# Patient Record
Sex: Female | Born: 1945 | Race: White | Hispanic: No | Marital: Married | Smoking: Never smoker
Health system: Southern US, Community
[De-identification: ages and names within clinical notes are randomized; demographics above are authoritative.]

## PROBLEM LIST (undated history)

## (undated) DIAGNOSIS — F0390 Unspecified dementia without behavioral disturbance: Secondary | ICD-10-CM

## (undated) DIAGNOSIS — I6350 Cerebral infarction due to unspecified occlusion or stenosis of unspecified cerebral artery: Secondary | ICD-10-CM

## (undated) DIAGNOSIS — Q691 Accessory thumb(s): Secondary | ICD-10-CM

## (undated) DIAGNOSIS — G40909 Epilepsy, unspecified, not intractable, without status epilepticus: Secondary | ICD-10-CM

## (undated) DIAGNOSIS — E785 Hyperlipidemia, unspecified: Secondary | ICD-10-CM

## (undated) DIAGNOSIS — R310 Gross hematuria: Secondary | ICD-10-CM

## (undated) DIAGNOSIS — G919 Hydrocephalus, unspecified: Secondary | ICD-10-CM

## (undated) DIAGNOSIS — R000 Tachycardia, unspecified: Secondary | ICD-10-CM

## (undated) DIAGNOSIS — Q759 Congenital malformation of skull and face bones, unspecified: Secondary | ICD-10-CM

## (undated) DIAGNOSIS — Z9189 Other specified personal risk factors, not elsewhere classified: Secondary | ICD-10-CM

## (undated) DIAGNOSIS — I519 Heart disease, unspecified: Secondary | ICD-10-CM

## (undated) DIAGNOSIS — E119 Type 2 diabetes mellitus without complications: Secondary | ICD-10-CM

## (undated) DIAGNOSIS — F0280 Dementia in other diseases classified elsewhere without behavioral disturbance: Secondary | ICD-10-CM

## (undated) DIAGNOSIS — Z8619 Personal history of other infectious and parasitic diseases: Secondary | ICD-10-CM

## (undated) DIAGNOSIS — Z8673 Personal history of transient ischemic attack (TIA), and cerebral infarction without residual deficits: Secondary | ICD-10-CM

## (undated) DIAGNOSIS — I456 Pre-excitation syndrome: Secondary | ICD-10-CM

## (undated) DIAGNOSIS — E871 Hypo-osmolality and hyponatremia: Secondary | ICD-10-CM

## (undated) HISTORY — DX: Personal history of other infectious and parasitic diseases: Z86.19

## (undated) HISTORY — DX: Accessory thumb(s): Q69.1

## (undated) HISTORY — DX: Congenital malformation of skull and face bones, unspecified: Q75.9

## (undated) HISTORY — DX: Pre-excitation syndrome: I45.6

## (undated) HISTORY — DX: Unspecified dementia, unspecified severity, without behavioral disturbance, psychotic disturbance, mood disturbance, and anxiety: F03.90

## (undated) HISTORY — DX: Heart disease, unspecified: I51.9

## (undated) HISTORY — DX: Other specified personal risk factors, not elsewhere classified: Z91.89

## (undated) HISTORY — DX: Type 2 diabetes mellitus without complications: E11.9

## (undated) NOTE — *Deleted (*Deleted)
Dementia Caregiver Guide Dementia is a term used to describe a number of symptoms that affect memory and thinking. The most common symptoms include:  Memory loss.  Trouble with language and communication.  Trouble concentrating.  Poor judgment.  Problems with reasoning.  Child-like behavior and language.  Extreme anxiety.  Angry outbursts.  Wandering from home or public places. Dementia usually gets worse slowly over time. In the early stages, people with dementia can stay independent and safe with some help. In later stages, they need help with daily tasks such as dressing, grooming, and using the bathroom. How to help the person with dementia cope Dementia can be frightening and confusing. Here are some tips to help the person with dementia cope with changes caused by the disease. General tips  Keep the person on track with his or her routine.  Try to identify areas where the person may need help.  Be supportive, patient, calm, and encouraging.  Gently remind the person that adjusting to changes takes time.  Help with the tasks that the person has asked for help with.  Keep the person involved in daily tasks and decisions as much as possible.  Encourage conversation, but try not to get frustrated or harried if the person struggles to find words or does not seem to appreciate your help. Communication tips  When the person is talking or seems frustrated, make eye contact and hold the person's hand.  Ask specific questions that need yes or no answers.  Use simple words, short sentences, and a calm voice. Only give one direction at a time.  When offering choices, limit them to just 1 or 2.  Avoid correcting the person in a negative way.  If the person is struggling to find the right words, gently try to help him or her. How to recognize symptoms of stress Symptoms of stress in caregivers include:  Feeling frustrated or angry with the person with dementia.   Denying that the person has dementia or that his or her symptoms will not improve.  Feeling hopeless and unappreciated.  Difficulty sleeping.  Difficulty concentrating.  Feeling anxious, irritable, or depressed.  Developing stress-related health problems.  Feeling like you have too little time for your own life. Follow these instructions at home:   Make sure that you and the person you are caring for: ? Get regular sleep. ? Exercise regularly. ? Eat regular, nutritious meals. ? Drink enough fluid to keep your urine clear or pale yellow. ? Take over-the-counter and prescription medicines only as told by your health care providers. ? Attend all scheduled health care appointments.  Join a support group with others who are caregivers.  Ask about respite care resources so that you can have a regular break from the stress of caregiving.  Look for signs of stress in yourself and in the person you are caring for. If you notice signs of stress, take steps to manage it.  Consider any safety risks and take steps to avoid them.  Organize medications in a pill box for each day of the week.  Create a plan to handle any legal or financial matters. Get legal or financial advice if needed.  Keep a calendar in a central location to remind the person of appointments or other activities. Tips for reducing the risk of injury  Keep floors clear of clutter. Remove rugs, magazine racks, and floor lamps.  Keep hallways well lit, especially at night.  Put a handrail and nonslip mat in the  bathtub or shower.  Put childproof locks on cabinets that contain dangerous items, such as medicines, alcohol, guns, toxic cleaning items, sharp tools or utensils, matches, and lighters.  Put the locks in places where the person cannot see or reach them easily. This will help ensure that the person does not wander out of the house and get lost.  Be prepared for emergencies. Keep a list of emergency phone  numbers and addresses in a convenient area.  Remove car keys and lock garage doors so that the person does not try to get in the car and drive.  Have the person wear a bracelet that tracks locations and identifies the person as having memory problems. This should be worn at all times for safety. Where to find support: Many individuals and organizations offer support. These include:  Support groups for people with dementia and for caregivers.  Counselors or therapists.  Home health care services.  Adult day care centers. Where to find more information Alzheimer[employer] Association: [employer] Contact a health care provider if:  The person's health is rapidly getting worse.  You are no longer able to care for the person.  Caring for the person is affecting your physical and emotional health.  The person threatens himself or herself, you, or anyone else. Summary  Dementia is a term used to describe a number of symptoms that affect memory and thinking.  Dementia usually gets worse slowly over time.  Take steps to reduce the person's risk of injury, and to plan for future care.  Caregivers need support, relief from caregiving, and time for their own lives. This information is not intended to replace advice given to you by your health care provider. Make sure you discuss any questions you have with your health care provider. Document Revised: 06/23/2017 Document Reviewed: 06/14/2016 Elsevier Patient Education  2020 [employer].    Seizure, Adult A seizure is a sudden burst of abnormal electrical activity in the brain. Seizures usually last from 30 seconds to 2 minutes. They can cause many different symptoms. Usually, seizures are not harmful unless they last a long time. What are the causes? Common causes of this condition include:  Fever or infection.  Conditions that affect the brain, such as: ? A brain abnormality that you were born with. ? A brain or head injury. ?  Bleeding in the brain. ? A tumor. ? Stroke. ? Brain disorders such as autism or cerebral palsy.  Low blood sugar.  Conditions that are passed from parent to child (are inherited).  Problems with substances, such as: ? Having a reaction to a drug or a medicine. ? Suddenly stopping the use of a substance (withdrawal). In some cases, the cause may not be known. A person who has repeated seizures over time without a clear cause has a condition called epilepsy. What increases the risk? You are more likely to get this condition if you have:  A family history of epilepsy.  Had a seizure in the past.  A brain disorder.  A history of head injury, lack of oxygen at birth, or strokes. What are the signs or symptoms? There are many types of seizures. The symptoms vary depending on the type of seizure you have. Examples of symptoms during a seizure include:  Shaking (convulsions).  Stiffness in the body.  Passing out (losing consciousness).  Head nodding.  Staring.  Not responding to sound or touch.  Loss of bladder control and bowel control. Some people have symptoms right before and right  after a seizure happens. Symptoms before a seizure may include:  Fear.  Worry (anxiety).  Feeling like you may vomit (nauseous).  Feeling like the room is spinning (vertigo).  Feeling like you saw or heard something before (dj vu).  Odd tastes or smells.  Changes in how you see. You may see flashing lights or spots. Symptoms after a seizure happens can include:  Confusion.  Sleepiness.  Headache.  Weakness on one side of the body. How is this treated? Most seizures will stop on their own in under 5 minutes. In these cases, no treatment is needed. Seizures that last longer than 5 minutes will usually need treatment. Treatment can include:  Medicines given through an IV tube.  Avoiding things that are known to cause your seizures. These can include medicines that you take  for another condition.  Medicines to treat epilepsy.  Surgery to stop the seizures. This may be needed if medicines do not help. Follow these instructions at home: Medicines  Take over-the-counter and prescription medicines only as told by your doctor.  Do not eat or drink anything that may keep your medicine from working, such as alcohol. Activity  Do not do any activities that would be dangerous if you had another seizure, like driving or swimming. Wait until your doctor says it is safe for you to do them.  If you live in the [place] ask your local DMV (department of motor vehicles) when you can drive.  Get plenty of rest. Teaching others Teach friends and family what to do when you have a seizure. They should:  Lay you on the ground.  Protect your head and body.  Loosen any tight clothing around your neck.  Turn you on your side.  Not hold you down.  Not put anything into your mouth.  Know whether or not you need emergency care.  Stay with you until you are better.  General instructions  Contact your doctor each time you have a seizure.  Avoid anything that gives you seizures.  Keep a seizure diary. Write down: ? What you think caused each seizure. ? What you remember about each seizure.  Keep all follow-up visits as told by your doctor. This is important. Contact a doctor if:  You have another seizure.  You have seizures more often.  There is any change in what happens during your seizures.  You keep having seizures with treatment.  You have symptoms of being sick or having an infection. Get help right away if:  You have a seizure that: ? Lasts longer than 5 minutes. ? Is different than seizures you had before. ? Makes it harder to breathe. ? Happens after you hurt your head.  You have any of these symptoms after a seizure: ? Not being able to speak. ? Not being able to use a part of your body. ? Confusion. ? A bad headache.  You have two or  more seizures in a row.  You do not wake up right after a seizure.  You get hurt during a seizure. These symptoms may be an emergency. Do not wait to see if the symptoms will go away. Get medical help right away. Call your local emergency services (911 in the [place] Do not drive yourself to the hospital. Summary  Seizures usually last from 30 seconds to 2 minutes. Usually, they are not harmful unless they last a long time.  Do not eat or drink anything that may keep your medicine from working, such as  alcohol.  Teach friends and family what to do when you have a seizure.  Contact your doctor each time you have a seizure. This information is not intended to replace advice given to you by your health care provider. Make sure you discuss any questions you have with your health care provider. Document Revised: 09/28/2018 Document Reviewed: 09/28/2018 Elsevier Patient Education  2020 [employer].

---

## 1976-07-25 HISTORY — PX: CHOLECYSTECTOMY: SHX55

## 2010-07-25 LAB — HM PAP SMEAR: HM Pap smear: NORMAL

## 2011-04-15 ENCOUNTER — Inpatient Hospital Stay (INDEPENDENT_AMBULATORY_CARE_PROVIDER_SITE_OTHER)
Admission: RE | Admit: 2011-04-15 | Discharge: 2011-04-15 | Disposition: A | Discharge status: Home / Self Care | Payer: Medicare Other | Source: Ambulatory Visit | Attending: Family Medicine | Admitting: Family Medicine

## 2011-04-15 DIAGNOSIS — S60459A Superficial foreign body of unspecified finger, initial encounter: Secondary | ICD-10-CM

## 2011-06-07 ENCOUNTER — Ambulatory Visit
Admission: RE | Admit: 2011-06-07 | Discharge: 2011-06-07 | Disposition: A | Discharge status: Home / Self Care | Payer: Medicare Other | Source: Ambulatory Visit | Attending: Internal Medicine | Admitting: Internal Medicine

## 2011-06-07 ENCOUNTER — Other Ambulatory Visit: Payer: Self-pay | Admitting: Internal Medicine

## 2011-06-07 DIAGNOSIS — R0781 Pleurodynia: Secondary | ICD-10-CM

## 2012-01-23 ENCOUNTER — Other Ambulatory Visit: Payer: Self-pay | Admitting: Internal Medicine

## 2012-01-23 DIAGNOSIS — Z1231 Encounter for screening mammogram for malignant neoplasm of breast: Secondary | ICD-10-CM

## 2012-01-23 LAB — HM MAMMOGRAPHY: HM Mammogram: NORMAL

## 2012-02-02 ENCOUNTER — Ambulatory Visit
Admission: RE | Admit: 2012-02-02 | Discharge: 2012-02-02 | Disposition: A | Discharge status: Home / Self Care | Payer: Medicare Other | Source: Ambulatory Visit | Attending: Internal Medicine | Admitting: Internal Medicine

## 2012-02-02 DIAGNOSIS — Z1231 Encounter for screening mammogram for malignant neoplasm of breast: Secondary | ICD-10-CM

## 2012-03-20 ENCOUNTER — Other Ambulatory Visit: Payer: Self-pay | Admitting: Internal Medicine

## 2012-03-20 DIAGNOSIS — W19XXXA Unspecified fall, initial encounter: Secondary | ICD-10-CM

## 2012-03-22 ENCOUNTER — Ambulatory Visit
Admission: RE | Admit: 2012-03-22 | Discharge: 2012-03-22 | Disposition: A | Discharge status: Home / Self Care | Payer: Medicare Other | Source: Ambulatory Visit | Attending: Internal Medicine | Admitting: Internal Medicine

## 2012-03-22 DIAGNOSIS — W19XXXA Unspecified fall, initial encounter: Secondary | ICD-10-CM

## 2012-05-08 ENCOUNTER — Other Ambulatory Visit: Payer: Self-pay | Admitting: Neurosurgery

## 2012-05-08 DIAGNOSIS — G919 Hydrocephalus, unspecified: Secondary | ICD-10-CM

## 2012-05-14 ENCOUNTER — Ambulatory Visit
Admission: RE | Admit: 2012-05-14 | Discharge: 2012-05-14 | Disposition: A | Discharge status: Home / Self Care | Payer: Medicare Other | Source: Ambulatory Visit | Attending: Neurosurgery | Admitting: Neurosurgery

## 2012-05-14 DIAGNOSIS — G919 Hydrocephalus, unspecified: Secondary | ICD-10-CM

## 2013-02-28 ENCOUNTER — Other Ambulatory Visit: Payer: Self-pay | Admitting: Internal Medicine

## 2013-02-28 DIAGNOSIS — Z1231 Encounter for screening mammogram for malignant neoplasm of breast: Secondary | ICD-10-CM

## 2013-03-21 ENCOUNTER — Ambulatory Visit: Payer: Medicare Other

## 2013-05-03 ENCOUNTER — Ambulatory Visit: Payer: Medicare Other

## 2013-07-05 ENCOUNTER — Emergency Department (HOSPITAL_COMMUNITY)
Admission: EM | Admit: 2013-07-05 | Discharge: 2013-07-05 | Disposition: A | Discharge status: Home / Self Care | Payer: Medicare PPO | Attending: Emergency Medicine | Admitting: Emergency Medicine

## 2013-07-05 ENCOUNTER — Encounter (HOSPITAL_COMMUNITY): Payer: Self-pay | Admitting: Emergency Medicine

## 2013-07-05 ENCOUNTER — Emergency Department (HOSPITAL_COMMUNITY): Payer: Medicare PPO

## 2013-07-05 DIAGNOSIS — Y92410 Unspecified street and highway as the place of occurrence of the external cause: Secondary | ICD-10-CM | POA: Diagnosis not present

## 2013-07-05 DIAGNOSIS — M549 Dorsalgia, unspecified: Secondary | ICD-10-CM | POA: Insufficient documentation

## 2013-07-05 DIAGNOSIS — IMO0002 Reserved for concepts with insufficient information to code with codable children: Secondary | ICD-10-CM | POA: Diagnosis present

## 2013-07-05 DIAGNOSIS — Y9389 Activity, other specified: Secondary | ICD-10-CM | POA: Insufficient documentation

## 2013-07-05 DIAGNOSIS — R0789 Other chest pain: Secondary | ICD-10-CM

## 2013-07-05 HISTORY — DX: Tachycardia, unspecified: R00.0

## 2013-07-05 HISTORY — DX: Hydrocephalus, unspecified: G91.9

## 2013-07-05 MED ORDER — OXYCODONE-ACETAMINOPHEN 5-325 MG PO TABS
1.0000 | ORAL_TABLET | Freq: Once | ORAL | Status: AC
  Administered 2013-07-05: 1 via ORAL
  Filled 2013-07-05: qty 1

## 2013-07-05 MED ORDER — DIAZEPAM 2 MG PO TABS
2.0000 mg | ORAL_TABLET | Freq: Once | ORAL | Status: AC
  Administered 2013-07-05: 2 mg via ORAL
  Filled 2013-07-05: qty 1

## 2013-07-05 NOTE — ED Notes (Signed)
The tech ambulated the patient (1x) assist. The patient gait was steady. The patient c/o of pain in the right side and back; while ambulating. The tech has reported to the RN in charge.
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
## 2013-07-06 NOTE — ED Provider Notes (Signed)
Medical screening examination/treatment/procedure(s) were performed by non-physician practitioner and as supervising physician I was immediately available for consultation/collaboration.  EKG Interpretation   None         Candyce Churn, MD 07/06/13 1215

## 2013-07-09 ENCOUNTER — Ambulatory Visit: Payer: Medicare Other

## 2013-08-04 ENCOUNTER — Observation Stay (HOSPITAL_COMMUNITY): Payer: Medicare PPO

## 2013-08-04 ENCOUNTER — Inpatient Hospital Stay (HOSPITAL_COMMUNITY)
Admission: EM | Admit: 2013-08-04 | Discharge: 2013-08-06 | DRG: 101 | Disposition: A | Discharge status: Home / Self Care | Payer: Medicare PPO | Attending: Internal Medicine | Admitting: Internal Medicine

## 2013-08-04 ENCOUNTER — Encounter (HOSPITAL_COMMUNITY): Payer: Self-pay | Admitting: Emergency Medicine

## 2013-08-04 ENCOUNTER — Emergency Department (HOSPITAL_COMMUNITY): Payer: Medicare PPO

## 2013-08-04 DIAGNOSIS — E785 Hyperlipidemia, unspecified: Secondary | ICD-10-CM

## 2013-08-04 DIAGNOSIS — G40909 Epilepsy, unspecified, not intractable, without status epilepticus: Secondary | ICD-10-CM | POA: Diagnosis present

## 2013-08-04 DIAGNOSIS — Q039 Congenital hydrocephalus, unspecified: Secondary | ICD-10-CM

## 2013-08-04 DIAGNOSIS — E119 Type 2 diabetes mellitus without complications: Secondary | ICD-10-CM

## 2013-08-04 DIAGNOSIS — I2510 Atherosclerotic heart disease of native coronary artery without angina pectoris: Secondary | ICD-10-CM | POA: Diagnosis present

## 2013-08-04 DIAGNOSIS — E1169 Type 2 diabetes mellitus with other specified complication: Secondary | ICD-10-CM

## 2013-08-04 DIAGNOSIS — E669 Obesity, unspecified: Secondary | ICD-10-CM | POA: Diagnosis present

## 2013-08-04 DIAGNOSIS — I10 Essential (primary) hypertension: Secondary | ICD-10-CM | POA: Diagnosis present

## 2013-08-04 DIAGNOSIS — E871 Hypo-osmolality and hyponatremia: Secondary | ICD-10-CM | POA: Diagnosis present

## 2013-08-04 DIAGNOSIS — Z8249 Family history of ischemic heart disease and other diseases of the circulatory system: Secondary | ICD-10-CM

## 2013-08-04 DIAGNOSIS — R569 Unspecified convulsions: Principal | ICD-10-CM | POA: Diagnosis present

## 2013-08-04 HISTORY — DX: Epilepsy, unspecified, not intractable, without status epilepticus: G40.909

## 2013-08-04 HISTORY — DX: Hyperlipidemia, unspecified: E78.5

## 2013-08-04 HISTORY — DX: Type 2 diabetes mellitus without complications: E11.9

## 2013-08-04 LAB — CBC
HCT: 40.8 % (ref 36.0–46.0)
Hemoglobin: 13.7 g/dL (ref 12.0–15.0)
MCH: 29.5 pg (ref 26.0–34.0)
MCHC: 33.6 g/dL (ref 30.0–36.0)
MCV: 87.9 fL (ref 78.0–100.0)
Platelets: 296 10*3/uL (ref 150–400)
RBC: 4.64 MIL/uL (ref 3.87–5.11)
RDW: 12.6 % (ref 11.5–15.5)
WBC: 8.6 10*3/uL (ref 4.0–10.5)

## 2013-08-04 LAB — URINALYSIS W MICROSCOPIC + REFLEX CULTURE
Bilirubin Urine: NEGATIVE
Glucose, UA: NEGATIVE mg/dL
Hgb urine dipstick: NEGATIVE
Ketones, ur: NEGATIVE mg/dL
Leukocytes, UA: NEGATIVE
Nitrite: NEGATIVE
Protein, ur: NEGATIVE mg/dL
Specific Gravity, Urine: 1.01 (ref 1.005–1.030)
Urine-Other: NONE SEEN
Urobilinogen, UA: 0.2 mg/dL (ref 0.0–1.0)
pH: 6.5 (ref 5.0–8.0)

## 2013-08-04 LAB — GLUCOSE, CAPILLARY
Glucose-Capillary: 97 mg/dL (ref 70–99)
Glucose-Capillary: 98 mg/dL (ref 70–99)

## 2013-08-04 LAB — COMPREHENSIVE METABOLIC PANEL
ALT: 31 U/L (ref 0–35)
AST: 25 U/L (ref 0–37)
Albumin: 4.6 g/dL (ref 3.5–5.2)
Alkaline Phosphatase: 84 U/L (ref 39–117)
BUN: 9 mg/dL (ref 6–23)
CO2: 21 mEq/L (ref 19–32)
Calcium: 9.8 mg/dL (ref 8.4–10.5)
Chloride: 93 mEq/L — ABNORMAL LOW (ref 96–112)
Creatinine, Ser: 0.72 mg/dL (ref 0.50–1.10)
GFR calc Af Amer: >90 mL/min (ref >90)
GFR calc non Af Amer: 86 mL/min — ABNORMAL LOW (ref >90)
Glucose, Bld: 109 mg/dL — ABNORMAL HIGH (ref 70–99)
Potassium: 4.4 mEq/L (ref 3.7–5.3)
Sodium: 134 mEq/L — ABNORMAL LOW (ref 137–147)
Total Bilirubin: 0.4 mg/dL (ref 0.3–1.2)
Total Protein: 8 g/dL (ref 6.0–8.3)

## 2013-08-04 LAB — DIGOXIN LEVEL: Digoxin Level: 1 ng/mL (ref 0.8–2.0)

## 2013-08-04 MED ORDER — ONDANSETRON HCL 4 MG PO TABS: 4.0000 mg | ORAL_TABLET | Freq: Four times a day (QID) | ORAL | Status: DC | PRN

## 2013-08-04 MED ORDER — ACETAMINOPHEN 650 MG RE SUPP: 650.0000 mg | Freq: Four times a day (QID) | RECTAL | Status: DC | PRN

## 2013-08-04 MED ORDER — SODIUM CHLORIDE 0.9 % IV BOLUS (SEPSIS)
500.0000 mL | Freq: Once | INTRAVENOUS | Status: AC
  Administered 2013-08-04: 500 mL via INTRAVENOUS

## 2013-08-04 MED ORDER — PANTOPRAZOLE SODIUM 40 MG PO TBEC: 40.0000 mg | DELAYED_RELEASE_TABLET | Freq: Every day | ORAL | Status: DC

## 2013-08-04 MED ORDER — MIRABEGRON ER 50 MG PO TB24
50.0000 mg | ORAL_TABLET | Freq: Every day | ORAL | Status: DC
  Administered 2013-08-05: 50 mg via ORAL
  Filled 2013-08-04 (×3): qty 1

## 2013-08-04 MED ORDER — LEVETIRACETAM 500 MG PO TABS
500.0000 mg | ORAL_TABLET | Freq: Two times a day (BID) | ORAL | Status: DC
  Filled 2013-08-04: qty 1

## 2013-08-04 MED ORDER — ATORVASTATIN CALCIUM 40 MG PO TABS
40.0000 mg | ORAL_TABLET | Freq: Every evening | ORAL | Status: DC
  Administered 2013-08-05: 40 mg via ORAL
  Filled 2013-08-04 (×3): qty 1

## 2013-08-04 MED ORDER — ONDANSETRON HCL 4 MG/2ML IJ SOLN: 4.0000 mg | Freq: Four times a day (QID) | INTRAMUSCULAR | Status: DC | PRN

## 2013-08-04 MED ORDER — SODIUM CHLORIDE 0.9 % IJ SOLN
3.0000 mL | Freq: Two times a day (BID) | INTRAMUSCULAR | Status: DC
  Administered 2013-08-04 – 2013-08-06 (×4): 3 mL via INTRAVENOUS

## 2013-08-04 MED ORDER — SODIUM CHLORIDE 0.9 % IV SOLN
1000.0000 mg | Freq: Once | INTRAVENOUS | Status: AC
  Administered 2013-08-04: 1000 mg via INTRAVENOUS
  Filled 2013-08-04: qty 10

## 2013-08-04 MED ORDER — SODIUM CHLORIDE 0.9 % IV SOLN
500.0000 mg | Freq: Two times a day (BID) | INTRAVENOUS | Status: DC
  Administered 2013-08-04 – 2013-08-05 (×2): 500 mg via INTRAVENOUS
  Filled 2013-08-04 (×3): qty 5

## 2013-08-04 MED ORDER — POLYETHYLENE GLYCOL 3350 17 G PO PACK
17.0000 g | PACK | Freq: Every day | ORAL | Status: DC | PRN
  Filled 2013-08-04: qty 1

## 2013-08-04 MED ORDER — PANTOPRAZOLE SODIUM 40 MG IV SOLR
40.0000 mg | Freq: Every day | INTRAVENOUS | Status: DC
  Administered 2013-08-04: 40 mg via INTRAVENOUS
  Filled 2013-08-04 (×3): qty 40

## 2013-08-04 MED ORDER — ACETAMINOPHEN 325 MG PO TABS
650.0000 mg | ORAL_TABLET | Freq: Four times a day (QID) | ORAL | Status: DC | PRN
Start: 2013-08-04 — End: 2013-08-06

## 2013-08-04 MED ORDER — LISINOPRIL 20 MG PO TABS
20.0000 mg | ORAL_TABLET | Freq: Every day | ORAL | Status: DC
  Administered 2013-08-05 – 2013-08-06 (×2): 20 mg via ORAL
  Filled 2013-08-04 (×2): qty 1

## 2013-08-04 MED ORDER — FESOTERODINE FUMARATE ER 8 MG PO TB24
8.0000 mg | ORAL_TABLET | Freq: Every day | ORAL | Status: DC
  Administered 2013-08-05: 8 mg via ORAL
  Filled 2013-08-04 (×3): qty 1

## 2013-08-04 MED ORDER — PROPRANOLOL HCL 10 MG PO TABS
10.0000 mg | ORAL_TABLET | Freq: Two times a day (BID) | ORAL | Status: DC
  Administered 2013-08-05 – 2013-08-06 (×3): 10 mg via ORAL
  Filled 2013-08-04 (×5): qty 1

## 2013-08-04 MED ORDER — DIGOXIN 0.25 MG/ML IJ SOLN
0.1250 mg | Freq: Every day | INTRAMUSCULAR | Status: DC
  Filled 2013-08-04: qty 0.5

## 2013-08-04 MED ORDER — DIGOXIN 125 MCG PO TABS: 0.1250 mg | ORAL_TABLET | Freq: Every day | ORAL | Status: DC

## 2013-08-04 MED ORDER — LORAZEPAM 2 MG/ML IJ SOLN
1.0000 mg | Freq: Once | INTRAMUSCULAR | Status: AC
  Administered 2013-08-04: 1 mg via INTRAVENOUS

## 2013-08-04 MED ORDER — METFORMIN HCL 500 MG PO TABS
500.0000 mg | ORAL_TABLET | Freq: Every day | ORAL | Status: DC
  Filled 2013-08-04 (×2): qty 1

## 2013-08-04 NOTE — ED Notes (Signed)
[place]  Witnesses state pt began shaking like she was having a seizure and then had a syncopal episode.  Pt appears postictal.  Pt it verbal with inappropriate responses.  EMS reports pt initially not responding to them.  EMS reports pt was stiff and had eyes clinched closed.
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
## 2013-08-04 NOTE — ED Provider Notes (Signed)
CSN: [id number]     Arrival date & time 08/04/13  1046 History   First MD Initiated Contact with Patient 08/04/13 1102     Chief Complaint  Patient presents with  . Loss of Consciousness  . Seizures   (Consider location/radiation/quality/duration/timing/severity/associated sxs/prior Treatment) Patient is a 68 y.o. female presenting with syncope and seizures. The history is provided by the patient.  Loss of Consciousness Episode history:  Multiple Most recent episode:  Today Timing:  Intermittent Progression:  Unchanged Context comment:  At church Witnessed: yes   Relieved by:  Nothing Worsened by:  Nothing tried Ineffective treatments:  None tried Associated symptoms: seizures   Associated symptoms: no chest pain, no dizziness, no fever, no headaches, no nausea, no shortness of breath and no vomiting   Seizures:    Seizure activity on arrival: yes     Seizure type:  Grand mal   Severity:  Mild   Duration:  2 minutes ( seizure here, uknown duration of seizure at church)   Timing:  Once Seizures   Past Medical History  Diagnosis Date  . Hydrocephalus   . Tachycardia    Past Surgical History  Procedure Laterality Date  . Cholecystectomy  1978   No family history on file. History  Substance Use Topics  . Smoking status: Not on file  . Smokeless tobacco: Not on file  . Alcohol Use: Not on file   OB History   Grav Para Term Preterm Abortions TAB SAB Ect Mult Living                 Review of Systems  Constitutional: Negative for fever and fatigue.  HENT: Negative for congestion and drooling.   Eyes: Negative for pain.  Respiratory: Negative for cough and shortness of breath.   Cardiovascular: Positive for syncope. Negative for chest pain.  Gastrointestinal: Negative for nausea, vomiting, abdominal pain and diarrhea.  Genitourinary: Negative for dysuria and hematuria.  Musculoskeletal: Negative for back pain, gait problem and neck pain.  Skin: Negative for  color change.  Neurological: Positive for seizures. Negative for dizziness and headaches.  Hematological: Negative for adenopathy.  Psychiatric/Behavioral: Negative for behavioral problems.  All other systems reviewed and are negative.    Allergies  Review of patient's allergies indicates no known allergies.  Home Medications   Current Outpatient Rx  Name  Route  Sig  Dispense  Refill  . atorvastatin (LIPITOR) 40 MG tablet               . DIGOX 125 MCG tablet               . lidocaine (XYLOCAINE) 5 % ointment               . lisinopril (PRINIVIL,ZESTRIL) 20 MG tablet               . metFORMIN (GLUCOPHAGE) 500 MG tablet               . [name] 50 MG TB24 tablet               . propranolol (INDERAL) 10 MG tablet               . [name] 8 MG TB24 tablet                BP 144/48  Pulse 85  Temp(Src) 98.5 F (36.9 C) (Axillary)  Resp 13  SpO2 95% Physical Exam  Nursing note and vitals reviewed. Constitutional: She  appears well-developed and well-nourished.  HENT:  Head: Normocephalic.  Mouth/Throat: Oropharynx is clear and moist. No oropharyngeal exudate.  Eyes: Conjunctivae and EOM are normal. Pupils are equal, round, and reactive to light.  Neck: Normal range of motion. Neck supple.  Cardiovascular: Normal rate, regular rhythm, normal heart sounds and intact distal pulses.  Exam reveals no gallop and no friction rub.   No murmur heard. Pulmonary/Chest: Effort normal and breath sounds normal. No respiratory distress. She has no wheezes.  Abdominal: Soft. Bowel sounds are normal. There is no tenderness. There is no rebound and no guarding.  Musculoskeletal: Normal range of motion. She exhibits no edema and no tenderness.  6 digits on right hand.   Neurological:  Patient is drowsy on exam and will not follow commands. She is confused verbal responses. She appears to be moving all extremities.  Skin: Skin is warm and dry.  Psychiatric:  She has a normal mood and affect. Her behavior is normal.    ED Course  Procedures (including critical care time) Labs Review Labs Reviewed  COMPREHENSIVE METABOLIC PANEL - Abnormal; Notable for the following:    Sodium 134 (*)    Chloride 93 (*)    Glucose, Bld 109 (*)    GFR calc non Af Amer 86 (*)    All other components within normal limits  CBC - Abnormal; Notable for the following:    HCT 35.1 (*)    All other components within normal limits  BASIC METABOLIC PANEL - Abnormal; Notable for the following:    GFR calc non Af Amer 89 (*)    All other components within normal limits  CBC  DIGOXIN LEVEL  URINALYSIS W MICROSCOPIC + REFLEX CULTURE  GLUCOSE, CAPILLARY  TSH  GLUCOSE, CAPILLARY  GLUCOSE, CAPILLARY  GLUCOSE, CAPILLARY  HEMOGLOBIN A1C   Imaging Review Ct Head Wo Contrast  08/04/2013   CLINICAL DATA:  68 year old female with seizure and syncope.  EXAM: CT HEAD WITHOUT CONTRAST  TECHNIQUE: Contiguous axial images were obtained from the base of the skull through the vertex without intravenous contrast.  COMPARISON:  05/14/2012 MR and 03/22/2012 CT.  FINDINGS: Severe chronic hydrocephalus of the lateral and 3rd ventricles again noted. Chronic compression of the cerebral hemispheric tissue is again identified.  No acute intracranial abnormalities are identified, including mass lesion or mass effect, extra-axial fluid collection, midline shift, hemorrhage, or acute infarction. The visualized bony calvarium is unremarkable.  IMPRESSION: No evidence of acute intracranial abnormality.  Severe chronic hydrocephalus.   Electronically Signed   By: Laveda Abbe M.D.   On: 08/04/2013 11:56   Dg Chest Port 1 View  08/04/2013   CLINICAL DATA:  Possible seizure, syncope  EXAM: PORTABLE CHEST - 1 VIEW  COMPARISON:  07/05/2013  FINDINGS: Cardiomediastinal silhouette is stable. No acute infiltrate or pleural effusion. No pulmonary edema. Mild basilar atelectasis.  IMPRESSION: No acute infiltrate  or pulmonary edema.  Mild basilar atelectasis.   Electronically Signed   By: Natasha Mead M.D.   On: 08/04/2013 12:13    EKG Interpretation    Date/Time:  Sunday August 04 2013 13:05:47 EST Ventricular Rate:  100 PR Interval:  168 QRS Duration: 88 QT Interval:  288 QTC Calculation: 371 R Axis:   54 Text Interpretation:  Age not entered, assumed to be  68 years old for purpose of ECG interpretation Sinus tachycardia Inferior infarct, age indeterminate Confirmed by Daja Shuping  MD, Lashon Beringer ([id number]) on 08/04/2013 1:24:33 PM  MDM   1. Seizures   2. Congenital hydrocephalus   3. Diabetes mellitus type 2 in obese   4. Hyperlipidemia   5. Hypertension    11:20 AM 68 y.o. female with a history of hydrocephalus who presents with a seizure and possible syncopal episode. History is currently from EMS is reporting the patient had a shaking episode at church consistent with a seizure and then had a syncopal episode. EMS is reporting that the patient was postictal in route. Upon arrival she had a diffuse tonic-clonic seizure lasting less than 2 minutes which was seen by me. She got 1 mg of Ativan at that time. More detailed history is currently lacking and reportedly family members are in route. The patient is currently afebrile and vital signs are unremarkable here. The patient appears postictal on exam with confused verbal responses. She appears drowsy. Will get screening imaging and labs.  1:41 PM I discussed the case with Dr. Roseanne RenoStewart with neurology. The patient is now more active and has no focal motor or sensory deficits. She remained slightly drowsy on exam.  2:36 PM Patient continues to appear well on exam. She has some persistent perseveration which is noted by the husband. She is requesting water. Will have the RN perform a swallow screen first. [facility] and MRI.  Junius ArgyleForrest S Macyn Remmert, MD 08/05/13 1123

## 2013-08-04 NOTE — ED Notes (Signed)
CBG 97 

## 2013-08-04 NOTE — Progress Notes (Signed)
Received pt from ED approximated at 1825pm accompanied by NT in stretcher, situated pt in her room, alert and verbally responsive in no acute distress. Per Aquilla SolianGina Gauge,RN MRI hasnt been done, MD was aware and for a neurology consult, Per ED staff pt failed a swallowing test in ED.

## 2013-08-05 ENCOUNTER — Observation Stay (HOSPITAL_COMMUNITY): Payer: Medicare PPO

## 2013-08-05 LAB — BASIC METABOLIC PANEL
BUN: 8 mg/dL (ref 6–23)
CO2: 24 mEq/L (ref 19–32)
Calcium: 8.9 mg/dL (ref 8.4–10.5)
Chloride: 102 mEq/L (ref 96–112)
Creatinine, Ser: 0.66 mg/dL (ref 0.50–1.10)
GFR calc Af Amer: >90 mL/min (ref >90)
GFR calc non Af Amer: 89 mL/min — ABNORMAL LOW (ref >90)
Glucose, Bld: 97 mg/dL (ref 70–99)
Potassium: 3.8 mEq/L (ref 3.7–5.3)
Sodium: 138 mEq/L (ref 137–147)

## 2013-08-05 LAB — CBC
HCT: 35.1 % — ABNORMAL LOW (ref 36.0–46.0)
Hemoglobin: 12.3 g/dL (ref 12.0–15.0)
MCH: 29.6 pg (ref 26.0–34.0)
MCHC: 35 g/dL (ref 30.0–36.0)
MCV: 84.4 fL (ref 78.0–100.0)
Platelets: 212 10*3/uL (ref 150–400)
RBC: 4.16 MIL/uL (ref 3.87–5.11)
RDW: 12.6 % (ref 11.5–15.5)
WBC: 4.7 10*3/uL (ref 4.0–10.5)

## 2013-08-05 LAB — GLUCOSE, CAPILLARY
Glucose-Capillary: 93 mg/dL (ref 70–99)
Glucose-Capillary: 87 mg/dL (ref 70–99)
Glucose-Capillary: 154 mg/dL — ABNORMAL HIGH (ref 70–99)
Glucose-Capillary: 171 mg/dL — ABNORMAL HIGH (ref 70–99)
Glucose-Capillary: 147 mg/dL — ABNORMAL HIGH (ref 70–99)

## 2013-08-05 LAB — HEMOGLOBIN A1C
Hgb A1c MFr Bld: 6.7 % — ABNORMAL HIGH (ref <5.7)
Mean Plasma Glucose: 146 mg/dL — ABNORMAL HIGH (ref <117)

## 2013-08-05 LAB — TSH: TSH: 1.721 u[IU]/mL (ref 0.350–4.500)

## 2013-08-05 MED ORDER — LEVETIRACETAM 500 MG PO TABS
500.0000 mg | ORAL_TABLET | Freq: Two times a day (BID) | ORAL | Status: DC
Start: 2013-08-05 — End: 2013-08-06
  Administered 2013-08-05 – 2013-08-06 (×2): 500 mg via ORAL
  Filled 2013-08-05 (×3): qty 1

## 2013-08-05 MED ORDER — DIGOXIN 125 MCG PO TABS
0.1250 mg | ORAL_TABLET | Freq: Every day | ORAL | Status: DC
  Administered 2013-08-05 – 2013-08-06 (×2): 0.125 mg via ORAL
  Filled 2013-08-05 (×2): qty 1

## 2013-08-05 MED ORDER — INSULIN ASPART 100 UNIT/ML ~~LOC~~ SOLN
0.0000 [IU] | Freq: Three times a day (TID) | SUBCUTANEOUS | Status: DC
  Administered 2013-08-05 (×2): 2 [IU] via SUBCUTANEOUS
  Administered 2013-08-06: 1 [IU] via SUBCUTANEOUS

## 2013-08-05 MED ORDER — ENOXAPARIN SODIUM 40 MG/0.4ML ~~LOC~~ SOLN
40.0000 mg | SUBCUTANEOUS | Status: DC
  Administered 2013-08-05: 40 mg via SUBCUTANEOUS
  Filled 2013-08-05 (×2): qty 0.4

## 2013-08-05 MED ORDER — PANTOPRAZOLE SODIUM 40 MG PO TBEC
40.0000 mg | DELAYED_RELEASE_TABLET | Freq: Every day | ORAL | Status: DC
  Administered 2013-08-05 – 2013-08-06 (×2): 40 mg via ORAL
  Filled 2013-08-05 (×2): qty 1

## 2013-08-05 NOTE — Progress Notes (Signed)
EEG Completed; Results Pending  

## 2013-08-05 NOTE — Progress Notes (Signed)
PATIENT DETAILS Name: Leah Payne Age: 68 y.o. Sex: female Date of Birth: [birth year] Admit Date: 08/04/2013 Admitting Physician Elby Showersatherine Walsh, MD PCP:No primary provider on file.  Subjective: Completely awake and alert, does not recollect events from admission.  Assessment/Plan: Active Problems:   New onset Seizures - On admission, started on intravenous Keppra - Neurology following - Await MRI brain and EEG    Congenital hydrocephalus - Further workup in the outpatient setting    Hypertension - Controlled with lisinopril and propranolol    Diabetes mellitus type 2 in obese - CBGs stable, continue SSI - Resume metformin on discharge    Hyperlipidemia - Continue statins  Disposition: Remain inpatient  DVT Prophylaxis: Prophylactic Lovenox   Code Status: Full code   Family Communication Family friend at bedside  Procedures:  None  CONSULTS:  neurology  Time spent 40 minutes-which includes 50% of the time with face-to-face with patient and coordinating care related to the above assessment and plan.  MEDICATIONS: Scheduled Meds: . atorvastatin  40 mg Oral QPM  . digoxin  0.125 mg Oral Daily  . fesoterodine  8 mg Oral QHS  . insulin aspart  0-9 Units Subcutaneous TID WC  . levETIRAcetam  500 mg Oral BID  . lisinopril  20 mg Oral Daily  . mirabegron ER  50 mg Oral QHS  . pantoprazole  40 mg Oral Daily  . propranolol  10 mg Oral BID  . sodium chloride  3 mL Intravenous Q12H   Continuous Infusions:  PRN Meds:.acetaminophen, acetaminophen, ondansetron (ZOFRAN) IV, ondansetron, polyethylene glycol  Antibiotics: Anti-infectives   None       PHYSICAL EXAM: Vital signs in last 24 hours: Filed Vitals:   08/04/13 2124 08/05/13 0133 08/05/13 0558 08/05/13 0947  BP: 122/65 105/50 132/58 127/66  Pulse: 88 84 84 88  Temp: 98.1 F (36.7 C) 97.5 F (36.4 C) 98.2 F (36.8 C) 98 F (36.7 C)  TempSrc: Oral Oral Oral Oral  Resp: 18 16 16  16   Height:      Weight:      SpO2: 99% 98% 100% 99%    Weight change:  Filed Weights   08/04/13 1850  Weight: 66.225 kg (146 lb)   Body mass index is 26.7 kg/(m^2).   Gen Exam: Awake and alert with clear speech.   Neck: Supple, No JVD.   Chest: B/L Clear.   CVS: S1 S2 Regular, no murmurs.  Abdomen: soft, BS +, non tender, non distended.  Extremities: no edema, lower extremities warm to touch. Neurologic: Non Focal.   Skin: No Rash.   Wounds: N/A.    Intake/Output from previous day: No intake or output data in the 24 hours ending 08/05/13 1443   LAB RESULTS: CBC  Recent Labs Lab 08/04/13 1050 08/05/13 0350  WBC 8.6 4.7  HGB 13.7 12.3  HCT 40.8 35.1*  PLT 296 212  MCV 87.9 84.4  MCH 29.5 29.6  MCHC 33.6 35.0  RDW 12.6 12.6    Chemistries   Recent Labs Lab 08/04/13 1050 08/05/13 0350  NA 134* 138  K 4.4 3.8  CL 93* 102  CO2 21 24  GLUCOSE 109* 97  BUN 9 8  CREATININE 0.72 0.66  CALCIUM 9.8 8.9    CBG:  Recent Labs Lab 08/04/13 1120 08/04/13 2318 08/05/13 0705 08/05/13 0735 08/05/13 1137  GLUCAP 97 98 93 87 154*    GFR Estimated Creatinine Clearance: 60 ml/min (by C-G formula based on Cr of 0.66).  Coagulation profile No results found for this basename: INR, PROTIME,  in the last 168 hours  Cardiac Enzymes No results found for this basename: CK, CKMB, TROPONINI, MYOGLOBIN,  in the last 168 hours  No components found with this basename: POCBNP,  No results found for this basename: DDIMER,  in the last 72 hours  Recent Labs  08/05/13 0350  HGBA1C 6.7*   No results found for this basename: CHOL, HDL, LDLCALC, TRIG, CHOLHDL, LDLDIRECT,  in the last 72 hours  Recent Labs  08/05/13 0350  TSH 1.721   No results found for this basename: VITAMINB12, FOLATE, FERRITIN, TIBC, IRON, RETICCTPCT,  in the last 72 hours No results found for this basename: LIPASE, AMYLASE,  in the last 72 hours  Urine Studies No results found for this  basename: UACOL, UAPR, USPG, UPH, UTP, UGL, UKET, UBIL, UHGB, UNIT, UROB, ULEU, UEPI, UWBC, URBC, UBAC, CAST, CRYS, UCOM, BILUA,  in the last 72 hours  MICROBIOLOGY: No results found for this or any previous visit (from the past 240 hour(s)).  RADIOLOGY STUDIES/RESULTS: Ct Head Wo Contrast  08/04/2013   CLINICAL DATA:  68 year old female with seizure and syncope.  EXAM: CT HEAD WITHOUT CONTRAST  TECHNIQUE: Contiguous axial images were obtained from the base of the skull through the vertex without intravenous contrast.  COMPARISON:  05/14/2012 MR and 03/22/2012 CT.  FINDINGS: Severe chronic hydrocephalus of the lateral and 3rd ventricles again noted. Chronic compression of the cerebral hemispheric tissue is again identified.  No acute intracranial abnormalities are identified, including mass lesion or mass effect, extra-axial fluid collection, midline shift, hemorrhage, or acute infarction. The visualized bony calvarium is unremarkable.  IMPRESSION: No evidence of acute intracranial abnormality.  Severe chronic hydrocephalus.   Electronically Signed   By: Laveda Abbe M.D.   On: 08/04/2013 11:56   Dg Chest Port 1 View  08/04/2013   CLINICAL DATA:  Possible seizure, syncope  EXAM: PORTABLE CHEST - 1 VIEW  COMPARISON:  07/05/2013  FINDINGS: Cardiomediastinal silhouette is stable. No acute infiltrate or pleural effusion. No pulmonary edema. Mild basilar atelectasis.  IMPRESSION: No acute infiltrate or pulmonary edema.  Mild basilar atelectasis.   Electronically Signed   By: Natasha Mead M.D.   On: 08/04/2013 12:13    Jeoffrey Massed, MD  [facility] Pager:[phone]  If 7PM-7AM, please contact night-coverage [email or web address] Password [email or web address] 08/05/2013, 2:43 PM   LOS: 1 day

## 2013-08-05 NOTE — Progress Notes (Signed)
NEURO HOSPITALIST PROGRESS NOTE   SUBJECTIVE:                                                                                                                        No further seizures over night  OBJECTIVE:                                                                                                                           Vital signs in last 24 hours: Temp:  [97.5 F (36.4 C)-98.3 F (36.8 C)] 98 F (36.7 C) (01/12 0947) Pulse Rate:  [84-96] 88 (01/12 0947) Resp:  [16-20] 16 (01/12 0947) BP: (105-157)/(50-80) 127/66 mmHg (01/12 0947) SpO2:  [98 %-100 %] 99 % (01/12 0947) Weight:  [66.225 kg (146 lb)] 66.225 kg (146 lb) (01/11 1850)  Intake/Output from previous day:   Intake/Output this shift:   Nutritional status: General  Past Medical History  Diagnosis Date  . Hydrocephalus   . Tachycardia      Neurologic Exam:   Mental Status: Alert, oriented, thought content appropriate.  Speech fluent without evidence of aphasia.  Able to follow 3 step commands without difficulty. Cranial Nerves: II:  Visual fields grossly normal, pupils equal, round, reactive to light and accommodation III,IV, VI: ptosis not present, extra-ocular motions intact bilaterally V,VII: smile symmetric, facial light touch sensation normal bilaterally VIII: hearing normal bilaterally IX,X: gag reflex present XI: bilateral shoulder shrug XII: midline tongue extension without atrophy or fasciculations  Motor: Right : Upper extremity   5/5    Left:     Upper extremity   5/5  Lower extremity   5/5     Lower extremity   5/5 Tone and bulk:normal tone throughout; no atrophy noted Sensory: Pinprick and light touch intact throughout, bilaterally Deep Tendon Reflexes:  Right: Upper Extremity   Left: Upper extremity   biceps (C-5 to C-6) 2/4   biceps (C-5 to C-6) 2/4 tricep (C7) 2/4    triceps (C7) 2/4 Brachioradialis (C6) 2/4  Brachioradialis (C6) 2/4  Lower  Extremity Lower Extremity  quadriceps (L-2 to L-4) 2/4   quadriceps (L-2 to L-4) 2/4 Achilles (S1) 1/4   Achilles (S1) 1/4  Plantars: Right: downgoing   Left: mute  Lab Results: No results found for this basename: cbc, bmp, coags, chol, tri, ldl, hga1c   Lipid Panel No results found for this basename: CHOL, TRIG, HDL, CHOLHDL, VLDL, LDLCALC,  in the last 72 hours  Studies/Results: Ct Head Wo Contrast  08/04/2013   CLINICAL DATA:  68 year old female with seizure and syncope.  EXAM: CT HEAD WITHOUT CONTRAST  TECHNIQUE: Contiguous axial images were obtained from the base of the skull through the vertex without intravenous contrast.  COMPARISON:  05/14/2012 MR and 03/22/2012 CT.  FINDINGS: Severe chronic hydrocephalus of the lateral and 3rd ventricles again noted. Chronic compression of the cerebral hemispheric tissue is again identified.  No acute intracranial abnormalities are identified, including mass lesion or mass effect, extra-axial fluid collection, midline shift, hemorrhage, or acute infarction. The visualized bony calvarium is unremarkable.  IMPRESSION: No evidence of acute intracranial abnormality.  Severe chronic hydrocephalus.   Electronically Signed   By: Laveda Abbe M.D.   On: 08/04/2013 11:56   Dg Chest Port 1 View  08/04/2013   CLINICAL DATA:  Possible seizure, syncope  EXAM: PORTABLE CHEST - 1 VIEW  COMPARISON:  07/05/2013  FINDINGS: Cardiomediastinal silhouette is stable. No acute infiltrate or pleural effusion. No pulmonary edema. Mild basilar atelectasis.  IMPRESSION: No acute infiltrate or pulmonary edema.  Mild basilar atelectasis.   Electronically Signed   By: Natasha Mead M.D.   On: 08/04/2013 12:13    MEDICATIONS                                                                                                                        Scheduled: . atorvastatin  40 mg Oral QPM  . digoxin  0.125 mg Oral Daily  . fesoterodine  8 mg Oral QHS  . insulin aspart  0-9 Units  Subcutaneous TID WC  . levETIRAcetam  500 mg Intravenous Q12H  . lisinopril  20 mg Oral Daily  . mirabegron ER  50 mg Oral QHS  . pantoprazole  40 mg Oral Daily  . propranolol  10 mg Oral BID  . sodium chloride  3 mL Intravenous Q12H    ASSESSMENT/PLAN:                                                                                                            68 YO female with new onset seizures. No further seizures while on Keppra 500 mg BID.    Recommend: 1) MRI brain with and without contrast 2) EEG      Assessment and plan discussed with  with attending physician and they are in agreement.    Felicie Morn PA-C [facility] [phone]  08/05/2013, 12:09 PM
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
## 2013-08-05 NOTE — Progress Notes (Signed)
UR completed. Patient changed to inpatient r/t new onset seizures requiring IV anticonvulsants.

## 2013-08-05 NOTE — Procedures (Signed)
ELECTROENCEPHALOGRAM REPORT  Patient: Leah Payne       Room #: 1O104N24 EEG No. ID: [id number] Age: 68 y.o.        Sex: female Referring Physician: Ghimire Report Date:  08/05/2013        Interpreting Physician: Aline BrochureSTEWART,Leah Payne  History: Leah Payne is an 68 y.o. female history of congenital hydrocephalus who experienced 2 generalized seizures 08/04/2013.  Indications for study:  Rule out new-onset seizure disorder.  Technique: This is an 18 channel routine scalp EEG performed at the bedside with bipolar and monopolar montages arranged in accordance to the international 10/20 system of electrode placement.   Description: This EEG recording was performed during wakefulness and during sleep. Predominant background activity during wakefulness consisted of low amplitude diffuse mixed delta and theta activity diffusely. 8 Hz alpha rhythm was recorded from posterior head regions. Brief photic stimulation produced minimal symmetrical occipital driving response. Hyperventilation was not performed. During sleep to slowing of background activity diffusely and symmetrically with symmetrical vertex waves, sleep spindles and K.-complexes recorded. No epileptiform discharges were recorded during wakefulness nor during sleep.  Interpretation: EEG is abnormal with mild generalized nonspecific continuous slowing of cerebral activity. No evidence of an epileptic disorder was demonstrated.   Venetia MaxonR Kaymarie Wynn M.D. [facility] [phone]

## 2013-08-06 ENCOUNTER — Inpatient Hospital Stay (HOSPITAL_COMMUNITY): Payer: Medicare PPO

## 2013-08-06 LAB — GLUCOSE, CAPILLARY
Glucose-Capillary: 127 mg/dL — ABNORMAL HIGH (ref 70–99)
Glucose-Capillary: 134 mg/dL — ABNORMAL HIGH (ref 70–99)
Glucose-Capillary: 126 mg/dL — ABNORMAL HIGH (ref 70–99)

## 2013-08-06 MED ORDER — GADOBENATE DIMEGLUMINE 529 MG/ML IV SOLN
13.0000 mL | Freq: Once | INTRAVENOUS | Status: AC | PRN
  Administered 2013-08-06: 13 mL via INTRAVENOUS

## 2013-08-06 MED ORDER — LEVETIRACETAM 500 MG PO TABS: 500.0000 mg | ORAL_TABLET | Freq: Two times a day (BID) | ORAL | Status: DC

## 2013-08-06 NOTE — Progress Notes (Signed)
NEURO HOSPITALIST PROGRESS NOTE   SUBJECTIVE:                                                                                                                        No further seizures while on Keppra.  No SE while on Keppra.    OBJECTIVE:                                                                                                                           Vital signs in last 24 hours: Temp:  [97.8 F (36.6 C)-98.9 F (37.2 C)] 98.9 F (37.2 C) (01/13 0653) Pulse Rate:  [82-88] 88 (01/13 0653) Resp:  [16-20] 20 (01/13 0653) BP: (114-146)/(62-82) 146/82 mmHg (01/13 0653) SpO2:  [95 %-98 %] 98 % (01/13 0653)  Intake/Output from previous day:   Intake/Output this shift:   Nutritional status: General  Past Medical History  Diagnosis Date  . Hydrocephalus   . Tachycardia      Neurologic Exam:   Mental Status: Alert, oriented, thought content appropriate.  Speech fluent without evidence of aphasia.  Able to follow 3 step commands without difficulty. Cranial Nerves: II: Visual fields grossly normal, pupils equal, round, reactive to light and accommodation III,IV, VI: ptosis not present, extra-ocular motions intact bilaterally V,VII: smile symmetric, facial light touch sensation normal bilaterally VIII: hearing normal bilaterally IX,X: gag reflex present XI: bilateral shoulder shrug XII: midline tongue extension without atrophy or fasciculations  Motor: Right : Upper extremity   5/5    Left:     Upper extremity   5/5  Lower extremity   5/5     Lower extremity   5/5 Tone and bulk:normal tone throughout; no atrophy noted Sensory: Pinprick and light touch intact throughout, bilaterally Deep Tendon Reflexes:  Right: Upper Extremity   Left: Upper extremity   biceps (C-5 to C-6) 2/4   biceps (C-5 to C-6) 2/4 tricep (C7) 2/4    triceps (C7) 2/4 Brachioradialis (C6) 2/4  Brachioradialis (C6) 2/4  Lower Extremity Lower Extremity   quadriceps (L-2 to L-4) 2/4   quadriceps (L-2 to L-4) 2/4 Achilles (S1) 1/4   Achilles (S1) 1/4  Plantars: Right: downgoing   Left: mute    Lab Results: No  results found for this basename: cbc, bmp, coags, chol, tri, ldl, hga1c   Lipid Panel No results found for this basename: CHOL, TRIG, HDL, CHOLHDL, VLDL, LDLCALC,  in the last 72 hours  Studies/Results: Mr Lodema PilotBrain W Wo Contrast  08/06/2013   CLINICAL DATA:  68 year old female unresponsive episode. Initial encounter. Congenital hydrocephalus.  EXAM: MRI HEAD WITHOUT AND WITH CONTRAST  TECHNIQUE: Multiplanar, multiecho pulse sequences of the brain and surrounding structures were obtained without and with intravenous contrast.  CONTRAST:  13mL MULTIHANCE GADOBENATE DIMEGLUMINE 529 MG/ML IV SOLN  COMPARISON:  Head CT 08/04/2013. [place] [facility] brain MRI 05/14/2012. Head CT 03/22/2012.  FINDINGS: Chronic severe ventriculomegaly. Subsequently, due to the patient's head size she had to be imaged without the dedicated head coil. Stable ventricle size and configuration. As before, the fourth ventricle is diminutive. The cerebral aqueduct is diminutive. No evidence of transependymal edema.  No restricted diffusion or evidence of acute infarction. Major intracranial vascular flow voids are grossly stable.  Cerebral volume appears stable. No acute intracranial mass effect identified. No acute intracranial hemorrhage identified. Diminutive pituitary re- identified. Stable and grossly negative cervicomedullary junction and visualized cervical spine.  There is chronic mostly subcortical appearing cerebral white matter T2 and FLAIR hyperintensity, not significantly changed. Deep gray matter nuclei and posterior fossa gray and white matter signal remains within normal limits. No abnormal enhancement identified.  Bone marrow signal appears stable and within normal limits. Visualized orbit soft tissues are within normal limits. Visualized paranasal sinuses and  mastoids are clear. Negative scalp soft tissues.  IMPRESSION: 1.  No acute intracranial abnormality. 2. 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 3. Stable nonspecific cerebral white matter signal changes.   Electronically Signed   By: Augusto GambleLee  Hall M.D.   On: 08/06/2013 12:56   EEG: EEG is abnormal with mild generalized nonspecific continuous slowing of cerebral activity. No evidence of an epileptic disorder was demonstrated.  MEDICATIONS                                                                                                                        Scheduled: . atorvastatin  40 mg Oral QPM  . digoxin  0.125 mg Oral Daily  . enoxaparin (LOVENOX) injection  40 mg Subcutaneous Q24H  . fesoterodine  8 mg Oral QHS  . insulin aspart  0-9 Units Subcutaneous TID WC  . levETIRAcetam  500 mg Oral BID  . lisinopril  20 mg Oral Daily  . mirabegron ER  50 mg Oral QHS  . pantoprazole  40 mg Oral Daily  . propranolol  10 mg Oral BID  . sodium chloride  3 mL Intravenous Q12H    ASSESSMENT/PLAN:  68 YO female with new onset seizures. No further seizures while on Keppra 500 mg BID. MRI sows no intracranial abnormality and no abnormal enhancements.  EEG shows no epileptiform activity.   Recommend: 1) Continue Keppra 500 mg BID PO 2) Follow up with out patient neurology.   No further recommendations. Neurology S/O   Assessment and plan discussed with with attending physician and they are in agreement.    Felicie Morn PA-C [facility] [phone]  08/06/2013, 2:24 PM

## 2013-08-06 NOTE — Discharge Instructions (Signed)
No driving, participating in activities at heights, no participating in high-speed water sports.

## 2013-08-06 NOTE — Progress Notes (Signed)
Discharge orders received, pt for discharge home today, IV and telemetry D/C and D/C instructions and Rx given with verbalized understanding.  Family at bedside to assist pt with discharge. Staff brought pt downstairs via wheelchair.

## 2013-08-06 NOTE — Discharge Summary (Signed)
PATIENT DETAILS Name: Leah BellsMargaret L Cavness Age: 68 y.o. Sex: female Date of Birth: 06/13/1946 MRN: [id number]. Admit Date: 08/04/2013 Admitting Physician: Elby Showersatherine Walsh, MD NWG:[id number],QMVPCP:MOREIRA,ROY, MD  Recommendations for Outpatient Follow-up:  1. Started on Keppra for seizures.  PRIMARY DISCHARGE DIAGNOSIS:  Active Problems:   Seizures   Congenital hydrocephalus   Hypertension   Diabetes mellitus type 2 in obese   Hyperlipidemia   Seizure      PAST MEDICAL HISTORY: Past Medical History  Diagnosis Date  . Hydrocephalus   . Tachycardia     DISCHARGE MEDICATIONS:   Medication List         atorvastatin 40 MG tablet  Commonly known as:  LIPITOR  40 mg every evening.     DIGOX 0.125 MG tablet  Generic drug:  digoxin  0.125 mg daily.     esomeprazole 40 MG capsule  Commonly known as:  NEXIUM  Take 40 mg by mouth at bedtime.     levETIRAcetam 500 MG tablet  Commonly known as:  KEPPRA  Take 1 tablet (500 mg total) by mouth 2 (two) times daily.     lidocaine 5 % ointment  Commonly known as:  XYLOCAINE  1 application 2 (two) times daily as needed for mild pain.     lisinopril 20 MG tablet  Commonly known as:  PRINIVIL,ZESTRIL  20 mg daily.     metFORMIN 500 MG tablet  Commonly known as:  GLUCOPHAGE  500 mg daily with breakfast.     [name] 50 MG Tb24 tablet  Generic drug:  mirabegron ER  50 mg at bedtime.     naproxen sodium 220 MG tablet  Commonly known as:  ANAPROX  Take 440 mg by mouth 2 (two) times daily with a meal.     propranolol 10 MG tablet  Commonly known as:  INDERAL  10 mg 2 (two) times daily.     [name] 8 MG Tb24 tablet  Generic drug:  fesoterodine  8 mg at bedtime.        ALLERGIES:  No Known Allergies  BRIEF HPI:  See H&P, Labs, Consult and Test reports for all details in brief, patient was admitted for evaluation of 2 weakness generalized tonic-clonic seizures.  CONSULTATIONS:   neurology  PERTINENT RADIOLOGIC STUDIES: Ct Head Wo  Contrast  08/04/2013   CLINICAL DATA:  68 year old female with seizure and syncope.  EXAM: CT HEAD WITHOUT CONTRAST  TECHNIQUE: Contiguous axial images were obtained from the base of the skull through the vertex without intravenous contrast.  COMPARISON:  05/14/2012 MR and 03/22/2012 CT.  FINDINGS: Severe chronic hydrocephalus of the lateral and 3rd ventricles again noted. Chronic compression of the cerebral hemispheric tissue is again identified.  No acute intracranial abnormalities are identified, including mass lesion or mass effect, extra-axial fluid collection, midline shift, hemorrhage, or acute infarction. The visualized bony calvarium is unremarkable.  IMPRESSION: No evidence of acute intracranial abnormality.  Severe chronic hydrocephalus.   Electronically Signed   By: Laveda AbbeJeff  Hu M.D.   On: 08/04/2013 11:56   Mr Laqueta JeanBrain W HQWo Contrast  08/06/2013   CLINICAL DATA:  68 year old female unresponsive episode. Initial encounter. Congenital hydrocephalus.  EXAM: MRI HEAD WITHOUT AND WITH CONTRAST  TECHNIQUE: Multiplanar, multiecho pulse sequences of the brain and surrounding structures were obtained without and with intravenous contrast.  CONTRAST:  13mL MULTIHANCE GADOBENATE DIMEGLUMINE 529 MG/ML IV SOLN  COMPARISON:  Head CT 08/04/2013. [place] imaging brain MRI 05/14/2012. Head CT 03/22/2012.  FINDINGS: Chronic severe ventriculomegaly. Subsequently,  due to the patient's head size she had to be imaged without the dedicated head coil. Stable ventricle size and configuration. As before, the fourth ventricle is diminutive. The cerebral aqueduct is diminutive. No evidence of transependymal edema.  No restricted diffusion or evidence of acute infarction. Major intracranial vascular flow voids are grossly stable.  Cerebral volume appears stable. No acute intracranial mass effect identified. No acute intracranial hemorrhage identified. Diminutive pituitary re- identified. Stable and grossly negative cervicomedullary  junction and visualized cervical spine.  There is chronic mostly subcortical appearing cerebral white matter T2 and FLAIR hyperintensity, not significantly changed. Deep gray matter nuclei and posterior fossa gray and white matter signal remains within normal limits. No abnormal enhancement identified.  Bone marrow signal appears stable and within normal limits. Visualized orbit soft tissues are within normal limits. Visualized paranasal sinuses and mastoids are clear. Negative scalp soft tissues.  IMPRESSION: 1.  No acute intracranial abnormality. 2. 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 3. Stable nonspecific cerebral white matter signal changes.   Electronically Signed   By: Augusto Gamble M.D.   On: 08/06/2013 12:56   Dg Chest Port 1 View  08/04/2013   CLINICAL DATA:  Possible seizure, syncope  EXAM: PORTABLE CHEST - 1 VIEW  COMPARISON:  07/05/2013  FINDINGS: Cardiomediastinal silhouette is stable. No acute infiltrate or pleural effusion. No pulmonary edema. Mild basilar atelectasis.  IMPRESSION: No acute infiltrate or pulmonary edema.  Mild basilar atelectasis.   Electronically Signed   By: Natasha Mead M.D.   On: 08/04/2013 12:13     PERTINENT LAB RESULTS: CBC:  Recent Labs  08/04/13 1050 08/05/13 0350  WBC 8.6 4.7  HGB 13.7 12.3  HCT 40.8 35.1*  PLT 296 212   CMET CMP     Component Value Date/Time   NA 138 08/05/2013 0350   K 3.8 08/05/2013 0350   CL 102 08/05/2013 0350   CO2 24 08/05/2013 0350   GLUCOSE 97 08/05/2013 0350   BUN 8 08/05/2013 0350   CREATININE 0.66 08/05/2013 0350   CALCIUM 8.9 08/05/2013 0350   PROT 8.0 08/04/2013 1050   ALBUMIN 4.6 08/04/2013 1050   AST 25 08/04/2013 1050   ALT 31 08/04/2013 1050   ALKPHOS 84 08/04/2013 1050   BILITOT 0.4 08/04/2013 1050   GFRNONAA 89* 08/05/2013 0350   GFRAA >90 08/05/2013 0350    GFR Estimated Creatinine Clearance: 60 ml/min (by C-G formula based on Cr of 0.66). No  results found for this basename: LIPASE, AMYLASE,  in the last 72 hours No results found for this basename: CKTOTAL, CKMB, CKMBINDEX, TROPONINI,  in the last 72 hours No components found with this basename: POCBNP,  No results found for this basename: DDIMER,  in the last 72 hours  Recent Labs  08/05/13 0350  HGBA1C 6.7*   No results found for this basename: CHOL, HDL, LDLCALC, TRIG, CHOLHDL, LDLDIRECT,  in the last 72 hours  Recent Labs  08/05/13 0350  TSH 1.721   No results found for this basename: VITAMINB12, FOLATE, FERRITIN, TIBC, IRON, RETICCTPCT,  in the last 72 hours Coags: No results found for this basename: PT, INR,  in the last 72 hours Microbiology: No results found for this or any previous visit (from the past 240 hour(s)).   BRIEF HOSPITAL COURSE:   Active Problems: New onset Seizures  - She had 2 witnessed generalized tonic-clonic seizures, first one was at a church, the second one was in the  emergency room. - On admission started on intravenous Keppra, this has not been changed to oral Keppra. She was seen in consultation by neurology. MRI brain did not show any acute changes, EEG was negative for any epileptiform activity. Current recommendations from neurology are to continue with Keppra on discharge, a followup appointment with [facility] has been set up. - Patient has been advised not to drive, not sedated activities at height, not to participate in water sports.  Congenital hydrocephalus  - Further workup in the outpatient setting   Hypertension  - Controlled with lisinopril and propranolol   Diabetes mellitus type 2 in obese  - CBGs stable, continue SSI  - Resume metformin on discharge   Hyperlipidemia  - Continue statins  TODAY-DAY OF DISCHARGE:  Subjective:   Jonah Blue today has no headache,no chest abdominal pain,no new weakness tingling or numbness, feels much better wants to go home today.   Objective:   Blood pressure 146/82,  pulse 88, temperature 98.9 F (37.2 C), temperature source Oral, resp. rate 20, height 5\' 2"  (1.575 m), weight 66.225 kg (146 lb), SpO2 98.00%. No intake or output data in the 24 hours ending 08/06/13 1518 Filed Weights   08/04/13 1850  Weight: 66.225 kg (146 lb)    Exam Awake Alert, Oriented *3, No new F.N deficits, Normal affect Ruth.AT,PERRAL Supple Neck,No JVD, No cervical lymphadenopathy appriciated.  Symmetrical Chest wall movement, Good air movement bilaterally, CTAB RRR,No Gallops,Rubs or new Murmurs, No Parasternal Heave +ve B.Sounds, Abd Soft, Non tender, No organomegaly appriciated, No rebound -guarding or rigidity. No Cyanosis, Clubbing or edema, No new Rash or bruise  DISCHARGE CONDITION: Stable  DISPOSITION: Home  DISCHARGE INSTRUCTIONS:    Activity:  As tolerated   Diet recommendation: Diabetic Diet Heart Healthy diet   Discharge Orders   Future Appointments Provider Department Dept Phone   09/03/2013 10:30 AM Glendale Chard, MD [facility] [phone]   Future Orders Complete By Expires   Call MD for:  persistant dizziness or light-headedness  As directed    Diet - low sodium heart healthy  As directed    Diet Carb Modified  As directed    Increase activity slowly  As directed       Follow-up Information   Follow up with Ralene Ok, MD. Schedule an appointment as soon as possible for a visit in 1 week.   Specialty:  Internal Medicine   Contact information:   [address] [place] [address] [phone]       Follow up with Nita Sickle, MD On 09/03/2013. (appointment at 10 am)    Specialty:  Neurology   Contact information:   [address] [place] [address] [phone]      Total Time spent on discharge equals 45 minutes.  SignedJeoffrey Massed 08/06/2013 3:18 PM

## 2013-08-30 ENCOUNTER — Encounter: Payer: Self-pay | Admitting: Neurology

## 2013-09-03 ENCOUNTER — Telehealth: Payer: Self-pay | Admitting: Neurology

## 2013-09-03 ENCOUNTER — Ambulatory Visit: Payer: Medicare PPO | Admitting: Neurology

## 2013-09-03 ENCOUNTER — Encounter: Payer: Self-pay | Admitting: Neurology

## 2013-09-23 ENCOUNTER — Ambulatory Visit: Payer: Medicare PPO | Admitting: Neurology

## 2013-09-24 ENCOUNTER — Telehealth: Payer: Self-pay | Admitting: Neurology

## 2013-09-24 ENCOUNTER — Encounter: Payer: Self-pay | Admitting: Neurology

## 2013-09-24 NOTE — Telephone Encounter (Signed)
Pt no showed new pt appt sch for 09/23/13 w/ Dr. Karel JarvisAquino. No show letter mailed to pt / Sherri

## 2013-11-04 NOTE — Telephone Encounter (Signed)
error 

## 2014-06-24 ENCOUNTER — Other Ambulatory Visit: Payer: Self-pay

## 2014-06-24 ENCOUNTER — Ambulatory Visit (HOSPITAL_COMMUNITY)
Admission: EM | Admit: 2014-06-24 | Discharge: 2014-06-24 | Discharge status: Against Medical Advice | Payer: Medicare PPO | Attending: Emergency Medicine | Admitting: Emergency Medicine

## 2014-06-24 DIAGNOSIS — I471 Supraventricular tachycardia: Secondary | ICD-10-CM | POA: Insufficient documentation

## 2014-06-24 DIAGNOSIS — R9431 Abnormal electrocardiogram [ECG] [EKG]: Secondary | ICD-10-CM | POA: Insufficient documentation

## 2014-06-24 DIAGNOSIS — I499 Cardiac arrhythmia, unspecified: Secondary | ICD-10-CM | POA: Diagnosis present

## 2014-08-29 ENCOUNTER — Encounter (HOSPITAL_COMMUNITY): Payer: Self-pay | Admitting: *Deleted

## 2014-08-29 ENCOUNTER — Emergency Department (HOSPITAL_COMMUNITY): Payer: Medicare Other

## 2014-08-29 ENCOUNTER — Inpatient Hospital Stay (HOSPITAL_COMMUNITY)
Admission: EM | Admit: 2014-08-29 | Discharge: 2014-08-31 | DRG: 641 | Disposition: A | Discharge status: Home Health | Payer: Medicare Other | Attending: Internal Medicine | Admitting: Internal Medicine

## 2014-08-29 DIAGNOSIS — N3281 Overactive bladder: Secondary | ICD-10-CM | POA: Diagnosis present

## 2014-08-29 DIAGNOSIS — Z79899 Other long term (current) drug therapy: Secondary | ICD-10-CM

## 2014-08-29 DIAGNOSIS — J069 Acute upper respiratory infection, unspecified: Secondary | ICD-10-CM

## 2014-08-29 DIAGNOSIS — Z6827 Body mass index (BMI) 27.0-27.9, adult: Secondary | ICD-10-CM | POA: Diagnosis not present

## 2014-08-29 DIAGNOSIS — I10 Essential (primary) hypertension: Secondary | ICD-10-CM | POA: Diagnosis present

## 2014-08-29 DIAGNOSIS — E785 Hyperlipidemia, unspecified: Secondary | ICD-10-CM | POA: Diagnosis present

## 2014-08-29 DIAGNOSIS — N3000 Acute cystitis without hematuria: Secondary | ICD-10-CM

## 2014-08-29 DIAGNOSIS — R569 Unspecified convulsions: Secondary | ICD-10-CM

## 2014-08-29 DIAGNOSIS — E669 Obesity, unspecified: Secondary | ICD-10-CM

## 2014-08-29 DIAGNOSIS — N390 Urinary tract infection, site not specified: Secondary | ICD-10-CM | POA: Diagnosis present

## 2014-08-29 DIAGNOSIS — R000 Tachycardia, unspecified: Secondary | ICD-10-CM | POA: Diagnosis present

## 2014-08-29 DIAGNOSIS — G40909 Epilepsy, unspecified, not intractable, without status epilepticus: Secondary | ICD-10-CM

## 2014-08-29 DIAGNOSIS — E119 Type 2 diabetes mellitus without complications: Secondary | ICD-10-CM

## 2014-08-29 DIAGNOSIS — Q039 Congenital hydrocephalus, unspecified: Secondary | ICD-10-CM | POA: Diagnosis not present

## 2014-08-29 DIAGNOSIS — R5383 Other fatigue: Secondary | ICD-10-CM | POA: Diagnosis present

## 2014-08-29 DIAGNOSIS — E1169 Type 2 diabetes mellitus with other specified complication: Secondary | ICD-10-CM | POA: Diagnosis present

## 2014-08-29 DIAGNOSIS — R682 Dry mouth, unspecified: Secondary | ICD-10-CM | POA: Diagnosis present

## 2014-08-29 DIAGNOSIS — R631 Polydipsia: Secondary | ICD-10-CM | POA: Diagnosis present

## 2014-08-29 DIAGNOSIS — E871 Hypo-osmolality and hyponatremia: Secondary | ICD-10-CM | POA: Diagnosis present

## 2014-08-29 DIAGNOSIS — R5381 Other malaise: Secondary | ICD-10-CM | POA: Diagnosis present

## 2014-08-29 DIAGNOSIS — R531 Weakness: Secondary | ICD-10-CM

## 2014-08-29 DIAGNOSIS — G919 Hydrocephalus, unspecified: Secondary | ICD-10-CM

## 2014-08-29 HISTORY — DX: Hypo-osmolality and hyponatremia: E87.1

## 2014-08-29 HISTORY — DX: Hyperlipidemia, unspecified: E78.5

## 2014-08-29 HISTORY — DX: Epilepsy, unspecified, not intractable, without status epilepticus: G40.909

## 2014-08-29 HISTORY — DX: Hydrocephalus, unspecified: G91.9

## 2014-08-29 LAB — CBC WITH DIFFERENTIAL/PLATELET
Basophils Absolute: 0 10*3/uL (ref 0.0–0.1)
Basophils Relative: 1 % (ref 0–1)
Eosinophils Absolute: 0 10*3/uL (ref 0.0–0.7)
Eosinophils Relative: 1 % (ref 0–5)
HCT: 35.1 % — ABNORMAL LOW (ref 36.0–46.0)
Hemoglobin: 11.8 g/dL — ABNORMAL LOW (ref 12.0–15.0)
Lymphocytes Relative: 29 % (ref 12–46)
Lymphs Abs: 1.3 10*3/uL (ref 0.7–4.0)
MCH: 28.6 pg (ref 26.0–34.0)
MCHC: 33.6 g/dL (ref 30.0–36.0)
MCV: 85.2 fL (ref 78.0–100.0)
Monocytes Absolute: 0.7 10*3/uL (ref 0.1–1.0)
Monocytes Relative: 15 % — ABNORMAL HIGH (ref 3–12)
Neutro Abs: 2.4 10*3/uL (ref 1.7–7.7)
Neutrophils Relative %: 54 % (ref 43–77)
Platelets: 194 10*3/uL (ref 150–400)
RBC: 4.12 MIL/uL (ref 3.87–5.11)
RDW: 12.4 % (ref 11.5–15.5)
WBC: 4.4 10*3/uL (ref 4.0–10.5)

## 2014-08-29 LAB — RETICULOCYTES
RBC.: 4.07 MIL/uL (ref 3.87–5.11)
Retic Count, Absolute: 24.4 10*3/uL (ref 19.0–186.0)
Retic Ct Pct: 0.6 % (ref 0.4–3.1)

## 2014-08-29 LAB — IRON AND TIBC
Iron: 17 ug/dL — ABNORMAL LOW (ref 42–145)
Saturation Ratios: 8 % — ABNORMAL LOW (ref 20–55)
TIBC: 212 ug/dL — ABNORMAL LOW (ref 250–470)
UIBC: 195 ug/dL (ref 125–400)

## 2014-08-29 LAB — COMPREHENSIVE METABOLIC PANEL
ALT: 26 U/L (ref 0–35)
AST: 28 U/L (ref 0–37)
Albumin: 3.6 g/dL (ref 3.5–5.2)
Alkaline Phosphatase: 65 U/L (ref 39–117)
Anion gap: 4 — ABNORMAL LOW (ref 5–15)
BUN: 6 mg/dL (ref 6–23)
CO2: 26 mmol/L (ref 19–32)
Calcium: 8.5 mg/dL (ref 8.4–10.5)
Chloride: 98 mmol/L (ref 96–112)
Creatinine, Ser: 0.76 mg/dL (ref 0.50–1.10)
GFR calc Af Amer: >90 mL/min (ref >90)
GFR calc non Af Amer: 84 mL/min — ABNORMAL LOW (ref >90)
Glucose, Bld: 121 mg/dL — ABNORMAL HIGH (ref 70–99)
Potassium: 3.8 mmol/L (ref 3.5–5.1)
Sodium: 128 mmol/L — ABNORMAL LOW (ref 135–145)
Total Bilirubin: 0.5 mg/dL (ref 0.3–1.2)
Total Protein: 6.6 g/dL (ref 6.0–8.3)

## 2014-08-29 LAB — GLUCOSE, CAPILLARY
Glucose-Capillary: 92 mg/dL (ref 70–99)
Glucose-Capillary: 111 mg/dL — ABNORMAL HIGH (ref 70–99)
Glucose-Capillary: 161 mg/dL — ABNORMAL HIGH (ref 70–99)

## 2014-08-29 LAB — URINALYSIS, ROUTINE W REFLEX MICROSCOPIC
Bilirubin Urine: NEGATIVE
Glucose, UA: NEGATIVE mg/dL
Hgb urine dipstick: NEGATIVE
Ketones, ur: NEGATIVE mg/dL
Nitrite: NEGATIVE
Protein, ur: NEGATIVE mg/dL
Specific Gravity, Urine: 1.015 (ref 1.005–1.030)
Urobilinogen, UA: 1 mg/dL (ref 0.0–1.0)
pH: 6.5 (ref 5.0–8.0)

## 2014-08-29 LAB — URINE MICROSCOPIC-ADD ON

## 2014-08-29 LAB — SODIUM, URINE, RANDOM: Sodium, Ur: 76 mmol/L

## 2014-08-29 LAB — BRAIN NATRIURETIC PEPTIDE: B Natriuretic Peptide: 266 pg/mL — ABNORMAL HIGH (ref 0.0–100.0)

## 2014-08-29 LAB — OSMOLALITY, URINE: Osmolality, Ur: 379 mOsm/kg — ABNORMAL LOW (ref 390–1090)

## 2014-08-29 LAB — CREATININE, URINE, RANDOM: Creatinine, Urine: 89.36 mg/dL

## 2014-08-29 LAB — I-STAT CG4 LACTIC ACID, ED: Lactic Acid, Venous: 1.62 mmol/L (ref 0.5–2.0)

## 2014-08-29 MED ORDER — CEFTRIAXONE SODIUM 1 G IJ SOLR
1.0000 g | Freq: Once | INTRAMUSCULAR | Status: AC
  Administered 2014-08-29: 1 g via INTRAVENOUS
  Filled 2014-08-29: qty 10

## 2014-08-29 MED ORDER — PROPRANOLOL HCL 10 MG PO TABS
10.0000 mg | ORAL_TABLET | Freq: Two times a day (BID) | ORAL | Status: DC
  Administered 2014-08-29 – 2014-08-31 (×4): 10 mg via ORAL
  Filled 2014-08-29 (×5): qty 1

## 2014-08-29 MED ORDER — SODIUM CHLORIDE 0.9 % IV BOLUS (SEPSIS)
500.0000 mL | Freq: Once | INTRAVENOUS | Status: AC
  Administered 2014-08-29: 500 mL via INTRAVENOUS

## 2014-08-29 MED ORDER — PANTOPRAZOLE SODIUM 40 MG PO TBEC
40.0000 mg | DELAYED_RELEASE_TABLET | Freq: Every day | ORAL | Status: DC
  Administered 2014-08-30 – 2014-08-31 (×2): 40 mg via ORAL
  Filled 2014-08-29 (×2): qty 1

## 2014-08-29 MED ORDER — LISINOPRIL 20 MG PO TABS
20.0000 mg | ORAL_TABLET | Freq: Every day | ORAL | Status: DC
  Administered 2014-08-30 – 2014-08-31 (×2): 20 mg via ORAL
  Filled 2014-08-29 (×2): qty 1

## 2014-08-29 MED ORDER — CEFTRIAXONE SODIUM 1 G IJ SOLR
1.0000 g | INTRAMUSCULAR | Status: DC
  Administered 2014-08-30: 1 g via INTRAVENOUS
  Filled 2014-08-29 (×2): qty 10

## 2014-08-29 MED ORDER — ACETAMINOPHEN 325 MG PO TABS: 650.0000 mg | ORAL_TABLET | Freq: Four times a day (QID) | ORAL | Status: DC | PRN

## 2014-08-29 MED ORDER — ALUM & MAG HYDROXIDE-SIMETH 200-200-20 MG/5ML PO SUSP: 30.0000 mL | Freq: Four times a day (QID) | ORAL | Status: DC | PRN

## 2014-08-29 MED ORDER — INSULIN ASPART 100 UNIT/ML ~~LOC~~ SOLN
0.0000 [IU] | Freq: Three times a day (TID) | SUBCUTANEOUS | Status: DC
  Administered 2014-08-30 – 2014-08-31 (×2): 2 [IU] via SUBCUTANEOUS

## 2014-08-29 MED ORDER — MORPHINE SULFATE 2 MG/ML IJ SOLN: 1.0000 mg | INTRAMUSCULAR | Status: DC | PRN

## 2014-08-29 MED ORDER — MIRABEGRON ER 50 MG PO TB24
50.0000 mg | ORAL_TABLET | Freq: Every day | ORAL | Status: DC
  Administered 2014-08-30: 50 mg via ORAL
  Filled 2014-08-29 (×2): qty 1

## 2014-08-29 MED ORDER — OXYCODONE HCL 5 MG PO TABS: 5.0000 mg | ORAL_TABLET | ORAL | Status: DC | PRN

## 2014-08-29 MED ORDER — HEPARIN SODIUM (PORCINE) 5000 UNIT/ML IJ SOLN
5000.0000 [IU] | Freq: Three times a day (TID) | INTRAMUSCULAR | Status: DC
  Administered 2014-08-29 – 2014-08-31 (×5): 5000 [IU] via SUBCUTANEOUS
  Filled 2014-08-29 (×7): qty 1

## 2014-08-29 MED ORDER — DIGOXIN 125 MCG PO TABS
0.1250 mg | ORAL_TABLET | Freq: Every day | ORAL | Status: DC
  Administered 2014-08-30 – 2014-08-31 (×2): 0.125 mg via ORAL
  Filled 2014-08-29 (×2): qty 1

## 2014-08-29 MED ORDER — ACETAMINOPHEN 650 MG RE SUPP: 650.0000 mg | Freq: Four times a day (QID) | RECTAL | Status: DC | PRN

## 2014-08-29 MED ORDER — NAPROXEN 500 MG PO TABS
500.0000 mg | ORAL_TABLET | Freq: Two times a day (BID) | ORAL | Status: DC
  Administered 2014-08-29 – 2014-08-31 (×4): 500 mg via ORAL
  Filled 2014-08-29 (×6): qty 1
  Filled 2014-08-29: qty 2

## 2014-08-29 MED ORDER — NAPROXEN SODIUM 275 MG PO TABS: 440.0000 mg | ORAL_TABLET | Freq: Two times a day (BID) | ORAL | Status: DC

## 2014-08-29 MED ORDER — ONDANSETRON HCL 4 MG/2ML IJ SOLN: 4.0000 mg | Freq: Four times a day (QID) | INTRAMUSCULAR | Status: DC | PRN

## 2014-08-29 MED ORDER — INSULIN ASPART 100 UNIT/ML ~~LOC~~ SOLN: 0.0000 [IU] | Freq: Every day | SUBCUTANEOUS | Status: DC

## 2014-08-29 MED ORDER — FESOTERODINE FUMARATE ER 8 MG PO TB24
8.0000 mg | ORAL_TABLET | Freq: Every day | ORAL | Status: DC
  Administered 2014-08-30: 8 mg via ORAL
  Filled 2014-08-29 (×2): qty 1

## 2014-08-29 MED ORDER — ATORVASTATIN CALCIUM 40 MG PO TABS
40.0000 mg | ORAL_TABLET | Freq: Every evening | ORAL | Status: DC
  Administered 2014-08-30: 40 mg via ORAL
  Filled 2014-08-29 (×2): qty 1

## 2014-08-29 MED ORDER — ONDANSETRON HCL 4 MG PO TABS: 4.0000 mg | ORAL_TABLET | Freq: Four times a day (QID) | ORAL | Status: DC | PRN

## 2014-08-29 MED ORDER — DOCUSATE SODIUM 100 MG PO CAPS
100.0000 mg | ORAL_CAPSULE | Freq: Two times a day (BID) | ORAL | Status: DC
  Administered 2014-08-29 – 2014-08-31 (×4): 100 mg via ORAL
  Filled 2014-08-29 (×4): qty 1

## 2014-08-29 MED ORDER — LEVETIRACETAM 500 MG PO TABS
500.0000 mg | ORAL_TABLET | Freq: Two times a day (BID) | ORAL | Status: DC
  Administered 2014-08-29 – 2014-08-31 (×4): 500 mg via ORAL
  Filled 2014-08-29 (×6): qty 1

## 2014-08-29 NOTE — ED Notes (Signed)
Call Husband Upon Admission  Zollie BeckersWalter  [phone]

## 2014-08-29 NOTE — ED Provider Notes (Signed)
CSN: [id number]     Arrival date & time 08/29/14  0925 History   First MD Initiated Contact with Patient 08/29/14 [phone]     Chief Complaint  Patient presents with  . Weakness     (Consider location/radiation/quality/duration/timing/severity/associated sxs/prior Treatment) HPI    PCP: Ralene Ok, MD Blood pressure 117/68, pulse 70, resp. rate 21, SpO2 96 %.  Leah Payne is a 69 y.o.female with a significant PMH of congenital hydrocephalus and tachycardia  presents to the ER with complaints of generalized weakness for the past two days and mouth dryness. She is here at the recommendation of her husband. She is able to ambulate and sit up/lay down on her own but notices marked weakness. She also reports cough for the past week, non productive. No fevers, nausea, vomiting, diarrhea, headaches, CP, SOB, abdominal pain, diplopia, neck pain, dysuria, rectal bleeding, dizziness. The husband reports her confusion at baseline has been mildly worse and she has been repeating her questions more frequently than normal the past two days. A&O x 3.    Past Medical History  Diagnosis Date  . Hydrocephalus   . Tachycardia    Past Surgical History  Procedure Laterality Date  . Cholecystectomy  1978   History reviewed. No pertinent family history. History  Substance Use Topics  . Smoking status: Never Smoker   . Smokeless tobacco: Not on file  . Alcohol Use: No   OB History    No data available     Review of Systems  10 Systems reviewed and are negative for acute change except as noted in the HPI.    Allergies  Review of patient's allergies indicates no known allergies.  Home Medications   Prior to Admission medications   Medication Sig Start Date End Date Taking? Authorizing Provider  atorvastatin (LIPITOR) 40 MG tablet 40 mg every evening.  07/27/13  Yes Historical Provider, MD  DIGOX 125 MCG tablet 0.125 mg daily.  07/20/13  Yes Historical Provider, MD  esomeprazole (NEXIUM) 40  MG capsule Take 40 mg by mouth at bedtime.   Yes Historical Provider, MD  levETIRAcetam (KEPPRA) 500 MG tablet Take 1 tablet (500 mg total) by mouth 2 (two) times daily. 08/06/13  Yes Shanker Levora Dredge, MD  lisinopril (PRINIVIL,ZESTRIL) 20 MG tablet 20 mg daily.  08/03/13  Yes Historical Provider, MD  metFORMIN (GLUCOPHAGE) 500 MG tablet 500 mg daily with breakfast.  07/12/13  Yes Historical Provider, MD  [name] 50 MG TB24 tablet 50 mg at bedtime.  07/12/13  Yes Historical Provider, MD  propranolol (INDERAL) 10 MG tablet 10 mg 2 (two) times daily.  07/27/13  Yes Historical Provider, MD  [name] 8 MG TB24 tablet 8 mg at bedtime.  07/24/13  Yes Historical Provider, MD  lidocaine (XYLOCAINE) 5 % ointment 1 application 2 (two) times daily as needed for mild pain.  07/22/13   Historical Provider, MD  naproxen sodium (ANAPROX) 220 MG tablet Take 440 mg by mouth 2 (two) times daily with a meal.    Historical Provider, MD   BP 115/62 mmHg  Pulse 66  Resp 20  SpO2 99% Physical Exam  Constitutional: She appears well-developed and well-nourished. No distress.  HENT:  Head: Normocephalic and atraumatic.  Eyes: Pupils are equal, round, and reactive to light.  Neck: Normal range of motion. Neck supple.  Cardiovascular: Normal rate and regular rhythm.   Pulmonary/Chest: Effort normal and breath sounds normal. She has no decreased breath sounds. She has no wheezes.  Abdominal: Soft.  Bowel sounds are normal. She exhibits no distension. There is no tenderness.  Neurological: She is alert.  Cranial nerves II-VIII and X-XII evaluated and show no deficits. Pt alert and oriented x 3 Upper and lower extremity strength is symmetrical and physiologic Normal muscular tone No facial droop Coordination intact, no limb ataxia  Skin: Skin is warm and dry. No rash noted.  Psychiatric: Her mood appears not anxious. She does not exhibit a depressed mood.  Nursing note and vitals reviewed.   ED Course  Procedures  (including critical care time) Labs Review Labs Reviewed  COMPREHENSIVE METABOLIC PANEL - Abnormal; Notable for the following:    Sodium 128 (*)    Glucose, Bld 121 (*)    GFR calc non Af Amer 84 (*)    Anion gap 4 (*)    All other components within normal limits  CBC WITH DIFFERENTIAL/PLATELET - Abnormal; Notable for the following:    Hemoglobin 11.8 (*)    HCT 35.1 (*)    Monocytes Relative 15 (*)    All other components within normal limits  URINALYSIS, ROUTINE W REFLEX MICROSCOPIC - Abnormal; Notable for the following:    Leukocytes, UA LARGE (*)    All other components within normal limits  URINE MICROSCOPIC-ADD ON - Abnormal; Notable for the following:    Squamous Epithelial / LPF FEW (*)    Bacteria, UA MANY (*)    All other components within normal limits  URINE CULTURE  BRAIN NATRIURETIC PEPTIDE  CREATININE, URINE, RANDOM  OSMOLALITY  I-STAT CG4 LACTIC ACID, ED    Imaging Review Dg Chest 2 View  08/29/2014   CLINICAL DATA:  Dizziness, weakness, shortness of breath  EXAM: CHEST  2 VIEW  COMPARISON:  08/04/2013  FINDINGS: There is mild bilateral interstitial thickening. There is no pleural effusion or pneumothorax. There is no focal consolidation. There is enlargement of the central pulmonary vasculature. The heart mediastinum are stable. There is no acute osseous abnormality.  IMPRESSION: 1. Mild bilateral interstitial thickening concerning for mild interstitial edema versus infection.   Electronically Signed   By: Elige KoHetal  Patel   On: 08/29/2014 11:08     EKG Interpretation   Date/Time:  Friday August 29 2014 09:42:36 EST Ventricular Rate:  71 PR Interval:  165 QRS Duration: 89 QT Interval:  385 QTC Calculation: 418 R Axis:   37 Text Interpretation:  Sinus rhythm Borderline repolarization abnormality  Confirmed by BEATON  MD, ROBERT ([id number]) on 08/29/2014 9:48:31 AM      MDM   Final diagnoses:  Weakness  Hyponatremia  Acute cystitis without hematuria  URI  (upper respiratory infection)    The patient is dehydrated with a low sodium, has a large UTI, URI symptoms and feels weak. She will need admission for treatment before she can safely go home. Vital signs are stable- she does not appear to be septic and has a negative lactic acid. Discussed case with Dr. Radford PaxBeaton prior to admission and he agrees with treatment and plan. - pt and family has been made aware of findings and need for hospitalization.  Filed Vitals:   08/29/14 1300  BP: 115/62  Pulse: 66  Resp: 20    1:20 pm- Dr. Bess HarvestPrashant has agree to admit this patient. [facility], inpatient, medsurg, ok to have diet.   Dorthula Matasiffany G Rheana Casebolt, PA-C 08/29/14 1326  Nelia Shiobert L Beaton, MD 09/01/14 [phone]

## 2014-08-29 NOTE — H&P (Signed)
[facility] History and Physical                                                                                    Leah Payne, is a 69 y.o. female  MRN: [id number]   DOB - 10-Aug-1945  Admit Date - 08/29/2014  Outpatient Primary MD for the patient is Ralene Ok, MD  With History of -  Past Medical History  Diagnosis Date  . Hydrocephalus   . Tachycardia       Past Surgical History  Procedure Laterality Date  . Cholecystectomy  1978    in for   Chief Complaint  Patient presents with  . Weakness     HPI Leah Payne  is a 69 y.o. female, with past medical history of tachycardia on beta blockers and digoxin followed by Dr. Donnie Aho as an outpatient, diabetes mellitus on metformin, congenital hydrocephalus as well as prior seizure disorder. She presented to the ER with complaints of generalized weakness for several days and mouth dryness. Upon evaluation in the ER she was found to be hyponatremic with a sodium of 128. In addition her urinalysis was abnormal concerning for UTI. She was started on IV fluids and given an empiric dose of Rocephin IV. Upon our evaluation the patient and the husband admitted that the patient typically drinks large volumes of water and keeps a water bottle by her bedside. She complains of excessive thirst. Patient noted to be on multiple medications for overactive bladder including [name] which can cause dry mouth. Patient has also been experiencing generalized fatigue and malaise greater than 3 months. She does not have any chest pain either at rest or with exertion and no exertional shortness of breath. She has not noticed any dark or bloody stools or emesis. She has not had any change in weight or appetite. She typically uses [employer] along with the water to assist with her chronic dry mouth.  Review of Systems   In addition to the HPI above,  No Fever-chills, No Headache, No changes with Vision or hearing, No problems  swallowing food or Liquids, No Chest pain, Cough or Shortness of Breath, No Abdominal pain, No Nausea or Vomiting, Bowel movements are regular, No Blood in stool or Urine, No dysuria, No new skin rashes or bruises, No new joints pains-aches,  No new weakness, tingling, numbness in any extremity, No recent weight gain or loss, No polyuria, polydypsia or polyphagia, A full 10 point Review of Systems was done, except as stated above, all other Review of Systems were negative.  Social History History  Substance Use Topics  . Smoking status: Never Smoker   . Smokeless tobacco: Not on file  . Alcohol Use: No    Family History Father deceased with history of CAD Mother deceased with history of CAD  Prior to Admission medications   Medication Sig Start Date End Date Taking? Authorizing Provider  atorvastatin (LIPITOR) 40 MG tablet 40 mg every evening.  07/27/13  Yes Historical Provider, MD  DIGOX 125 MCG tablet 0.125 mg daily.  07/20/13  Yes Historical Provider, MD  esomeprazole (NEXIUM) 40 MG capsule Take 40 mg by mouth at  bedtime.   Yes Historical Provider, MD  levETIRAcetam (KEPPRA) 500 MG tablet Take 1 tablet (500 mg total) by mouth 2 (two) times daily. 08/06/13  Yes Shanker Levora Dredge, MD  lisinopril (PRINIVIL,ZESTRIL) 20 MG tablet 20 mg daily.  08/03/13  Yes Historical Provider, MD  metFORMIN (GLUCOPHAGE) 500 MG tablet 500 mg daily with breakfast.  07/12/13  Yes Historical Provider, MD  [name] 50 MG TB24 tablet 50 mg at bedtime.  07/12/13  Yes Historical Provider, MD  propranolol (INDERAL) 10 MG tablet 10 mg 2 (two) times daily.  07/27/13  Yes Historical Provider, MD  [name] 8 MG TB24 tablet 8 mg at bedtime.  07/24/13  Yes Historical Provider, MD  lidocaine (XYLOCAINE) 5 % ointment 1 application 2 (two) times daily as needed for mild pain.  07/22/13   Historical Provider, MD  naproxen sodium (ANAPROX) 220 MG tablet Take 440 mg by mouth 2 (two) times daily with a meal.    Historical  Provider, MD    No Known Allergies  Physical Exam  Vitals  Blood pressure 121/64, pulse 65, resp. rate 15, SpO2 99 %.   General:  lying in bed in NAD, continues to endorse excessive thirst and asking when she can have something to drink  Psych:  Normal affect and insight, Not Suicidal or Homicidal, Awake Alert, Oriented X 3.  Neuro:   No F.N deficits, ALL C.Nerves Intact, Strength 5/5 all 4 extremities, Sensation intact all 4 extremities.  ENT:  Ears and Eyes appear Normal, Conjunctivae clear, PER. Moist oral mucosa without erythema or exudates.  Neck:  Supple, No lymphadenopathy appreciated  Respiratory:  Symmetrical chest wall movement, Good air movement bilaterally, CTAB.  Cardiac:  RRR, No Murmurs, no LE edema noted, no JVD.    Abdomen:  Positive bowel sounds, Soft, Non tender, Non distended,  No masses appreciated  Skin:  No Cyanosis, Normal Skin Turgor, No Skin Rash or Bruise. Marked facial hair concerning for possible underlying polycystic ovarian pattern  Extremities:  Able to move all 4. 5/5 strength in each,  no effusions.  Data Review  CBC  Recent Labs Lab 08/29/14 0955  WBC 4.4  HGB 11.8*  HCT 35.1*  PLT 194  MCV 85.2  MCH 28.6  MCHC 33.6  RDW 12.4  LYMPHSABS 1.3  MONOABS 0.7  EOSABS 0.0  BASOSABS 0.0    Chemistries   Recent Labs Lab 08/29/14 0955  NA 128*  K 3.8  CL 98  CO2 26  GLUCOSE 121*  BUN 6  CREATININE 0.76  CALCIUM 8.5  AST 28  ALT 26  ALKPHOS 65  BILITOT 0.5    CrCl cannot be calculated (Unknown ideal weight.).  No results for input(s): TSH, T4TOTAL, T3FREE, THYROIDAB in the last 72 hours.  Invalid input(s): FREET3  Coagulation profile No results for input(s): INR, PROTIME in the last 168 hours.  No results for input(s): DDIMER in the last 72 hours.  Cardiac Enzymes No results for input(s): CKMB, TROPONINI, MYOGLOBIN in the last 168 hours.  Invalid input(s): CK  Invalid input(s): POCBNP  Urinalysis      Component Value Date/Time   COLORURINE YELLOW 08/29/2014 1206   APPEARANCEUR CLEAR 08/29/2014 1206   LABSPEC 1.015 08/29/2014 1206   PHURINE 6.5 08/29/2014 1206   GLUCOSEU NEGATIVE 08/29/2014 1206   HGBUR NEGATIVE 08/29/2014 1206   BILIRUBINUR NEGATIVE 08/29/2014 1206   KETONESUR NEGATIVE 08/29/2014 1206   PROTEINUR NEGATIVE 08/29/2014 1206   UROBILINOGEN 1.0 08/29/2014 1206   NITRITE NEGATIVE 08/29/2014  1206   LEUKOCYTESUR LARGE* 08/29/2014 1206    Imaging results:   Dg Chest 2 View  08/29/2014   CLINICAL DATA:  Dizziness, weakness, shortness of breath  EXAM: CHEST  2 VIEW  COMPARISON:  08/04/2013  FINDINGS: There is mild bilateral interstitial thickening. There is no pleural effusion or pneumothorax. There is no focal consolidation. There is enlargement of the central pulmonary vasculature. The heart mediastinum are stable. There is no acute osseous abnormality.  IMPRESSION: 1. Mild bilateral interstitial thickening concerning for mild interstitial edema versus infection.   Electronically Signed   By: Elige KoHetal  Patel   On: 08/29/2014 11:08    My personal review of EKG: NSR, No ST changes noted.   Assessment & Plan  Principal Problem:   Hyponatremia with excess extracellular fluid volume -Based on history appears to be hypervolemic hyponatremia therefore we'll discontinue IV fluids and place on 1200 mL fluid retraction -We'll check routine SIADH labs although may be skewed given the fact patient has already received some IV fluids in the ER -Repeat electrolyte panel in a.m. -Medication list reviewed and no apparent offending medications contributing to hyponatremia -As previously discussed several of patient's medications for overactive bladder can't contribute to dry mouth; discussed with patient's husband as well as the patient alternative methods to fluids to alleviate dry mouth such as utilization of sugar-free sour hard candies to promote saliva production-she may need to discuss  this with her primary care physician in the event that there are other medications she continues orally -Admit to general medical floor  Active Problems:   Congenital hydrocephalus/ Seizure disorder -Continue home Keppra -Did not report any recent seizures    UTI/abnormal urinalysis -Urinalysis appears consistent with infectious process -Was given Rocephin in ER prior to culture being obtained; continue Rocephin empirically -Obtain urine culture and follow-up on results      Hypertension -Continue home medications -Current blood pressure well controlled    Diabetes mellitus type 2 in obese -Serum glucose in ER only mildly elevated at 121 -hold home metformin for now -Utilize sliding scale insulin -Check hemoglobin A1c   Malaise and fatigue/borderline anemia -Has been ongoing for greater than 3 months and has worsened in setting of acute hyponatremia -Check TSH -Mildly anemic so check anemia panel -EKG unremarkable so doubt atypical presentation of cardiac ischemia as etiology    Hyperlipidemia -Continue statin    Overactive bladder -Continue home medications    DVT Prophylaxis: Subcutaneous heparin  Family Communication:   Husband at bedside  Code Status:  Full  Condition: Stable  Time spent in minutes : 60   Lonya Johannesen L. ANP on 08/29/2014 at 1:37 PM  Between 7am to 7pm - Pager - [phone]  After 7pm go to [email or web address] - password [email or web address]  And look for the night coverage person covering me after hours  [facility]

## 2014-08-29 NOTE — ED Notes (Signed)
Weakness x 2 days; worse today; "mouth is dry." alert and oriented, no cp, no sob, urination wnl, no diarrhea.

## 2014-08-30 DIAGNOSIS — E871 Hypo-osmolality and hyponatremia: Secondary | ICD-10-CM | POA: Insufficient documentation

## 2014-08-30 DIAGNOSIS — R531 Weakness: Secondary | ICD-10-CM

## 2014-08-30 DIAGNOSIS — R5381 Other malaise: Secondary | ICD-10-CM | POA: Insufficient documentation

## 2014-08-30 LAB — BASIC METABOLIC PANEL
Anion gap: 5 (ref 5–15)
BUN: 8 mg/dL (ref 6–23)
CO2: 25 mmol/L (ref 19–32)
Calcium: 8.3 mg/dL — ABNORMAL LOW (ref 8.4–10.5)
Chloride: 104 mmol/L (ref 96–112)
Creatinine, Ser: 0.69 mg/dL (ref 0.50–1.10)
GFR calc Af Amer: >90 mL/min (ref >90)
GFR calc non Af Amer: 87 mL/min — ABNORMAL LOW (ref >90)
Glucose, Bld: 97 mg/dL (ref 70–99)
Potassium: 4.1 mmol/L (ref 3.5–5.1)
Sodium: 134 mmol/L — ABNORMAL LOW (ref 135–145)

## 2014-08-30 LAB — FOLATE: Folate: 17.6 ng/mL

## 2014-08-30 LAB — GLUCOSE, CAPILLARY
Glucose-Capillary: 78 mg/dL (ref 70–99)
Glucose-Capillary: 111 mg/dL — ABNORMAL HIGH (ref 70–99)
Glucose-Capillary: 128 mg/dL — ABNORMAL HIGH (ref 70–99)
Glucose-Capillary: 112 mg/dL — ABNORMAL HIGH (ref 70–99)

## 2014-08-30 LAB — HEMOGLOBIN A1C
Hgb A1c MFr Bld: 6.8 % — ABNORMAL HIGH (ref 4.8–5.6)
Mean Plasma Glucose: 148 mg/dL

## 2014-08-30 LAB — TSH: TSH: 4.859 u[IU]/mL — ABNORMAL HIGH (ref 0.350–4.500)

## 2014-08-30 LAB — FERRITIN: Ferritin: 221 ng/mL (ref 10–291)

## 2014-08-30 LAB — VITAMIN B12: Vitamin B-12: 362 pg/mL (ref 211–911)

## 2014-08-30 LAB — OSMOLALITY: Osmolality: 267 mOsm/kg — ABNORMAL LOW (ref 275–300)

## 2014-08-30 MED ORDER — CEFUROXIME AXETIL 500 MG PO TABS
500.0000 mg | ORAL_TABLET | Freq: Two times a day (BID) | ORAL | Status: DC
  Administered 2014-08-31: 500 mg via ORAL
  Filled 2014-08-30 (×3): qty 1

## 2014-08-30 NOTE — Progress Notes (Signed)
Chart reviewed.   [facility] PROGRESS NOTE  Leah Payne WUJ:[id number] DOB: 03-25-1946 DOA: 08/29/2014 PCP: Ralene OkMOREIRA,ROY, MD  Assessment/Plan: Chart reviewed  Principal Problem:   Hyponatremia improved. Await TSH Active Problems:   Seizure disorder: continue keppra   Congenital hydrocephalus   Hypertension: controlled. Continue current   Diabetes mellitus type 2 in obese: SSI   Hyperlipidemia   UTI (lower urinary tract infection): change to po abx   Overactive bladder   Malaise and fatigue: pt eval   Hydrocephalus in adult  Home tomorrow if stable  HPI/Subjective:   Objective: Filed Vitals:   08/30/14 0500  BP: 131/76  Pulse: 71  Temp: 98 F (36.7 C)  Resp: 18    Intake/Output Summary (Last 24 hours) at 08/30/14 1321 Last data filed at 08/30/14 0955  Gross per 24 hour  Intake    700 ml  Output   1500 ml  Net   -800 ml   Filed Weights   08/29/14 1700 08/30/14 0500  Weight: 65.318 kg (144 lb) 67.631 kg (149 lb 1.6 oz)    Exam:   General:    Cardiovascular:   Respiratory:   Abdomen:   Ext:   Basic Metabolic Panel:  Recent Labs Lab 08/29/14 0955 08/30/14 0407  NA 128* 134*  K 3.8 4.1  CL 98 104  CO2 26 25  GLUCOSE 121* 97  BUN 6 8  CREATININE 0.76 0.69  CALCIUM 8.5 8.3*   Liver Function Tests:  Recent Labs Lab 08/29/14 0955  AST 28  ALT 26  ALKPHOS 65  BILITOT 0.5  PROT 6.6  ALBUMIN 3.6   No results for input(s): LIPASE, AMYLASE in the last 168 hours. No results for input(s): AMMONIA in the last 168 hours. CBC:  Recent Labs Lab 08/29/14 0955  WBC 4.4  NEUTROABS 2.4  HGB 11.8*  HCT 35.1*  MCV 85.2  PLT 194   Cardiac Enzymes: No results for input(s): CKTOTAL, CKMB, CKMBINDEX, TROPONINI in the last 168 hours. BNP (last 3 results)  Recent Labs  08/29/14 0955  BNP 266.0*    ProBNP (last 3 results) No results for input(s): PROBNP in the last 8760 hours.  CBG:  Recent Labs Lab 08/29/14 1735  08/29/14 2140 08/29/14 2156 08/30/14 0746 08/30/14 1149  GLUCAP 92 111* 161* 78 111*    No results found for this or any previous visit (from the past 240 hour(s)).   Studies: Dg Chest 2 View  08/29/2014   CLINICAL DATA:  Dizziness, weakness, shortness of breath  EXAM: CHEST  2 VIEW  COMPARISON:  08/04/2013  FINDINGS: There is mild bilateral interstitial thickening. There is no pleural effusion or pneumothorax. There is no focal consolidation. There is enlargement of the central pulmonary vasculature. The heart mediastinum are stable. There is no acute osseous abnormality.  IMPRESSION: 1. Mild bilateral interstitial thickening concerning for mild interstitial edema versus infection.   Electronically Signed   By: Elige KoHetal  Patel   On: 08/29/2014 11:08    Scheduled Meds: . atorvastatin  40 mg Oral QPM  . cefTRIAXone (ROCEPHIN)  IV  1 g Intravenous Q24H  . digoxin  0.125 mg Oral Daily  . docusate sodium  100 mg Oral BID  . fesoterodine  8 mg Oral QHS  . heparin  5,000 Units Subcutaneous 3 times per day  . insulin aspart  0-15 Units Subcutaneous TID WC  . insulin aspart  0-5 Units Subcutaneous QHS  . levETIRAcetam  500 mg Oral BID  . lisinopril  20 mg Oral Daily  . mirabegron ER  50 mg Oral QHS  . naproxen  500 mg Oral BID WC  . pantoprazole  40 mg Oral Daily  . propranolol  10 mg Oral BID   Continuous Infusions:   Time spent: 35 minutes  Azreal Stthomas L  [facility] [email or web address], password [email or web address] 08/30/2014, 1:21 PM  LOS: 1 day

## 2014-08-31 LAB — BASIC METABOLIC PANEL
Anion gap: 7 (ref 5–15)
BUN: 9 mg/dL (ref 6–23)
CO2: 27 mmol/L (ref 19–32)
Calcium: 8.4 mg/dL (ref 8.4–10.5)
Chloride: 105 mmol/L (ref 96–112)
Creatinine, Ser: 0.74 mg/dL (ref 0.50–1.10)
GFR calc Af Amer: >90 mL/min (ref >90)
GFR calc non Af Amer: 85 mL/min — ABNORMAL LOW (ref >90)
Glucose, Bld: 121 mg/dL — ABNORMAL HIGH (ref 70–99)
Potassium: 3.8 mmol/L (ref 3.5–5.1)
Sodium: 139 mmol/L (ref 135–145)

## 2014-08-31 LAB — URINE CULTURE: Colony Count: 75000

## 2014-08-31 LAB — GLUCOSE, CAPILLARY
Glucose-Capillary: 100 mg/dL — ABNORMAL HIGH (ref 70–99)
Glucose-Capillary: 123 mg/dL — ABNORMAL HIGH (ref 70–99)

## 2014-08-31 MED ORDER — LEVOTHYROXINE SODIUM 25 MCG PO TABS: 25.0000 ug | ORAL_TABLET | Freq: Every day | ORAL | Status: DC

## 2014-08-31 MED ORDER — LEVOTHYROXINE SODIUM 25 MCG PO TABS
25.0000 ug | ORAL_TABLET | Freq: Every day | ORAL | Status: DC
  Administered 2014-08-31: 25 ug via ORAL
  Filled 2014-08-31: qty 1

## 2014-08-31 MED ORDER — CEFUROXIME AXETIL 500 MG PO TABS: 500.0000 mg | ORAL_TABLET | Freq: Two times a day (BID) | ORAL | Status: DC

## 2014-08-31 NOTE — Progress Notes (Signed)
CARE MANAGEMENT NOTE 08/31/2014  Patient:  Leah Payne, Leah Payne   Account Number:  [id number]  Date Initiated:  08/31/2014  Documentation initiated by:  [facility]  Subjective/Objective Assessment:   [id number]     Action/Plan:   discharge planning   Anticipated DC Date:  08/31/2014   Anticipated DC Plan:  [place]  CM consult      [facility] Choice  HOME HEALTH   Choice offered to / List presented to:  C-1 Patient   DME arranged  Vassie Moselle      DME agency  [place] arranged  [place].   Status of service:  Completed, signed off Medicare Important Message given?   (If response is "NO", the following Medicare IM given date fields will be blank) Date Medicare IM given:   Medicare IM given by:   Date Additional Medicare IM given:   Additional Medicare IM given by:    Discharge Disposition:  [place]  Per UR Regulation:    If discussed at Long Length of Stay Meetings, dates discussed:    Comments:  08/31/14 11:52 CM met wit pt in room to offer home health agency.  Pt chooses [facility] to render HHPT.  Address and contact information verified with pt.  Referral called to [facility] rep, Stephanie.  CM called DME rep, Jeneen Rinks to please delier the rolling walker to room prior to discharge.  No other CM needs were communicated.  Mariane Masters, BSN, CM [phone].

## 2014-08-31 NOTE — Discharge Summary (Signed)
Physician Discharge Summary  Leah Payne ZOX:[id number] DOB: 01/27/1946 DOA: 08/29/2014  PCP: Ralene Ok, MD  Admit date: 08/29/2014 Discharge date: 08/31/2014  Time spent: greater than 30 minutes  Recommendations for Outpatient Follow-up:  Monitor sodium Monitor TSH Home PT arranged  Discharge Diagnoses:  Principal Problem:   Hyponatremia Active Problems: hyponatremia   Seizure disorder   Congenital hydrocephalus   Hypertension   Diabetes mellitus type 2 in obese   Hyperlipidemia   UTI (lower urinary tract infection)   Overactive bladder   Malaise and fatigue   Weakness   Discharge Condition: stable  Filed Weights   08/29/14 1700 08/30/14 0500  Weight: 65.318 kg (144 lb) 67.631 kg (149 lb 1.6 oz)    History of present illness:  69 y.o. female, with past medical history of tachycardia on beta blockers and digoxin followed by Dr. Donnie Aho as an outpatient, diabetes mellitus on metformin, congenital hydrocephalus as well as prior seizure disorder. She presented to the ER with complaints of generalized weakness for several days and mouth dryness. Upon evaluation in the ER she was found to be hyponatremic with a sodium of 128. In addition her urinalysis was abnormal concerning for UTI. She was started on IV fluids and given an empiric dose of Rocephin IV. Upon our evaluation the patient and the husband admitted that the patient typically drinks large volumes of water and keeps a water bottle by her bedside. She complains of excessive thirst. Patient noted to be on multiple medications for overactive bladder including [name] which can cause dry mouth. Patient has also been experiencing generalized fatigue and malaise greater than 3 months. She does not have any chest pain either at rest or with exertion and no exertional shortness of breath. She has not noticed any dark or bloody stools or emesis. She has not had any change in weight or appetite. She typically uses [employer]  along with the water to assist with her chronic dry mouth.  Hospital Course:  Started on antibiotics. TSH > 4. May be contributing to hyponatremia, weakness and malaise. Started on synthroid. Worked with PT who recommend home PT. This has been arranged. By discharge, sodium 139, patient feeling stronger and ready for discharge.  Procedures:  none  Consultations:  none  Discharge Exam: Filed Vitals:   08/31/14 0546  BP: 147/83  Pulse: 77  Temp: 98.1 F (36.7 C)  Resp: 19    General: comfortable, a and o Cardiovascular: rrr without MGR Respiratory: CTA without WRR  Discharge Instructions   Discharge Instructions    Diet Carb Modified    Complete by:  As directed      Walker     Complete by:  As directed           Current Discharge Medication List    START taking these medications   Details  cefUROXime (CEFTIN) 500 MG tablet Take 1 tablet (500 mg total) by mouth 2 (two) times daily with a meal. Until gone Qty: 3 tablet, Refills: 0    levothyroxine (SYNTHROID, LEVOTHROID) 25 MCG tablet Take 1 tablet (25 mcg total) by mouth daily before breakfast. Qty: 30 tablet, Refills: 1      CONTINUE these medications which have NOT CHANGED   Details  atorvastatin (LIPITOR) 40 MG tablet 40 mg every evening.     DIGOX 125 MCG tablet 0.125 mg daily.     esomeprazole (NEXIUM) 40 MG capsule Take 40 mg by mouth at bedtime.    levETIRAcetam (KEPPRA) 500 MG tablet  Take 1 tablet (500 mg total) by mouth 2 (two) times daily. Qty: 60 tablet, Refills: 0    lisinopril (PRINIVIL,ZESTRIL) 20 MG tablet 20 mg daily.     metFORMIN (GLUCOPHAGE) 500 MG tablet 500 mg daily with breakfast.     [name] 50 MG TB24 tablet 50 mg at bedtime.     propranolol (INDERAL) 10 MG tablet 10 mg 2 (two) times daily.     lidocaine (XYLOCAINE) 5 % ointment 1 application 2 (two) times daily as needed for mild pain.     naproxen sodium (ANAPROX) 220 MG tablet Take 440 mg by mouth 2 (two) times daily  with a meal.      STOP taking these medications     [name] 8 MG TB24 tablet        No Known Allergies Follow-up Information    Follow up with Ralene Ok, MD In 2 weeks.   Specialty:  Internal Medicine   Contact information:   [address] [place] [address] [phone]        The results of significant diagnostics from this hospitalization (including imaging, microbiology, ancillary and laboratory) are listed below for reference.    Significant Diagnostic Studies: Dg Chest 2 View  08/29/2014   CLINICAL DATA:  Dizziness, weakness, shortness of breath  EXAM: CHEST  2 VIEW  COMPARISON:  08/04/2013  FINDINGS: There is mild bilateral interstitial thickening. There is no pleural effusion or pneumothorax. There is no focal consolidation. There is enlargement of the central pulmonary vasculature. The heart mediastinum are stable. There is no acute osseous abnormality.  IMPRESSION: 1. Mild bilateral interstitial thickening concerning for mild interstitial edema versus infection.   Electronically Signed   By: Elige Ko   On: 08/29/2014 11:08    Microbiology: Recent Results (from the past 240 hour(s))  Culture, Urine     Status: None   Collection Time: 08/29/14 12:06 PM  Result Value Ref Range Status   Specimen Description URINE, RANDOM  Final   Special Requests NONE  Final   Colony Count   Final    75,000 COLONIES/ML Performed at [facility]    Culture   Final    Multiple bacterial morphotypes present, none predominant. Suggest appropriate recollection if clinically indicated. Performed at [facility]    Report Status 08/31/2014 FINAL  Final     Labs: Basic Metabolic Panel:  Recent Labs Lab 08/29/14 0955 08/30/14 0407 08/31/14 0430  NA 128* 134* 139  K 3.8 4.1 3.8  CL 98 104 105  CO2 GLUCOSE 121* 97 121*  BUN CREATININE 0.76 0.69 0.74  CALCIUM 8.5 8.3* 8.4   Liver Function Tests:  Recent Labs Lab 08/29/14 0955  AST  28  ALT 26  ALKPHOS 65  BILITOT 0.5  PROT 6.6  ALBUMIN 3.6   No results for input(s): LIPASE, AMYLASE in the last 168 hours. No results for input(s): AMMONIA in the last 168 hours. CBC:  Recent Labs Lab 08/29/14 0955  WBC 4.4  NEUTROABS 2.4  HGB 11.8*  HCT 35.1*  MCV 85.2  PLT 194   Cardiac Enzymes: No results for input(s): CKTOTAL, CKMB, CKMBINDEX, TROPONINI in the last 168 hours. BNP: BNP (last 3 results)  Recent Labs  08/29/14 0955  BNP 266.0*    ProBNP (last 3 results) No results for input(s): PROBNP in the last 8760 hours.  CBG:  Recent Labs Lab 08/30/14 0746 08/30/14 1149 08/30/14 1723 08/30/14 2154 08/31/14 0750  GLUCAP 78 111* 128* 112* 100*       Signed:  Reyanna Baley L  [facility] 08/31/2014, 11:09 AM

## 2014-08-31 NOTE — Progress Notes (Signed)
Discharge instructions gone over with patient and spouse. Home medications gone over. Prescriptions given. Follow up appointment to be made. Home health to follow patient for physical therapy. Rolling walker delivered to room. Diet and activity discussed. Patient and spouse verbalized understanding of instructions.

## 2014-08-31 NOTE — Progress Notes (Signed)
Physical Therapy Evaluation Patient Details Name: Leah Payne MRN: [id number] DOB: 05-08-1946 Today's Date: 08/31/2014   History of Present Illness  Patient is a 69 yo female admitted 08/29/14 with weakness, hyponatremia with excess fluid volume, UTI.  PMH:  hydrocephalus, tachycardia, DM, seizures  Clinical Impression  Patient presents with problems listed below.  Will benefit from acute PT to maximize independence prior to discharge home with husband.  Patient with slightly unsteady gait. Recommend f/u HHPT at discharge.    Follow Up Recommendations Home health PT;Supervision - Intermittent    Equipment Recommendations  Rolling walker with 5" wheels (will continue to assess)    Recommendations for Other Services       Precautions / Restrictions Precautions Precautions: Fall Restrictions Weight Bearing Restrictions: No      Mobility  Bed Mobility Overal bed mobility: Independent                Transfers Overall transfer level: Needs assistance Equipment used: Straight cane Transfers: Sit to/from Stand Sit to Stand: Min guard         General transfer comment: Patient using correct technique with cane.  Assist for balance/safety - patient reports dizziness with standing.  Ambulation/Gait Ambulation/Gait assistance: Min guard Ambulation Distance (Feet): 62 Feet Assistive device: Straight cane Gait Pattern/deviations: Step-through pattern;Decreased step length - right;Decreased step length - left;Shuffle Gait velocity: Decreased Gait velocity interpretation: Below normal speed for age/gender General Gait Details: Patient with slightly unsteady gait that improved with distance walked.  Reports dizziness with gait initially - improves with time.  Stairs            Wheelchair Mobility    Modified Rankin (Stroke Patients Only)       Balance Overall balance assessment: Needs assistance         Standing balance support: Single extremity  supported Standing balance-Leahy Scale: Fair               High level balance activites: Head turns High Level Balance Comments: Decreased balance with head turns during gait.             Pertinent Vitals/Pain Pain Assessment: No/denies pain    Home Living Family/patient expects to be discharged to:: Private residence Living Arrangements: Spouse/significant other Available Help at Discharge: Family;Available PRN/intermittently (Husband works, brother lives next door) Type of Home: House Home Access: Stairs to enter Entrance Stairs-Rails: None Secretary/administrator of Steps: 1 Home Layout: One level Home Equipment: [employer] - single point      Prior Function Level of Independence: Independent with assistive device(s)         Comments: Uses cane for ambulation     Hand Dominance        Extremity/Trunk Assessment   Upper Extremity Assessment: Overall WFL for tasks assessed           Lower Extremity Assessment: Generalized weakness         Communication   Communication: No difficulties  Cognition Arousal/Alertness: Awake/alert Behavior During Therapy: WFL for tasks assessed/performed Overall Cognitive Status: Within Functional Limits for tasks assessed                      General Comments      Exercises        Assessment/Plan    PT Assessment Patient needs continued PT services  PT Diagnosis Abnormality of gait;Generalized weakness   PT Problem List Decreased strength;Decreased balance;Decreased mobility  PT Treatment Interventions Gait training;DME instruction;Functional mobility training;Therapeutic activities;Balance  training;Patient/family education   PT Goals (Current goals can be found in the Care Plan section) Acute Rehab PT Goals Patient Stated Goal: To go home soon PT Goal Formulation: With patient Time For Goal Achievement: 09/07/14 Potential to Achieve Goals: Good    Frequency Min 3X/week   Barriers to discharge  Decreased caregiver support Patient alone part of the day - husband works.    Co-evaluation               End of Session Equipment Utilized During Treatment: Gait belt Activity Tolerance: Patient tolerated treatment well Patient left: in chair;with call bell/phone within reach Nurse Communication: Mobility status         Time: [id number] PT Time Calculation (min) (ACUTE ONLY): 15 min   Charges:   PT Evaluation $Initial PT Evaluation Tier I: 1 Procedure     PT G CodesVena Payne:        Leah Payne H 08/31/2014, 10:15 AM Leah Payne, [facility], [facility] Pager [phone]

## 2014-12-11 LAB — TESTOSTERONE, TOTAL, LC/MS/MS: Testosterone,Total,LC/MS/MS: 36

## 2014-12-11 LAB — CBC
HGB: 13.8 g/dL
WBC: 5.1
platelet count: 285

## 2014-12-24 LAB — HM DIABETES EYE EXAM

## 2015-03-27 LAB — COMPREHENSIVE METABOLIC PANEL
ALT: 29
AST: 21 U/L
Alkaline Phosphatase: 70 U/L
Total Bilirubin: 0.7 mg/dL

## 2015-03-27 LAB — LIPID PANEL
Cholesterol: 146
HDL: 34
LDL (calc): 101
Triglycerides: 215

## 2015-03-27 LAB — HEMOGLOBIN A1C: A1c: 6.2

## 2015-09-17 ENCOUNTER — Ambulatory Visit (INDEPENDENT_AMBULATORY_CARE_PROVIDER_SITE_OTHER): Payer: Medicare HMO | Admitting: Family Medicine

## 2015-09-17 ENCOUNTER — Encounter: Payer: Self-pay | Admitting: Family Medicine

## 2015-09-17 VITALS — BP 144/88 | HR 74 | Temp 98.0°F | Wt 142.8 lb

## 2015-09-17 DIAGNOSIS — E1142 Type 2 diabetes mellitus with diabetic polyneuropathy: Secondary | ICD-10-CM | POA: Insufficient documentation

## 2015-09-17 DIAGNOSIS — E119 Type 2 diabetes mellitus without complications: Secondary | ICD-10-CM | POA: Diagnosis not present

## 2015-09-17 DIAGNOSIS — E785 Hyperlipidemia, unspecified: Secondary | ICD-10-CM

## 2015-09-17 DIAGNOSIS — Q039 Congenital hydrocephalus, unspecified: Secondary | ICD-10-CM

## 2015-09-17 DIAGNOSIS — N3281 Overactive bladder: Secondary | ICD-10-CM

## 2015-09-17 DIAGNOSIS — Q799 Congenital malformation of musculoskeletal system, unspecified: Secondary | ICD-10-CM

## 2015-09-17 DIAGNOSIS — Q749 Unspecified congenital malformation of limb(s): Secondary | ICD-10-CM

## 2015-09-17 DIAGNOSIS — G40909 Epilepsy, unspecified, not intractable, without status epilepticus: Secondary | ICD-10-CM | POA: Diagnosis not present

## 2015-09-17 DIAGNOSIS — I10 Essential (primary) hypertension: Secondary | ICD-10-CM

## 2015-09-17 DIAGNOSIS — E039 Hypothyroidism, unspecified: Secondary | ICD-10-CM

## 2015-09-17 MED ORDER — MECLIZINE HCL 12.5 MG PO TABS: 12.5000 mg | ORAL_TABLET | Freq: Three times a day (TID) | ORAL | Status: DC | PRN

## 2015-09-17 MED ORDER — MIRABEGRON ER 50 MG PO TB24: 50.0000 mg | ORAL_TABLET | Freq: Every day | ORAL | Status: DC

## 2015-09-17 MED ORDER — LEVETIRACETAM 500 MG PO TABS: 500.0000 mg | ORAL_TABLET | Freq: Two times a day (BID) | ORAL | Status: DC

## 2015-09-17 NOTE — Assessment & Plan Note (Signed)
Continue atorvastatin . Check LDL today.

## 2015-09-17 NOTE — Assessment & Plan Note (Signed)
Refilled keppra. Encouraged compliance.

## 2015-09-17 NOTE — Progress Notes (Signed)
BP 144/88 mmHg  Pulse 74  Temp(Src) 98 F (36.7 C) (Oral)  Wt 142 lb 12 oz (64.751 kg)  SpO2 97%   CC: new pt to establish care  Subjective:    Patient ID: Leah Payne, female    DOB: 1945-10-19, 70 y.o.   MRN: [id number]  HPI: Leah Payne is a 70 y.o. female presenting on 09/17/2015 for Establish Care   Presents with husband today. Signed ROI for records from prior PCP. Prior saw Dr. Lilli Light, cardiologist is Dr Donnie Aho - h/o fast and weak pulse on propranolol and digoxin for this. 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 Does not see neurologist. 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   H/o congenital hydrocephalus. H/o seizure 07/2013, controlled on keppra  bid. Ran out of keppra 1 wk ago. Stays dizzy. Since seizure, also with significant memory trouble. Husband feels she seems distant but pt denies personality or behavior changes.   H/o hyponatremia with hospitalization 2016.   Hypothyroidism - controlled on levothyroxine daily.   Urinary incontinence - controlled on myrbetriq and toviaz. This started 1989.   DM - regularly does not check sugars. Compliant with antihyperglycemic regimen which includes: metformin  daily with breakfast. Denies low sugars or hypoglycemic symptoms. Denies paresthesias. Last diabetic eye exam 12/2014.  Pneumovax: thinks done. Prevnar: thinks done. Lab Results  Component Value Date   HGBA1C 6.8* 08/29/2014   Diabetic Foot Exam - Simple   Simple Foot Form  Diabetic Foot exam was performed with the following findings:  Yes 09/17/2015  3:17 PM  Visual Inspection  No deformities, no ulcerations, no other skin breakdown bilaterally:  Yes  Sensation Testing  See comments:  Yes  Pulse Check  See comments:  Yes  Comments  Mildly diminished pulses bilaterally Diminished sensation to monofilament testing      Lives with husband, 2 dogs Occupation: retired, was housewife Edu: HS Activity: no regular exercise. Dizziness  precludes activity Diet: good water, fruits/vegetables daily  Relevant past medical, surgical, family and social history reviewed and updated as indicated. Interim medical history since our last visit reviewed. Allergies and medications reviewed and updated. Current Outpatient Prescriptions on File Prior to Visit  Medication Sig  . atorvastatin (LIPITOR) 40 MG tablet 40 mg every evening.   Marland Kitchen DIGOX 125 MCG tablet 0.125 mg daily.   Marland Kitchen levothyroxine (SYNTHROID, LEVOTHROID) 25 MCG tablet Take 1 tablet (25 mcg total) by mouth daily before breakfast.  . metFORMIN (GLUCOPHAGE) 500 MG tablet 500 mg daily with breakfast.   . propranolol (INDERAL) 10 MG tablet 10 mg 2 (two) times daily.    No current facility-administered medications on file prior to visit.    Review of Systems Per HPI unless specifically indicated in ROS section     Objective:    BP 144/88 mmHg  Pulse 74  Temp(Src) 98 F (36.7 C) (Oral)  Wt 142 lb 12 oz (64.751 kg)  SpO2 97%  Wt Readings from Last 3 Encounters:  09/17/15 142 lb 12 oz (64.751 kg)  08/30/14 149 lb 1.6 oz (67.631 kg)  08/04/13 146 lb (66.225 kg)   Body mass index is 26.1 kg/(m^2).  Physical Exam  Constitutional: She appears well-developed and well-nourished. No distress.  Walks   with cane  HENT:  Head: Normocephalic and atraumatic.  Right Ear: External ear normal.  Left Ear: External ear normal.  Nose: Nose normal.  Mouth/Throat: Oropharynx is clear and moist. No oropharyngeal exudate.  Frontal enlargement  Eyes:  Conjunctivae and EOM are normal. Pupils are equal, round, and reactive to light. No scleral icterus.  Neck: Normal range of motion. Neck supple.  Cardiovascular: Normal rate, regular rhythm, normal heart sounds and intact distal pulses.   No murmur heard. Pulmonary/Chest: Effort normal and breath sounds normal. No respiratory distress. She has no wheezes. She has no rales.  Musculoskeletal: She exhibits no edema.  See HPI for foot exam  if done Extra thumb R hand Inward deformity of feet R>L  Lymphadenopathy:    She has no cervical adenopathy.  Neurological:  Unsteady on feet  Skin: Skin is warm and dry. No rash noted.  Coarse hair on lips  Psychiatric: She has a normal mood and affect.  Nursing note and vitals reviewed.   MRI HEAD WITHOUT AND WITH CONTRAST IMPRESSION: 1. No acute intracranial abnormality. 2. 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 3. Stable nonspecific cerebral white matter signal changes. Electronically Signed  By: Augusto Gamble M.D.  On: 08/06/2013 12:56    Assessment & Plan:   Problem List Items Addressed This Visit    Thyroid activity decreased    Continue low dose levothyroxine. Check TSH today.      Relevant Orders   TSH   Seizure disorder (HCC)    Refilled keppra. Encouraged compliance.      Overactive bladder    Await records. Pt/husband endorse incontinence now well controlled on [place] and myrbetriq. ?ventriculomegaly related.       Malformation of bone and joint    Await records to review.       Hypertension    Mildly elevated today. Off bp regimen. Reassess next visit.      Hyperlipidemia    Continue atorvastatin . Check LDL today.      Relevant Orders   LDL Cholesterol, Direct   Controlled type 2 diabetes mellitus without complication, without long-term current use of insulin (HCC) - Primary    Check A1c today. Foot exam today.      Relevant Orders   Hemoglobin A1c   Basic metabolic panel   Microalbumin / creatinine urine ratio   LDL Cholesterol, Direct   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HCC)    Continue to monitor.          Follow up plan: Return in about 3 months (around 12/15/2015), or as needed, for follow up visit.

## 2015-09-17 NOTE — Assessment & Plan Note (Signed)
Continue to monitor

## 2015-09-17 NOTE — Assessment & Plan Note (Signed)
Check A1c today. Foot exam today. 

## 2015-09-17 NOTE — Assessment & Plan Note (Signed)
Await records to review 

## 2015-09-17 NOTE — Assessment & Plan Note (Signed)
Continue low dose levothyroxine. Check TSH today.  

## 2015-09-17 NOTE — Patient Instructions (Addendum)
Change meclizine to three times daily as needed for dizziness. Labs today Good to see you today, call us with questions. Return in 2-3 months for geriatric assessment.

## 2015-09-17 NOTE — Assessment & Plan Note (Signed)
Await records. Pt/husband endorse incontinence now well controlled on [place] and myrbetriq. ?ventriculomegaly related.

## 2015-09-17 NOTE — Assessment & Plan Note (Signed)
Mildly elevated today. Off bp regimen. Reassess next visit.

## 2015-09-17 NOTE — Progress Notes (Signed)
Pre visit review using our clinic review tool, if applicable. No additional management support is needed unless otherwise documented below in the visit note. 

## 2015-09-18 LAB — BASIC METABOLIC PANEL
BUN: 6 mg/dL (ref 6–23)
CO2: 29 mEq/L (ref 19–32)
Calcium: 10.1 mg/dL (ref 8.4–10.5)
Chloride: 99 mEq/L (ref 96–112)
Creatinine, Ser: 0.62 mg/dL (ref 0.40–1.20)
GFR: 101.02 mL/min (ref >60.00)
Glucose, Bld: 85 mg/dL (ref 70–99)
Potassium: 3.9 mEq/L (ref 3.5–5.1)
Sodium: 135 mEq/L (ref 135–145)

## 2015-09-18 LAB — HEMOGLOBIN A1C: Hgb A1c MFr Bld: 6.7 % — ABNORMAL HIGH (ref 4.6–6.5)

## 2015-09-18 LAB — LDL CHOLESTEROL, DIRECT: Direct LDL: 100 mg/dL

## 2015-09-18 LAB — MICROALBUMIN / CREATININE URINE RATIO
Creatinine,U: 17.9 mg/dL
Microalb Creat Ratio: 3.9 mg/g (ref 0.0–30.0)
Microalb, Ur: <0.7 mg/dL (ref 0.0–1.9)

## 2015-09-18 LAB — TSH: TSH: 1.67 u[IU]/mL (ref 0.35–4.50)

## 2015-09-19 ENCOUNTER — Encounter: Payer: Self-pay | Admitting: Family Medicine

## 2015-09-19 DIAGNOSIS — Q039 Congenital hydrocephalus, unspecified: Secondary | ICD-10-CM

## 2015-09-19 DIAGNOSIS — I456 Pre-excitation syndrome: Secondary | ICD-10-CM | POA: Insufficient documentation

## 2015-09-19 DIAGNOSIS — Q749 Unspecified congenital malformation of limb(s): Secondary | ICD-10-CM

## 2015-09-19 DIAGNOSIS — Q799 Congenital malformation of musculoskeletal system, unspecified: Secondary | ICD-10-CM

## 2015-09-21 ENCOUNTER — Encounter: Payer: Self-pay | Admitting: *Deleted

## 2015-09-24 ENCOUNTER — Encounter: Payer: Self-pay | Admitting: *Deleted

## 2015-09-29 ENCOUNTER — Encounter: Payer: Self-pay | Admitting: Family Medicine

## 2015-10-13 ENCOUNTER — Encounter (HOSPITAL_COMMUNITY): Payer: Self-pay | Admitting: Neurology

## 2015-10-13 ENCOUNTER — Emergency Department (HOSPITAL_COMMUNITY)
Admission: EM | Admit: 2015-10-13 | Discharge: 2015-10-13 | Disposition: A | Discharge status: Home / Self Care | Payer: Medicare HMO | Attending: Emergency Medicine | Admitting: Emergency Medicine

## 2015-10-13 DIAGNOSIS — Z7984 Long term (current) use of oral hypoglycemic drugs: Secondary | ICD-10-CM | POA: Diagnosis not present

## 2015-10-13 DIAGNOSIS — E785 Hyperlipidemia, unspecified: Secondary | ICD-10-CM | POA: Insufficient documentation

## 2015-10-13 DIAGNOSIS — Q759 Congenital malformation of skull and face bones, unspecified: Secondary | ICD-10-CM | POA: Insufficient documentation

## 2015-10-13 DIAGNOSIS — E119 Type 2 diabetes mellitus without complications: Secondary | ICD-10-CM | POA: Insufficient documentation

## 2015-10-13 DIAGNOSIS — K529 Noninfective gastroenteritis and colitis, unspecified: Secondary | ICD-10-CM

## 2015-10-13 DIAGNOSIS — A084 Viral intestinal infection, unspecified: Secondary | ICD-10-CM | POA: Diagnosis not present

## 2015-10-13 DIAGNOSIS — G40909 Epilepsy, unspecified, not intractable, without status epilepticus: Secondary | ICD-10-CM | POA: Diagnosis not present

## 2015-10-13 DIAGNOSIS — Z79899 Other long term (current) drug therapy: Secondary | ICD-10-CM | POA: Insufficient documentation

## 2015-10-13 DIAGNOSIS — Q691 Accessory thumb(s): Secondary | ICD-10-CM | POA: Insufficient documentation

## 2015-10-13 DIAGNOSIS — R112 Nausea with vomiting, unspecified: Secondary | ICD-10-CM | POA: Diagnosis present

## 2015-10-13 DIAGNOSIS — Z8679 Personal history of other diseases of the circulatory system: Secondary | ICD-10-CM | POA: Insufficient documentation

## 2015-10-13 LAB — COMPREHENSIVE METABOLIC PANEL
ALT: 25 U/L (ref 14–54)
AST: 23 U/L (ref 15–41)
Albumin: 3.9 g/dL (ref 3.5–5.0)
Alkaline Phosphatase: 73 U/L (ref 38–126)
Anion gap: 11 (ref 5–15)
BUN: 8 mg/dL (ref 6–20)
CO2: 24 mmol/L (ref 22–32)
Calcium: 8.8 mg/dL — ABNORMAL LOW (ref 8.9–10.3)
Chloride: 104 mmol/L (ref 101–111)
Creatinine, Ser: 0.7 mg/dL (ref 0.44–1.00)
GFR calc Af Amer: >60 mL/min (ref >60)
GFR calc non Af Amer: >60 mL/min (ref >60)
Glucose, Bld: 149 mg/dL — ABNORMAL HIGH (ref 65–99)
Potassium: 4.1 mmol/L (ref 3.5–5.1)
Sodium: 139 mmol/L (ref 135–145)
Total Bilirubin: 0.8 mg/dL (ref 0.3–1.2)
Total Protein: 7 g/dL (ref 6.5–8.1)

## 2015-10-13 LAB — LIPASE, BLOOD: Lipase: 28 U/L (ref 11–51)

## 2015-10-13 LAB — CBC
HCT: 40.9 % (ref 36.0–46.0)
Hemoglobin: 13.5 g/dL (ref 12.0–15.0)
MCH: 28.2 pg (ref 26.0–34.0)
MCHC: 33 g/dL (ref 30.0–36.0)
MCV: 85.6 fL (ref 78.0–100.0)
Platelets: 200 10*3/uL (ref 150–400)
RBC: 4.78 MIL/uL (ref 3.87–5.11)
RDW: 12.2 % (ref 11.5–15.5)
WBC: 5.5 10*3/uL (ref 4.0–10.5)

## 2015-10-13 LAB — I-STAT CG4 LACTIC ACID, ED: Lactic Acid, Venous: 1.48 mmol/L (ref 0.5–2.0)

## 2015-10-13 MED ORDER — DIPHENOXYLATE-ATROPINE 2.5-0.025 MG PO TABS
2.0000 | ORAL_TABLET | Freq: Once | ORAL | Status: AC
  Administered 2015-10-13: 2 via ORAL
  Filled 2015-10-13 (×2): qty 2

## 2015-10-13 MED ORDER — PANTOPRAZOLE SODIUM 40 MG IV SOLR
40.0000 mg | Freq: Once | INTRAVENOUS | Status: AC
  Administered 2015-10-13: 40 mg via INTRAVENOUS
  Filled 2015-10-13: qty 40

## 2015-10-13 MED ORDER — ONDANSETRON 4 MG PO TBDP: 4.0000 mg | ORAL_TABLET | Freq: Three times a day (TID) | ORAL | Status: DC | PRN

## 2015-10-13 MED ORDER — DIPHENOXYLATE-ATROPINE 2.5-0.025 MG PO TABS: 2.0000 | ORAL_TABLET | Freq: Once | ORAL | Status: DC

## 2015-10-13 MED ORDER — ONDANSETRON 4 MG PO TBDP
4.0000 mg | ORAL_TABLET | Freq: Once | ORAL | Status: AC | PRN
  Administered 2015-10-13: 4 mg via ORAL

## 2015-10-13 MED ORDER — ONDANSETRON HCL 4 MG/2ML IJ SOLN
4.0000 mg | Freq: Once | INTRAMUSCULAR | Status: AC
  Administered 2015-10-13: 4 mg via INTRAVENOUS
  Filled 2015-10-13: qty 2

## 2015-10-13 MED ORDER — SODIUM CHLORIDE 0.9 % IV BOLUS (SEPSIS)
1000.0000 mL | Freq: Once | INTRAVENOUS | Status: AC
  Administered 2015-10-13: 1000 mL via INTRAVENOUS

## 2015-10-13 MED ORDER — DIPHENOXYLATE-ATROPINE 2.5-0.025 MG PO TABS: 1.0000 | ORAL_TABLET | Freq: Four times a day (QID) | ORAL | Status: DC | PRN

## 2015-10-13 MED ORDER — ONDANSETRON 4 MG PO TBDP
ORAL_TABLET | ORAL | Status: DC
Start: 2015-10-13 — End: 2015-10-14
  Filled 2015-10-13: qty 1

## 2015-10-13 NOTE — ED Notes (Signed)
Patient still in the bathroom at this time; will check back with her

## 2015-10-13 NOTE — ED Notes (Signed)
Pt reports n/v/d since this morning, yesterday had a migraine. Her husband was sick this weekend and she has gotten sick now also. Reports she feels weak, and can't keep anything down. Pt is a x 4.

## 2015-10-13 NOTE — ED Provider Notes (Signed)
CSN: [id number]     Arrival date & time 10/13/15  1203 History   First MD Initiated Contact with Patient 10/13/15 1918     Chief Complaint  Patient presents with  . Nausea  . Emesis  . Diarrhea      HPI  She presents for evaluation of nausea vomiting and diarrhea. States her husband had symptoms over the weekend with similar symptoms. She had diarrhea yesterday. The Vomiting Today. Has Not Vomited for 3 Hours upon Her Arrival to the Room.  Past Medical History  Diagnosis Date  . Tachycardia   . Hyperlipidemia 08/04/2013  . Diabetes mellitus type 2, controlled (HCC) 08/04/2013  . Seizure disorder (HCC) 08/04/2013  . Hydrocephalus in adult 08/29/2014    CT head showing obstructive hydrocephalus, 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) - saw Dr Gerlene FeeKritzer - no benefit from shunt placement   . Hyponatremia 08/29/2014  . History of chicken pox   . History of fainting   . Preaxial polydactyly of right hand congenital  . WPW (Wolff-Parkinson-White syndrome)     has seen Dr Donnie Ahoilley  . Anomaly of cranium     frontal bossing, enlarged cranium, adult strabismus   Past Surgical History  Procedure Laterality Date  . Cholecystectomy  1978   Family History  Problem Relation Age of Onset  . Stroke Mother 692  . CAD Father 4279  . Diabetes Neg Hx   . Cancer Maternal Aunt     lung (non-smoker)  . Alzheimer's disease Father    Social History  Substance Use Topics  . Smoking status: Never Smoker   . Smokeless tobacco: Never Used  . Alcohol Use: No   OB History    No data available     Review of Systems  Constitutional: Negative for fever, chills, diaphoresis, appetite change and fatigue.  HENT: Negative for mouth sores, sore throat and trouble swallowing.   Eyes: Negative for visual disturbance.  Respiratory: Negative for cough, chest tightness, shortness of breath and wheezing.   Cardiovascular: Negative for chest pain.  Gastrointestinal: Positive  for nausea, vomiting and diarrhea. Negative for abdominal pain and abdominal distention.  Endocrine: Negative for polydipsia, polyphagia and polyuria.  Genitourinary: Negative for dysuria, frequency and hematuria.  Musculoskeletal: Negative for gait problem.  Skin: Negative for color change, pallor and rash.  Neurological: Negative for dizziness, syncope, light-headedness and headaches.  Hematological: Does not bruise/bleed easily.  Psychiatric/Behavioral: Negative for behavioral problems and confusion.      Allergies  Review of patient's allergies indicates no known allergies.  Home Medications   Prior to Admission medications   Medication Sig Start Date End Date Taking? Authorizing Provider  atorvastatin (LIPITOR) 40 MG tablet 40 mg every evening.  07/27/13   Historical Provider, MD  DIGOX 125 MCG tablet 0.125 mg daily.  07/20/13   Historical Provider, MD  diphenoxylate-atropine (LOMOTIL) 2.5-0.025 MG tablet Take 1 tablet by mouth 4 (four) times daily as needed for diarrhea or loose stools. 10/13/15   Rolland PorterMark Paticia Moster, MD  fesoterodine ([name]) 8 MG TB24 tablet Take 8 mg by mouth daily.    Historical Provider, MD  levETIRAcetam (KEPPRA) 500 MG tablet Take 1 tablet (500 mg total) by mouth 2 (two) times daily. 09/17/15   Eustaquio BoydenJavier Gutierrez, MD  levothyroxine (SYNTHROID, LEVOTHROID) 25 MCG tablet Take 1 tablet (25 mcg total) by mouth daily before breakfast. 08/31/14   Christiane Haorinna L Sullivan, MD  meclizine (ANTIVERT) 12.5 MG tablet Take 1 tablet (12.5 mg total) by  mouth 3 (three) times daily as needed for dizziness. 09/17/15   Eustaquio Boyden, MD  metFORMIN (GLUCOPHAGE) 500 MG tablet 500 mg daily with breakfast.  07/12/13   Historical Provider, MD  mirabegron ER ([name]) 50 MG TB24 tablet Take 1 tablet (50 mg total) by mouth at bedtime. 09/17/15   Eustaquio Boyden, MD  ondansetron (ZOFRAN ODT) 4 MG disintegrating tablet Take 1 tablet (4 mg total) by mouth every 8 (eight) hours as needed for nausea. 10/13/15    Rolland Porter, MD  propranolol (INDERAL) 10 MG tablet 10 mg 2 (two) times daily.  07/27/13   Historical Provider, MD   BP 125/68 mmHg  Pulse 98  Temp(Src) 99.7 F (37.6 C) (Oral)  Resp 17  SpO2 97% Physical Exam  Constitutional: She is oriented to person, place, and time. She appears well-developed and well-nourished. No distress.  HENT:  Head: Normocephalic.  Eyes: Conjunctivae are normal. Pupils are equal, round, and reactive to light. No scleral icterus.  Neck: Normal range of motion. Neck supple. No thyromegaly present.  Cardiovascular: Normal rate and regular rhythm.  Exam reveals no gallop and no friction rub.   No murmur heard. Pulmonary/Chest: Effort normal and breath sounds normal. No respiratory distress. She has no wheezes. She has no rales.  Abdominal: Soft. Bowel sounds are normal. She exhibits no distension. There is no tenderness. There is no rebound.  Musculoskeletal: Normal range of motion.  Neurological: She is alert and oriented to person, place, and time.  Skin: Skin is warm and dry. No rash noted.  Psychiatric: She has a normal mood and affect. Her behavior is normal.    ED Course  Procedures (including critical care time) Labs Review Labs Reviewed  COMPREHENSIVE METABOLIC PANEL - Abnormal; Notable for the following:    Glucose, Bld 149 (*)    Calcium 8.8 (*)    All other components within normal limits  LIPASE, BLOOD  CBC  I-STAT CG4 LACTIC ACID, ED    Imaging Review No results found. I have personally reviewed and evaluated these images and lab results as part of my medical decision-making.   EKG Interpretation None      MDM   Final diagnoses:  Acute gastroenteritis  Viral gastroenteritis    Patient keeping fluids down. Hydrated. Had an episode of diarrhea here. Nonbloody. Plan will be home. Zofran, Lomotil, fluid hydration, primary care follow-up.    Rolland Porter, MD 10/13/15 2216

## 2015-10-13 NOTE — Discharge Instructions (Signed)

## 2015-10-13 NOTE — ED Notes (Signed)
Checked back with patient and she is still using the bathroom; will check back with her in a few minutes

## 2015-10-23 ENCOUNTER — Other Ambulatory Visit: Payer: Self-pay | Admitting: *Deleted

## 2015-10-23 MED ORDER — METFORMIN HCL 500 MG PO TABS: 500.0000 mg | ORAL_TABLET | Freq: Every day | ORAL | Status: DC

## 2015-10-23 NOTE — Telephone Encounter (Signed)
plz notify this was sent in. 

## 2015-10-23 NOTE — Telephone Encounter (Signed)
Pts husband contacted office and states pt recently established care with Dr Reece AgarG. Pt is needing a refill of metformin but it has never been prescribed by Dr Reece AgarG, and last Rx dated 07/2013. pls advise

## 2015-10-28 ENCOUNTER — Telehealth: Payer: Self-pay

## 2015-10-28 NOTE — Telephone Encounter (Signed)
Mr Leah Payne left v/m requesting [facility] of med [facility] (could not understand name of med on v/m). Left v/m requesting cb.

## 2015-11-12 NOTE — Telephone Encounter (Signed)
I called and spoke with pt and she does not know the name of med needed and if anything further needed pt will cb.

## 2015-11-18 ENCOUNTER — Other Ambulatory Visit: Payer: Self-pay | Admitting: Family Medicine

## 2015-11-26 ENCOUNTER — Other Ambulatory Visit: Payer: Self-pay

## 2015-11-26 MED ORDER — ATORVASTATIN CALCIUM 40 MG PO TABS: 40.0000 mg | ORAL_TABLET | Freq: Every evening | ORAL | Status: DC

## 2015-11-26 NOTE — Telephone Encounter (Signed)
[facility] request refill atorvastatin; pt seen 09/17/15; refilled per protocol.

## 2015-11-27 ENCOUNTER — Telehealth: Payer: Self-pay

## 2015-11-27 NOTE — Telephone Encounter (Signed)
Mr Leah Payne [facility](DPR signed ) request refill for Keppra;advised [facility] have available [facility]ills [facility]; [facility]. Mr Leah Payne said has [facility]ill [facility]. I spoke with Abby at [facility] and was told [facility] and [facility]. [facility] and refill for Keppra is available but I cannot request transfer. Abby at [facility] will get med transferred. Mr Leah Payne voiced understanding and was appreciative.

## 2015-12-08 ENCOUNTER — Other Ambulatory Visit: Payer: Self-pay | Admitting: *Deleted

## 2015-12-08 NOTE — Telephone Encounter (Signed)
Pt's spouse left message at Triage requesting refill of med. Spouse said they requested refill on Saturday at pharmacy but we haven't refilled it yet, I don't see a refill request but I can't tell if Dr. Reece AgarG has ever refilled med before, spouse request call back once Rx sent

## 2015-12-09 MED ORDER — PROPRANOLOL HCL 10 MG PO TABS: 10.0000 mg | ORAL_TABLET | Freq: Two times a day (BID) | ORAL | Status: DC

## 2015-12-09 NOTE — Telephone Encounter (Signed)
Pt's husband was notified.  

## 2015-12-09 NOTE — Telephone Encounter (Signed)
plz notify sent in. 

## 2015-12-17 ENCOUNTER — Encounter: Payer: Self-pay | Admitting: Family Medicine

## 2015-12-17 ENCOUNTER — Ambulatory Visit (INDEPENDENT_AMBULATORY_CARE_PROVIDER_SITE_OTHER): Payer: Medicare HMO | Admitting: Family Medicine

## 2015-12-17 VITALS — BP 116/62 | HR 72 | Temp 98.1°F | Wt 144.2 lb

## 2015-12-17 DIAGNOSIS — F0280 Dementia in other diseases classified elsewhere without behavioral disturbance: Secondary | ICD-10-CM | POA: Insufficient documentation

## 2015-12-17 DIAGNOSIS — R2689 Other abnormalities of gait and mobility: Secondary | ICD-10-CM

## 2015-12-17 DIAGNOSIS — G3184 Mild cognitive impairment, so stated: Secondary | ICD-10-CM

## 2015-12-17 DIAGNOSIS — Q039 Congenital hydrocephalus, unspecified: Secondary | ICD-10-CM | POA: Diagnosis not present

## 2015-12-17 DIAGNOSIS — G40909 Epilepsy, unspecified, not intractable, without status epilepticus: Secondary | ICD-10-CM | POA: Diagnosis not present

## 2015-12-17 DIAGNOSIS — I456 Pre-excitation syndrome: Secondary | ICD-10-CM

## 2015-12-17 DIAGNOSIS — G309 Alzheimer's disease, unspecified: Secondary | ICD-10-CM

## 2015-12-17 NOTE — Progress Notes (Signed)
BP 116/62 mmHg  Pulse 72  Temp(Src) 98.1 F (36.7 C) (Oral)  Wt 144 lb 4 oz (65.431 kg)   CC: f/u visit  Subjective:    Patient ID: TYARRA NOLTON, female    DOB: [birth year], 70 y.o.   MRN: [id number]  HPI: ORENE ABBASI is a 70 y.o. female presenting on 12/17/2015 for Follow-up and Geriatric Assessment   Husband presents with her today. Several concerns over last several months. More tired, more sleepy, less appetite. More trouble with short term memory since 07/2013 after seizure. She did not keep appt with Dr Karel Jarvis but last 09/2013.   Keppra has prevented further seizures.   Geriatric Assessment: Activities of Daily Living:     Bathing- independent    Dressing- independent    Eating- independent    Toileting- independent    Transferring- independent    Continence- independent  Overall Assessment: independent   Instrumental Activities of Daily Living:     Transportation- dependent    Meal/Food Preparation- partially dependent    Shopping Errands- partially dependent    Housekeeping/Chores- independent    Money Management/Finances- dependent    Medication Management- dependent    Ability to Use Telephone- independent    Laundry- partially dependent Overall Assessment: partially dependent  Mental Status Exam: (value/max value) 27/30   Clock Drawing Score: 3/4   She enjoys playing the piano.  Much more unsteady on her feet, regularly using cane. No falls.  More trouble with hearing endorsed by husband.   Relevant past medical, surgical, family and social history reviewed and updated as indicated. Interim medical history since our last visit reviewed. Allergies and medications reviewed and updated. Current Outpatient Prescriptions on File Prior to Visit  Medication Sig  . atorvastatin (LIPITOR) 40 MG tablet Take 1 tablet (40 mg total) by mouth every evening.  Marland Kitchen DIGOX 125 MCG tablet 0.125 mg daily.   Marland Kitchen levETIRAcetam (KEPPRA) 500 MG tablet Take 1 tablet (500  mg total) by mouth 2 (two) times daily.  Marland Kitchen levothyroxine (SYNTHROID, LEVOTHROID) 25 MCG tablet Take 1 tablet (25 mcg total) by mouth daily before breakfast.  . meclizine (ANTIVERT) 12.5 MG tablet Take 1 tablet (12.5 mg total) by mouth 3 (three) times daily as needed for dizziness.  . metFORMIN (GLUCOPHAGE) 500 MG tablet Take 1 tablet (500 mg total) by mouth daily with breakfast.  . mirabegron ER ([name]) 50 MG TB24 tablet Take 1 tablet (50 mg total) by mouth at bedtime.  . propranolol (INDERAL) 10 MG tablet Take 1 tablet (10 mg total) by mouth 2 (two) times daily.  . [name] 8 MG TB24 tablet TAKE 1 TABLET BY MOUTH EVERY DAY   No current facility-administered medications on file prior to visit.    Review of Systems Per HPI unless specifically indicated in ROS section     Objective:    BP 116/62 mmHg  Pulse 72  Temp(Src) 98.1 F (36.7 C) (Oral)  Wt 144 lb 4 oz (65.431 kg)  Wt Readings from Last 3 Encounters:  12/17/15 144 lb 4 oz (65.431 kg)  09/17/15 142 lb 12 oz (64.751 kg)  08/30/14 149 lb 1.6 oz (67.631 kg)    Physical Exam  Constitutional: She appears well-developed and well-nourished. No distress.  Uses cane to help her walk  HENT:  Right Ear: Tympanic membrane and ear canal normal.  Left Ear: Tympanic membrane and ear canal normal.  Mouth/Throat: Oropharynx is clear and moist. No oropharyngeal exudate.  Eyes:  strabismus  Cardiovascular:  Normal rate, regular rhythm, normal heart sounds and intact distal pulses.   No murmur heard. Pulmonary/Chest: Effort normal and breath sounds normal. No respiratory distress. She has no wheezes. She has no rales.  Musculoskeletal: She exhibits edema (tr).  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 No rash noted.  hirsutism  Psychiatric: She has a normal mood and affect.  Nursing note and vitals reviewed.  Results for orders placed or  performed in visit on 12/17/15  Digoxin level  Result Value Ref Range   Digoxin Level <0.5 (L) 0.8 - 2.0 ug/L      Assessment & Plan:   Problem List Items Addressed This Visit    Seizure disorder (HCC)    Last endorsed seizure 07/2013, since then controlled on keppra. Never f/u with neurology (missed appt to establish with Dr Karel JarvisAquino).      Relevant Orders   Vitamin B12   MR Brain W Wo Contrast   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HCC)    See below. Update MRI.       Relevant Orders   Vitamin B12   MR Brain W Wo Contrast   WPW (Wolff-Parkinson-White syndrome)    Check digoxin level.       Relevant Orders   Digoxin level (Completed)   MCI (mild cognitive impairment) with memory loss - Primary    Predominantly short term memory loss endorsed by husband however MMSE today overall ok 27/30. Discussed no evidence of frank dementia however may very well have mild cognitive impairment. No meds for now.       Relevant Orders   Vitamin B12   MR Brain W Wo Contrast   Imbalance    Deterioration noted by husband, slowly since 07/2013, but may have progressed more over last few months. Will update MRI to eval for worsening ventriculomegaly/hydrocephalus. If stable, will refer to outpatient PT fall prevention/balance training program. If worsening continues, will refer to neurology. Pt and husband agree with plan      Relevant Orders   MR Brain W Wo Contrast       Follow up plan: Return in about 3 months (around 03/18/2016), or as needed, for follow up visit.  Eustaquio BoydenJavier Jaquaveon Bilal, MD

## 2015-12-17 NOTE — Progress Notes (Signed)
Pre visit review using our clinic review tool, if applicable. No additional management support is needed unless otherwise documented below in the visit note. 

## 2015-12-17 NOTE — Patient Instructions (Signed)
We will schedule MRI to further evaluate unsteadiness. Depending on results we may refer you to neurology or physical therapy for fall prevention program. Continue using your cane.  Lab work today. Return as needed or in 3 months for follow up.

## 2015-12-17 NOTE — Assessment & Plan Note (Addendum)
Predominantly short term memory loss endorsed by husband however MMSE today overall ok 27/30. Discussed no evidence of frank dementia however may very well have mild cognitive impairment. No meds for now.

## 2015-12-18 LAB — DIGOXIN LEVEL: Digoxin Level: <0.5 ug/L — ABNORMAL LOW (ref 0.8–2.0)

## 2015-12-18 LAB — VITAMIN B12: Vitamin B-12: 286 pg/mL (ref 211–911)

## 2015-12-18 NOTE — Assessment & Plan Note (Signed)
Deterioration noted by husband, slowly since 07/2013, but may have progressed more over last few months. Will update MRI to eval for worsening ventriculomegaly/hydrocephalus. If stable, will refer to outpatient PT fall prevention/balance training program. If worsening continues, will refer to neurology. Pt and husband agree with plan

## 2015-12-18 NOTE — Assessment & Plan Note (Signed)
See below. Update MRI.

## 2015-12-18 NOTE — Assessment & Plan Note (Signed)
Check digoxin level 

## 2015-12-18 NOTE — Assessment & Plan Note (Signed)
Last endorsed seizure 07/2013, since then controlled on keppra. Never f/u with neurology (missed appt to establish with Dr Karel JarvisAquino).

## 2015-12-24 ENCOUNTER — Telehealth: Payer: Self-pay | Admitting: Family Medicine

## 2015-12-24 ENCOUNTER — Other Ambulatory Visit: Payer: Self-pay | Admitting: Family Medicine

## 2015-12-24 MED ORDER — VITAMIN B-12 1000 MCG PO TABS: 1000.0000 ug | ORAL_TABLET | Freq: Every day | ORAL | Status: DC

## 2015-12-24 NOTE — Telephone Encounter (Signed)
Pt returned waynettas all - please call back at [phone] Thanks

## 2015-12-24 NOTE — Telephone Encounter (Signed)
Spoke with patient.

## 2015-12-28 ENCOUNTER — Other Ambulatory Visit: Payer: Medicare HMO

## 2015-12-30 ENCOUNTER — Telehealth: Payer: Self-pay | Admitting: Family Medicine

## 2015-12-30 NOTE — Telephone Encounter (Signed)
Noted  

## 2015-12-30 NOTE — Telephone Encounter (Signed)
Patient returned Kim's call. I asked patient if she'd be interested in a referral to Neurology. Patient said she can't afford to go to a Neurologist right now.

## 2016-01-04 ENCOUNTER — Inpatient Hospital Stay: Admission: RE | Admit: 2016-01-04 | Payer: Medicare HMO | Source: Ambulatory Visit

## 2016-01-22 ENCOUNTER — Other Ambulatory Visit: Payer: Self-pay | Admitting: *Deleted

## 2016-01-22 MED ORDER — MIRABEGRON ER 50 MG PO TB24: 50.0000 mg | ORAL_TABLET | Freq: Every day | ORAL | Status: DC

## 2016-01-22 MED ORDER — METFORMIN HCL 500 MG PO TABS: 500.0000 mg | ORAL_TABLET | Freq: Every day | ORAL | Status: DC

## 2016-01-22 MED ORDER — MECLIZINE HCL 12.5 MG PO TABS: 12.5000 mg | ORAL_TABLET | Freq: Three times a day (TID) | ORAL | Status: DC | PRN

## 2016-01-22 NOTE — Telephone Encounter (Signed)
Ok to refill to mail order? 

## 2016-01-27 ENCOUNTER — Other Ambulatory Visit: Payer: Self-pay | Admitting: *Deleted

## 2016-01-27 MED ORDER — LEVOTHYROXINE SODIUM 25 MCG PO TABS: 25.0000 ug | ORAL_TABLET | Freq: Every day | ORAL | Status: DC

## 2016-01-27 MED ORDER — FESOTERODINE FUMARATE ER 8 MG PO TB24: 8.0000 mg | ORAL_TABLET | Freq: Every day | ORAL | Status: DC

## 2016-01-27 MED ORDER — ATORVASTATIN CALCIUM 40 MG PO TABS: 40.0000 mg | ORAL_TABLET | Freq: Every evening | ORAL | Status: DC

## 2016-01-27 MED ORDER — LEVETIRACETAM 500 MG PO TABS: 500.0000 mg | ORAL_TABLET | Freq: Two times a day (BID) | ORAL | Status: DC

## 2016-01-27 MED ORDER — PROPRANOLOL HCL 10 MG PO TABS: 10.0000 mg | ORAL_TABLET | Freq: Two times a day (BID) | ORAL | Status: DC

## 2016-03-18 ENCOUNTER — Ambulatory Visit: Payer: Medicare HMO | Admitting: Family Medicine

## 2016-03-18 ENCOUNTER — Telehealth: Payer: Self-pay | Admitting: Family Medicine

## 2016-03-18 DIAGNOSIS — Z0289 Encounter for other administrative examinations: Secondary | ICD-10-CM

## 2016-03-18 NOTE — Telephone Encounter (Signed)
Patient did not come in for their appointment today for 3 mo follow up .  Please let me know if patient needs to be contacted immediately for follow up or no follow up needed. °

## 2016-03-23 ENCOUNTER — Encounter: Payer: Self-pay | Admitting: Family Medicine

## 2016-03-23 ENCOUNTER — Ambulatory Visit (INDEPENDENT_AMBULATORY_CARE_PROVIDER_SITE_OTHER): Payer: Commercial Managed Care - HMO | Admitting: Family Medicine

## 2016-03-23 VITALS — BP 124/86 | HR 64 | Temp 98.1°F | Wt 144.2 lb

## 2016-03-23 DIAGNOSIS — Z23 Encounter for immunization: Secondary | ICD-10-CM | POA: Diagnosis not present

## 2016-03-23 DIAGNOSIS — N3281 Overactive bladder: Secondary | ICD-10-CM

## 2016-03-23 DIAGNOSIS — I456 Pre-excitation syndrome: Secondary | ICD-10-CM

## 2016-03-23 DIAGNOSIS — G40909 Epilepsy, unspecified, not intractable, without status epilepticus: Secondary | ICD-10-CM | POA: Diagnosis not present

## 2016-03-23 DIAGNOSIS — R42 Dizziness and giddiness: Secondary | ICD-10-CM

## 2016-03-23 DIAGNOSIS — Q039 Congenital hydrocephalus, unspecified: Secondary | ICD-10-CM

## 2016-03-23 DIAGNOSIS — I10 Essential (primary) hypertension: Secondary | ICD-10-CM

## 2016-03-23 DIAGNOSIS — E1142 Type 2 diabetes mellitus with diabetic polyneuropathy: Secondary | ICD-10-CM

## 2016-03-23 NOTE — Progress Notes (Signed)
Pre visit review using our clinic review tool, if applicable. No additional management support is needed unless otherwise documented below in the visit note. 

## 2016-03-23 NOTE — Assessment & Plan Note (Signed)
Chronic, stable. Continue current regimen. 

## 2016-03-23 NOTE — Assessment & Plan Note (Addendum)
H/o this, on propanolol and digoxin. Latest level low. Denies recurrent palpitations.

## 2016-03-23 NOTE — Assessment & Plan Note (Addendum)
Chronic, stable. Continue metformin. Foot exam today - poor hygiene. Reviewed importance of regular foot care.

## 2016-03-23 NOTE — Assessment & Plan Note (Signed)
Ongoing - ok to continue meclizine. Encouraged neuro or MRI referral, pt declines at this time.

## 2016-03-23 NOTE — Patient Instructions (Addendum)
Flu shot today Consider scheduling eye doctor appointment if it's been over a year.  We will do second and final pneumonia shot next visit.  Keep an eye on your feet every day to ensure no cuts or scrapes - this is important in diabetes. Keep feet and shoes clean.  Ok to use meclizine as needed for dizziness - if worsening, we may need to check MRI or send you back to neurologist Dr Karel JarvisAquino.  Return in 4-6 months for medicare wellness visit

## 2016-03-23 NOTE — Assessment & Plan Note (Signed)
No recent seizures on keppra - continue. Has declined to establish with neurologist.

## 2016-03-23 NOTE — Assessment & Plan Note (Signed)
Continue Leah Payne, myrbetriq

## 2016-03-23 NOTE — Assessment & Plan Note (Signed)
Dizziness possibly related to worsening hydrocephalus, however patient declines neuro or MRI referral.

## 2016-03-23 NOTE — Progress Notes (Signed)
BP 124/86   Pulse 64   Temp 98.1 F (36.7 C) (Oral)   Wt 144 lb 4 oz (65.4 kg)   BMI 26.38 kg/m    CC: 3 mo f/u visit Subjective:    Patient ID: Leah Payne, female    DOB: 1945/10/15, 70 y.o.   MRN: [id number]  HPI: Leah Payne is a 70 y.o. female presenting on 03/23/2016 for Follow-up   Here alone today (husband waits in waiting room). No concerns today other than chronic dizziness.   See prior note and recent imaging for details. Known congenital hydrocephalus with mild cognitive impairment. Declined neurology referral or MRI for further evaluation of chronic known hydrocephalus (financial concerns). Prior saw Dr Gerlene Fee who did not think patient would benefit from shunt placement. Prior saw Dr Karel Jarvis for recurrent seizure 07/2013 with worsening memory loss, never kept f/u appt.  Much more unsteady on her feet, regularly using cane. No falls.  More trouble with hearing endorsed by husband.   On inderal and lanoxin for h/o tachycardia with WPW.   DM - regularly does not check sugars. Compliant with antihyperglycemic regimen which includes: metformin 500mg  once daily. Denies hypoglycemic symptoms. Denies paresthesias. Last diabetic eye exam 12/2014. Pneumovax: 2016.  Prevnar: declined.  Lab Results  Component Value Date   HGBA1C 6.7 (H) 09/17/2015   Diabetic Foot Exam - Simple   Simple Foot Form Diabetic Foot exam was performed with the following findings:  Yes 03/23/2016 11:12 AM  Visual Inspection See comments:  Yes Sensation Testing See comments:  Yes Pulse Check Posterior Tibialis and Dorsalis pulse intact bilaterally:  Yes Comments Diminished to monofilament testing Caked dirt on feet - cleaned in office today     Relevant past medical, surgical, family and social history reviewed and updated as indicated. Interim medical history since our last visit reviewed. Allergies and medications reviewed and updated. Current Outpatient Prescriptions on File Prior  to Visit  Medication Sig  . atorvastatin (LIPITOR) 40 MG tablet Take 1 tablet (40 mg total) by mouth every evening.  Marland Kitchen DIGOX 125 MCG tablet 0.125 mg daily.   . fesoterodine ([name]) 8 MG TB24 tablet Take 1 tablet (8 mg total) by mouth daily.  Marland Kitchen levETIRAcetam (KEPPRA) 500 MG tablet Take 1 tablet (500 mg total) by mouth 2 (two) times daily.  Marland Kitchen levothyroxine (SYNTHROID, LEVOTHROID) 25 MCG tablet Take 1 tablet (25 mcg total) by mouth daily before breakfast.  . meclizine (ANTIVERT) 12.5 MG tablet Take 1 tablet (12.5 mg total) by mouth 3 (three) times daily as needed for dizziness.  . metFORMIN (GLUCOPHAGE) 500 MG tablet Take 1 tablet (500 mg total) by mouth daily with breakfast.  . mirabegron ER ([name]) 50 MG TB24 tablet Take 1 tablet (50 mg total) by mouth at bedtime.  . propranolol (INDERAL) 10 MG tablet Take 1 tablet (10 mg total) by mouth 2 (two) times daily.  . vitamin B-12 (CYANOCOBALAMIN) 1000 MCG tablet Take 1 tablet (1,000 mcg total) by mouth daily.   No current facility-administered medications on file prior to visit.     Review of Systems Per HPI unless specifically indicated in ROS section     Objective:    BP 124/86   Pulse 64   Temp 98.1 F (36.7 C) (Oral)   Wt 144 lb 4 oz (65.4 kg)   BMI 26.38 kg/m   Wt Readings from Last 3 Encounters:  03/23/16 144 lb 4 oz (65.4 kg)  12/17/15 144 lb 4 oz (65.4 kg)  09/17/15 142 lb 12 oz (64.8 kg)    Physical Exam  Constitutional: She appears well-developed and well-nourished. No distress.  HENT:  Head: Normocephalic and atraumatic.  Mouth/Throat: Oropharynx is clear and moist. No oropharyngeal exudate.  Eyes: Conjunctivae are normal. Pupils are equal, round, and reactive to light. No scleral icterus.  Neck: Normal range of motion. Neck supple.  Cardiovascular: Normal rate, regular rhythm, normal heart sounds and intact distal pulses.   No murmur heard. Pulmonary/Chest: Effort normal and breath sounds normal. No respiratory  distress. She has no wheezes. She has no rales.  Musculoskeletal: She exhibits edema (tr).  See HPI for foot exam if done  Lymphadenopathy:    She has no cervical adenopathy.  Skin: Skin is warm and dry. No rash noted.  Psychiatric: She has a normal mood and affect.  Nursing note and vitals reviewed.  Results for orders placed or performed in visit on 12/17/15  Vitamin B12  Result Value Ref Range   Vitamin B-12 286 211 - 911 pg/mL  Digoxin level  Result Value Ref Range   Digoxin Level <0.5 (L) 0.8 - 2.0 ug/L      Assessment & Plan:   Problem List Items Addressed This Visit    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HCC)    Dizziness possibly related to worsening hydrocephalus, however patient declines neuro or MRI referral.       Dizziness    Ongoing - ok to continue meclizine. Encouraged neuro or MRI referral, pt declines at this time.       Hypertension    Chronic, stable. Continue current regimen.      Overactive bladder    Continue Marianne Sofiatoviaz, myrbetriq      Seizure disorder (HCC)    No recent seizures on keppra - continue. Has declined to establish with neurologist.      Type 2 diabetes, controlled, 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HCC) - Primary    Chronic, stable. Continue metformin. Foot exam today - poor hygiene. Reviewed importance of regular foot care.      WPW (Wolff-Parkinson-White syndrome)    H/o this, on propanolol and digoxin. Latest level low. Denies recurrent palpitations.       Other Visit Diagnoses    Need for influenza vaccination       Relevant Orders   Flu Vaccine QUAD 36+ mos PF IM (Fluarix & Fluzone Quad PF) (Completed)       Follow up plan: Return in about 6 months (around 09/21/2016), or as needed, for medicare wellness visit.  Eustaquio BoydenJavier Ysabel Cowgill, MD

## 2016-04-29 ENCOUNTER — Other Ambulatory Visit: Payer: Self-pay | Admitting: Family Medicine

## 2016-07-21 ENCOUNTER — Other Ambulatory Visit: Payer: Self-pay | Admitting: Family Medicine

## 2016-08-15 ENCOUNTER — Encounter (HOSPITAL_COMMUNITY): Payer: Self-pay

## 2016-08-15 ENCOUNTER — Emergency Department (HOSPITAL_COMMUNITY): Payer: Medicare HMO

## 2016-08-15 ENCOUNTER — Emergency Department (HOSPITAL_COMMUNITY)
Admission: EM | Admit: 2016-08-15 | Discharge: 2016-08-16 | Disposition: A | Discharge status: Home / Self Care | Payer: Medicare HMO | Attending: Emergency Medicine | Admitting: Emergency Medicine

## 2016-08-15 DIAGNOSIS — I10 Essential (primary) hypertension: Secondary | ICD-10-CM | POA: Diagnosis not present

## 2016-08-15 DIAGNOSIS — R05 Cough: Secondary | ICD-10-CM | POA: Diagnosis not present

## 2016-08-15 DIAGNOSIS — R930 Abnormal findings on diagnostic imaging of skull and head, not elsewhere classified: Secondary | ICD-10-CM | POA: Insufficient documentation

## 2016-08-15 DIAGNOSIS — Z79899 Other long term (current) drug therapy: Secondary | ICD-10-CM | POA: Insufficient documentation

## 2016-08-15 DIAGNOSIS — E119 Type 2 diabetes mellitus without complications: Secondary | ICD-10-CM | POA: Insufficient documentation

## 2016-08-15 DIAGNOSIS — R531 Weakness: Secondary | ICD-10-CM

## 2016-08-15 DIAGNOSIS — Z7984 Long term (current) use of oral hypoglycemic drugs: Secondary | ICD-10-CM | POA: Diagnosis not present

## 2016-08-15 LAB — BASIC METABOLIC PANEL
Anion gap: 10 (ref 5–15)
BUN: 7 mg/dL (ref 6–20)
CO2: 26 mmol/L (ref 22–32)
Calcium: 9.1 mg/dL (ref 8.9–10.3)
Chloride: 100 mmol/L — ABNORMAL LOW (ref 101–111)
Creatinine, Ser: 0.61 mg/dL (ref 0.44–1.00)
GFR calc Af Amer: >60 mL/min (ref >60)
GFR calc non Af Amer: >60 mL/min (ref >60)
Glucose, Bld: 156 mg/dL — ABNORMAL HIGH (ref 65–99)
Potassium: 3.6 mmol/L (ref 3.5–5.1)
Sodium: 136 mmol/L (ref 135–145)

## 2016-08-15 LAB — CBC
HCT: 37.7 % (ref 36.0–46.0)
Hemoglobin: 12.6 g/dL (ref 12.0–15.0)
MCH: 28.7 pg (ref 26.0–34.0)
MCHC: 33.4 g/dL (ref 30.0–36.0)
MCV: 85.9 fL (ref 78.0–100.0)
Platelets: 268 10*3/uL (ref 150–400)
RBC: 4.39 MIL/uL (ref 3.87–5.11)
RDW: 12.3 % (ref 11.5–15.5)
WBC: 6 10*3/uL (ref 4.0–10.5)

## 2016-08-15 NOTE — ED Triage Notes (Signed)
Pt reports chills and body aches for approximately 1 week. Pt denies chest pain and shortness of breath.

## 2016-08-15 NOTE — ED Provider Notes (Signed)
[facility]EMERGENCY DEPT Provider Note   CSN: [id number] Arrival date & time: 08/15/16  1621     History   Chief Complaint Chief Complaint  Patient presents with  . Chills  . Generalized Body Aches    HPI Leah Payne is a 71 y.o. female.  71 year old Caucasian female past medical history significant for hydrocephalus, hyponatremia, seizure disorder, diabetes that presents to the ED today with complaint of generalized body aches, generalized weakness, confusion for the past week. Husband at bedside states the patient has been feeling generally weak for the past week and has had poor by mouth intake. States that patient felt significantly worse today and decided to come to the ED. Assessment states that patient had "2 stroke things" in 2015. States that she has been more confused since then. Husband also reports that patient has been very forgetful for the past week and poor short-term memory. Patient denies any fever, chills, headache, vision changes, lightheadedness, dizziness, chest pain, shortness of breath, cough, URI symptoms, abdominal pain, nausea, emesis, urinary symptoms, change in bowel habits, numbness/tingling. Denies any sick contacts.      Past Medical History:  Diagnosis Date  . Anomaly of cranium    frontal bossing, enlarged cranium, adult strabismus  . Diabetes mellitus type 2, controlled (HCC) 08/04/2013  . History of chicken pox   . History of fainting   . 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 Hyperlipidemia 08/04/2013  . Hyponatremia 08/29/2014  . Preaxial polydactyly of right hand congenital  . Seizure disorder (HCC) 08/04/2013  . Tachycardia   . WPW (Wolff-Parkinson-White syndrome)    has seen Dr Donnie Ahoilley    Patient Active Problem List   Diagnosis Date Noted  . Dizziness 03/23/2016  .  MCI (mild cognitive impairment) with memory loss 12/17/2015  . Imbalance 12/17/2015  . WPW (Wolff-Parkinson-White syndrome)   . Type 2 diabetes, controlled, 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HCC) 09/17/2015  . Thyroid activity decreased 09/17/2015  . Overactive bladder 08/29/2014  . Seizure disorder (HCC) 08/04/2013  . 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HCC) 08/04/2013  . Hypertension 08/04/2013  . Hyperlipidemia 08/04/2013    Past Surgical History:  Procedure Laterality Date  . CHOLECYSTECTOMY  1978    OB History    No data available       Home Medications    Prior to Admission medications   Medication Sig Start Date End Date Taking? Authorizing Provider  DIGOX 125 MCG tablet Take 0.125 mg by mouth daily.  07/20/13  Yes Historical Provider, MD  ibuprofen (ADVIL,MOTRIN) 200 MG tablet Take 400 mg by mouth at bedtime.   Yes Historical Provider, MD  levETIRAcetam (KEPPRA) 500 MG tablet Take 1 tablet (500 mg total) by mouth 2 (two) times daily. 01/27/16  Yes Eustaquio BoydenJavier Gutierrez, MD  levothyroxine (SYNTHROID, LEVOTHROID) 25 MCG tablet TAKE 1 TABLET BY MOUTH DAILY BEFORE BREAKFAST. 07/21/16  Yes Eustaquio BoydenJavier Gutierrez, MD  meclizine (ANTIVERT) 12.5 MG tablet Take 1 tablet (12.5 mg total) by mouth 3 (three) times daily as needed for dizziness. 01/22/16  Yes Eustaquio BoydenJavier Gutierrez, MD  metFORMIN (GLUCOPHAGE) 500 MG tablet Take 1 tablet (500 mg total) by mouth daily with breakfast. 01/22/16  Yes Eustaquio BoydenJavier Gutierrez, MD  mirabegron ER ([name]) 50 MG TB24 tablet Take 1 tablet (50 mg total) by mouth at bedtime. 01/22/16  Yes Eustaquio BoydenJavier Gutierrez, MD  propranolol (INDERAL) 10 MG tablet  Take 1 tablet (10 mg total) by mouth 2 (two) times daily. 01/27/16  Yes Eustaquio Boyden, MD  [name] 8 MG TB24 tablet TAKE 1 TABLET EVERY DAY 04/29/16  Yes Eustaquio Boyden, MD    Family History Family History  Problem Relation Age of Onset  . Stroke Mother 5  . CAD Father 51  . Alzheimer's disease Father   . Cancer Maternal Aunt     lung  (non-smoker)  . Diabetes Neg Hx     Social History Social History  Substance Use Topics  . Smoking status: Never Smoker  . Smokeless tobacco: Never Used  . Alcohol use No     Allergies   Patient has no known allergies.   Review of Systems Review of Systems  Constitutional: Positive for appetite change. Negative for chills, fever and unexpected weight change.  HENT: Negative for congestion, rhinorrhea, sinus pain, sinus pressure and sore throat.   Eyes: Negative for visual disturbance.  Respiratory: Negative for cough and shortness of breath.   Cardiovascular: Negative for chest pain and palpitations.  Gastrointestinal: Negative for abdominal pain, diarrhea, nausea and vomiting.  Genitourinary: Negative for dysuria, flank pain, frequency, hematuria and urgency.  Neurological: Negative for dizziness, syncope, weakness, light-headedness, numbness and headaches.  All other systems reviewed and are negative.    Physical Exam Updated Vital Signs BP 139/94 (BP Location: Right Arm)   Pulse 79   Temp 98.3 F (36.8 C) (Oral)   Resp 18   SpO2 98%   Physical Exam  Constitutional: She is oriented to person, place, and time. She appears well-developed and well-nourished. No distress.  HENT:  Head: Normocephalic and atraumatic.  Mouth/Throat: Oropharynx is clear and moist.  Eyes: Conjunctivae and EOM are normal. Pupils are equal, round, and reactive to light. Right eye exhibits no discharge. Left eye exhibits no discharge. No scleral icterus.  Strabismus noted.  Neck: Normal range of motion. Neck supple. No thyromegaly present.  No nuchal rigidity   Cardiovascular: Normal rate, regular rhythm, normal heart sounds and intact distal pulses.  Exam reveals no gallop and no friction rub.   No murmur heard. Pulmonary/Chest: Effort normal and breath sounds normal. No respiratory distress. She has no wheezes. She exhibits no tenderness.  Abdominal: Soft. Bowel sounds are normal. She  exhibits no distension. There is no tenderness. There is no rigidity, no rebound, no guarding and no CVA tenderness.  Musculoskeletal: Normal range of motion.  Deformities of bilateral feet as well as L thumb with polydactyly   Lymphadenopathy:    She has no cervical adenopathy.  Neurological: She is alert and oriented to person, place, and time.  The patient is alert, attentive, and oriented x 3. Speech is clear. Cranial nerve II-VII grossly intact. Negative pronator drift. Sensation intact. Strength 5/5 in all extremities. Reflexes 2+ and symmetric at biceps, triceps, knees, and ankles. Rapid alternating movement and fine finger movements intact. Romberg is absent. Posture and gait normal.   Skin: Skin is warm and dry.  hirtutism   Nursing note and vitals reviewed.    ED Treatments / Results  Labs (all labs ordered are listed, but only abnormal results are displayed) Labs Reviewed  BASIC METABOLIC PANEL - Abnormal; Notable for the following:       Result Value   Chloride 100 (*)    Glucose, Bld 156 (*)    All other components within normal limits  URINALYSIS, ROUTINE W REFLEX MICROSCOPIC - Abnormal; Notable for the following:    Leukocytes, UA  TRACE (*)    Squamous Epithelial / LPF 0-5 (*)    All other components within normal limits  CBC    EKG  EKG Interpretation  Date/Time:  Monday August 15 2016 23:50:51 EST Ventricular Rate:  93 PR Interval:    QRS Duration: 73 QT Interval:  360 QTC Calculation: 448 R Axis:   13 Text Interpretation:  Sinus rhythm Nonspecific repolarization abnormalities Confirmed by Wilkie Aye  MD, COURTNEY ([id number]) on 08/15/2016 11:55:05 PM       Radiology Dg Chest 2 View  Result Date: 08/15/2016 CLINICAL DATA:  Cough, weakness for 2 weeks.  History of diabetes. EXAM: CHEST  2 VIEW COMPARISON:  Chest radiograph August 29, 2014 FINDINGS: Cardiomediastinal silhouette is normal. No pleural effusions or focal consolidations. Trachea projects midline  and there is no pneumothorax. Soft tissue planes and included osseous structures are non-suspicious. Mild degenerative change of thoracic spine. Surgical clips in the included right abdomen compatible with cholecystectomy. IMPRESSION: No acute cardiopulmonary process ; improved examination. Electronically Signed   By: Awilda Metro M.D.   On: 08/15/2016 17:53   Ct Head Wo Contrast  Result Date: 08/16/2016 CLINICAL DATA:  Chills and generalized weakness x1 week. History of seizure, hydrocephalus and fainting EXAM: CT HEAD WITHOUT CONTRAST TECHNIQUE: Contiguous axial images were obtained from the base of the skull through the vertex without intravenous contrast. COMPARISON:  MRI 08/06/2013, CT 08/04/2013 FINDINGS: Brain: Marked chronic ventriculomegaly with prominent lateral and third ventricles and chronic compression of cerebral tissue along its periphery. Fourth ventricle is normal in size. Findings consistent with aqueductal stenosis. No acute intracranial hemorrhage, extra-axial fluid no midline shift. No acute large vascular territory infarction. Vascular: Moderate carotid siphon calcifications. Skull: Nonacute Sinuses/Orbits: Nonacute Other: None IMPRESSION: No acute intracranial abnormality. Chronic marked dilatation of the lateral third ventricles with normal fourth ventricle in keeping with aqueductal stenosis. Electronically Signed   By: Tollie Eth M.D.   On: 08/16/2016 00:50    Procedures Procedures (including critical care time)  Medications Ordered in ED Medications - No data to display   Initial Impression / Assessment and Plan / ED Course  I have reviewed the triage vital signs and the nursing notes.  Pertinent labs & imaging results that were available during my care of the patient were reviewed by me and considered in my medical decision making (see chart for details).     Patient presents to the ED with complaints of generalized body aches, weakness, poor appetite for the  past 2 weeks. Patient brought in by her husband. Patient is nontoxic appearing. She is oriented 3. All labs were unremarkable. Urine without any signs of infection. Chest x-ray normal. No focal neuro deficits. Patient states recent memory loss Will obtain head CT. Head CT without any acute abnormalities. Given patient's normal neurological exam and CT findings low suspicion for strokelike symptoms at this time. Chronic changes are noted. She has been hemodynamically stable in NAD. Vital signs have been stable. Patient has no complaints at this time. EKG without any acute changes. Patient without any URI symptoms or fever. Low suspicion for influenza. Electrolytes are normal. No signs of dehydration. Patient appears stable for discharge at this time. Encouraged close follow-up with her primary care physician. Given patient's poor by mouth intake and may need appetite stimulant which can be worked up by her primary care physician. Pt is hemodynamically stable, in NAD, & able to ambulate in the ED. Pthas no complaints prior to dc. Pt is comfortable with above  plan and is stable for discharge at this time. All questions were answered prior to disposition. Strict return precautions for f/u to the ED were discussed. Pt was dicussed and seen by Dr. Wilkie Aye who is agreeable to the above plan    Final Clinical Impressions(s) / ED Diagnoses   Final diagnoses:  Generalized weakness    New Prescriptions New Prescriptions   No medications on file     Rise Mu, PA-C 08/16/16 0222    Shon Baton, MD 08/20/16 1304

## 2016-08-15 NOTE — ED Notes (Addendum)
Patient's husband at [facility] asking to be called if patient gets a bed.  Pt's husband's number [phone].  His name is Zollie BeckersWalter.  Primary Rn made aware.

## 2016-08-16 ENCOUNTER — Emergency Department (HOSPITAL_COMMUNITY): Payer: Medicare HMO

## 2016-08-16 DIAGNOSIS — R531 Weakness: Secondary | ICD-10-CM | POA: Diagnosis not present

## 2016-08-16 LAB — URINALYSIS, ROUTINE W REFLEX MICROSCOPIC
Bacteria, UA: NONE SEEN
Bilirubin Urine: NEGATIVE
Glucose, UA: NEGATIVE mg/dL
Hgb urine dipstick: NEGATIVE
Ketones, ur: NEGATIVE mg/dL
Nitrite: NEGATIVE
Protein, ur: NEGATIVE mg/dL
Specific Gravity, Urine: 1.008 (ref 1.005–1.030)
pH: 6 (ref 5.0–8.0)

## 2016-08-16 NOTE — Discharge Instructions (Signed)
All of her labs and imaging has been normal today. Her vital signs are stable. She will need to have close follow-up with her primary care doctor. Return to the ED if she develops any worsening symptoms.

## 2016-08-19 ENCOUNTER — Other Ambulatory Visit: Payer: Self-pay | Admitting: Family Medicine

## 2016-08-19 MED ORDER — MECLIZINE HCL 12.5 MG PO TABS: 12.5000 mg | ORAL_TABLET | Freq: Three times a day (TID) | ORAL | 1 refills | Status: DC | PRN

## 2016-08-19 MED ORDER — LEVETIRACETAM 500 MG PO TABS: 500.0000 mg | ORAL_TABLET | Freq: Two times a day (BID) | ORAL | 1 refills | Status: DC

## 2016-08-19 MED ORDER — ATORVASTATIN CALCIUM 40 MG PO TABS: 40.0000 mg | ORAL_TABLET | Freq: Every evening | ORAL | 1 refills | Status: DC

## 2016-08-19 NOTE — Telephone Encounter (Signed)
Spouse came by to get refills on the following meds for Leah Payne   Spouse stated she has enough for 5 more days  levetiracetam 500mg /tab Take 1 tablet twice daily   Meclizine 12.5mg  tab Take 1 tablet 3 times daily as needed for dizziness  atorvastain 40mg  tab Take 1 tablet every evening  [facility] mail in

## 2016-08-19 NOTE — Telephone Encounter (Signed)
This message was sent to me and I did not see Mr Wyline MoodOrrell. Pt last seen 03/23/16; at that visit was note in chart the if dizziness worsens may need MRI or f/u with Dr Karel JarvisAquino. The atorvastatin on hx med list with note pt not taken in last 30 days. And levetiracetam last filled # 180 x 1 on 01/27/16.Please advise. Pt has CPX scheduled 09/23/16.

## 2016-08-25 ENCOUNTER — Ambulatory Visit (INDEPENDENT_AMBULATORY_CARE_PROVIDER_SITE_OTHER): Payer: Medicare HMO | Admitting: Family Medicine

## 2016-08-25 ENCOUNTER — Encounter: Payer: Self-pay | Admitting: Family Medicine

## 2016-08-25 VITALS — BP 148/96 | HR 77 | Temp 98.2°F | Wt 148.0 lb

## 2016-08-25 DIAGNOSIS — E1142 Type 2 diabetes mellitus with diabetic polyneuropathy: Secondary | ICD-10-CM

## 2016-08-25 DIAGNOSIS — I456 Pre-excitation syndrome: Secondary | ICD-10-CM

## 2016-08-25 DIAGNOSIS — Q039 Congenital hydrocephalus, unspecified: Secondary | ICD-10-CM

## 2016-08-25 DIAGNOSIS — E785 Hyperlipidemia, unspecified: Secondary | ICD-10-CM

## 2016-08-25 DIAGNOSIS — E039 Hypothyroidism, unspecified: Secondary | ICD-10-CM | POA: Diagnosis not present

## 2016-08-25 DIAGNOSIS — Z23 Encounter for immunization: Secondary | ICD-10-CM | POA: Diagnosis not present

## 2016-08-25 DIAGNOSIS — N3281 Overactive bladder: Secondary | ICD-10-CM

## 2016-08-25 DIAGNOSIS — G3184 Mild cognitive impairment, so stated: Secondary | ICD-10-CM

## 2016-08-25 DIAGNOSIS — Z1159 Encounter for screening for other viral diseases: Secondary | ICD-10-CM | POA: Diagnosis not present

## 2016-08-25 DIAGNOSIS — R7989 Other specified abnormal findings of blood chemistry: Secondary | ICD-10-CM

## 2016-08-25 DIAGNOSIS — G40909 Epilepsy, unspecified, not intractable, without status epilepticus: Secondary | ICD-10-CM

## 2016-08-25 DIAGNOSIS — I10 Essential (primary) hypertension: Secondary | ICD-10-CM

## 2016-08-25 DIAGNOSIS — R42 Dizziness and giddiness: Secondary | ICD-10-CM

## 2016-08-25 DIAGNOSIS — E538 Deficiency of other specified B group vitamins: Secondary | ICD-10-CM

## 2016-08-25 LAB — HEMOGLOBIN A1C: Hgb A1c MFr Bld: 6.2 % (ref 4.6–6.5)

## 2016-08-25 LAB — TSH: TSH: 2.23 u[IU]/mL (ref 0.35–4.50)

## 2016-08-25 LAB — LIPID PANEL
Cholesterol: 175 mg/dL (ref 0–200)
HDL: 53.6 mg/dL (ref >39.00)
LDL Cholesterol: 103 mg/dL — ABNORMAL HIGH (ref 0–99)
NonHDL: 121.08
Total CHOL/HDL Ratio: 3
Triglycerides: 91 mg/dL (ref 0.0–149.0)
VLDL: 18.2 mg/dL (ref 0.0–40.0)

## 2016-08-25 LAB — VITAMIN B12: Vitamin B-12: 287 pg/mL (ref 211–911)

## 2016-08-25 MED ORDER — PROPRANOLOL HCL 10 MG PO TABS: 10.0000 mg | ORAL_TABLET | Freq: Two times a day (BID) | ORAL | 1 refills | Status: DC

## 2016-08-25 MED ORDER — SERTRALINE HCL 25 MG PO TABS: 25.0000 mg | ORAL_TABLET | Freq: Every day | ORAL | 3 refills | Status: DC

## 2016-08-25 NOTE — Assessment & Plan Note (Addendum)
No recent seizures on keppra. Continue.  Declined neurology referral.

## 2016-08-25 NOTE — Addendum Note (Signed)
Addended by: Eual FinesBRIDGES, Partick Musselman P on: 08/25/2016 10:54 AM   Modules accepted: Orders

## 2016-08-25 NOTE — Progress Notes (Signed)
Pre visit review using our clinic review tool, if applicable. No additional management support is needed unless otherwise documented below in the visit note. 

## 2016-08-25 NOTE — Patient Instructions (Addendum)
Schedule eye exam as you're due. prevnar today.  Labs today.  Good to see you today, call us with questions. Start sertraline 25mg  once daily (mood medicine). Try this for at least the next month.  Return to see me in 1-2 months for follow up visit.

## 2016-08-25 NOTE — Assessment & Plan Note (Signed)
Significant improvement on current regimen of toviaz and myrbetriq

## 2016-08-25 NOTE — Assessment & Plan Note (Signed)
Chronic, stable. Continue atorvastatin. Update FLP.

## 2016-08-25 NOTE — Addendum Note (Signed)
Addended by: Alvina ChouWALSH, Stpehanie Montroy J on: 08/25/2016 11:51 AM   Modules accepted: Orders

## 2016-08-25 NOTE — Assessment & Plan Note (Signed)
Chronic. Update A1c.  

## 2016-08-25 NOTE — Assessment & Plan Note (Signed)
Controlled with PRN meclizine. [facility]. Offered local pharmacy - they will let me know if desired. Anticipate hydrocephalus related.

## 2016-08-25 NOTE — Assessment & Plan Note (Signed)
Predominantly short term memory loss endorsed by husband however MMSE reassuring. ?psuedodementia. Will trial low dose sertraline. Husband agrees, pt agrees. RTC 1-2 mo f/u visit.

## 2016-08-25 NOTE — Assessment & Plan Note (Signed)
Stable on recent CT.

## 2016-08-25 NOTE — Progress Notes (Signed)
BP (!) 148/96 (BP Location: Left Arm, Patient Position: Sitting, Cuff Size: Normal)   Pulse 77   Temp 98.2 F (36.8 C) (Oral)   Wt 148 lb (67.1 kg)   SpO2 96%   BMI 27.07 kg/m    CC: ER f/u visit Subjective:    Patient ID: Leah Payne, female    DOB: 07/04/1946, 71 y.o.   MRN: [id number]  HPI: Leah Payne is a 71 y.o. female presenting on 08/25/2016 for ER Follow-up Micah Flesher to ER 08-15-16. Weakness and dizziness. Ran multiple tests. Husband says she has felt bad for a month)   Known congenital hydrocephalus with mild cognitive impairment. Declined neurology referral or MRI for further evaluation of chronic known hydrocephalus (financial concerns). Prior saw Dr Gerlene Fee who did not think patient would benefit from shunt placement. Prior saw Dr Karel Jarvis for recurrent seizure 07/2013 with worsening memory loss, never kept f/u appt. Last seen 02/2016.   Recent ER visit for generalized weakness and poor PO intake. Had reassuringly normal workup. She had been ill for 2 weeks, forearm aches treated with advil 400mg  nightly. No appetite. Head CT showed stable hydrocephalus.   + dizziness treated with meclizine described as lightheadedness. No vertigo, presyncope or syncope.   Husband concerned about noted progressive decline in patient. Increased fatigue.  Much more unsteady on her feet, regularly using cane. No falls or near falls. No recent fevers/chills, chest pain, abd pain, dyspnea, coughing, nausea/vomiting, diarrhea or constipation.   Off digoxin over the past year.   DM - regularly does not check sugars. Compliant with antihyperglycemic regimen which includes: metformin 500mg  once daily. Denies hypoglycemic symptoms. Denies paresthesias. Last diabetic eye exam DUE.  Pneumovax: 2016.  Prevnar: today. Lab Results  Component Value Date   HGBA1C 6.7 (H) 09/17/2015   Diabetic Foot Exam - Simple   No data filed       Geriatric Assessment: Activities of Daily Living:   Bathing- independent    Dressing- independent    Eating- independent    Toileting- independent    Transferring- independent    Continence- independent Overall Assessment: independent  Instrumental Activities of Daily Living:     Transportation- dependent (has never driven)    Meal/Food Preparation- dependent    Shopping Errands- dependent    Housekeeping/Chores- independent    Money Management/Finances- dependent    Medication Management- dependent    Ability to Use Telephone- independent    Laundry- dependent (no washing machine) Overall Assessment: largely dependent  Mental Status Exam: 28/30 (value/max value)     Clock Drawing Score: 2/4  Relevant past medical, surgical, family and social history reviewed and updated as indicated. Interim medical history since our last visit reviewed. Allergies and medications reviewed and updated. Current Outpatient Prescriptions on File Prior to Visit  Medication Sig  . atorvastatin (LIPITOR) 40 MG tablet Take 1 tablet (40 mg total) by mouth every evening.  Marland Kitchen ibuprofen (ADVIL,MOTRIN) 200 MG tablet Take 400 mg by mouth at bedtime.  . levETIRAcetam (KEPPRA) 500 MG tablet Take 1 tablet (500 mg total) by mouth 2 (two) times daily.  Marland Kitchen levothyroxine (SYNTHROID, LEVOTHROID) 25 MCG tablet TAKE 1 TABLET BY MOUTH DAILY BEFORE BREAKFAST.  Marland Kitchen meclizine (ANTIVERT) 12.5 MG tablet Take 1 tablet (12.5 mg total) by mouth 3 (three) times daily as needed for dizziness.  . metFORMIN (GLUCOPHAGE) 500 MG tablet Take 1 tablet (500 mg total) by mouth daily with breakfast.  . mirabegron ER ([name]) 50 MG TB24 tablet Take 1  tablet (50 mg total) by mouth at bedtime.  . [name] 8 MG TB24 tablet TAKE 1 TABLET EVERY DAY   No current facility-administered medications on file prior to visit.     Review of Systems Per HPI unless specifically indicated in ROS section     Objective:    BP (!) 148/96 (BP Location: Left Arm, Patient Position: Sitting, Cuff Size:  Normal)   Pulse 77   Temp 98.2 F (36.8 C) (Oral)   Wt 148 lb (67.1 kg)   SpO2 96%   BMI 27.07 kg/m   Wt Readings from Last 3 Encounters:  08/25/16 148 lb (67.1 kg)  03/23/16 144 lb 4 oz (65.4 kg)  12/17/15 144 lb 4 oz (65.4 kg)    Physical Exam  Constitutional: She appears well-developed and well-nourished. No distress.  HENT:  Head: Normocephalic and atraumatic.  Mouth/Throat: Oropharynx is clear and moist. No oropharyngeal exudate.  Cardiovascular: Normal rate, regular rhythm, normal heart sounds and intact distal pulses.   No murmur heard. Pulmonary/Chest: Effort normal and breath sounds normal. No respiratory distress. She has no wheezes. She has no rales.  Musculoskeletal: She exhibits no edema.  polydactyly  Skin: Skin is warm and dry. No rash noted.  Psychiatric: She has a normal mood and affect.  Nursing note and vitals reviewed.  Results for orders placed or performed during the hospital encounter of 08/15/16  CBC  Result Value Ref Range   WBC 6.0 4.0 - 10.5 K/uL   RBC 4.39 3.87 - 5.11 MIL/uL   Hemoglobin 12.6 12.0 - 15.0 g/dL   HCT 29.537.7 62.136.0 - 30.846.0 %   MCV 85.9 78.0 - 100.0 fL   MCH 28.7 26.0 - 34.0 pg   MCHC 33.4 30.0 - 36.0 g/dL   RDW 65.712.3 84.611.5 - 96.215.5 %   Platelets 268 150 - 400 K/uL  Basic metabolic panel  Result Value Ref Range   Sodium 136 135 - 145 mmol/L   Potassium 3.6 3.5 - 5.1 mmol/L   Chloride 100 (L) 101 - 111 mmol/L   CO2 26 22 - 32 mmol/L   Glucose, Bld 156 (H) 65 - 99 mg/dL   BUN 7 6 - 20 mg/dL   Creatinine, Ser 9.520.61 0.44 - 1.00 mg/dL   Calcium 9.1 8.9 - 84.110.3 mg/dL   GFR calc non Af Amer >60 >60 mL/min   GFR calc Af Amer >60 >60 mL/min   Anion gap 10 5 - 15  Urinalysis, Routine w reflex microscopic  Result Value Ref Range   Color, Urine YELLOW YELLOW   APPearance CLEAR CLEAR   Specific Gravity, Urine 1.008 1.005 - 1.030   pH 6.0 5.0 - 8.0   Glucose, UA NEGATIVE NEGATIVE mg/dL   Hgb urine dipstick NEGATIVE NEGATIVE   Bilirubin Urine  NEGATIVE NEGATIVE   Ketones, ur NEGATIVE NEGATIVE mg/dL   Protein, ur NEGATIVE NEGATIVE mg/dL   Nitrite NEGATIVE NEGATIVE   Leukocytes, UA TRACE (A) NEGATIVE   RBC / HPF 0-5 0 - 5 RBC/hpf   WBC, UA 0-5 0 - 5 WBC/hpf   Bacteria, UA NONE SEEN NONE SEEN   Squamous Epithelial / LPF 0-5 (A) NONE SEEN      Assessment & Plan:   Problem List Items Addressed This Visit    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HCC)    Stable on recent CT.       Dizziness    Controlled with PRN meclizine. [facility] Offered local pharmacy - they will  let me know if desired. Anticipate hydrocephalus related.       Hyperlipidemia    Chronic, stable. Continue atorvastatin. Update FLP.       Relevant Medications   propranolol (INDERAL) 10 MG tablet   Other Relevant Orders   Lipid panel   Hypertension    Chronic. Mildly elevated. No changes indicated.       Relevant Medications   propranolol (INDERAL) 10 MG tablet   MCI (mild cognitive impairment) with memory loss - Primary    Predominantly short term memory loss endorsed by husband however MMSE reassuring. ?psuedodementia. Will trial low dose sertraline. Husband agrees, pt agrees. RTC 1-2 mo f/u visit.       Overactive bladder    Significant improvement on current regimen of toviaz and myrbetriq      Seizure disorder (HCC)    No recent seizures on keppra. Continue.  Declined neurology referral.       Thyroid activity decreased    Update TSH. Continue low dose levothyroxine.       Relevant Medications   propranolol (INDERAL) 10 MG tablet   Other Relevant Orders   TSH   Type 2 diabetes, controlled, 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HCC)    Chronic. Update A1c.       Relevant Medications   sertraline (ZOLOFT) 25 MG tablet   Other Relevant Orders   Hemoglobin A1c   Microalbumin / creatinine urine ratio   WPW (Wolff-Parkinson-White syndrome)    Stable period on propranolol. States off digoxin x years.       Relevant  Medications   propranolol (INDERAL) 10 MG tablet    Other Visit Diagnoses    Low serum vitamin B12       Relevant Orders   Vitamin B12   Need for hepatitis C screening test       Relevant Orders   Hepatitis C antibody       Follow up plan: Return in about 2 months (around 10/23/2016) for follow up visit.  Eustaquio Boyden, MD

## 2016-08-25 NOTE — Assessment & Plan Note (Signed)
Stable period on propranolol. States off digoxin x years.

## 2016-08-25 NOTE — Assessment & Plan Note (Signed)
Update TSH. Continue low dose levothyroxine.

## 2016-08-25 NOTE — Assessment & Plan Note (Signed)
Chronic. Mildly elevated. No changes indicated.

## 2016-08-26 LAB — HEPATITIS C ANTIBODY: HCV Ab: NEGATIVE

## 2016-08-28 ENCOUNTER — Other Ambulatory Visit: Payer: Self-pay | Admitting: Family Medicine

## 2016-08-28 MED ORDER — CYANOCOBALAMIN 500 MCG PO TABS: 500.0000 ug | ORAL_TABLET | Freq: Every day | ORAL | Status: DC

## 2016-09-02 ENCOUNTER — Telehealth: Payer: Self-pay | Admitting: *Deleted

## 2016-09-02 NOTE — Telephone Encounter (Signed)
Meclizine required PA. Completed and denied via Cover my meds.

## 2016-09-07 NOTE — Telephone Encounter (Signed)
PA denied since Meclizine can be purchased OTC.

## 2016-09-23 ENCOUNTER — Ambulatory Visit (INDEPENDENT_AMBULATORY_CARE_PROVIDER_SITE_OTHER): Payer: Medicare HMO | Admitting: Family Medicine

## 2016-09-23 ENCOUNTER — Encounter: Payer: Self-pay | Admitting: Family Medicine

## 2016-09-23 VITALS — BP 138/90 | HR 72 | Temp 98.1°F | Ht 61.0 in | Wt 148.5 lb

## 2016-09-23 DIAGNOSIS — G40909 Epilepsy, unspecified, not intractable, without status epilepticus: Secondary | ICD-10-CM

## 2016-09-23 DIAGNOSIS — Z7189 Other specified counseling: Secondary | ICD-10-CM

## 2016-09-23 DIAGNOSIS — E039 Hypothyroidism, unspecified: Secondary | ICD-10-CM

## 2016-09-23 DIAGNOSIS — Z1239 Encounter for other screening for malignant neoplasm of breast: Secondary | ICD-10-CM

## 2016-09-23 DIAGNOSIS — I10 Essential (primary) hypertension: Secondary | ICD-10-CM

## 2016-09-23 DIAGNOSIS — R42 Dizziness and giddiness: Secondary | ICD-10-CM

## 2016-09-23 DIAGNOSIS — G3184 Mild cognitive impairment, so stated: Secondary | ICD-10-CM

## 2016-09-23 DIAGNOSIS — Z1211 Encounter for screening for malignant neoplasm of colon: Secondary | ICD-10-CM

## 2016-09-23 DIAGNOSIS — E1142 Type 2 diabetes mellitus with diabetic polyneuropathy: Secondary | ICD-10-CM

## 2016-09-23 DIAGNOSIS — R2689 Other abnormalities of gait and mobility: Secondary | ICD-10-CM

## 2016-09-23 DIAGNOSIS — E2839 Other primary ovarian failure: Secondary | ICD-10-CM

## 2016-09-23 DIAGNOSIS — Z0000 Encounter for general adult medical examination without abnormal findings: Secondary | ICD-10-CM | POA: Diagnosis not present

## 2016-09-23 DIAGNOSIS — N3281 Overactive bladder: Secondary | ICD-10-CM

## 2016-09-23 DIAGNOSIS — E785 Hyperlipidemia, unspecified: Secondary | ICD-10-CM

## 2016-09-23 DIAGNOSIS — Q039 Congenital hydrocephalus, unspecified: Secondary | ICD-10-CM

## 2016-09-23 MED ORDER — SERTRALINE HCL 25 MG PO TABS: 25.0000 mg | ORAL_TABLET | Freq: Every day | ORAL | 11 refills | Status: DC

## 2016-09-23 NOTE — Assessment & Plan Note (Signed)
Continue low dose levothyroxine. TSH stable.

## 2016-09-23 NOTE — Assessment & Plan Note (Signed)
Ongoing. PRN meclizine. Likely hydrocephalus related. Recent head CT at ER was stable.

## 2016-09-23 NOTE — Assessment & Plan Note (Signed)
Preventative protocols reviewed and updated unless pt declined. Discussed healthy diet and lifestyle.  

## 2016-09-23 NOTE — Assessment & Plan Note (Signed)

## 2016-09-23 NOTE — Progress Notes (Signed)
Pre visit review using our clinic review tool, if applicable. No additional management support is needed unless otherwise documented below in the visit note. 

## 2016-09-23 NOTE — Assessment & Plan Note (Signed)
Advanced directive discussion - has not set up. Would want husband to be HCPOA then BIL. Advanced planning packet provided today.

## 2016-09-23 NOTE — Assessment & Plan Note (Signed)
Endorses no seizures in at least 3 yrs, controlled on keppra.

## 2016-09-23 NOTE — Assessment & Plan Note (Signed)
Failed vision screen - anticipate unsteadiness largely related to poor vision. Recommended return to eye doctor.

## 2016-09-23 NOTE — Assessment & Plan Note (Signed)
Stable. Continue Leah Payne, myrbetriq

## 2016-09-23 NOTE — Progress Notes (Signed)
BP 138/90   Pulse 72   Temp 98.1 F (36.7 C) (Oral)   Ht 5\' 1"  (1.549 m)   Wt 148 lb 8 oz (67.4 kg)   BMI 28.06 kg/m    CC: medicare wellness visit Subjective:    Patient ID: Leah Payne, female    DOB: 06/16/1946, 71 y.o.   MRN: [id number]  HPI: Leah BellsMargaret L Willingham is a 71 y.o. female presenting on 09/23/2016 for Annual Exam (per husband-has been having arm pain and sleeping a lot)   See prior note for details. Husband stays in waiting room today. Last visit 08/2016 MMSE 28/30, clock drawing 2/4. Independent ADLs, largely dependent in IADLs. 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 Recent labs showed low normal B12 and A1c 6.2%, LDL 103. Husband mentioned some arm pain - pt states this has resolved.   She thinks she's been taking sertraline 25mg  daily. Feels mood and memory are "doing better".   Known congenital hydrocephalus with mild cognitive impairment. Declined neurology referral or MRI for further evaluation of chronic known hydrocephalus (financial concerns). Prior saw Dr Gerlene FeeKritzer who did not think patient would benefit from shunt placement. Prior saw Dr Karel JarvisAquino for recurrent seizure 07/2013 with worsening memory loss, never kept f/u appt. Last seen 02/2016. Husband concerned about noted progressive decline in patient. Increased fatigue.   Hearing screen - passed Vision screen - failed (200R, 200L, 70B) Fall risk screen - 1 fall without injury Depression screen - passed  Preventative: Colon cancer screening - discussed - would like iFOB.  Lung cancer screening - never smoker Breast cancer screening - last mammo 2013. Agrees to repeat mammogram. Well woman exam - none recently. Menopause around age 71yo. Declines today. Denies bleeding or pelvic pain. Offered scheduling in office or GYN referral DEXA scan - agrees to schedule with mammogram Flu shot - yearly prevnar 2018, penumovax 2016 Tetanus - unsure  Shingles shot - not interested  Advanced directive  discussion - has not set up. Would want husband to be HCPOA then BIL. Advanced planning packet provided today.  Seat belt use discussed Sunscreen use discussed. No changing moles on skin Non smoker Alcohol - none  Lives with husband, 2 dogs [place] Occupation: retired, was housewife Edu: completed HS GED Activity: no regular exercise. Dizziness precludes activity Diet: good water, fruits/vegetables daily  Relevant past medical, surgical, family and social history reviewed and updated as indicated. Interim medical history since our last visit reviewed. Allergies and medications reviewed and updated. Outpatient Medications Prior to Visit  Medication Sig Dispense Refill  . atorvastatin (LIPITOR) 40 MG tablet Take 1 tablet (40 mg total) by mouth every evening. 90 tablet 1  . cyanocobalamin (V-R VITAMIN B-12) 500 MCG tablet Take 1 tablet (500 mcg total) by mouth daily.    Marland Kitchen. ibuprofen (ADVIL,MOTRIN) 200 MG tablet Take 400 mg by mouth at bedtime.    . levETIRAcetam (KEPPRA) 500 MG tablet Take 1 tablet (500 mg total) by mouth 2 (two) times daily. 180 tablet 1  . levothyroxine (SYNTHROID, LEVOTHROID) 25 MCG tablet TAKE 1 TABLET BY MOUTH DAILY BEFORE BREAKFAST. 90 tablet 1  . meclizine (ANTIVERT) 12.5 MG tablet Take 1 tablet (12.5 mg total) by mouth 3 (three) times daily as needed for dizziness. 60 tablet 1  . metFORMIN (GLUCOPHAGE) 500 MG tablet Take 1 tablet (500 mg total) by mouth daily with breakfast. 90 tablet 3  . mirabegron ER ([name]) 50 MG TB24 tablet Take 1 tablet (50 mg total)  by mouth at bedtime. 90 tablet 3  . propranolol (INDERAL) 10 MG tablet Take 1 tablet (10 mg total) by mouth 2 (two) times daily. 180 tablet 1  . [name] 8 MG TB24 tablet TAKE 1 TABLET EVERY DAY 90 tablet 1  . sertraline (ZOLOFT) 25 MG tablet Take 1 tablet (25 mg total) by mouth daily. 30 tablet 3   No facility-administered medications prior to visit.      Per HPI unless specifically indicated in  ROS section below Review of Systems  Constitutional: Negative for activity change, appetite change, chills, fatigue, fever and unexpected weight change.  HENT: Negative for hearing loss.   Eyes: Negative for visual disturbance.  Respiratory: Negative for cough, chest tightness, shortness of breath and wheezing.   Cardiovascular: Negative for chest pain, palpitations and leg swelling.  Gastrointestinal: Negative for abdominal distention, abdominal pain, blood in stool, constipation, diarrhea, nausea and vomiting.  Genitourinary: Negative for difficulty urinating and hematuria.  Musculoskeletal: Negative for arthralgias, myalgias and neck pain.  Skin: Negative for rash.  Neurological: Positive for dizziness. Negative for seizures, syncope and headaches.  Hematological: Negative for adenopathy. Does not bruise/bleed easily.  Psychiatric/Behavioral: Negative for dysphoric mood. The patient is not nervous/anxious.        Objective:    BP 138/90   Pulse 72   Temp 98.1 F (36.7 C) (Oral)   Ht 5\' 1"  (1.549 m)   Wt 148 lb 8 oz (67.4 kg)   BMI 28.06 kg/m   Wt Readings from Last 3 Encounters:  09/23/16 148 lb 8 oz (67.4 kg)  08/25/16 148 lb (67.1 kg)  03/23/16 144 lb 4 oz (65.4 kg)    Physical Exam  Constitutional: She appears well-developed and well-nourished. No distress.  HENT:  Head: Normocephalic and atraumatic.  Right Ear: Hearing, tympanic membrane, external ear and ear canal normal.  Left Ear: Hearing, tympanic membrane, external ear and ear canal normal.  Nose: Nose normal.  Mouth/Throat: Uvula is midline, oropharynx is clear and moist and mucous membranes are normal. No oropharyngeal exudate, posterior oropharyngeal edema or posterior oropharyngeal erythema.  Eyes: Conjunctivae and EOM are normal. Pupils are equal, round, and reactive to light. No scleral icterus.  Neck: Normal range of motion. Neck supple. Carotid bruit is not present. No thyromegaly present.    Cardiovascular: Normal rate, regular rhythm, normal heart sounds and intact distal pulses.   No murmur heard. Pulses:      Radial pulses are 2+ on the right side, and 2+ on the left side.  Pulmonary/Chest: Effort normal and breath sounds normal. No respiratory distress. She has no wheezes. She has no rales.  Breast - declined  Abdominal: Soft. Bowel sounds are normal. She exhibits no distension and no mass. There is no tenderness. There is no rebound and no guarding.  Genitourinary:  Genitourinary Comments: GYN - declined  Musculoskeletal: Normal range of motion. She exhibits no edema.  polydactyly  Lymphadenopathy:    She has no cervical adenopathy.  Neurological: She is alert. Gait abnormal.  Recall 2/3, 3/3 with cue Walks with walker. Needs assistance getting on and off exam table Very unsteady stuttering gait  Skin: Skin is warm and dry. No rash noted.  Psychiatric: She has a normal mood and affect. Her behavior is normal. Judgment and thought content normal.  Nursing note and vitals reviewed.  Results for orders placed or performed in visit on 08/25/16  TSH  Result Value Ref Range   TSH 2.23 0.35 - 4.50 uIU/mL  Vitamin B12  Result Value Ref Range   Vitamin B-12 287 211 - 911 pg/mL  Hepatitis C antibody  Result Value Ref Range   HCV Ab NEGATIVE NEGATIVE  Hemoglobin A1c  Result Value Ref Range   Hgb A1c MFr Bld 6.2 4.6 - 6.5 %  Lipid panel  Result Value Ref Range   Cholesterol 175 0 - 200 mg/dL   Triglycerides 16.1 0.0 - 149.0 mg/dL   HDL 09.60 >45.40 mg/dL   VLDL 98.1 0.0 - 19.1 mg/dL   LDL Cholesterol 478 (H) 0 - 99 mg/dL   Total CHOL/HDL Ratio 3    NonHDL 121.08       Assessment & Plan:   Problem List Items Addressed This Visit    Advanced care planning/counseling discussion    Advanced directive discussion - has not set up. Would want husband to be HCPOA then BIL. Advanced planning packet provided today.       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HCC)    Stable  period. Has declined return to neurology.       Dizziness    Ongoing. PRN meclizine. Likely hydrocephalus related. Recent head CT at ER was stable.       Health maintenance examination    Preventative protocols reviewed and updated unless pt declined. Discussed healthy diet and lifestyle.       Hyperlipidemia    Chronic, stable. Continue atorvastatin.       Hypertension    Chronic, stable. Continue propranolol 10mg  bid.       Hypothyroidism    Continue low dose levothyroxine. TSH stable.       Imbalance    Failed vision screen - anticipate unsteadiness largely related to poor vision. Recommended return to eye doctor.       MCI (mild cognitive impairment) with memory loss    Stable period. Pt feels sertraline may have helped some.       Medicare annual wellness visit, initial - Primary    I have personally reviewed the Medicare Annual Wellness questionnaire and have noted 1. The patient's medical and social history 2. Their use of alcohol, tobacco or illicit drugs 3. Their current medications and supplements 4. The patient's functional ability including ADL's, fall risks, home safety risks and hearing or visual impairment. Cognitive function has been assessed and addressed as indicated.  5. Diet and physical activity 6. Evidence for depression or mood disorders The patients weight, height, BMI have been recorded in the chart. I have made referrals, counseling and provided education to the patient based on review of the above and I have provided the pt with a written personalized care plan for preventive services. Provider list updated.. See scanned questionairre as needed for further documentation. Reviewed preventative protocols and updated unless pt declined.       Overactive bladder    Stable. Continue Marianne Sofia      Seizure disorder (HCC)    Endorses no seizures in at least 3 yrs, controlled on keppra.      Type 2 diabetes, controlled, with peripheral  neuropathy (HCC)    Chronic, stable. Continue metformin 500mg  daily. Consider decreased dose.       Relevant Medications   sertraline (ZOLOFT) 25 MG tablet    Other Visit Diagnoses    Screening for breast cancer       Relevant Orders   MM Digital Screening   Special screening for malignant neoplasms, colon       Relevant Orders   Fecal occult blood, imunochemical  Estrogen deficiency       Relevant Orders   DG Bone Density       Follow up plan: Return in about 4 months (around 01/23/2017) for follow up visit.  Eustaquio Boyden, MD

## 2016-09-23 NOTE — Assessment & Plan Note (Signed)
Chronic, stable. Continue propranolol 10mg  bid.

## 2016-09-23 NOTE — Assessment & Plan Note (Signed)
Stable period. Pt feels sertraline may have helped some.

## 2016-09-23 NOTE — Patient Instructions (Addendum)
Continue sertraline and other medicines. Schedule eye doctor appointment at your convenience - you failed vision screen today. I think a large component of unsteadiness if poor vision.  Pass by lab to pick up a stool kit We will schedule mammogram for you and bone density scan for you.  Advanced directive packet provided today.  Return to see me in 4 months for follow up visit.   Health Maintenance, Female Adopting a healthy lifestyle and getting preventive care can go a long way to promote health and wellness. Talk with your health care provider about what schedule of regular examinations is right for you. This is a good chance for you to check in with your provider about disease prevention and staying healthy. In between checkups, there are plenty of things you can do on your own. Experts have done a lot of research about which lifestyle changes and preventive measures are most likely to keep you healthy. Ask your health care provider for more information. Weight and diet Eat a healthy diet  Be sure to include plenty of vegetables, fruits, low-fat dairy products, and lean protein.  Do not eat a lot of foods high in solid fats, added sugars, or salt.  Get regular exercise. This is one of the most important things you can do for your health.  Most adults should exercise for at least 150 minutes each week. The exercise should increase your heart rate and make you sweat (moderate-intensity exercise).  Most adults should also do strengthening exercises at least twice a week. This is in addition to the moderate-intensity exercise. Maintain a healthy weight  Body mass index (BMI) is a measurement that can be used to identify possible weight problems. It estimates body fat based on height and weight. Your health care provider can help determine your BMI and help you achieve or maintain a healthy weight.  For females 2 years of age and older:  A BMI below 18.5 is considered underweight.  A  BMI of 18.5 to 24.9 is normal.  A BMI of 25 to 29.9 is considered overweight.  A BMI of 30 and above is considered obese. Watch levels of cholesterol and blood lipids  You should start having your blood tested for lipids and cholesterol at 72 years of age, then have this test every 5 years.  You may need to have your cholesterol levels checked more often if:  Your lipid or cholesterol levels are high.  You are older than 72 years of age.  You are at high risk for heart disease. Cancer screening Lung Cancer  Lung cancer screening is recommended for adults 38-74 years old who are at high risk for lung cancer because of a history of smoking.  A yearly low-dose CT scan of the lungs is recommended for people who:  Currently smoke.  Have quit within the past 15 years.  Have at least a 30-pack-year history of smoking. A pack year is smoking an average of one pack of cigarettes a day for 1 year.  Yearly screening should continue until it has been 15 years since you quit.  Yearly screening should stop if you develop a health problem that would prevent you from having lung cancer treatment. Breast Cancer  Practice breast self-awareness. This means understanding how your breasts normally appear and feel.  It also means doing regular breast self-exams. Let your health care provider know about any changes, no matter how small.  If you are in your 20s or 30s, you should have a  clinical breast exam (CBE) by a health care provider every 1-3 years as part of a regular health exam.  If you are 19 or older, have a CBE every year. Also consider having a breast X-ray (mammogram) every year.  If you have a family history of breast cancer, talk to your health care provider about genetic screening.  If you are at high risk for breast cancer, talk to your health care provider about having an MRI and a mammogram every year.  Breast cancer gene (BRCA) assessment is recommended for women who have  family members with BRCA-related cancers. BRCA-related cancers include:  Breast.  Ovarian.  Tubal.  Peritoneal cancers.  Results of the assessment will determine the need for genetic counseling and BRCA1 and BRCA2 testing. Cervical Cancer  Your health care provider may recommend that you be screened regularly for cancer of the pelvic organs (ovaries, uterus, and vagina). This screening involves a pelvic examination, including checking for microscopic changes to the surface of your cervix (Pap test). You may be encouraged to have this screening done every 3 years, beginning at age 78.  For women ages 84-65, health care providers may recommend pelvic exams and Pap testing every 3 years, or they may recommend the Pap and pelvic exam, combined with testing for human papilloma virus (HPV), every 5 years. Some types of HPV increase your risk of cervical cancer. Testing for HPV may also be done on women of any age with unclear Pap test results.  Other health care providers may not recommend any screening for nonpregnant women who are considered low risk for pelvic cancer and who do not have symptoms. Ask your health care provider if a screening pelvic exam is right for you.  If you have had past treatment for cervical cancer or a condition that could lead to cancer, you need Pap tests and screening for cancer for at least 20 years after your treatment. If Pap tests have been discontinued, your risk factors (such as having a new sexual partner) need to be reassessed to determine if screening should resume. Some women have medical problems that increase the chance of getting cervical cancer. In these cases, your health care provider may recommend more frequent screening and Pap tests. Colorectal Cancer  This type of cancer can be detected and often prevented.  Routine colorectal cancer screening usually begins at 72 years of age and continues through 72 years of age.  Your health care provider may  recommend screening at an earlier age if you have risk factors for colon cancer.  Your health care provider may also recommend using home test kits to check for hidden blood in the stool.  A small camera at the end of a tube can be used to examine your colon directly (sigmoidoscopy or colonoscopy). This is done to check for the earliest forms of colorectal cancer.  Routine screening usually begins at age 38.  Direct examination of the colon should be repeated every 5-10 years through 72 years of age. However, you may need to be screened more often if early forms of precancerous polyps or small growths are found. Skin Cancer  Check your skin from head to toe regularly.  Tell your health care provider about any new moles or changes in moles, especially if there is a change in a mole's shape or color.  Also tell your health care provider if you have a mole that is larger than the size of a pencil eraser.  Always use sunscreen. Apply sunscreen  liberally and repeatedly throughout the day.  Protect yourself by wearing long sleeves, pants, a wide-brimmed hat, and sunglasses whenever you are outside. Heart disease, diabetes, and high blood pressure  High blood pressure causes heart disease and increases the risk of stroke. High blood pressure is more likely to develop in:  People who have blood pressure in the high end of the normal range (130-139/85-89 mm Hg).  People who are overweight or obese.  People who are African American.  If you are 72-19 years of age, have your blood pressure checked every 3-5 years. If you are 71 years of age or older, have your blood pressure checked every year. You should have your blood pressure measured twice-once when you are at a hospital or clinic, and once when you are not at a hospital or clinic. Record the average of the two measurements. To check your blood pressure when you are not at a hospital or clinic, you can use:  An automated blood pressure  machine at a pharmacy.  A home blood pressure monitor.  If you are between 13 years and 55 years old, ask your health care provider if you should take aspirin to prevent strokes.  Have regular diabetes screenings. This involves taking a blood sample to check your fasting blood sugar level.  If you are at a normal weight and have a low risk for diabetes, have this test once every three years after 72 years of age.  If you are overweight and have a high risk for diabetes, consider being tested at a younger age or more often. Preventing infection Hepatitis B  If you have a higher risk for hepatitis B, you should be screened for this virus. You are considered at high risk for hepatitis B if:  You were born in a country where hepatitis B is common. Ask your health care provider which countries are considered high risk.  Your parents were born in a high-risk country, and you have not been immunized against hepatitis B (hepatitis B vaccine).  You have HIV or AIDS.  You use needles to inject street drugs.  You live with someone who has hepatitis B.  You have had sex with someone who has hepatitis B.  You get hemodialysis treatment.  You take certain medicines for conditions, including cancer, organ transplantation, and autoimmune conditions. Hepatitis C  Blood testing is recommended for:  Everyone born from [birth year] through 1966.  Anyone with known risk factors for hepatitis C. Sexually transmitted infections (STIs)  You should be screened for sexually transmitted infections (STIs) including gonorrhea and chlamydia if:  You are sexually active and are younger than 72 years of age.  You are older than 72 years of age and your health care provider tells you that you are at risk for this type of infection.  Your sexual activity has changed since you were last screened and you are at an increased risk for chlamydia or gonorrhea. Ask your health care provider if you are at risk.  If  you do not have HIV, but are at risk, it may be recommended that you take a prescription medicine daily to prevent HIV infection. This is called pre-exposure prophylaxis (PrEP). You are considered at risk if:  You are sexually active and do not regularly use condoms or know the HIV status of your partner(s).  You take drugs by injection.  You are sexually active with a partner who has HIV. Talk with your health care provider about whether you are at  high risk of being infected with HIV. If you choose to begin PrEP, you should first be tested for HIV. You should then be tested every 3 months for as long as you are taking PrEP. Pregnancy  If you are premenopausal and you may become pregnant, ask your health care provider about preconception counseling.  If you may become pregnant, take 400 to 800 micrograms (mcg) of folic acid every day.  If you want to prevent pregnancy, talk to your health care provider about birth control (contraception). Osteoporosis and menopause  Osteoporosis is a disease in which the bones lose minerals and strength with aging. This can result in serious bone fractures. Your risk for osteoporosis can be identified using a bone density scan.  If you are 30 years of age or older, or if you are at risk for osteoporosis and fractures, ask your health care provider if you should be screened.  Ask your health care provider whether you should take a calcium or vitamin D supplement to lower your risk for osteoporosis.  Menopause may have certain physical symptoms and risks.  Hormone replacement therapy may reduce some of these symptoms and risks. Talk to your health care provider about whether hormone replacement therapy is right for you. Follow these instructions at home:  Schedule regular health, dental, and eye exams.  Stay current with your immunizations.  Do not use any tobacco products including cigarettes, chewing tobacco, or electronic cigarettes.  If you are  pregnant, do not drink alcohol.  If you are breastfeeding, limit how much and how often you drink alcohol.  Limit alcohol intake to no more than 1 drink per day for nonpregnant women. One drink equals 12 ounces of beer, 5 ounces of wine, or 1 ounces of hard liquor.  Do not use street drugs.  Do not share needles.  Ask your health care provider for help if you need support or information about quitting drugs.  Tell your health care provider if you often feel depressed.  Tell your health care provider if you have ever been abused or do not feel safe at home. This information is not intended to replace advice given to you by your health care provider. Make sure you discuss any questions you have with your health care provider. Document Released: 01/24/2011 Document Revised: 12/17/2015 Document Reviewed: 04/14/2015 Elsevier Interactive Patient Education  2017 [employer].

## 2016-09-23 NOTE — Assessment & Plan Note (Signed)
Chronic, stable. Continue metformin 500mg  daily. Consider decreased dose.

## 2016-09-23 NOTE — Assessment & Plan Note (Signed)
Chronic, stable Continue atorvastatin 

## 2016-09-23 NOTE — Assessment & Plan Note (Signed)
Stable period. Has declined return to neurology.

## 2016-09-26 DIAGNOSIS — H5203 Hypermetropia, bilateral: Secondary | ICD-10-CM | POA: Diagnosis not present

## 2016-09-26 DIAGNOSIS — H524 Presbyopia: Secondary | ICD-10-CM | POA: Diagnosis not present

## 2016-09-26 DIAGNOSIS — Z0100 Encounter for examination of eyes and vision without abnormal findings: Secondary | ICD-10-CM | POA: Diagnosis not present

## 2016-09-26 DIAGNOSIS — H52209 Unspecified astigmatism, unspecified eye: Secondary | ICD-10-CM | POA: Diagnosis not present

## 2016-09-26 LAB — HM DIABETES EYE EXAM

## 2016-09-29 ENCOUNTER — Telehealth: Payer: Self-pay

## 2016-09-29 NOTE — Telephone Encounter (Signed)
[facility] and for pt to contact [facility]. I advised Mr Leah Payne to contact the pharmacy  And if they do not have pt having refills ask [employer] to contact our office. Pt said he is not sure they will do that but he will try. I advised if [employer] was not helpful to call me back and I would contact pharmacy for him. Mr Leah Payne voiced understanding.

## 2016-10-18 ENCOUNTER — Encounter: Payer: Self-pay | Admitting: Family Medicine

## 2016-10-26 ENCOUNTER — Ambulatory Visit: Payer: Medicare HMO | Admitting: Family Medicine

## 2016-10-28 ENCOUNTER — Other Ambulatory Visit: Payer: Medicare HMO

## 2016-10-28 ENCOUNTER — Ambulatory Visit: Payer: Medicare HMO

## 2016-12-06 ENCOUNTER — Other Ambulatory Visit: Payer: Self-pay | Admitting: Family Medicine

## 2017-01-18 ENCOUNTER — Other Ambulatory Visit: Payer: Self-pay | Admitting: Family Medicine

## 2017-01-26 ENCOUNTER — Ambulatory Visit: Payer: Medicare HMO | Admitting: Family Medicine

## 2017-01-30 ENCOUNTER — Other Ambulatory Visit: Payer: Self-pay | Admitting: Family Medicine

## 2017-02-01 ENCOUNTER — Encounter: Payer: Self-pay | Admitting: Family Medicine

## 2017-02-01 ENCOUNTER — Ambulatory Visit (INDEPENDENT_AMBULATORY_CARE_PROVIDER_SITE_OTHER): Payer: Medicare HMO | Admitting: Family Medicine

## 2017-02-01 VITALS — BP 128/78 | HR 88 | Temp 97.9°F | Wt 142.5 lb

## 2017-02-01 DIAGNOSIS — R5382 Chronic fatigue, unspecified: Secondary | ICD-10-CM | POA: Diagnosis not present

## 2017-02-01 DIAGNOSIS — Q039 Congenital hydrocephalus, unspecified: Secondary | ICD-10-CM | POA: Diagnosis not present

## 2017-02-01 DIAGNOSIS — I456 Pre-excitation syndrome: Secondary | ICD-10-CM

## 2017-02-01 DIAGNOSIS — G3184 Mild cognitive impairment, so stated: Secondary | ICD-10-CM | POA: Diagnosis not present

## 2017-02-01 DIAGNOSIS — E1142 Type 2 diabetes mellitus with diabetic polyneuropathy: Secondary | ICD-10-CM

## 2017-02-01 LAB — CBC WITH DIFFERENTIAL/PLATELET
Basophils Absolute: 0.1 10*3/uL (ref 0.0–0.1)
Basophils Relative: 1.2 % (ref 0.0–3.0)
Eosinophils Absolute: 0.2 10*3/uL (ref 0.0–0.7)
Eosinophils Relative: 3.6 % (ref 0.0–5.0)
HCT: 38.6 % (ref 36.0–46.0)
Hemoglobin: 13 g/dL (ref 12.0–15.0)
Lymphocytes Relative: 41.7 % (ref 12.0–46.0)
Lymphs Abs: 1.9 10*3/uL (ref 0.7–4.0)
MCHC: 33.8 g/dL (ref 30.0–36.0)
MCV: 84.2 fl (ref 78.0–100.0)
Monocytes Absolute: 0.4 10*3/uL (ref 0.1–1.0)
Monocytes Relative: 9 % (ref 3.0–12.0)
Neutro Abs: 2.1 10*3/uL (ref 1.4–7.7)
Neutrophils Relative %: 44.5 % (ref 43.0–77.0)
Platelets: 302 10*3/uL (ref 150.0–400.0)
RBC: 4.58 Mil/uL (ref 3.87–5.11)
RDW: 12.9 % (ref 11.5–15.5)
WBC: 4.6 10*3/uL (ref 4.0–10.5)

## 2017-02-01 LAB — COMPREHENSIVE METABOLIC PANEL
ALT: 18 U/L (ref 0–35)
AST: 16 U/L (ref 0–37)
Albumin: 4.4 g/dL (ref 3.5–5.2)
Alkaline Phosphatase: 68 U/L (ref 39–117)
BUN: 6 mg/dL (ref 6–23)
CO2: 29 mEq/L (ref 19–32)
Calcium: 9.8 mg/dL (ref 8.4–10.5)
Chloride: 99 mEq/L (ref 96–112)
Creatinine, Ser: 0.66 mg/dL (ref 0.40–1.20)
GFR: 93.62 mL/min (ref >60.00)
Glucose, Bld: 100 mg/dL — ABNORMAL HIGH (ref 70–99)
Potassium: 4 mEq/L (ref 3.5–5.1)
Sodium: 134 mEq/L — ABNORMAL LOW (ref 135–145)
Total Bilirubin: 0.5 mg/dL (ref 0.2–1.2)
Total Protein: 8 g/dL (ref 6.0–8.3)

## 2017-02-01 LAB — VITAMIN D 25 HYDROXY (VIT D DEFICIENCY, FRACTURES): VITD: 22.23 ng/mL — ABNORMAL LOW (ref 30.00–100.00)

## 2017-02-01 LAB — VITAMIN B12: Vitamin B-12: 447 pg/mL (ref 211–911)

## 2017-02-01 LAB — HEMOGLOBIN A1C: Hgb A1c MFr Bld: 6.3 % (ref 4.6–6.5)

## 2017-02-01 NOTE — Assessment & Plan Note (Signed)
Actually some improvement noted with addition of sertraline.

## 2017-02-01 NOTE — Patient Instructions (Addendum)
Labs today to check for other causes of physical fatigue.  I'm glad memory is doing a little better.  Try to get 3 meals a day, include protein in each meal. If not, then add a 1/2-1 can of glucerna each day.  If doing well, return after 10/13/2017 for medicare wellness visit with Virl AxeLesia and physical with me. If not, return sooner.

## 2017-02-01 NOTE — Assessment & Plan Note (Signed)
-  Continue propranolol 

## 2017-02-01 NOTE — Assessment & Plan Note (Signed)
Chronic, stable. Update A1c.  

## 2017-02-01 NOTE — Progress Notes (Signed)
BP 128/78   Pulse 88   Temp 97.9 F (36.6 C) (Oral)   Wt 142 lb 8 oz (64.6 kg)   SpO2 95%   BMI 26.93 kg/m    CC: 66mo f/u visit Subjective:    Patient ID: Leah Payne, female    DOB: 01/30/1946, 71 y.o.   MRN: [id number]  HPI: Leah Payne is a 71 y.o. female presenting on 02/01/2017 for Follow-up   See prior note for details. 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 Both she and Leah Payne think memory has gotten better. Ongoing fatigue, low energy, no appetite. Compliant with current medicines.   Known congenital hydrocephalus with mild cognitive impairment. Declined neurology referral or MRI for further evaluation of chronic known hydrocephalus (financial concerns). Prior saw Dr Gerlene Fee who did not think patient would benefit from shunt placement. Prior saw Dr Karel Jarvis for recurrent seizure 07/2013 with worsening memory loss, never kept f/u appt. Leah Payne concerned about noted progressive decline in patient.   Relevant past medical, surgical, family and social history reviewed and updated as indicated. Interim medical history since our last visit reviewed. Allergies and medications reviewed and updated. Outpatient Medications Prior to Visit  Medication Sig Dispense Refill  . atorvastatin (LIPITOR) 40 MG tablet TAKE 1 TABLET EVERY EVENING 90 tablet 1  . cyanocobalamin (V-R VITAMIN B-12) 500 MCG tablet Take 1 tablet (500 mcg total) by mouth daily.    Marland Kitchen ibuprofen (ADVIL,MOTRIN) 200 MG tablet Take 400 mg by mouth at bedtime.    . levETIRAcetam (KEPPRA) 500 MG tablet TAKE 1 TABLET TWICE DAILY 180 tablet 1  . levothyroxine (SYNTHROID, LEVOTHROID) 25 MCG tablet TAKE 1 TABLET DAILY BEFORE BREAKFAST. 90 tablet 1  . metFORMIN (GLUCOPHAGE) 500 MG tablet TAKE 1 TABLET EVERY DAY WITH BREAKFAST 90 tablet 3  . [name] 50 MG TB24 tablet TAKE 1 TABLET AT BEDTIME 90 tablet 3  . propranolol (INDERAL) 10 MG tablet TAKE 1 TABLET TWICE DAILY 180 tablet 2  . sertraline  (ZOLOFT) 25 MG tablet Take 1 tablet (25 mg total) by mouth daily. 30 tablet 11  . [name] 8 MG TB24 tablet TAKE 1 TABLET EVERY DAY 90 tablet 1  . meclizine (ANTIVERT) 12.5 MG tablet Take 1 tablet (12.5 mg total) by mouth 3 (three) times daily as needed for dizziness. (Patient not taking: Reported on 02/01/2017) 60 tablet 1   No facility-administered medications prior to visit.      Per HPI unless specifically indicated in ROS section below Review of Systems     Objective:    BP 128/78   Pulse 88   Temp 97.9 F (36.6 C) (Oral)   Wt 142 lb 8 oz (64.6 kg)   SpO2 95%   BMI 26.93 kg/m   Wt Readings from Last 3 Encounters:  02/01/17 142 lb 8 oz (64.6 kg)  09/23/16 148 lb 8 oz (67.4 kg)  08/25/16 148 lb (67.1 kg)    Physical Exam  Constitutional: She appears well-developed and well-nourished. No distress.  HENT:  Mouth/Throat: Oropharynx is clear and moist. No oropharyngeal exudate.  Eyes: Conjunctivae and EOM are normal. Pupils are equal, round, and reactive to light. No scleral icterus.  Cardiovascular: Normal rate, regular rhythm, normal heart sounds and intact distal pulses.   No murmur heard. Pulmonary/Chest: Effort normal and breath sounds normal. No respiratory distress. She has no wheezes. She has no rales.  Musculoskeletal: She exhibits no edema.  polydactyly  Skin: Skin is warm and dry. No rash  noted.  hirsutism  Psychiatric: She has a normal mood and affect.  Nursing note and vitals reviewed.  Results for orders placed or performed in visit on 10/18/16  HM DIABETES EYE EXAM  Result Value Ref Range   HM Diabetic Eye Exam No Retinopathy No Retinopathy      Assessment & Plan:   Problem List Items Addressed This Visit    Chronic fatigue - Primary    Leah Payne's main concern. Check for reversible causes today. Discussed importance of good nutrition, recommended 3 meals a day, suggested glucerna if this goal not achieved.       Relevant Orders   Comprehensive  metabolic panel   CBC with Differential/Platelet   Vitamin B12   VITAMIN D 25 Hydroxy (Vit-D Deficiency, Fractures)   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HCC)   MCI (mild cognitive impairment) with memory loss    Actually some improvement noted with addition of sertraline.       Type 2 diabetes, controlled, 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HCC)    Chronic, stable. Update A1c.       Relevant Orders   Hemoglobin A1c   WPW (Wolff-Parkinson-White syndrome)    Continue propranolol           Follow up plan: Return for annual exam, prior fasting for blood work, medicare wellness visit.  Leah BoydenJavier Maheen Cwikla, MD

## 2017-02-01 NOTE — Assessment & Plan Note (Signed)
Husband's main concern. Check for reversible causes today. Discussed importance of good nutrition, recommended 3 meals a day, suggested glucerna if this goal not achieved.

## 2017-02-05 ENCOUNTER — Other Ambulatory Visit: Payer: Self-pay | Admitting: Family Medicine

## 2017-02-05 MED ORDER — VITAMIN D3 25 MCG (1000 UT) PO CAPS: 1.0000 | ORAL_CAPSULE | Freq: Every day | ORAL | Status: DC

## 2017-02-06 ENCOUNTER — Encounter: Payer: Self-pay | Admitting: *Deleted

## 2017-03-21 ENCOUNTER — Ambulatory Visit (INDEPENDENT_AMBULATORY_CARE_PROVIDER_SITE_OTHER): Payer: Medicare HMO | Admitting: Family Medicine

## 2017-03-21 ENCOUNTER — Encounter: Payer: Self-pay | Admitting: Family Medicine

## 2017-03-21 VITALS — BP 154/72 | HR 69 | Temp 97.9°F | Wt 136.0 lb

## 2017-03-21 DIAGNOSIS — E559 Vitamin D deficiency, unspecified: Secondary | ICD-10-CM

## 2017-03-21 DIAGNOSIS — R531 Weakness: Secondary | ICD-10-CM | POA: Diagnosis not present

## 2017-03-21 NOTE — Progress Notes (Addendum)
BP (!) 154/72   Pulse 69   Temp 97.9 F (36.6 C) (Oral)   Wt 136 lb (61.7 kg)   SpO2 97%   BMI 25.70 kg/m    CC: memory loss, weakness and fatigue Subjective:    Patient ID: Leah Payne, female    DOB: [birth year], 71 y.o.   MRN: [id number]  HPI: Leah Payne is a 71 y.o. female presenting on 03/21/2017 for Memory Loss and Fatigue   Labs last month were overall very stable, just showing low vitamin D. She is compliant with vit D 1000 units.   Never returned iFOB.  Never went for mammogram.  Has declined pelvic exam.   Pt denies any concerns. She states she's feeling well.  Weight continues to drop.   Denies fevers/chills, chest pain, dyspnea, diaphoresis or night sweats, headaches, denies significant fatigue. No skin rash, abd pain, nausea/vomiting, diarrhea, constipation, blood in stool or urine.   Tolerating sertraline well 25mg  daily.   Known congenital hydrocephalus with mild cognitive impairment. Declined neurology referral or MRI for further evaluation of chronic known hydrocephalus (financial concerns). Husband remains concerned because he feels she has had progressive decline.   No witnessed seizures but husband endorses spells where she is not responsive to him when talking to her - this can last several minutes at a time.  Pt endorses appetite is down.   Geriatric Assessment: - deferred due to husband's illness on same day. Activities of Daily Living:     Bathing-    Dressing-    Eating-    Toileting-    Transferring-    Continence- Overall Assessment:  Instrumental Activities of Daily Living:     Transportation-    Meal/Food Preparation-    Shopping Errands-    Housekeeping/Chores-    Money Management/Finances-    Medication Management-    Ability to Use Telephone-    Laundry- Overall Assessment:    Mental Status Exam: (value/max value)   Clock Drawing Score: /4   Relevant past medical, surgical, family and social history reviewed and  updated as indicated. Interim medical history since our last visit reviewed. Allergies and medications reviewed and updated. Outpatient Medications Prior to Visit  Medication Sig Dispense Refill  . atorvastatin (LIPITOR) 40 MG tablet TAKE 1 TABLET EVERY EVENING 90 tablet 1  . Cholecalciferol (VITAMIN D3) 1000 units CAPS Take 1 capsule (1,000 Units total) by mouth daily. 30 capsule   . cyanocobalamin (V-R VITAMIN B-12) 500 MCG tablet Take 1 tablet (500 mcg total) by mouth daily.    Marland Kitchen ibuprofen (ADVIL,MOTRIN) 200 MG tablet Take 400 mg by mouth at bedtime.    . levETIRAcetam (KEPPRA) 500 MG tablet TAKE 1 TABLET TWICE DAILY 180 tablet 1  . levothyroxine (SYNTHROID, LEVOTHROID) 25 MCG tablet TAKE 1 TABLET DAILY BEFORE BREAKFAST. 90 tablet 1  . meclizine (ANTIVERT) 12.5 MG tablet Take 1 tablet (12.5 mg total) by mouth 3 (three) times daily as needed for dizziness. 60 tablet 1  . metFORMIN (GLUCOPHAGE) 500 MG tablet TAKE 1 TABLET EVERY DAY WITH BREAKFAST 90 tablet 3  . [name] 50 MG TB24 tablet TAKE 1 TABLET AT BEDTIME 90 tablet 3  . propranolol (INDERAL) 10 MG tablet TAKE 1 TABLET TWICE DAILY 180 tablet 2  . sertraline (ZOLOFT) 25 MG tablet Take 1 tablet (25 mg total) by mouth daily. 30 tablet 11  . [name] 8 MG TB24 tablet TAKE 1 TABLET EVERY DAY 90 tablet 1   No facility-administered medications prior to  visit.      Per HPI unless specifically indicated in ROS section below Review of Systems     Objective:    BP (!) 154/72   Pulse 69   Temp 97.9 F (36.6 C) (Oral)   Wt 136 lb (61.7 kg)   SpO2 97%   BMI 25.70 kg/m   Wt Readings from Last 3 Encounters:  03/21/17 136 lb (61.7 kg)  02/01/17 142 lb 8 oz (64.6 kg)  09/23/16 148 lb 8 oz (67.4 kg)    Physical Exam  Constitutional: She appears well-developed and well-nourished. No distress.  Neurological:  Unsteady gait, walks with cane  Nursing note and vitals reviewed.  Results for orders placed or performed in visit on 02/01/17    Comprehensive metabolic panel  Result Value Ref Range   Sodium 134 (L) 135 - 145 mEq/L   Potassium 4.0 3.5 - 5.1 mEq/L   Chloride 99 96 - 112 mEq/L   CO2 29 19 - 32 mEq/L   Glucose, Bld 100 (H) 70 - 99 mg/dL   BUN 6 6 - 23 mg/dL   Creatinine, Ser 7.01 0.40 - 1.20 mg/dL   Total Bilirubin 0.5 0.2 - 1.2 mg/dL   Alkaline Phosphatase 68 39 - 117 U/L   AST 16 0 - 37 U/L   ALT 18 0 - 35 U/L   Total Protein 8.0 6.0 - 8.3 g/dL   Albumin 4.4 3.5 - 5.2 g/dL   Calcium 9.8 8.4 - 77.9 mg/dL   GFR 39.03 >00.92 mL/min  CBC with Differential/Platelet  Result Value Ref Range   WBC 4.6 4.0 - 10.5 K/uL   RBC 4.58 3.87 - 5.11 Mil/uL   Hemoglobin 13.0 12.0 - 15.0 g/dL   HCT 33.0 07.6 - 22.6 %   MCV 84.2 78.0 - 100.0 fl   MCHC 33.8 30.0 - 36.0 g/dL   RDW 33.3 54.5 - 62.5 %   Platelets 302.0 150.0 - 400.0 K/uL   Neutrophils Relative % 44.5 43.0 - 77.0 %   Lymphocytes Relative 41.7 12.0 - 46.0 %   Monocytes Relative 9.0 3.0 - 12.0 %   Eosinophils Relative 3.6 0.0 - 5.0 %   Basophils Relative 1.2 0.0 - 3.0 %   Neutro Abs 2.1 1.4 - 7.7 K/uL   Lymphs Abs 1.9 0.7 - 4.0 K/uL   Monocytes Absolute 0.4 0.1 - 1.0 K/uL   Eosinophils Absolute 0.2 0.0 - 0.7 K/uL   Basophils Absolute 0.1 0.0 - 0.1 K/uL  Vitamin B12  Result Value Ref Range   Vitamin B-12 447 211 - 911 pg/mL  VITAMIN D 25 Hydroxy (Vit-D Deficiency, Fractures)  Result Value Ref Range   VITD 22.23 (L) 30.00 - 100.00 ng/mL  Hemoglobin A1c  Result Value Ref Range   Hgb A1c MFr Bld 6.3 4.6 - 6.5 %   Lab Results  Component Value Date   TSH 2.23 08/25/2016      Assessment & Plan:   Problem List Items Addressed This Visit    General weakness - Primary    Patient is not significantly concerned, however husband remains concerned. Recent normal labs. Discussed with patient. Limited visit today as husband was acutely ill. I offered neurology referral to r/o ongoing seizures as cause of inattention.  I did encourage she complete iFOB and  mammogram.       Vitamin D deficiency    She reports compliance with vit D 1000 IU daily.           Follow  up plan: Return in about 4 weeks (around 04/18/2017) for follow up visit.  Eustaquio Boyden, MD

## 2017-03-21 NOTE — Assessment & Plan Note (Signed)
She reports compliance with vit D 1000 IU daily.

## 2017-03-21 NOTE — Patient Instructions (Addendum)
See Rosaria Ferries to schedule mammogram. See lab for stool kit to test for blood.  Return in 1 month for follow up.

## 2017-03-21 NOTE — Assessment & Plan Note (Addendum)
Patient is not significantly concerned, however husband remains concerned. Recent normal labs. Discussed with patient. Limited visit today as husband was acutely ill. I offered neurology referral to r/o ongoing seizures as cause of inattention.  I did encourage she complete iFOB and mammogram.

## 2017-04-06 ENCOUNTER — Inpatient Hospital Stay: Admission: RE | Admit: 2017-04-06 | Payer: Medicare HMO | Source: Ambulatory Visit

## 2017-04-06 ENCOUNTER — Other Ambulatory Visit: Payer: Medicare HMO

## 2017-04-22 ENCOUNTER — Other Ambulatory Visit: Payer: Self-pay | Admitting: Family Medicine

## 2017-05-16 ENCOUNTER — Ambulatory Visit (INDEPENDENT_AMBULATORY_CARE_PROVIDER_SITE_OTHER)
Admission: RE | Admit: 2017-05-16 | Discharge: 2017-05-16 | Disposition: A | Discharge status: Home / Self Care | Payer: Medicare HMO | Source: Ambulatory Visit | Attending: Family Medicine | Admitting: Family Medicine

## 2017-05-16 ENCOUNTER — Encounter: Payer: Self-pay | Admitting: Family Medicine

## 2017-05-16 ENCOUNTER — Telehealth: Payer: Self-pay

## 2017-05-16 ENCOUNTER — Ambulatory Visit (INDEPENDENT_AMBULATORY_CARE_PROVIDER_SITE_OTHER): Payer: Medicare HMO | Admitting: Family Medicine

## 2017-05-16 VITALS — BP 120/84 | HR 82 | Temp 98.0°F | Wt 143.0 lb

## 2017-05-16 DIAGNOSIS — R112 Nausea with vomiting, unspecified: Secondary | ICD-10-CM | POA: Diagnosis not present

## 2017-05-16 DIAGNOSIS — R2689 Other abnormalities of gait and mobility: Secondary | ICD-10-CM

## 2017-05-16 DIAGNOSIS — R140 Abdominal distension (gaseous): Secondary | ICD-10-CM

## 2017-05-16 DIAGNOSIS — G3184 Mild cognitive impairment, so stated: Secondary | ICD-10-CM | POA: Diagnosis not present

## 2017-05-16 DIAGNOSIS — R531 Weakness: Secondary | ICD-10-CM | POA: Diagnosis not present

## 2017-05-16 DIAGNOSIS — R197 Diarrhea, unspecified: Secondary | ICD-10-CM | POA: Diagnosis not present

## 2017-05-16 DIAGNOSIS — H6121 Impacted cerumen, right ear: Secondary | ICD-10-CM | POA: Diagnosis not present

## 2017-05-16 DIAGNOSIS — G40909 Epilepsy, unspecified, not intractable, without status epilepticus: Secondary | ICD-10-CM | POA: Diagnosis not present

## 2017-05-16 DIAGNOSIS — Q039 Congenital hydrocephalus, unspecified: Secondary | ICD-10-CM

## 2017-05-16 NOTE — Progress Notes (Addendum)
BP 120/84 (BP Location: Left Arm, Patient Position: Sitting, Cuff Size: Normal)   Pulse 82   Temp 98 F (36.7 C) (Oral)   Wt 143 lb (64.9 kg)   SpO2 98%   BMI 27.02 kg/m    CC: worsening memory trouble, fatigue, acute vomiting/diarrhea this morning Subjective:    Patient ID: Leah Payne, female    DOB: August 10, 1945, 71 y.o.   MRN: [id number]  HPI: Leah Payne is a 71 y.o. female presenting on 05/16/2017 for Memory Loss (since brain incident 07/2013, worsening); Fatigue; Emesis (Started early this morning for about 3 hrs. Feeling better now); and Diarrhea (Started early this morning for about 3 hrs)   Husband remains concerned about ongoing generalized weakness and progressive dementia. Pt not concerned.   Acute illness this morning with nausea/vomiting x3, diarrhea x2 (not watery). Diarrhea is ongoing trouble - treats with imodium. Emesis NBNB. No blood in stool. No recent antibiotics. No fevers. No appetite. No sick contacts at home. No new foods or restaurants. Use well water. She regularly takes ibuprofen 400mg  at bedtime. No significant urinary symptoms.  Known congenital hydrocephalus with mild cognitive impairment. Declined neurology referral or MRI for further evaluation of chronic known hydrocephalus (financial concerns). Husband remains concerned because he feels she has had progressive decline.   No witnessed seizures but husband endorses spells where she is not responsive to him when talking to her - this can last several minutes at a time.   Never returned iFOB.  Never went for mammogram.  Has declined pelvic exam.  Weight gain noted.   Geriatric Assessment: -  Activities of Daily Living:     Bathing- independent     Dressing- independent     Eating- independent     Toileting- independent      Transferring- independent     Continence- independent  Overall Assessment: independent   Instrumental Activities of Daily Living:     Transportation-  dependent     Meal/Food Preparation- dependent     Shopping Errands- dependent    Housekeeping/Chores- independent    Money Management/Finances- dependent    Medication Management- dependent    Ability to Use Telephone- independent    Laundry- dependent (they do laundry at [employer] - they don't have washer) Overall Assessment: dependent  Relevant past medical, surgical, family and social history reviewed and updated as indicated. Interim medical history since our last visit reviewed. Allergies and medications reviewed and updated. Outpatient Medications Prior to Visit  Medication Sig Dispense Refill  . atorvastatin (LIPITOR) 40 MG tablet TAKE 1 TABLET EVERY EVENING 90 tablet 1  . Cholecalciferol (VITAMIN D3) 1000 units CAPS Take 1 capsule (1,000 Units total) by mouth daily. 30 capsule   . cyanocobalamin (V-R VITAMIN B-12) 500 MCG tablet Take 1 tablet (500 mcg total) by mouth daily.    Marland Kitchen levETIRAcetam (KEPPRA) 500 MG tablet TAKE 1 TABLET TWICE DAILY 180 tablet 1  . levothyroxine (SYNTHROID, LEVOTHROID) 25 MCG tablet TAKE 1 TABLET DAILY BEFORE BREAKFAST. 90 tablet 1  . meclizine (ANTIVERT) 12.5 MG tablet Take 1 tablet (12.5 mg total) by mouth 3 (three) times daily as needed for dizziness. 60 tablet 1  . metFORMIN (GLUCOPHAGE) 500 MG tablet TAKE 1 TABLET EVERY DAY WITH BREAKFAST 90 tablet 3  . [name] 50 MG TB24 tablet TAKE 1 TABLET AT BEDTIME 90 tablet 3  . propranolol (INDERAL) 10 MG tablet TAKE 1 TABLET TWICE DAILY 180 tablet 2  . sertraline (ZOLOFT) 25 MG tablet  Take 1 tablet (25 mg total) by mouth daily. 30 tablet 11  . [name] 8 MG TB24 tablet TAKE 1 TABLET EVERY DAY 90 tablet 1  . ibuprofen (ADVIL,MOTRIN) 200 MG tablet Take 400 mg by mouth at bedtime.     No facility-administered medications prior to visit.      Per HPI unless specifically indicated in ROS section below Review of Systems     Objective:    BP 120/84 (BP Location: Left Arm, Patient Position: Sitting, Cuff  Size: Normal)   Pulse 82   Temp 98 F (36.7 C) (Oral)   Wt 143 lb (64.9 kg)   SpO2 98%   BMI 27.02 kg/m   Wt Readings from Last 3 Encounters:  05/16/17 143 lb (64.9 kg)  03/21/17 136 lb (61.7 kg)  02/01/17 142 lb 8 oz (64.6 kg)    Physical Exam  Constitutional: She appears well-developed and well-nourished. No distress.  HENT:  Mouth/Throat: Oropharynx is clear and moist. No oropharyngeal exudate.  Cerumen impaction on right L canal clear  Cardiovascular: Normal rate, regular rhythm, normal heart sounds and intact distal pulses.   No murmur heard. Pulmonary/Chest: Effort normal and breath sounds normal. No respiratory distress. She has no wheezes. She has no rales.  Abdominal: Soft. Normal appearance and bowel sounds are normal. She exhibits distension. She exhibits no mass. There is no hepatosplenomegaly (not appreciated). There is no tenderness. There is no rebound and no guarding.  Musculoskeletal: She exhibits no edema.  Skin: Skin is warm and dry. No rash noted.  Psychiatric: She has a normal mood and affect.  Nursing note and vitals reviewed.  Results for orders placed or performed in visit on 02/01/17  Comprehensive metabolic panel  Result Value Ref Range   Sodium 134 (L) 135 - 145 mEq/L   Potassium 4.0 3.5 - 5.1 mEq/L   Chloride 99 96 - 112 mEq/L   CO2 29 19 - 32 mEq/L   Glucose, Bld 100 (H) 70 - 99 mg/dL   BUN 6 6 - 23 mg/dL   Creatinine, Ser 1.470.66 0.40 - 1.20 mg/dL   Total Bilirubin 0.5 0.2 - 1.2 mg/dL   Alkaline Phosphatase 68 39 - 117 U/L   AST 16 0 - 37 U/L   ALT 18 0 - 35 U/L   Total Protein 8.0 6.0 - 8.3 g/dL   Albumin 4.4 3.5 - 5.2 g/dL   Calcium 9.8 8.4 - 82.910.5 mg/dL   GFR 56.2193.62 >30.86>60.00 mL/min  CBC with Differential/Platelet  Result Value Ref Range   WBC 4.6 4.0 - 10.5 K/uL   RBC 4.58 3.87 - 5.11 Mil/uL   Hemoglobin 13.0 12.0 - 15.0 g/dL   HCT 57.838.6 46.936.0 - 62.946.0 %   MCV 84.2 78.0 - 100.0 fl   MCHC 33.8 30.0 - 36.0 g/dL   RDW 52.812.9 41.311.5 - 24.415.5 %    Platelets 302.0 150.0 - 400.0 K/uL   Neutrophils Relative % 44.5 43.0 - 77.0 %   Lymphocytes Relative 41.7 12.0 - 46.0 %   Monocytes Relative 9.0 3.0 - 12.0 %   Eosinophils Relative 3.6 0.0 - 5.0 %   Basophils Relative 1.2 0.0 - 3.0 %   Neutro Abs 2.1 1.4 - 7.7 K/uL   Lymphs Abs 1.9 0.7 - 4.0 K/uL   Monocytes Absolute 0.4 0.1 - 1.0 K/uL   Eosinophils Absolute 0.2 0.0 - 0.7 K/uL   Basophils Absolute 0.1 0.0 - 0.1 K/uL  Vitamin B12  Result Value Ref Range  Vitamin B-12 447 211 - 911 pg/mL  VITAMIN D 25 Hydroxy (Vit-D Deficiency, Fractures)  Result Value Ref Range   VITD 22.23 (L) 30.00 - 100.00 ng/mL  Hemoglobin A1c  Result Value Ref Range   Hgb A1c MFr Bld 6.3 4.6 - 6.5 %      Assessment & Plan:   Problem List Items Addressed This Visit    Abdominal distension - Primary    Pt denies pain but she does have significant abdominal distension noted today. This along with endorsed worsening weakness is conerning. Reassuringly, weight gain noted today.  Check 2-view abdomen xray today. Check abd [place] to eval solid organs, consider CT abd/pelvis.       Relevant Orders   DG Abd 2 Views   US Abdomen Complete   Congenital hydrocephalus [facility])    Husband concerned with endorsed progressive decline. I again recommended neurology evaluation - referral placed.       Relevant Orders   Ambulatory referral to Neurology   General weakness    Ongoing concern by husband - increased sleepiness, increased unsteadiness. Recent labwork unrevealing.      Hearing loss of right ear due to cerumen impaction    S/p successful irrigation today.       Imbalance    Ongoing. Anticipate multifactorial - vision, hydrocephalus.  Pt states vision exam last year was stable.      MCI (mild cognitive impairment) with memory loss    Husband remains worried about progressive cognitive impairment but I don't see progression. Regardless, will refer to neurology to re establish care.       Relevant Orders    Ambulatory referral to Neurology   Nausea vomiting and diarrhea    Anticipate today's episode due to viral gastroenteritis or other self limited illness. Supportive care reviewed.       Seizure disorder [facility])    Husband endorses periods where she is not responsive to him, but no witnessed seizure activity.  Continue keppra 500mg  bid. Return to neurology.       Relevant Orders   Ambulatory referral to Neurology       Follow up plan: Return if symptoms worsen or fail to improve.  Eustaquio Boyden, MD

## 2017-05-16 NOTE — Assessment & Plan Note (Signed)
Husband remains worried about progressive cognitive impairment but I don't see progression. Regardless, will refer to neurology to re establish care.

## 2017-05-16 NOTE — Assessment & Plan Note (Signed)
Pt denies pain but she does have significant abdominal distension noted today. This along with endorsed worsening weakness is conerning. Reassuringly, weight gain noted today.  Check 2-view abdomen xray today. Check abd US to eval solid organs, consider CT abd/pelvis.

## 2017-05-16 NOTE — Assessment & Plan Note (Signed)
Ongoing concern by husband - increased sleepiness, increased unsteadiness. Recent labwork unrevealing.

## 2017-05-16 NOTE — Assessment & Plan Note (Signed)
S/p successful irrigation today.

## 2017-05-16 NOTE — Addendum Note (Signed)
Addended by: Eustaquio BoydenGUTIERREZ, Elijio Staples on: 05/16/2017 01:36 PM   Modules accepted: Orders

## 2017-05-16 NOTE — Patient Instructions (Addendum)
I think you had viral illness or possible food poisoning this morning - this should continue to improve.  For abdominal distension, we will check abdominal ultrasound. I do want to set you up with neurologist - we will call you for both of these appointments.

## 2017-05-16 NOTE — Telephone Encounter (Signed)
[facility] called report for DG abd 2 views. Report is in epic and will take report to Dr Timoteo ExposeG's area now.

## 2017-05-16 NOTE — Assessment & Plan Note (Signed)
Anticipate today's episode due to viral gastroenteritis or other self limited illness. Supportive care reviewed.

## 2017-05-16 NOTE — Assessment & Plan Note (Signed)
Ongoing. Anticipate multifactorial - vision, hydrocephalus.  Pt states vision exam last year was stable.

## 2017-05-16 NOTE — Assessment & Plan Note (Addendum)
Husband endorses periods where she is not responsive to him, but no witnessed seizure activity.  Continue keppra 500mg  bid. Return to neurology.

## 2017-05-16 NOTE — Assessment & Plan Note (Signed)
Husband concerned with endorsed progressive decline. I again recommended neurology evaluation - referral placed.

## 2017-05-17 NOTE — Telephone Encounter (Signed)
Spoke with pt's husband, Zollie BeckersWalter (on dpr), relaying message per Dr. Reece AgarG. [facility].

## 2017-05-17 NOTE — Telephone Encounter (Signed)
Plz call patient - xray showed concern for possible partial obstruction which could be causing her distended abdomen.  For this reason I would like to cancel abdominal ultrasound scheduled next week and instead obtain CT scan which will better show bowels and colon.  [facility]. She will need to come in for labs as I'm getting this with contrast (no need to fast).  Lab Results  Component Value Date   CREATININE 0.66 02/01/2017

## 2017-05-18 ENCOUNTER — Other Ambulatory Visit (INDEPENDENT_AMBULATORY_CARE_PROVIDER_SITE_OTHER): Payer: Medicare HMO

## 2017-05-18 DIAGNOSIS — R140 Abdominal distension (gaseous): Secondary | ICD-10-CM | POA: Diagnosis not present

## 2017-05-18 LAB — CBC WITH DIFFERENTIAL/PLATELET
Basophils Absolute: 0 10*3/uL (ref 0.0–0.1)
Basophils Relative: 0.8 % (ref 0.0–3.0)
Eosinophils Absolute: 0.3 10*3/uL (ref 0.0–0.7)
Eosinophils Relative: 5.1 % — ABNORMAL HIGH (ref 0.0–5.0)
HCT: 40.1 % (ref 36.0–46.0)
Hemoglobin: 13.4 g/dL (ref 12.0–15.0)
Lymphocytes Relative: 33.8 % (ref 12.0–46.0)
Lymphs Abs: 1.9 10*3/uL (ref 0.7–4.0)
MCHC: 33.4 g/dL (ref 30.0–36.0)
MCV: 87 fl (ref 78.0–100.0)
Monocytes Absolute: 0.7 10*3/uL (ref 0.1–1.0)
Monocytes Relative: 12 % (ref 3.0–12.0)
Neutro Abs: 2.7 10*3/uL (ref 1.4–7.7)
Neutrophils Relative %: 48.3 % (ref 43.0–77.0)
Platelets: 276 10*3/uL (ref 150.0–400.0)
RBC: 4.61 Mil/uL (ref 3.87–5.11)
RDW: 12.9 % (ref 11.5–15.5)
WBC: 5.7 10*3/uL (ref 4.0–10.5)

## 2017-05-18 LAB — COMPREHENSIVE METABOLIC PANEL
ALT: 19 U/L (ref 0–35)
AST: 15 U/L (ref 0–37)
Albumin: 4.3 g/dL (ref 3.5–5.2)
Alkaline Phosphatase: 69 U/L (ref 39–117)
BUN: 9 mg/dL (ref 6–23)
CO2: 31 mEq/L (ref 19–32)
Calcium: 9.6 mg/dL (ref 8.4–10.5)
Chloride: 99 mEq/L (ref 96–112)
Creatinine, Ser: 0.62 mg/dL (ref 0.40–1.20)
GFR: 100.54 mL/min (ref >60.00)
Glucose, Bld: 107 mg/dL — ABNORMAL HIGH (ref 70–99)
Potassium: 4.6 mEq/L (ref 3.5–5.1)
Sodium: 134 mEq/L — ABNORMAL LOW (ref 135–145)
Total Bilirubin: 0.5 mg/dL (ref 0.2–1.2)
Total Protein: 7.4 g/dL (ref 6.0–8.3)

## 2017-05-18 NOTE — Telephone Encounter (Signed)
Called patients husband. Cancelled Abd US. Set up lab appt for today at 12:15pm, CT at [facility] for 05/19/17 at 1:00pm.

## 2017-05-19 ENCOUNTER — Inpatient Hospital Stay: Admission: RE | Admit: 2017-05-19 | Payer: Medicare HMO | Source: Ambulatory Visit

## 2017-05-24 ENCOUNTER — Ambulatory Visit (INDEPENDENT_AMBULATORY_CARE_PROVIDER_SITE_OTHER)
Admission: RE | Admit: 2017-05-24 | Discharge: 2017-05-24 | Disposition: A | Discharge status: Home / Self Care | Payer: Medicare HMO | Source: Ambulatory Visit | Attending: Family Medicine | Admitting: Family Medicine

## 2017-05-24 ENCOUNTER — Other Ambulatory Visit: Payer: Medicare HMO

## 2017-05-24 DIAGNOSIS — R140 Abdominal distension (gaseous): Secondary | ICD-10-CM | POA: Diagnosis not present

## 2017-05-24 MED ORDER — IOPAMIDOL (ISOVUE-300) INJECTION 61%
100.0000 mL | Freq: Once | INTRAVENOUS | Status: AC | PRN
  Administered 2017-05-24: 100 mL via INTRAVENOUS

## 2017-05-25 ENCOUNTER — Other Ambulatory Visit: Payer: Self-pay | Admitting: Family Medicine

## 2017-05-25 MED ORDER — MIRABEGRON ER 50 MG PO TB24: 50.0000 mg | ORAL_TABLET | Freq: Every day | ORAL | 1 refills | Status: DC

## 2017-05-25 MED ORDER — POLYETHYLENE GLYCOL 3350 17 GM/SCOOP PO POWD: 8.5000 g | Freq: Every day | ORAL | 3 refills | Status: DC

## 2017-06-21 ENCOUNTER — Other Ambulatory Visit: Payer: Self-pay | Admitting: Family Medicine

## 2017-07-27 ENCOUNTER — Telehealth: Payer: Self-pay | Admitting: Family Medicine

## 2017-07-27 ENCOUNTER — Ambulatory Visit: Payer: Medicare HMO | Admitting: Family Medicine

## 2017-07-27 NOTE — Telephone Encounter (Signed)
Ok to do. Thanks. No late fee. Thanks.

## 2017-07-27 NOTE — Telephone Encounter (Signed)
Spouse came in today for his appointment.  He stated he couldn't get [name] to come to appointment today due to weather.  Can I cancel appointment? He r/s to 1/8

## 2017-07-31 ENCOUNTER — Ambulatory Visit: Payer: Medicare HMO | Admitting: Family Medicine

## 2017-08-01 ENCOUNTER — Encounter: Payer: Self-pay | Admitting: Family Medicine

## 2017-08-01 ENCOUNTER — Ambulatory Visit (INDEPENDENT_AMBULATORY_CARE_PROVIDER_SITE_OTHER): Payer: Medicare HMO | Admitting: Family Medicine

## 2017-08-01 VITALS — BP 132/76 | HR 81 | Temp 97.9°F | Wt 148.0 lb

## 2017-08-01 DIAGNOSIS — R2689 Other abnormalities of gait and mobility: Secondary | ICD-10-CM | POA: Diagnosis not present

## 2017-08-01 DIAGNOSIS — N3281 Overactive bladder: Secondary | ICD-10-CM | POA: Diagnosis not present

## 2017-08-01 DIAGNOSIS — Y92009 Unspecified place in unspecified non-institutional (private) residence as the place of occurrence of the external cause: Secondary | ICD-10-CM

## 2017-08-01 DIAGNOSIS — G40909 Epilepsy, unspecified, not intractable, without status epilepticus: Secondary | ICD-10-CM | POA: Diagnosis not present

## 2017-08-01 DIAGNOSIS — W19XXXA Unspecified fall, initial encounter: Secondary | ICD-10-CM | POA: Diagnosis not present

## 2017-08-01 DIAGNOSIS — Q039 Congenital hydrocephalus, unspecified: Secondary | ICD-10-CM

## 2017-08-01 DIAGNOSIS — G3184 Mild cognitive impairment, so stated: Secondary | ICD-10-CM

## 2017-08-01 DIAGNOSIS — Z9181 History of falling: Secondary | ICD-10-CM | POA: Insufficient documentation

## 2017-08-01 MED ORDER — POLYETHYLENE GLYCOL 3350 17 GM/SCOOP PO POWD: 8.5000 g | Freq: Every day | ORAL | 3 refills | Status: DC

## 2017-08-01 NOTE — Assessment & Plan Note (Addendum)
Chronic, seems stable on keppra. Husband previously endorsed periods of unresponsiveness without witnessed seizure activity. Pt denies this. Will refer to neurology to re establish

## 2017-08-01 NOTE — Progress Notes (Signed)
BP 132/76 (BP Location: Left Arm, Patient Position: Sitting, Cuff Size: Normal)   Pulse 81   Temp 97.9 F (36.6 C) (Oral)   Wt 148 lb (67.1 kg)   SpO2 97%   BMI 27.96 kg/m    CC: dementia, falls Subjective:    Patient ID: Leah Payne, female    DOB: 13-Jun-1946, 72 y.o.   MRN: [id number]  HPI: Leah Payne is a 72 y.o. female presenting on 08/01/2017 for Dementia (Want to discuss. Per husband, pt loses thought process and still having issues with short-term memory.) and Fall (Happened 2-3 wks ago while home alone. Per pt, was on the floor for a couple of hrs. Had back pain for a few days but today is a lot better. )   Known congenital hydrocephalus with mild cognitive impairment. Declined neurology referral or MRI for further evaluation of chronic known hydrocephalus (financial concerns). Husband remains concerned because he feels she has had progressive decline.   Requesting new neurology referral.   Fall 2 wks ago fell in bathroom thinks missed step from bedroom to bathroom. Husband was out at store - she lay on the floor for several hours. Had back pain initially but that has improved. No LOC, denies head injury.   Has bathtub for showers, not walk in shower.  Does have shower handle but no shower chair.  Regularly uses cane when outside of the house. Doesn't use walker.  Urinary incontinence during day on [place] and myrbetriq.   Relevant past medical, surgical, family and social history reviewed and updated as indicated. Interim medical history since our last visit reviewed. Allergies and medications reviewed and updated. Outpatient Medications Prior to Visit  Medication Sig Dispense Refill  . atorvastatin (LIPITOR) 40 MG tablet TAKE 1 TABLET EVERY EVENING 90 tablet 0  . Cholecalciferol (VITAMIN D3) 1000 units CAPS Take 1 capsule (1,000 Units total) by mouth daily. 30 capsule   . levETIRAcetam (KEPPRA) 500 MG tablet TAKE 1 TABLET TWICE DAILY 180 tablet 0  .  levothyroxine (SYNTHROID, LEVOTHROID) 25 MCG tablet TAKE 1 TABLET DAILY BEFORE BREAKFAST. 90 tablet 1  . metFORMIN (GLUCOPHAGE) 500 MG tablet TAKE 1 TABLET EVERY DAY WITH BREAKFAST 90 tablet 3  . mirabegron ER ([name]) 50 MG TB24 tablet Take 1 tablet (50 mg total) by mouth at bedtime. 90 tablet 1  . propranolol (INDERAL) 10 MG tablet TAKE 1 TABLET TWICE DAILY 180 tablet 2  . sertraline (ZOLOFT) 25 MG tablet Take 1 tablet (25 mg total) by mouth daily. 30 tablet 11  . [name] 8 MG TB24 tablet TAKE 1 TABLET EVERY DAY 90 tablet 1  . meclizine (ANTIVERT) 12.5 MG tablet Take 1 tablet (12.5 mg total) by mouth 3 (three) times daily as needed for dizziness. (Patient not taking: Reported on 08/01/2017) 60 tablet 1  . cyanocobalamin (V-R VITAMIN B-12) 500 MCG tablet Take 1 tablet (500 mcg total) by mouth daily.    . polyethylene glycol powder (GLYCOLAX/MIRALAX) powder Take 8.5-17 g by mouth daily. 850 g 3   No facility-administered medications prior to visit.      Per HPI unless specifically indicated in ROS section below Review of Systems     Objective:    BP 132/76 (BP Location: Left Arm, Patient Position: Sitting, Cuff Size: Normal)   Pulse 81   Temp 97.9 F (36.6 C) (Oral)   Wt 148 lb (67.1 kg)   SpO2 97%   BMI 27.96 kg/m   Wt Readings from Last 3 Encounters:  08/01/17 148 lb (67.1 kg)  05/16/17 143 lb (64.9 kg)  03/21/17 136 lb (61.7 kg)    Physical Exam  Constitutional: She appears well-developed and well-nourished. No distress.  HENT:  Mouth/Throat: Oropharynx is clear and moist. No oropharyngeal exudate.  Cardiovascular: Normal rate, regular rhythm, normal heart sounds and intact distal pulses.  No murmur heard. Pulmonary/Chest: Effort normal and breath sounds normal. No respiratory distress. She has no wheezes. She has no rales.  Musculoskeletal:  polydactyly  Neurological: Gait abnormal.  Walks with cane Wide based ataxic gait  Nursing note and vitals reviewed.  Results  for orders placed or performed in visit on 05/18/17  Comprehensive metabolic panel  Result Value Ref Range   Sodium 134 (L) 135 - 145 mEq/L   Potassium 4.6 3.5 - 5.1 mEq/L   Chloride 99 96 - 112 mEq/L   CO2 31 19 - 32 mEq/L   Glucose, Bld 107 (H) 70 - 99 mg/dL   BUN 9 6 - 23 mg/dL   Creatinine, Ser 1.610.62 0.40 - 1.20 mg/dL   Total Bilirubin 0.5 0.2 - 1.2 mg/dL   Alkaline Phosphatase 69 39 - 117 U/L   AST 15 0 - 37 U/L   ALT 19 0 - 35 U/L   Total Protein 7.4 6.0 - 8.3 g/dL   Albumin 4.3 3.5 - 5.2 g/dL   Calcium 9.6 8.4 - 09.610.5 mg/dL   GFR 045.40100.54 >98.11>60.00 mL/min  CBC with Differential/Platelet  Result Value Ref Range   WBC 5.7 4.0 - 10.5 K/uL   RBC 4.61 3.87 - 5.11 Mil/uL   Hemoglobin 13.4 12.0 - 15.0 g/dL   HCT 91.440.1 78.236.0 - 95.646.0 %   MCV 87.0 78.0 - 100.0 fl   MCHC 33.4 30.0 - 36.0 g/dL   RDW 21.312.9 08.611.5 - 57.815.5 %   Platelets 276.0 150.0 - 400.0 K/uL   Neutrophils Relative % 48.3 43.0 - 77.0 %   Lymphocytes Relative 33.8 12.0 - 46.0 %   Monocytes Relative 12.0 3.0 - 12.0 %   Eosinophils Relative 5.1 (H) 0.0 - 5.0 %   Basophils Relative 0.8 0.0 - 3.0 %   Neutro Abs 2.7 1.4 - 7.7 K/uL   Lymphs Abs 1.9 0.7 - 4.0 K/uL   Monocytes Absolute 0.7 0.1 - 1.0 K/uL   Eosinophils Absolute 0.3 0.0 - 0.7 K/uL   Basophils Absolute 0.0 0.0 - 0.1 K/uL   Lab Results  Component Value Date   HGBA1C 6.3 02/01/2017       Assessment & Plan:   Problem List Items Addressed This Visit    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HCC)   Fall at home, initial encounter    Fall in bathroom 2 wks ago without significant injury.  She is high risk for falls due to ataxia/imbalance. [facility] for fall prevention program, home safety evaluation. Pt/husband agree.       Relevant Orders   Ambulatory referral to Home Health   Imbalance    Chronic ataxia. Anticipate multifactorial - vision, hydrocephalus.       Relevant Orders   Ambulatory referral to Home Health   MCI (mild cognitive impairment) with memory  loss    Latest MMSE 28/30 (07/2016). Independent in ADLs, dependent in IADLs.  Husband remains worried about progressive cognitive impairment, seems stable. Will again recommend neurology evaluation.       Overactive bladder    Stable period on [place]toviaz and myrbetriq.       Seizure disorder (HCC) - Primary  Chronic, seems stable on keppra. Husband previously endorsed periods of unresponsiveness without witnessed seizure activity. Pt denies this. Will refer to neurology to re establish          Follow up plan: No Follow-up on file.  Eustaquio Boyden, MD

## 2017-08-01 NOTE — Assessment & Plan Note (Signed)
Fall in bathroom 2 wks ago without significant injury.  She is high risk for falls due to ataxia/imbalance. [facility], home safety evaluation. Pt/husband agree.

## 2017-08-01 NOTE — Assessment & Plan Note (Signed)
Stable period on toviaz and myrbetriq 

## 2017-08-01 NOTE — Patient Instructions (Addendum)
We will refer you to home health physical therapy for fall prevention evaluation. Someone should be in touch in the next week Call neurology at [phone]) to schedule appointment.

## 2017-08-01 NOTE — Assessment & Plan Note (Signed)
Latest MMSE 28/30 (07/2016). Independent in ADLs, dependent in IADLs.  Husband remains worried about progressive cognitive impairment, seems stable. Will again recommend neurology evaluation.

## 2017-08-01 NOTE — Assessment & Plan Note (Signed)
Chronic ataxia. Anticipate multifactorial - vision, hydrocephalus.

## 2017-08-10 ENCOUNTER — Telehealth: Payer: Self-pay | Admitting: Family Medicine

## 2017-08-10 NOTE — Telephone Encounter (Signed)
Copied from CRM [phone]. Topic: Quick Communication - See Telephone Encounter >> Aug 10, 2017  2:27 PM Arlyss Gandyichardson, [place] Miyoshi N, NT wrote: CRM for notification. See Telephone encounter for: Kennyth ArnoldStacy, PT with [facility] calling to let Dr. Sharen HonesGutierrez know that she has been trying to go out an see this pt everyday and everyday the husband has cancelled and now states he would like for them to try next week. Kennyth ArnoldStacy said they are behind on services due to the husband to continue to cancel. CB#: [phone]  08/10/17.

## 2017-08-10 NOTE — Telephone Encounter (Signed)
noted 

## 2017-08-14 ENCOUNTER — Telehealth: Payer: Self-pay | Admitting: Family Medicine

## 2017-08-14 NOTE — Telephone Encounter (Signed)
Copied from CRM [phone]. Topic: Quick Communication - See Telephone Encounter >> Aug 14, 2017 12:44 PM Terisa Starraylor, Brittany L wrote: CRM for notification. See Telephone encounter for:   08/14/17.  [facility] called and stated they received physical therapy orders that came in on 1/9, they said they can not get an appt set up with the patient bc they keep refusing the services.   Call back is [phone]

## 2017-08-16 NOTE — Telephone Encounter (Signed)
plz call pt/husband - [facility] strongly [facility] to help prevent falls.

## 2017-08-17 NOTE — Telephone Encounter (Signed)
Lm on pts vm requesting a call back from husband

## 2017-08-22 NOTE — Telephone Encounter (Signed)
Spoke with pt's husband, Zollie BeckersWalter (on dpr) asking about HH PT eval.  Says each time [facility] wanted to come out they [pt & husband] had something going on and it just was not a good time.  Says they rather wait until the weather is warmer and nicer before setting it up.

## 2017-08-22 NOTE — Telephone Encounter (Signed)
plz call [facility] and cancel referral at this time per pt request.

## 2017-08-23 ENCOUNTER — Other Ambulatory Visit: Payer: Self-pay | Admitting: Family Medicine

## 2017-08-23 NOTE — Telephone Encounter (Signed)
[facility] message per Dr. Reece AgarG.  She says ok.

## 2017-09-05 ENCOUNTER — Other Ambulatory Visit: Payer: Self-pay | Admitting: Family Medicine

## 2017-09-11 ENCOUNTER — Telehealth: Payer: Self-pay

## 2017-09-11 DIAGNOSIS — R404 Transient alteration of awareness: Secondary | ICD-10-CM | POA: Diagnosis not present

## 2017-09-11 DIAGNOSIS — R42 Dizziness and giddiness: Secondary | ICD-10-CM | POA: Diagnosis not present

## 2017-09-11 NOTE — Telephone Encounter (Signed)
Pt is in the car but did not come in; Mr Wyline MoodOrrell said around 3 PM 09/11/2017 pt was getting up off bed and got dizzy and fell to the floor; EMTs were called and Mr Wyline MoodOrrell said EMTs told him that BP was OK (did not know value of BP) and EKG was OK. EMT advised Mr Wyline MoodOrrell to get pt seen by PCP. Pt has dementia but Mr Wyline MoodOrrell said pt is slightly dizzy now; no CP, SOB,H/A,or vision changes. Mr Wyline MoodOrrell said due to dementia not sure how accurate this info is. Spoke with Dr Reece AgarG and pt scheduled for 30' appt on 09/12/17 at 9:15 with Dr Ermalene SearingBedsole (Mr Wyline MoodOrrell [facility] and request appt around that time). Advised Mr Wyline MoodOrrell to advise pt to get up slowly when sitting or laying and if any change in condition prior to appt pt should go directly to ED. FYI to Dr Reece AgarG and Dr Ermalene SearingBedsole.

## 2017-09-12 ENCOUNTER — Ambulatory Visit (INDEPENDENT_AMBULATORY_CARE_PROVIDER_SITE_OTHER): Payer: Medicare HMO | Admitting: Family Medicine

## 2017-09-12 ENCOUNTER — Encounter: Payer: Self-pay | Admitting: Family Medicine

## 2017-09-12 VITALS — BP 152/108 | HR 74 | Temp 98.2°F | Ht 61.0 in | Wt 151.0 lb

## 2017-09-12 DIAGNOSIS — Q039 Congenital hydrocephalus, unspecified: Secondary | ICD-10-CM | POA: Diagnosis not present

## 2017-09-12 DIAGNOSIS — R2689 Other abnormalities of gait and mobility: Secondary | ICD-10-CM

## 2017-09-12 DIAGNOSIS — W19XXXA Unspecified fall, initial encounter: Secondary | ICD-10-CM | POA: Diagnosis not present

## 2017-09-12 DIAGNOSIS — R42 Dizziness and giddiness: Secondary | ICD-10-CM | POA: Diagnosis not present

## 2017-09-12 DIAGNOSIS — R270 Ataxia, unspecified: Secondary | ICD-10-CM | POA: Diagnosis not present

## 2017-09-12 DIAGNOSIS — Y92009 Unspecified place in unspecified non-institutional (private) residence as the place of occurrence of the external cause: Secondary | ICD-10-CM | POA: Diagnosis not present

## 2017-09-12 DIAGNOSIS — I10 Essential (primary) hypertension: Secondary | ICD-10-CM

## 2017-09-12 DIAGNOSIS — H539 Unspecified visual disturbance: Secondary | ICD-10-CM

## 2017-09-12 DIAGNOSIS — R410 Disorientation, unspecified: Secondary | ICD-10-CM | POA: Diagnosis not present

## 2017-09-12 LAB — CBC WITH DIFFERENTIAL/PLATELET
Basophils Absolute: 0.1 10*3/uL (ref 0.0–0.1)
Basophils Relative: 0.7 % (ref 0.0–3.0)
Eosinophils Absolute: 0.1 10*3/uL (ref 0.0–0.7)
Eosinophils Relative: 1.7 % (ref 0.0–5.0)
HCT: 38.8 % (ref 36.0–46.0)
Hemoglobin: 13 g/dL (ref 12.0–15.0)
Lymphocytes Relative: 28.9 % (ref 12.0–46.0)
Lymphs Abs: 2.2 10*3/uL (ref 0.7–4.0)
MCHC: 33.5 g/dL (ref 30.0–36.0)
MCV: 85.6 fl (ref 78.0–100.0)
Monocytes Absolute: 0.8 10*3/uL (ref 0.1–1.0)
Monocytes Relative: 10.1 % (ref 3.0–12.0)
Neutro Abs: 4.4 10*3/uL (ref 1.4–7.7)
Neutrophils Relative %: 58.6 % (ref 43.0–77.0)
Platelets: 270 10*3/uL (ref 150.0–400.0)
RBC: 4.53 Mil/uL (ref 3.87–5.11)
RDW: 12.9 % (ref 11.5–15.5)
WBC: 7.5 10*3/uL (ref 4.0–10.5)

## 2017-09-12 LAB — COMPREHENSIVE METABOLIC PANEL
ALT: 17 U/L (ref 0–35)
AST: 15 U/L (ref 0–37)
Albumin: 4.1 g/dL (ref 3.5–5.2)
Alkaline Phosphatase: 82 U/L (ref 39–117)
BUN: 7 mg/dL (ref 6–23)
CO2: 30 mEq/L (ref 19–32)
Calcium: 9.7 mg/dL (ref 8.4–10.5)
Chloride: 99 mEq/L (ref 96–112)
Creatinine, Ser: 0.63 mg/dL (ref 0.40–1.20)
GFR: 98.61 mL/min (ref >60.00)
Glucose, Bld: 105 mg/dL — ABNORMAL HIGH (ref 70–99)
Potassium: 4.2 mEq/L (ref 3.5–5.1)
Sodium: 133 mEq/L — ABNORMAL LOW (ref 135–145)
Total Bilirubin: 0.4 mg/dL (ref 0.2–1.2)
Total Protein: 7.6 g/dL (ref 6.0–8.3)

## 2017-09-12 NOTE — Assessment & Plan Note (Signed)
Hx of similar in past.. Sound most like vertigo but given other progressive symptoms such as vision changes, memory issues and worsening balance.. Recommend eval with MRI brain  Given hx of congenital hydrocephalus.. To rule out increased pressure on brain, new CVA etc.  pt has appt upcoming with neuro.

## 2017-09-12 NOTE — Assessment & Plan Note (Signed)
Unclear cause of increase in blood pressure. eval with labs.  Increase propranolol to 20 mg twice daily. Follow Bp and pulse at home.

## 2017-09-12 NOTE — Progress Notes (Signed)
Subjective:    Patient ID: Leah Payne, female    DOB: 06-17-1946, 72 y.o.   MRN: [id number]  HPI    72 year old female pt of Dr.G's with history of congenital hydrocephalus, HTN, seizure disorder, DM and WPW syndrome presents  Following fall due to chronic dizziness (felt in past to be due to hydrocephalus).   She was last seen by her PCP on 08/01/2017 and reported dizziness, chronic ataxia and imbalnace and a fall at that time.  She was referred to neurology at that time given husband's report of progressive decline.  Has neuro eval next week. Seizure disorderstable on keppra.  Today she and her husband report dizziness had improved in last few months ( meclizine was discontinued given she was doing better).. Returned 2 days ago.  She noted room spinning and imbalance.. She fell off the bed. When sitting up from the bed. Did not hit head. No LOC. No seizure activity.  Landed on buttocks.  No pain in hips or bruising. Her husband comment she has progressive worsening of memory. Not sure of day of week, short term memory, repeating herself. He feels she sees things of opposite sides.. Looks to right when told to look left. Has to spin around until she sees things.  No new numbness or weakness.  no headache.  Husband feels like vision is sometimes worse than others.. Recent glasses update.  Family history in father of alzheimer's Her BP is elevated. BP Readings from Last 3 Encounters:  09/12/17 (!) 152/108  08/01/17 132/76  05/16/17 120/84   Orthostatic nml today.. But all blood pressures high today.  Blood pressure (!) 152/108, pulse 74, temperature 98.2 F (36.8 C), temperature source Oral, height 5\' 1"  (1.549 m), weight 151 lb (68.5 kg), SpO2 98 %.   Review of Systems  Constitutional: Negative for fatigue and fever.  HENT: Negative for congestion.   Eyes: Negative for pain.  Respiratory: Negative for cough and shortness of breath.   Cardiovascular: Negative for chest  pain, palpitations and leg swelling.  Gastrointestinal: Negative for abdominal pain.  Genitourinary: Negative for dysuria and vaginal bleeding.  Musculoskeletal: Negative for back pain.  Neurological: Positive for dizziness. Negative for syncope, light-headedness and headaches.       Describes as vertigo  Psychiatric/Behavioral: Negative for dysphoric mood.       Objective:   Physical Exam  Constitutional: She is oriented to person, place, and time. Vital signs are normal. She appears well-developed and well-nourished. She is cooperative.  Non-toxic appearance. She does not appear ill. No distress.  HENT:  Head: Macrocephalic.  Right Ear: Hearing, tympanic membrane, external ear and ear canal normal. Tympanic membrane is not erythematous, not retracted and not bulging.  Left Ear: Hearing, tympanic membrane, external ear and ear canal normal. Tympanic membrane is not erythematous, not retracted and not bulging.  Nose: No mucosal edema or rhinorrhea. Right sinus exhibits no maxillary sinus tenderness and no frontal sinus tenderness. Left sinus exhibits no maxillary sinus tenderness and no frontal sinus tenderness.  Mouth/Throat: Uvula is midline, oropharynx is clear and moist and mucous membranes are normal. No oropharyngeal exudate.  Eyes: Conjunctivae, EOM and lids are normal. Pupils are equal, round, and reactive to light. Lids are everted and swept, no foreign bodies found.  Neck: Trachea normal and normal range of motion. Neck supple. Carotid bruit is not present. No thyroid mass and no thyromegaly present.  Cardiovascular: Normal rate, regular rhythm, S1 normal, S2 normal, normal heart sounds,  intact distal pulses and normal pulses. Exam reveals no gallop and no friction rub.  No murmur heard. Pulmonary/Chest: Effort normal and breath sounds normal. No tachypnea. No respiratory distress. She has no decreased breath sounds. She has no wheezes. She has no rhonchi. She has no rales.    Abdominal: Soft. Normal appearance and bowel sounds are normal. There is no tenderness.  Musculoskeletal:  polydactyly  Neurological: She is alert and oriented to person, place, and time. She has normal strength and normal reflexes. No cranial nerve deficit or sensory deficit. She exhibits normal muscle tone. She displays a negative Romberg sign. Gait abnormal. Coordination normal. GCS eye subscore is 4. GCS verbal subscore is 5. GCS motor subscore is 6.  Wide based ataxic gait  Skin: Skin is warm, dry and intact. No rash noted.  Psychiatric: She has a normal mood and affect. Her speech is normal and behavior is normal. Judgment and thought content normal. Her mood appears not anxious. Cognition and memory are normal. Cognition and memory are not impaired. She does not exhibit a depressed mood. She exhibits normal recent memory and normal remote memory.  Nursing note and vitals reviewed.         Assessment & Plan:

## 2017-09-12 NOTE — Patient Instructions (Addendum)
Increase propranolol to 2 tabs of 10 mg twice daily.  Please stop at the lab to have labs drawn.  Get BP cuff  To follow BP at home. Please stop at the front desk to set up referral. Keep appt  with neurology next week.

## 2017-09-13 ENCOUNTER — Ambulatory Visit (HOSPITAL_COMMUNITY): Admission: RE | Admit: 2017-09-13 | Payer: Medicare HMO | Source: Ambulatory Visit

## 2017-09-16 ENCOUNTER — Ambulatory Visit (HOSPITAL_COMMUNITY): Admission: RE | Admit: 2017-09-16 | Payer: Medicare HMO | Source: Ambulatory Visit

## 2017-09-18 ENCOUNTER — Other Ambulatory Visit: Payer: Self-pay | Admitting: Family Medicine

## 2017-09-19 ENCOUNTER — Ambulatory Visit (INDEPENDENT_AMBULATORY_CARE_PROVIDER_SITE_OTHER): Payer: Medicare HMO | Admitting: Neurology

## 2017-09-19 ENCOUNTER — Telehealth: Payer: Self-pay | Admitting: Neurology

## 2017-09-19 ENCOUNTER — Encounter: Payer: Self-pay | Admitting: Neurology

## 2017-09-19 VITALS — BP 158/89 | HR 76 | Ht 61.0 in | Wt 148.0 lb

## 2017-09-19 DIAGNOSIS — R269 Unspecified abnormalities of gait and mobility: Secondary | ICD-10-CM

## 2017-09-19 DIAGNOSIS — F0390 Unspecified dementia without behavioral disturbance: Secondary | ICD-10-CM

## 2017-09-19 DIAGNOSIS — E538 Deficiency of other specified B group vitamins: Secondary | ICD-10-CM

## 2017-09-19 MED ORDER — DONEPEZIL HCL 10 MG PO TABS: ORAL_TABLET | ORAL | 3 refills | Status: DC

## 2017-09-19 NOTE — Patient Instructions (Addendum)
Aricept: Take 1/2 pill daily for one week then increase to a whole pill daily Physical Therapy Formal neurocognitive testing: Dr. [facility] today for B12 deficiency Continue Keppra for seizure history   Alzheimer Disease Alzheimer disease is a brain disease that affects memory, thinking, and behavior. People with Alzheimer disease lose mental abilities, and the disease gets worse over time. Survival with Alzheimer disease ranges from several years to as long as 20 years. What are the causes? This condition develops when a protein called beta-amyloid forms deposits in the brain. It is not known what causes these deposits to form. What increases the risk? This condition is more likely to develop in people who:  Are elderly.  Have a family history of dementia.  Have had a brain injury.  Have heart or blood vessel disease.  Have had a stroke.  Have high blood pressure or high cholesterol.  Have diabetes.  What are the signs or symptoms? Symptoms of this condition happen in three stages, which often overlap. Early stage In this stage, you may continue to be independent. You may still be able to drive, work, and be social. Symptoms in this stage include:  Minor memory problems, such as forgetting a name or what you read.  Difficulty with: ? Paying attention. ? Communicating. ? Doing familiar tasks. ? Learning new things.  Needing more time to do daily activities.  Anxiety.  Social withdrawal.  Loss of motivation.  Moderate stage In this stage, you will start to need care. This stage usually lasts the longest. Symptoms in this stage include:  Difficulty with expressing thoughts.  Memory loss that affects daily life. This can include forgetting: ? Your address or phone number. ? Events that have happened. ? Parts of your personal history, like where you went to school.  Confusion about where you are or what time it is.  Difficulty in judging distance.  Changes  in personality, mood, and behavior. You may be moody, irritable, angry, frustrated, fearful, anxious, or suspicious.  Poor reasoning and judgment.  Delusions or hallucinations.  Changes in sleep patterns.  Wandering and getting lost.  Severe stage In the final stage, you will need help with your personal care and dailyactivities. Symptoms in this stage include:  Worsening memory loss.  Personality changes.  Loss of awareness of your surroundings.  Changes in physical abilities, including the ability to walk, sit, and swallow.  Difficulty in communicating.  Inability to control the bladder and bowels.  Increasing confusion.  Increasing disruptive behavior.  How is this diagnosed? This condition is diagnosed with an assessment by your health care provider. During this assessment, your health care provider will talk with you and your family, friends, or caregivers about your symptoms. A thorough medical history will be taken, and you will have a physical exam and tests. Tests may include:  Lab tests, such as blood or urine tests.  Imaging tests, such as a CT scan, PET scan, or MRI.  A lumbar puncture. This test involves removing and testing a small amount of the fluid that surrounds the brain and spinal cord.  An electroencephalogram (EEG). In this test, small metal discs are used to measure electrical activity in the brain.  Memory tests, cognitive tests, and neuropsychological tests. These tests evaluate brain function.  How is this treated? At this time, there is no treatment to cure Alzheimer disease or stop it from getting worse. The goals of treatment are:  To slow down the disease.  To manage behavioral  problems.  To provide you with a safe environment.  To make life easier for you and your caregivers.  The following treatment options are available:  Medicines. Medicines may help to slow down memory loss and control behavioral symptoms.  Talk therapy.  Talk therapy provides you with education, support, and memory aids. It is most helpful in the early stages of the condition.  Counseling or spiritual guidance. It is normal to have a lot of feelings, including anger, relief, fear, and isolation. Counseling and guidance can help you deal with these feelings.  Caregiving. This involves having caregivers help you with your daily activities. Caregivers may be family members, friends, or trained medical professionals. Caregiving can be done at home or outside the home.  Family support groups. These provide education, emotional support, and information about community resources to family members who are taking care of you.  Follow these instructions at home: Medicines  Take over-the-counter and prescription medicines only as told by your health care provider.  Avoid taking medicines that can affect thinking, such as pain or sleeping medicines. Lifestyle   Make healthy lifestyle choices: ? Be physically active as told by your health care provider. ? Do not use any tobacco products, such as cigarettes, chewing tobacco, and e-cigarettes. If you need help quitting, ask your health care provider. ? Eat a healthy diet. ? Practice stress-management techniques when you get stressed. ? Stay social.  Drink enough fluid to keep your urine clear or pale yellow.  Make sure to get quality sleep. These tips can help you get a good night's rest: ? Avoid napping during the day. ? Keep your sleeping area dark and cool. ? Avoid exercising during the few hours before you go to bed. ? Avoid caffeine products in the evening. General instructions  Work with your health care provider to determine what you need help with and what your safety needs are.  If you were given a bracelet that tracks your location, make sure to wear it.  Keep all follow-up visits as told by your health care provider. This is important.  If you have questions or would like  additional support, you may contact The Alzheimer's Association: ? 24-hour helpline: 815-139-2430 ? Website: LimitLaws.hu Contact a health care provider if:  You have nausea, vomiting, or trouble with eating.  You have dizziness, or weakness.  You have new or worsening trouble with sleeping.  You or your family members become concerned for your safety. Get help right away if:  You develop chest pain or difficulty with breathing.  You pass out. This information is not intended to replace advice given to you by your health care provider. Make sure you discuss any questions you have with your health care provider. Document Released: 03/22/2004 Document Revised: 03/11/2016 Document Reviewed: 04/08/2015 Elsevier Interactive Patient Education  2018 [employer].   Donepezil tablets What is this medicine? DONEPEZIL (doe NEP e zil) is used to treat mild to moderate dementia caused by Alzheimer's disease. This medicine may be used for other purposes; ask your health care provider or pharmacist if you have questions. COMMON BRAND NAME(S): Aricept What should I tell my health care provider before I take this medicine? They need to know if you have any of these conditions: -asthma or other lung disease -difficulty passing urine -head injury -heart disease -history of irregular heartbeat -liver disease -seizures (convulsions) -stomach or intestinal disease, ulcers or stomach bleeding -an unusual or allergic reaction to donepezil, other medicines, foods, dyes, or preservatives -  pregnant or trying to get pregnant -breast-feeding How should I use this medicine? Take this medicine by mouth with a glass of water. Follow the directions on the prescription label. You may take this medicine with or without food. Take this medicine at regular intervals. This medicine is usually taken before bedtime. Do not take it more often than directed. Continue to take your medicine even if you feel better.  Do not stop taking except on your doctor's advice. If you are taking the 23 mg donepezil tablet, swallow it whole; do not cut, crush, or chew it. Talk to your pediatrician regarding the use of this medicine in children. Special care may be needed. Overdosage: If you think you have taken too much of this medicine contact a poison control center or emergency room at once. NOTE: This medicine is only for you. Do not share this medicine with others. What if I miss a dose? If you miss a dose, take it as soon as you can. If it is almost time for your next dose, take only that dose, do not take double or extra doses. What may interact with this medicine? Do not take this medicine with any of the following medications: -certain medicines for fungal infections like itraconazole, fluconazole, posaconazole, and voriconazole -cisapride -dextromethorphan; quinidine -dofetilide -dronedarone -pimozide -quinidine -thioridazine -ziprasidone This medicine may also interact with the following medications: -antihistamines for allergy, cough and cold -atropine -bethanechol -carbamazepine -certain medicines for bladder problems like oxybutynin, tolterodine -certain medicines for Parkinson's disease like benztropine, trihexyphenidyl -certain medicines for stomach problems like dicyclomine, hyoscyamine -certain medicines for travel sickness like scopolamine -dexamethasone -ipratropium -NSAIDs, medicines for pain and inflammation, like ibuprofen or naproxen -other medicines for Alzheimer's disease -other medicines that prolong the QT interval (cause an abnormal heart rhythm) -phenobarbital -phenytoin -rifampin, rifabutin or rifapentine This list may not describe all possible interactions. Give your health care provider a list of all the medicines, herbs, non-prescription drugs, or dietary supplements you use. Also tell them if you smoke, drink alcohol, or use illegal drugs. Some items may interact with  your medicine. What should I watch for while using this medicine? Visit your doctor or health care professional for regular checks on your progress. Check with your doctor or health care professional if your symptoms do not get better or if they get worse. You may get drowsy or dizzy. Do not drive, use machinery, or do anything that needs mental alertness until you know how this drug affects you. What side effects may I notice from receiving this medicine? Side effects that you should report to your doctor or health care professional as soon as possible: -allergic reactions like skin rash, itching or hives, swelling of the face, lips, or tongue -feeling faint or lightheaded, falls -loss of bladder control -seizures -signs and symptoms of a dangerous change in heartbeat or heart rhythm like chest pain; dizziness; fast or irregular heartbeat; palpitations; feeling faint or lightheaded, falls; breathing problems -signs and symptoms of infection like fever or chills; cough; sore throat; pain or trouble passing urine -signs and symptoms of liver injury like dark yellow or brown urine; general ill feeling or flu-like symptoms; light-colored stools; loss of appetite; nausea; right upper belly pain; unusually weak or tired; yellowing of the eyes or skin -slow heartbeat or palpitations -unusual bleeding or bruising -vomiting Side effects that usually do not require medical attention (report to your doctor or health care professional if they continue or are bothersome): -diarrhea, especially when starting treatment -headache -loss  of appetite -muscle cramps -nausea -stomach upset This list may not describe all possible side effects. Call your doctor for medical advice about side effects. You may report side effects to FDA at 1-800-FDA-1088. Where should I keep my medicine? Keep out of reach of children. Store at room temperature between 15 and 30 degrees C (59 and 86 degrees F). Throw away any unused  medicine after the expiration date. NOTE: This sheet is a summary. It may not cover all possible information. If you have questions about this medicine, talk to your doctor, pharmacist, or health care provider.  2018 Elsevier/Gold Standard (2015-12-28 21:00:42)

## 2017-09-19 NOTE — Progress Notes (Signed)
[facility]    Provider:  Dr Lucia Gaskins Referring Provider: Eustaquio Boyden, MD Primary Care Physician:  Eustaquio Boyden, MD  CC:  Memory loss  HPI:  Leah Payne is a 72 y.o. female here as a referral from Dr. Sharen Hones for memory problems. PMHx seizure, hydrocephalus and fainting, DM2.  She had a possible seizure in 2015 but no showed her neurology appointment with Dr. Karel Jarvis. She has an MRI scheduled. She had a possible seizure in 2015 and has stayed on the Keppra.No more episodes since then. She has chronic dizziness. She is with her husband who provides most information. It is more her short-term memory. She can't remember immediate things. Started about 4 years ago after the seizure. Husband feels as this is progressive. She denies anything is wrong. Memory is worsening. Knows the year. She remembers birthdays. She forget events at church. Married for 55 years. Husband has always been responsible for bills. She has never driven. Husband goes to the grocery store, he started going for her in the 90s he said she couldn't do it, husband does the cooking. Her father had Alzheimer's disease. Around 69 or 70. More short-term memory loss. She does not cook anymore, husband is concerned if she remembers. She can heat up coffee in the microwave. She repeats herself and asks the same questions in the same day. She acts normal with other people. Having difficulty walking. Can't pick feet up. No tremors. No hallucinations or delusions. No alcohol. She likes to go out, she is still social. No mood changes but she does worry a lot. Progressive. Patient denies any problems. Husband has to manage her medications.  Reviewed notes, labs and imaging from outside physicians, which showed:  B12, hemoglobin A1c, normal in July 2018, TSH normal in February 2018.  Reviewed emergency room notes in 2015.  Suspected new onset seizures.  They were at church, the husband was behind the pulpit  performing a reading while is why set him up use, noticed her arm starting to [place] and she subsequently passed out.  She was somewhat difficult to arouse and appeared confused.  In the emergency department and nurse witnessed another similar episode. She had no previous seizure history. She was continued on Keppra, EEG and MRI ordered.  EEG was nonspecific but did not show evidence of an epileptic disorder.  CT scan of the head showed marketed chronic ventriculomegaly with prominent lateral and third ventricles and chronic compression of cerebral tissue along its periphery.  Fourth ventricle is normal in size.  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  Review of Systems: Patient complains of symptoms per HPI as well as the following symptoms: Memory loss, confusion, sleepiness, weakness, dizziness, seizure, too much sleep, feeling cold, increased thirst, spinning sensation, fatigue. Pertinent negatives and positives per HPI. All others negative.   Social History   Socioeconomic History  . Marital status: Married    Spouse name: Not on file  . Number of children: Not on file  . Years of education: Not on file  . Highest education level: Not on file  Social Needs  . Financial resource strain: Not on file  . Food insecurity - worry: Not on file  . Food insecurity - inability: Not on file  . Transportation needs - medical: Not on file  . Transportation needs - non-medical: Not on file  Occupational History  . Not on file  Tobacco Use  . Smoking status: Never Smoker  . Smokeless tobacco:  Never Used  Substance and Sexual Activity  . Alcohol use: No    Alcohol/week: 0.0 oz  . Drug use: No  . Sexual activity: Not on file  Other Topics Concern  . Not on file  Social History Narrative   Lives with husband, 2 dogs   Moved from [facility] 2009   Occupation: retired, was housewife   Edu: completed HS GED   Activity: no regular exercise. Dizziness precludes  activity   Diet: good water, fruits/vegetables daily    Family History  Problem Relation Age of Onset  . Stroke Mother 44  . CAD Father 74  . Alzheimer's disease Father   . Cancer Maternal Aunt        lung (non-smoker)  . Diabetes Neg Hx     Past Medical History:  Diagnosis Date  . Anomaly of cranium    frontal bossing, enlarged cranium, adult strabismus  . Diabetes mellitus type 2, controlled (HCC) 08/04/2013  . History of chicken pox   . History of fainting   . 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 Hyperlipidemia 08/04/2013  . Hyponatremia 08/29/2014  . Preaxial polydactyly of right hand congenital  . Seizure disorder (HCC) 08/04/2013  . Tachycardia   . WPW (Wolff-Parkinson-White syndrome)    has seen Dr Donnie Aho    Past Surgical History:  Procedure Laterality Date  . CHOLECYSTECTOMY  1978    Current Outpatient Medications  Medication Sig Dispense Refill  . atorvastatin (LIPITOR) 40 MG tablet TAKE 1 TABLET EVERY EVENING 90 tablet 0  . levETIRAcetam (KEPPRA) 500 MG tablet TAKE 1 TABLET TWICE DAILY 180 tablet 0  . levothyroxine (SYNTHROID, LEVOTHROID) 25 MCG tablet TAKE 1 TABLET DAILY BEFORE BREAKFAST. 90 tablet 1  . metFORMIN (GLUCOPHAGE) 500 MG tablet TAKE 1 TABLET EVERY DAY WITH BREAKFAST 90 tablet 0  . [name] 50 MG TB24 tablet TAKE 1 TABLET AT BEDTIME 90 tablet 0  . propranolol (INDERAL) 10 MG tablet TAKE 1 TABLET TWICE DAILY 180 tablet 0  . sertraline (ZOLOFT) 25 MG tablet TAKE 1 TABLET ONE TIME DAILY 90 tablet 0  . [name] 8 MG TB24 tablet TAKE 1 TABLET EVERY DAY 90 tablet 1  . meclizine (ANTIVERT) 12.5 MG tablet Take 1 tablet (12.5 mg total) by mouth 3 (three) times daily as needed for dizziness. (Patient not taking: Reported on 09/19/2017) 60 tablet 1   No current facility-administered  medications for this visit.     Allergies as of 09/19/2017  . (No Known Allergies)    Vitals: BP (!) 158/89   Pulse 76   Ht 5\' 1"  (1.549 m)   Wt 148 lb (67.1 kg)   BMI 27.96 kg/m  Last Weight:  Wt Readings from Last 1 Encounters:  09/19/17 148 lb (67.1 kg)   Last Height:   Ht Readings from Last 1 Encounters:  09/19/17 5\' 1"  (1.549 m)    Physical exam: Exam: Gen: NAD, conversant, well nourised, obese, well groomed                     CV: RRR, no MRG. No Carotid Bruits. No peripheral edema, warm, nontender Eyes: Conjunctivae clear without exudates or hemorrhage  Neuro: Detailed Neurologic Exam  Speech:    Speech is normal; fluent and spontaneous with normal comprehension.  Cognition:    The patient is oriented to  person, place, and time;     recent and remote memory intact;     language fluent;     normal attention, concentration,     fund of knowledge Cranial Nerves:    The pupils are equal, round, and reactive to light. Attempted fundoscopic exam could not visualize.  Visual fields are full to finger confrontation. Extraocular movements are intact. Trigeminal sensation is intact and the muscles of mastication are normal. The face is symmetric. The palate elevates in the midline. Hearing intact. Voice is normal. Shoulder shrug is normal. The tongue has normal motion without fasciculations.   Coordination:    Normal finger to nose   Gait:    Wide-based, low clearance  Motor Observation:     no involuntary movements noted. Tone:     Increased tone in the lower extremities vs paratonia  Posture:    Slightly stooped    Strength:    Difficult strength exam due to cognitive difficulties but strength appears antigravity and symmetric     Sensation: intact to LT     Reflex Exam:  DTR's:    Deep tendon reflexes in the upper and lower extremities are brisk bilaterally.   Toes:    Right may be upgoing   Clonus:    Clonus is absent.    Assessment/Plan:  72  year old with cognitive decline FHx of Alzheimer's. 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    Seizures: Continue Keppra  B12: low normal in the past, will recheck.   MRI brain pending  Formal neurocognitive testing: Dr. Alinda DoomsBailar  Start Aricept  Physical therapy: gait abnormality  Orders Placed This Encounter  Procedures  . B12 and Folate Panel  . Methylmalonic acid, serum  . Ambulatory referral to Physical Therapy  . Ambulatory referral to Neuropsychology     Naomie DeanAntonia Ahern, MD  [facility] [address] [place], [place] [id number]  Phone [phone] Fax [phone]

## 2017-09-19 NOTE — Telephone Encounter (Signed)
Mri brain  

## 2017-09-20 ENCOUNTER — Telehealth: Payer: Self-pay | Admitting: *Deleted

## 2017-09-20 ENCOUNTER — Encounter: Payer: Self-pay | Admitting: Psychology

## 2017-09-20 ENCOUNTER — Ambulatory Visit (HOSPITAL_COMMUNITY)
Admission: RE | Admit: 2017-09-20 | Discharge: 2017-09-20 | Disposition: A | Discharge status: Home / Self Care | Payer: Medicare HMO | Source: Ambulatory Visit | Attending: Family Medicine | Admitting: Family Medicine

## 2017-09-20 DIAGNOSIS — R2689 Other abnormalities of gait and mobility: Secondary | ICD-10-CM

## 2017-09-20 DIAGNOSIS — R270 Ataxia, unspecified: Secondary | ICD-10-CM | POA: Diagnosis not present

## 2017-09-20 DIAGNOSIS — R413 Other amnesia: Secondary | ICD-10-CM | POA: Diagnosis not present

## 2017-09-20 DIAGNOSIS — Y92009 Unspecified place in unspecified non-institutional (private) residence as the place of occurrence of the external cause: Secondary | ICD-10-CM

## 2017-09-20 DIAGNOSIS — R410 Disorientation, unspecified: Secondary | ICD-10-CM

## 2017-09-20 DIAGNOSIS — R296 Repeated falls: Secondary | ICD-10-CM | POA: Insufficient documentation

## 2017-09-20 DIAGNOSIS — Q039 Congenital hydrocephalus, unspecified: Secondary | ICD-10-CM | POA: Insufficient documentation

## 2017-09-20 DIAGNOSIS — G9389 Other specified disorders of brain: Secondary | ICD-10-CM | POA: Diagnosis not present

## 2017-09-20 DIAGNOSIS — W19XXXA Unspecified fall, initial encounter: Secondary | ICD-10-CM

## 2017-09-20 DIAGNOSIS — H539 Unspecified visual disturbance: Secondary | ICD-10-CM | POA: Diagnosis not present

## 2017-09-20 MED ORDER — GADOBENATE DIMEGLUMINE 529 MG/ML IV SOLN
15.0000 mL | Freq: Once | INTRAVENOUS | Status: AC | PRN
  Administered 2017-09-20: 14 mL via INTRAVENOUS

## 2017-09-20 NOTE — Telephone Encounter (Signed)
-----   Message from Anson FretAntonia B Ahern, MD sent at 09/20/2017  8:24 AM EST ----- B12 normal thanks

## 2017-09-20 NOTE — Telephone Encounter (Signed)
Leah Payne, there has been no changes to her MRI. The findings are stable. Nothing acute. She has chronic hydrocephalus but this is congenital. We did not order the MRI so she should get a call from the ordering physician but at their appointment this week they asked me to review the results as well. thanks

## 2017-09-20 NOTE — Telephone Encounter (Signed)
Called patient and LVM (ok per DPR) informing patient that her Vitamin B12 level is normal. Call back not required but encouraged pt to call with any questions. Left office number in message.

## 2017-09-21 LAB — POCT I-STAT CREATININE: Creatinine, Ser: 0.6 mg/dL (ref 0.44–1.00)

## 2017-09-22 ENCOUNTER — Other Ambulatory Visit: Payer: Self-pay | Admitting: Neurology

## 2017-09-22 LAB — B12 AND FOLATE PANEL
Folate: 9.7 ng/mL (ref >3.0)
Vitamin B-12: 637 pg/mL (ref 232–1245)

## 2017-09-22 LAB — METHYLMALONIC ACID, SERUM: Methylmalonic Acid: 113 nmol/L (ref 0–378)

## 2017-09-22 NOTE — Telephone Encounter (Signed)
Did not see where pt had been called about MRI.   Called pt & LVM (ok per DPR) informing that Dr. Lucia GaskinsAhern reviewed her MRI brain results and has stated that the there has been no changes to her MRI. The findings are stable. Nothing acute. She has chronic hydrocephalus but this is congenital. Encouraged pt to call with any questions. Left office number & hours in message.

## 2017-09-24 ENCOUNTER — Other Ambulatory Visit: Payer: Self-pay | Admitting: Family Medicine

## 2017-09-24 ENCOUNTER — Encounter: Payer: Self-pay | Admitting: Family Medicine

## 2017-09-24 DIAGNOSIS — E1142 Type 2 diabetes mellitus with diabetic polyneuropathy: Secondary | ICD-10-CM

## 2017-09-24 DIAGNOSIS — E785 Hyperlipidemia, unspecified: Secondary | ICD-10-CM

## 2017-09-24 DIAGNOSIS — E039 Hypothyroidism, unspecified: Secondary | ICD-10-CM

## 2017-09-24 DIAGNOSIS — E559 Vitamin D deficiency, unspecified: Secondary | ICD-10-CM

## 2017-09-29 ENCOUNTER — Telehealth: Payer: Self-pay | Admitting: Family Medicine

## 2017-09-29 ENCOUNTER — Ambulatory Visit (INDEPENDENT_AMBULATORY_CARE_PROVIDER_SITE_OTHER): Payer: Medicare HMO

## 2017-09-29 VITALS — BP 150/84 | HR 73 | Temp 98.7°F | Ht 59.75 in | Wt 146.5 lb

## 2017-09-29 DIAGNOSIS — E039 Hypothyroidism, unspecified: Secondary | ICD-10-CM

## 2017-09-29 DIAGNOSIS — E785 Hyperlipidemia, unspecified: Secondary | ICD-10-CM | POA: Diagnosis not present

## 2017-09-29 DIAGNOSIS — Z0000 Encounter for general adult medical examination without abnormal findings: Secondary | ICD-10-CM

## 2017-09-29 DIAGNOSIS — E559 Vitamin D deficiency, unspecified: Secondary | ICD-10-CM

## 2017-09-29 DIAGNOSIS — E1142 Type 2 diabetes mellitus with diabetic polyneuropathy: Secondary | ICD-10-CM | POA: Diagnosis not present

## 2017-09-29 LAB — LIPID PANEL
Cholesterol: 128 mg/dL (ref 0–200)
HDL: 52.6 mg/dL (ref >39.00)
LDL Cholesterol: 57 mg/dL (ref 0–99)
NonHDL: 74.94
Total CHOL/HDL Ratio: 2
Triglycerides: 89 mg/dL (ref 0.0–149.0)
VLDL: 17.8 mg/dL (ref 0.0–40.0)

## 2017-09-29 LAB — TSH: TSH: 2.49 u[IU]/mL (ref 0.35–4.50)

## 2017-09-29 LAB — VITAMIN D 25 HYDROXY (VIT D DEFICIENCY, FRACTURES): VITD: 17.08 ng/mL — ABNORMAL LOW (ref 30.00–100.00)

## 2017-09-29 LAB — HEMOGLOBIN A1C: Hgb A1c MFr Bld: 6.1 % (ref 4.6–6.5)

## 2017-09-29 MED ORDER — MECLIZINE HCL 12.5 MG PO TABS: 12.5000 mg | ORAL_TABLET | Freq: Three times a day (TID) | ORAL | 1 refills | Status: DC | PRN

## 2017-09-29 NOTE — Telephone Encounter (Signed)
refill sent in

## 2017-09-29 NOTE — Progress Notes (Signed)
Subjective:   Leah Payne is a 72 y.o. female who presents for Medicare Annual (Subsequent) preventive examination.  Review of Systems:  N/A Cardiac Risk Factors include: advanced age (>58men, >8 women);dyslipidemia;hypertension     Objective:     Vitals: BP (!) 150/84 (BP Location: Right Arm, Patient Position: Sitting, Cuff Size: Normal)   Pulse 73   Temp 98.7 F (37.1 C) (Oral)   Ht 4' 11.75" (1.518 m) Comment: shoes  Wt 146 lb 8 oz (66.5 kg)   SpO2 98%   BMI 28.85 kg/m   Body mass index is 28.85 kg/m.  Advanced Directives 09/29/2017 08/15/2016 08/29/2014 08/04/2013  Does Patient Have a Medical Advance Directive? No No No Patient does not have advance directive  Would patient like information on creating a medical advance directive? Yes (MAU/Ambulatory/Procedural Areas - Information given) No - Patient declined - -  Pre-existing out of facility DNR order (yellow form or pink MOST form) - - - No    Tobacco Social History   Tobacco Use  Smoking Status Never Smoker  Smokeless Tobacco Never Used     Counseling given: No   Clinical Intake:  Pre-visit preparation completed: Yes  Pain : No/denies pain Pain Score: 0-No pain     Nutritional Status: BMI 25 -29 Overweight Nutritional Risks: None Diabetes: No  How often do you need to have someone help you when you read instructions, pamphlets, or other written materials from your doctor or pharmacy?: 1 - Never What is the last grade level you completed in school?: 12th grade  Interpreter Needed?: No  Comments: pt lives with spouse Information entered by :: LPinson, LPN  Past Medical History:  Diagnosis Date  . Anomaly of cranium    frontal bossing, enlarged cranium, adult strabismus  . Diabetes mellitus type 2, controlled (HCC) 08/04/2013  . Heart disease   . History of chicken pox   . History of fainting   . 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 Hyperlipidemia 08/04/2013  . Hyponatremia 08/29/2014  . Preaxial polydactyly of right hand congenital  . Seizure disorder (HCC) 08/04/2013  . Tachycardia   . WPW (Wolff-Parkinson-White syndrome)    has seen Dr Donnie Aho   Past Surgical History:  Procedure Laterality Date  . CHOLECYSTECTOMY  1978   Family History  Problem Relation Age of Onset  . Stroke Mother 76  . Pneumonia Mother   . CAD Father 14  . Alzheimer's disease Father   . Pneumonia Father   . Cancer Maternal Aunt        lung (non-smoker)  . Diabetes Neg Hx    Social History   Socioeconomic History  . Marital status: Married    Spouse name: None  . Number of children: None  . Years of education: None  . Highest education level: None  Social Needs  . Financial resource strain: None  . Food insecurity - worry: None  . Food insecurity - inability: None  . Transportation needs - medical: None  . Transportation needs - non-medical: None  Occupational History  . None  Tobacco Use  . Smoking status: Never Smoker  . Smokeless tobacco: Never Used  Substance and Sexual Activity  . Alcohol use: No    Alcohol/week: 0.0 oz  . Drug use: No  . Sexual activity: None  Other Topics Concern  .  None  Social History Narrative   Lives with husband, 2 dogs   Moved from [facility]   Occupation: retired, was housewife   Edu: completed HS    Activity: no regular exercise. Dizziness precludes activity   Diet: good water, fruits/vegetables daily   Caffeine: 2 cups daily    Outpatient Encounter Medications as of 09/29/2017  Medication Sig  . atorvastatin (LIPITOR) 40 MG tablet TAKE 1 TABLET EVERY EVENING  . donepezil (ARICEPT) 10 MG tablet Start with 1/2 pill for one week then increase to a whole pill daily.  Marland Kitchen levETIRAcetam (KEPPRA) 500 MG tablet TAKE 1 TABLET TWICE DAILY  . levothyroxine (SYNTHROID, LEVOTHROID) 25 MCG  tablet TAKE 1 TABLET DAILY BEFORE BREAKFAST.  . metFORMIN (GLUCOPHAGE) 500 MG tablet TAKE 1 TABLET EVERY DAY WITH BREAKFAST  . [name] 50 MG TB24 tablet TAKE 1 TABLET AT BEDTIME  . propranolol (INDERAL) 10 MG tablet TAKE 1 TABLET TWICE DAILY  . sertraline (ZOLOFT) 25 MG tablet TAKE 1 TABLET ONE TIME DAILY  . [name] 8 MG TB24 tablet TAKE 1 TABLET EVERY DAY  . meclizine (ANTIVERT) 12.5 MG tablet Take 1 tablet (12.5 mg total) by mouth 3 (three) times daily as needed for dizziness. (Patient not taking: Reported on 09/29/2017)   No facility-administered encounter medications on file as of 09/29/2017.     Activities of Daily Living In your present state of health, do you have any difficulty performing the following activities: 09/29/2017  Hearing? Y  Vision? Y  Difficulty concentrating or making decisions? Y  Walking or climbing stairs? Y  Dressing or bathing? N  Doing errands, shopping? Y  Preparing Food and eating ? N  Using the Toilet? N  In the past six months, have you accidently leaked urine? N  Do you have problems with loss of bowel control? N  Managing your Medications? Y  Managing your Finances? Y  Housekeeping or managing your Housekeeping? Y  Some recent data might be hidden    Patient Care Team: Eustaquio Boyden, MD as PCP - General (Family Medicine)    Assessment:   This is a routine wellness examination for [facility].   Hearing Screening   125Hz  250Hz  500Hz  1000Hz  2000Hz  3000Hz  4000Hz  6000Hz  8000Hz   Right ear:   0 0 0  40    Left ear:   40 0 40  0      Visual Acuity Screening   Right eye Left eye Both eyes  Without correction:     With correction: 20/100 20/70 20/70   Exercise Activities and Dietary recommendations Current Exercise Habits: The patient does not participate in regular exercise at present, Exercise limited by: orthopedic condition(s)  Goals    . DIET - INCREASE WATER INTAKE     Starting 09/29/2017, I will continue to drink at least 6-8 glasses of  water daily.        Fall Risk Fall Risk  09/29/2017 09/23/2016 12/18/2015  Falls in the past year? Yes Yes No  Comment pt fell in bathroom after losing balance; denies injury - -  Number falls in past yr: 1 1 -  Injury with Fall? No No -  Risk for fall due to : - Impaired balance/gait;Impaired mobility Impaired balance/gait   Depression Screen PHQ 2/9 Scores 09/29/2017 09/23/2016  PHQ - 2 Score 0 0  PHQ- 9 Score 0 -     Cognitive Function MMSE - Mini Mental State Exam 09/29/2017 09/19/2017  Not completed: (No Data) -  Orientation to time -  5  Orientation to Place - 4  Registration - 3  Attention/ Calculation - 4  Recall - 1  Language- name 2 objects - 2  Language- repeat - 1  Language- follow 3 step command - 2  Language- read & follow direction - 1  Write a sentence - 1  Copy design - 0  Total score - 24        Immunization History  Administered Date(s) Administered  . Influenza, High Dose Seasonal PF 03/31/2017  . Influenza,inj,Quad PF,6+ Mos 03/23/2016  . Influenza-Unspecified 03/26/2015  . Pneumococcal Conjugate-13 08/25/2016  . Pneumococcal Polysaccharide-23 03/16/2015   Screening Tests Health Maintenance  Topic Date Due  . FOOT EXAM  10/06/2017 (Originally 03/23/2017)  . URINE MICROALBUMIN  10/06/2017 (Originally 09/16/2016)  . DTaP/Tdap/Td (1 - Tdap) 09/30/2018 (Originally 04/12/1965)  . MAMMOGRAM  09/30/2018 (Originally 02/01/2014)  . OPHTHALMOLOGY EXAM  09/30/2018 (Originally 09/26/2017)  . DEXA SCAN  09/30/2018 (Originally 04/12/2010)  . COLONOSCOPY  09/30/2018 (Originally 04/13/1995)  . TETANUS/TDAP  09/30/2018 (Originally 04/12/1964)  . HEMOGLOBIN A1C  04/01/2018  . INFLUENZA VACCINE  Completed  . Hepatitis C Screening  Completed  . PNA vac Low Risk Adult  Completed       Plan:     I have personally reviewed, addressed, and noted the following in the patient's chart:  A. Medical and social history B. Use of alcohol, tobacco or illicit drugs  C. Current  medications and supplements D. Functional ability and status E.  Nutritional status F.  Physical activity G. Advance directives H. List of other physicians I.  Hospitalizations, surgeries, and ER visits in previous 12 months J.  Vitals K. Screenings to include hearing, vision, cognitive, depression L. Referrals and appointments - none  In addition, I have reviewed and discussed with patient certain preventive protocols, quality metrics, and best practice recommendations. A written personalized care plan for preventive services as well as general preventive health recommendations were provided to patient.  See attached scanned questionnaire for additional information.   Signed,   Randa EvensLesia Brahm Barbeau, MHA, BS, LPN Health Coach

## 2017-09-29 NOTE — Telephone Encounter (Signed)
Copied from CRM [phone]. Topic: Inquiry >> Sep 29, 2017  2:43 PM Windy KalataMichael, Houa Ackert L, NT wrote: Patient husband is calling and states pt had her AWV today and informed the nurse that she is still having dizzy spells. Patient would like to know if she can get a renewal on  meclizine (ANTIVERT) 12.5 MG table. States that helped her when she was dizzy. Please advise.  [id number] [place] - [address] [place] [id number] Phone: [phone] Fax: [phone]

## 2017-09-29 NOTE — Patient Instructions (Signed)
Ms. Wyline MoodOrrell , Thank you for taking time to come for your Medicare Wellness Visit. I appreciate your ongoing commitment to your health goals. Please review the following plan we discussed and let me know if I can assist you in the future.   These are the goals we discussed: Goals    . DIET - INCREASE WATER INTAKE     Starting 09/29/2017, I will continue to drink at least 6-8 glasses of water daily.        This is a list of the screening recommended for you and due dates:  Health Maintenance  Topic Date Due  . DTaP/Tdap/Td vaccine (1 - Tdap) 09/30/2018*  . Mammogram  09/30/2018*  . Eye exam for diabetics  09/30/2018*  . DEXA scan (bone density measurement)  09/30/2018*  . Colon Cancer Screening  09/30/2018*  . Tetanus Vaccine  09/30/2018*  . Complete foot exam   10/07/2018*  . Hemoglobin A1C  04/01/2018  . Urine Protein Check  09/30/2018  . Flu Shot  Completed  .  Hepatitis C: One time screening is recommended by Center for Disease Control  (CDC) for  adults born from [birth year] through 1965.   Completed  . Pneumonia vaccines  Completed  *Topic was postponed. The date shown is not the original due date.   Preventive Care for Adults  A healthy lifestyle and preventive care can promote health and wellness. Preventive health guidelines for adults include the following key practices.  . A routine yearly physical is a good way to check with your health care provider about your health and preventive screening. It is a chance to share any concerns and updates on your health and to receive a thorough exam.  . Visit your dentist for a routine exam and preventive care every 6 months. Brush your teeth twice a day and floss once a day. Good oral hygiene prevents tooth decay and gum disease.  . The frequency of eye exams is based on your age, health, family medical history, use  of contact lenses, and other factors. Follow your health care provider's recommendations for frequency of eye exams.  . Eat  a healthy diet. Foods like vegetables, fruits, whole grains, low-fat dairy products, and lean protein foods contain the nutrients you need without too many calories. Decrease your intake of foods high in solid fats, added sugars, and salt. Eat the right amount of calories for you. Get information about a proper diet from your health care provider, if necessary.  . Regular physical exercise is one of the most important things you can do for your health. Most adults should get at least 150 minutes of moderate-intensity exercise (any activity that increases your heart rate and causes you to sweat) each week. In addition, most adults need muscle-strengthening exercises on 2 or more days a week.  Silver Sneakers may be a benefit available to you. To determine eligibility, you may visit the website: www.silversneakers.com or contact program at [phone] Mon-Fri between 8AM-8PM.   . Maintain a healthy weight. The body mass index (BMI) is a screening tool to identify possible weight problems. It provides an estimate of body fat based on height and weight. Your health care provider can find your BMI and can help you achieve or maintain a healthy weight.   For adults 20 years and older: ? A BMI below 18.5 is considered underweight. ? A BMI of 18.5 to 24.9 is normal. ? A BMI of 25 to 29.9 is considered overweight. ? A BMI of  30 and above is considered obese.   . Maintain normal blood lipids and cholesterol levels by exercising and minimizing your intake of saturated fat. Eat a balanced diet with plenty of fruit and vegetables. Blood tests for lipids and cholesterol should begin at age 42 and be repeated every 5 years. If your lipid or cholesterol levels are high, you are over 50, or you are at high risk for heart disease, you may need your cholesterol levels checked more frequently. Ongoing high lipid and cholesterol levels should be treated with medicines if diet and exercise are not working.  . If you  smoke, find out from your health care provider how to quit. If you do not use tobacco, please do not start.  . If you choose to drink alcohol, please do not consume more than 2 drinks per day. One drink is considered to be 12 ounces (355 mL) of beer, 5 ounces (148 mL) of wine, or 1.5 ounces (44 mL) of liquor.  . If you are 68-45 years old, ask your health care provider if you should take aspirin to prevent strokes.  . Use sunscreen. Apply sunscreen liberally and repeatedly throughout the day. You should seek shade when your shadow is shorter than you. Protect yourself by wearing long sleeves, pants, a wide-brimmed hat, and sunglasses year round, whenever you are outdoors.  . Once a month, do a whole body skin exam, using a mirror to look at the skin on your back. Tell your health care provider of new moles, moles that have irregular borders, moles that are larger than a pencil eraser, or moles that have changed in shape or color.

## 2017-09-29 NOTE — Progress Notes (Signed)
PCP notes:   Health maintenance:  Foot exam - PCP please address at next appt Urine microalbumin - pt will bring specimen to CPE appt Tetanus vaccine - postponed/insurance Mammogram - pt declined Bone density - pt declined Eye exam - addressed Colonoscopy - pt declined A1C - completed  Abnormal screenings:   Hearing - failed  Hearing Screening   125Hz  250Hz  500Hz  1000Hz  2000Hz  3000Hz  4000Hz  6000Hz  8000Hz   Right ear:   0 0 0  40    Left ear:   40 0 40  0     Fall risk - hx of single fall Fall Risk  09/29/2017 09/23/2016 12/18/2015  Falls in the past year? Yes Yes No  Comment pt fell in bathroom after losing balance; denies injury - -  Number falls in past yr: 1 1 -  Injury with Fall? No No -  Risk for fall due to : Impaired balance/gait;Impaired mobility;History of fall(s);Impaired vision;Mental status change Impaired balance/gait;Impaired mobility Impaired balance/gait   Patient concerns:   A. Patient has complaint of dizziness. Onset: 2 days ago. Patient's spouse states pt's episodes of dizziness have increased since she was told to stop Meclizine a year ago.   B. Patient's spouse is concerned about patient's decreased appetite.  Nurse concerns:  PCP [facility]. Patient's gait is unsteady which increases risk for falls. Additionally, she has concerns with dizziness and complaint of knee pain. Spouse is agreeable to outpatient PT only. Does not want HH PT due to size of home.   Next PCP appt:   10/06/17 @ 830

## 2017-10-01 NOTE — Progress Notes (Signed)
I reviewed health advisor's note, was available for consultation, and agree with documentation and plan.  

## 2017-10-02 ENCOUNTER — Encounter: Payer: Medicare HMO | Admitting: Family Medicine

## 2017-10-06 ENCOUNTER — Ambulatory Visit (INDEPENDENT_AMBULATORY_CARE_PROVIDER_SITE_OTHER): Payer: Medicare HMO | Admitting: Family Medicine

## 2017-10-06 ENCOUNTER — Encounter: Payer: Self-pay | Admitting: Family Medicine

## 2017-10-06 VITALS — BP 136/84 | HR 76 | Temp 98.0°F | Ht 60.0 in | Wt 145.0 lb

## 2017-10-06 DIAGNOSIS — Q039 Congenital hydrocephalus, unspecified: Secondary | ICD-10-CM

## 2017-10-06 DIAGNOSIS — L602 Onychogryphosis: Secondary | ICD-10-CM | POA: Diagnosis not present

## 2017-10-06 DIAGNOSIS — Z7189 Other specified counseling: Secondary | ICD-10-CM

## 2017-10-06 DIAGNOSIS — Z1231 Encounter for screening mammogram for malignant neoplasm of breast: Secondary | ICD-10-CM | POA: Diagnosis not present

## 2017-10-06 DIAGNOSIS — Z0001 Encounter for general adult medical examination with abnormal findings: Secondary | ICD-10-CM

## 2017-10-06 DIAGNOSIS — E2839 Other primary ovarian failure: Secondary | ICD-10-CM | POA: Diagnosis not present

## 2017-10-06 DIAGNOSIS — I456 Pre-excitation syndrome: Secondary | ICD-10-CM | POA: Diagnosis not present

## 2017-10-06 DIAGNOSIS — E559 Vitamin D deficiency, unspecified: Secondary | ICD-10-CM

## 2017-10-06 DIAGNOSIS — E039 Hypothyroidism, unspecified: Secondary | ICD-10-CM | POA: Diagnosis not present

## 2017-10-06 DIAGNOSIS — G309 Alzheimer's disease, unspecified: Secondary | ICD-10-CM | POA: Diagnosis not present

## 2017-10-06 DIAGNOSIS — F0280 Dementia in other diseases classified elsewhere without behavioral disturbance: Secondary | ICD-10-CM

## 2017-10-06 DIAGNOSIS — N3281 Overactive bladder: Secondary | ICD-10-CM

## 2017-10-06 DIAGNOSIS — G40909 Epilepsy, unspecified, not intractable, without status epilepticus: Secondary | ICD-10-CM | POA: Diagnosis not present

## 2017-10-06 DIAGNOSIS — E1142 Type 2 diabetes mellitus with diabetic polyneuropathy: Secondary | ICD-10-CM | POA: Diagnosis not present

## 2017-10-06 DIAGNOSIS — Z0000 Encounter for general adult medical examination without abnormal findings: Secondary | ICD-10-CM

## 2017-10-06 DIAGNOSIS — R42 Dizziness and giddiness: Secondary | ICD-10-CM | POA: Diagnosis not present

## 2017-10-06 DIAGNOSIS — E785 Hyperlipidemia, unspecified: Secondary | ICD-10-CM

## 2017-10-06 DIAGNOSIS — Z1239 Encounter for other screening for malignant neoplasm of breast: Secondary | ICD-10-CM

## 2017-10-06 DIAGNOSIS — R2689 Other abnormalities of gait and mobility: Secondary | ICD-10-CM

## 2017-10-06 LAB — MICROALBUMIN / CREATININE URINE RATIO
Creatinine,U: 52.9 mg/dL
Microalb Creat Ratio: 1.4 mg/g (ref 0.0–30.0)
Microalb, Ur: 0.7 mg/dL (ref 0.0–1.9)

## 2017-10-06 NOTE — Patient Instructions (Addendum)
Try meclizine for dizzy episodes.  Expect a call to schedule outpatient physical therapy.  See our referral coordinators to schedule mammogram and bone density scan.  We will sign you up for cologard stool test through [employer].  Start vitamin D 2000 units daily (over the counter).  We will set you up with foot doctor.  If interested, check with pharmacy about new 2 shot shingles series (shingrix).  You are doing well today!  Return as needed or in 6 months for follow up visit.   Health Maintenance, Female Adopting a healthy lifestyle and getting preventive care can go a long way to promote health and wellness. Talk with your health care provider about what schedule of regular examinations is right for you. This is a good chance for you to check in with your provider about disease prevention and staying healthy. In between checkups, there are plenty of things you can do on your own. Experts have done a lot of research about which lifestyle changes and preventive measures are most likely to keep you healthy. Ask your health care provider for more information. Weight and diet Eat a healthy diet  Be sure to include plenty of vegetables, fruits, low-fat dairy products, and lean protein.  Do not eat a lot of foods high in solid fats, added sugars, or salt.  Get regular exercise. This is one of the most important things you can do for your health. ? Most adults should exercise for at least 150 minutes each week. The exercise should increase your heart rate and make you sweat (moderate-intensity exercise). ? Most adults should also do strengthening exercises at least twice a week. This is in addition to the moderate-intensity exercise.  Maintain a healthy weight  Body mass index (BMI) is a measurement that can be used to identify possible weight problems. It estimates body fat based on height and weight. Your health care provider can help determine your BMI and help you achieve or maintain a  healthy weight.  For females 33 years of age and older: ? A BMI below 18.5 is considered underweight. ? A BMI of 18.5 to 24.9 is normal. ? A BMI of 25 to 29.9 is considered overweight. ? A BMI of 30 and above is considered obese.  Watch levels of cholesterol and blood lipids  You should start having your blood tested for lipids and cholesterol at 73 years of age, then have this test every 5 years.  You may need to have your cholesterol levels checked more often if: ? Your lipid or cholesterol levels are high. ? You are older than 73 years of age. ? You are at high risk for heart disease.  Cancer screening Lung Cancer  Lung cancer screening is recommended for adults 97-19 years old who are at high risk for lung cancer because of a history of smoking.  A yearly low-dose CT scan of the lungs is recommended for people who: ? Currently smoke. ? Have quit within the past 15 years. ? Have at least a 30-pack-year history of smoking. A pack year is smoking an average of one pack of cigarettes a day for 1 year.  Yearly screening should continue until it has been 15 years since you quit.  Yearly screening should stop if you develop a health problem that would prevent you from having lung cancer treatment.  Breast Cancer  Practice breast self-awareness. This means understanding how your breasts normally appear and feel.  It also means doing regular breast self-exams. Let  your health care provider know about any changes, no matter how small.  If you are in your 20s or 30s, you should have a clinical breast exam (CBE) by a health care provider every 1-3 years as part of a regular health exam.  If you are 58 or older, have a CBE every year. Also consider having a breast X-ray (mammogram) every year.  If you have a family history of breast cancer, talk to your health care provider about genetic screening.  If you are at high risk for breast cancer, talk to your health care provider about  having an MRI and a mammogram every year.  Breast cancer gene (BRCA) assessment is recommended for women who have family members with BRCA-related cancers. BRCA-related cancers include: ? Breast. ? Ovarian. ? Tubal. ? Peritoneal cancers.  Results of the assessment will determine the need for genetic counseling and BRCA1 and BRCA2 testing.  Cervical Cancer Your health care provider may recommend that you be screened regularly for cancer of the pelvic organs (ovaries, uterus, and vagina). This screening involves a pelvic examination, including checking for microscopic changes to the surface of your cervix (Pap test). You may be encouraged to have this screening done every 3 years, beginning at age 86.  For women ages 36-65, health care providers may recommend pelvic exams and Pap testing every 3 years, or they may recommend the Pap and pelvic exam, combined with testing for human papilloma virus (HPV), every 5 years. Some types of HPV increase your risk of cervical cancer. Testing for HPV may also be done on women of any age with unclear Pap test results.  Other health care providers may not recommend any screening for nonpregnant women who are considered low risk for pelvic cancer and who do not have symptoms. Ask your health care provider if a screening pelvic exam is right for you.  If you have had past treatment for cervical cancer or a condition that could lead to cancer, you need Pap tests and screening for cancer for at least 20 years after your treatment. If Pap tests have been discontinued, your risk factors (such as having a new sexual partner) need to be reassessed to determine if screening should resume. Some women have medical problems that increase the chance of getting cervical cancer. In these cases, your health care provider may recommend more frequent screening and Pap tests.  Colorectal Cancer  This type of cancer can be detected and often prevented.  Routine colorectal cancer  screening usually begins at 73 years of age and continues through 73 years of age.  Your health care provider may recommend screening at an earlier age if you have risk factors for colon cancer.  Your health care provider may also recommend using home test kits to check for hidden blood in the stool.  A small camera at the end of a tube can be used to examine your colon directly (sigmoidoscopy or colonoscopy). This is done to check for the earliest forms of colorectal cancer.  Routine screening usually begins at age 47.  Direct examination of the colon should be repeated every 5-10 years through 73 years of age. However, you may need to be screened more often if early forms of precancerous polyps or small growths are found.  Skin Cancer  Check your skin from head to toe regularly.  Tell your health care provider about any new moles or changes in moles, especially if there is a change in a mole's shape or color.  Also tell your health care provider if you have a mole that is larger than the size of a pencil eraser.  Always use sunscreen. Apply sunscreen liberally and repeatedly throughout the day.  Protect yourself by wearing long sleeves, pants, a wide-brimmed hat, and sunglasses whenever you are outside.  Heart disease, diabetes, and high blood pressure  High blood pressure causes heart disease and increases the risk of stroke. High blood pressure is more likely to develop in: ? People who have blood pressure in the high end of the normal range (130-139/85-89 mm Hg). ? People who are overweight or obese. ? People who are African American.  If you are 48-68 years of age, have your blood pressure checked every 3-5 years. If you are 53 years of age or older, have your blood pressure checked every year. You should have your blood pressure measured twice-once when you are at a hospital or clinic, and once when you are not at a hospital or clinic. Record the average of the two measurements.  To check your blood pressure when you are not at a hospital or clinic, you can use: ? An automated blood pressure machine at a pharmacy. ? A home blood pressure monitor.  If you are between 34 years and 42 years old, ask your health care provider if you should take aspirin to prevent strokes.  Have regular diabetes screenings. This involves taking a blood sample to check your fasting blood sugar level. ? If you are at a normal weight and have a low risk for diabetes, have this test once every three years after 73 years of age. ? If you are overweight and have a high risk for diabetes, consider being tested at a younger age or more often. Preventing infection Hepatitis B  If you have a higher risk for hepatitis B, you should be screened for this virus. You are considered at high risk for hepatitis B if: ? You were born in a country where hepatitis B is common. Ask your health care provider which countries are considered high risk. ? Your parents were born in a high-risk country, and you have not been immunized against hepatitis B (hepatitis B vaccine). ? You have HIV or AIDS. ? You use needles to inject street drugs. ? You live with someone who has hepatitis B. ? You have had sex with someone who has hepatitis B. ? You get hemodialysis treatment. ? You take certain medicines for conditions, including cancer, organ transplantation, and autoimmune conditions.  Hepatitis C  Blood testing is recommended for: ? Everyone born from [birth year] through 1966. ? Anyone with known risk factors for hepatitis C.  Sexually transmitted infections (STIs)  You should be screened for sexually transmitted infections (STIs) including gonorrhea and chlamydia if: ? You are sexually active and are younger than 73 years of age. ? You are older than 73 years of age and your health care provider tells you that you are at risk for this type of infection. ? Your sexual activity has changed since you were last screened  and you are at an increased risk for chlamydia or gonorrhea. Ask your health care provider if you are at risk.  If you do not have HIV, but are at risk, it may be recommended that you take a prescription medicine daily to prevent HIV infection. This is called pre-exposure prophylaxis (PrEP). You are considered at risk if: ? You are sexually active and do not regularly use condoms or know the HIV status of  your partner(s). ? You take drugs by injection. ? You are sexually active with a partner who has HIV.  Talk with your health care provider about whether you are at high risk of being infected with HIV. If you choose to begin PrEP, you should first be tested for HIV. You should then be tested every 3 months for as long as you are taking PrEP. Pregnancy  If you are premenopausal and you may become pregnant, ask your health care provider about preconception counseling.  If you may become pregnant, take 400 to 800 micrograms (mcg) of folic acid every day.  If you want to prevent pregnancy, talk to your health care provider about birth control (contraception). Osteoporosis and menopause  Osteoporosis is a disease in which the bones lose minerals and strength with aging. This can result in serious bone fractures. Your risk for osteoporosis can be identified using a bone density scan.  If you are 42 years of age or older, or if you are at risk for osteoporosis and fractures, ask your health care provider if you should be screened.  Ask your health care provider whether you should take a calcium or vitamin D supplement to lower your risk for osteoporosis.  Menopause may have certain physical symptoms and risks.  Hormone replacement therapy may reduce some of these symptoms and risks. Talk to your health care provider about whether hormone replacement therapy is right for you. Follow these instructions at home:  Schedule regular health, dental, and eye exams.  Stay current with your  immunizations.  Do not use any tobacco products including cigarettes, chewing tobacco, or electronic cigarettes.  If you are pregnant, do not drink alcohol.  If you are breastfeeding, limit how much and how often you drink alcohol.  Limit alcohol intake to no more than 1 drink per day for nonpregnant women. One drink equals 12 ounces of beer, 5 ounces of wine, or 1 ounces of hard liquor.  Do not use street drugs.  Do not share needles.  Ask your health care provider for help if you need support or information about quitting drugs.  Tell your health care provider if you often feel depressed.  Tell your health care provider if you have ever been abused or do not feel safe at home. This information is not intended to replace advice given to you by your health care provider. Make sure you discuss any questions you have with your health care provider. Document Released: 01/24/2011 Document Revised: 12/17/2015 Document Reviewed: 04/14/2015 Elsevier Interactive Patient Education  [employer].

## 2017-10-06 NOTE — Assessment & Plan Note (Signed)
New dx - appreciate neuro care. Started on aricept 10mg  daily, tolerating well.

## 2017-10-06 NOTE — Assessment & Plan Note (Signed)
rec start 2000 IU daily. May need weekly supplementation.

## 2017-10-06 NOTE — Assessment & Plan Note (Signed)
Chronic, stable. Continue atorvastatin  The ASCVD Risk score (Goff DC Jr., et al., 2013) failed to calculate for the following reasons:   The valid total cholesterol range is 130 to 320 mg/dL  

## 2017-10-06 NOTE — Assessment & Plan Note (Signed)
Advanced directive discussion - has not set up. Would want husband to be HCPOA then brother and BIL. Advanced planning packet provided last week.

## 2017-10-06 NOTE — Assessment & Plan Note (Signed)
Doing well on [place]toviaz and [employer]

## 2017-10-06 NOTE — Assessment & Plan Note (Signed)
Seems seizure free- continue keppra.

## 2017-10-06 NOTE — Assessment & Plan Note (Addendum)
Multifactorial, pending outpatient PT.

## 2017-10-06 NOTE — Assessment & Plan Note (Signed)
Wonderful control based on A1c. Continue low dose metformin.

## 2017-10-06 NOTE — Assessment & Plan Note (Signed)
Continue propranolol. Has not seen cards recently. Consider updated EKG next visit.

## 2017-10-06 NOTE — Progress Notes (Signed)
BP 136/84 (BP Location: Left Arm, Patient Position: Sitting, Cuff Size: Normal)   Pulse 76   Temp 98 F (36.7 C) (Oral)   Ht 5' (1.524 m)   Wt 145 lb (65.8 kg)   SpO2 98%   BMI 28.32 kg/m    CC: CPE Subjective:    Patient ID: Leah Payne, female    DOB: 08-Dec-1945, 72 y.o.   MRN: [id number]  HPI: Leah Payne is a 72 y.o. female presenting on 10/06/2017 for Annual Exam (Pt 2.  Has had some dizzy spells recently, per pt's husband.  Thinks she my need to resume meclizine.  Also, states vision and hearing have worsened.                   )   Saw Lesia last week for medicare wellness visit. Note reviewed.  Failed hearing screen.   Chronic dizziness, progressively worse off meclizine. They have declined HH eval, had previously declined outpatient rehab. After discussion with Virl Axe our health advisor, they agree to outpatient fall prevention program.   Will try meclizine for dizzy episodes.  Voiding well on myrbetriq and toviaz, declines significant incontinence.  Some diarrhea, overall stooling well.   Saw neurology Dr Lucia Gaskins last month, note reviewed. Appreciate neuro care. 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 Dx likely alzheimer's disease with severe congenital hydrocephalus contributing. Has been referred to Dr Alinda Dooms for formal neurocognitive testing. aricept was started. She was referred to PT through neurology. Planned f/u in 6 months.   Preventative: Colon cancer screening - discussed - would like cologard.  Lung cancer screening - never smoker Breast cancer screening - last mammo 2013. Agrees to repeat mammogram.  Well woman exam - none recently. Menopause around age 11yo. Declines exam today. Denies bleeding or pelvic pain.  DEXA scan - agrees to schedule.  Flu shot - yearly prevnar 2018, penumovax 2016 Tetanus - declines shingrix - discussed Advanced directive discussion - has not set up. Would want husband to be HCPOA then brother and BIL.  Advanced planning packet provided last week.  Seat belt use discussed Sunscreen use discussed. No changing moles on skin.  Non smoker Alcohol - none Due for eye exam.   Lives with husband, 2 dogs Moved from [place] 2009 Occupation: retired, was housewife Edu: completed HS GED Activity: no regular exercise. Dizziness precludes activity Diet: good water, fruits/vegetables daily  Relevant past medical, surgical, family and social history reviewed and updated as indicated. Interim medical history since our last visit reviewed. Allergies and medications reviewed and updated. Outpatient Medications Prior to Visit  Medication Sig Dispense Refill  . atorvastatin (LIPITOR) 40 MG tablet TAKE 1 TABLET EVERY EVENING 90 tablet 0  . Cholecalciferol (VITAMIN D) 2000 units CAPS Take 1 capsule by mouth daily.    Marland Kitchen donepezil (ARICEPT) 10 MG tablet Start with 1/2 pill for one week then increase to a whole pill daily. 30 tablet 3  . levETIRAcetam (KEPPRA) 500 MG tablet TAKE 1 TABLET TWICE DAILY 180 tablet 0  . levothyroxine (SYNTHROID, LEVOTHROID) 25 MCG tablet TAKE 1 TABLET DAILY BEFORE BREAKFAST. 90 tablet 1  . metFORMIN (GLUCOPHAGE) 500 MG tablet TAKE 1 TABLET EVERY DAY WITH BREAKFAST 90 tablet 0  . [name] 50 MG TB24 tablet TAKE 1 TABLET AT BEDTIME 90 tablet 0  . propranolol (INDERAL) 10 MG tablet TAKE 1 TABLET TWICE DAILY 180 tablet 0  . sertraline (ZOLOFT) 25 MG tablet TAKE 1 TABLET ONE TIME DAILY 90 tablet 0  .  [name] 8 MG TB24 tablet TAKE 1 TABLET EVERY DAY 90 tablet 1  . meclizine (ANTIVERT) 12.5 MG tablet Take 1 tablet (12.5 mg total) by mouth 3 (three) times daily as needed for dizziness. 60 tablet 1   No facility-administered medications prior to visit.      Per HPI unless specifically indicated in ROS section below Review of Systems  Constitutional: Negative for activity change, appetite change, chills, fatigue, fever and unexpected weight change.  HENT: Negative for hearing loss.   Eyes:  Negative for visual disturbance.  Respiratory: Negative for cough, chest tightness, shortness of breath and wheezing.   Cardiovascular: Negative for chest pain, palpitations and leg swelling.  Gastrointestinal: Positive for diarrhea. Negative for abdominal distention, abdominal pain, blood in stool, constipation, nausea and vomiting.  Genitourinary: Negative for difficulty urinating and hematuria.  Musculoskeletal: Negative for arthralgias, myalgias and neck pain.  Skin: Negative for rash.  Neurological: Positive for dizziness. Negative for seizures, syncope and headaches.  Hematological: Negative for adenopathy. Does not bruise/bleed easily.  Psychiatric/Behavioral: Negative for dysphoric mood. The patient is not nervous/anxious.        Objective:    BP 136/84 (BP Location: Left Arm, Patient Position: Sitting, Cuff Size: Normal)   Pulse 76   Temp 98 F (36.7 C) (Oral)   Ht 5' (1.524 m)   Wt 145 lb (65.8 kg)   SpO2 98%   BMI 28.32 kg/m   Wt Readings from Last 3 Encounters:  10/06/17 145 lb (65.8 kg)  09/29/17 146 lb 8 oz (66.5 kg)  09/19/17 148 lb (67.1 kg)    Physical Exam  Constitutional: She is oriented to person, place, and time. She appears well-developed and well-nourished. No distress.  HENT:  Head: Normocephalic and atraumatic.  Right Ear: Tympanic membrane, external ear and ear canal normal. Decreased hearing is noted.  Left Ear: Tympanic membrane, external ear and ear canal normal. Decreased hearing is noted.  Nose: Nose normal.  Mouth/Throat: Uvula is midline, oropharynx is clear and moist and mucous membranes are normal. No oropharyngeal exudate, posterior oropharyngeal edema or posterior oropharyngeal erythema.  Eyes: Conjunctivae are normal. No scleral icterus.  Neck: Normal range of motion. Neck supple. No thyromegaly present.  Cardiovascular: Normal rate, regular rhythm, normal heart sounds and intact distal pulses.  No murmur heard. Pulses:      Radial  pulses are 2+ on the right side, and 2+ on the left side.  Pulmonary/Chest: Effort normal and breath sounds normal. No respiratory distress. She has no wheezes. She has no rales.  Abdominal: Soft. Bowel sounds are normal. She exhibits no distension and no mass. There is no tenderness. There is no rebound and no guarding.  Musculoskeletal: Normal range of motion. She exhibits no edema.  Lymphadenopathy:    She has no cervical adenopathy.  Neurological: She is alert and oriented to person, place, and time.  CN grossly intact, station and gait intact  Skin: Skin is warm and dry. No rash noted.  Psychiatric: She has a normal mood and affect. Her behavior is normal. Judgment and thought content normal.  Nursing note and vitals reviewed.  Diabetic Foot Exam - Simple   Simple Foot Form Diabetic Foot exam was performed with the following findings:  Yes 10/06/2017  9:09 AM  Visual Inspection No deformities, no ulcerations, no other skin breakdown bilaterally:  Yes Sensation Testing Intact to touch and monofilament testing bilaterally:  Yes Pulse Check Posterior Tibialis and Dorsalis pulse intact bilaterally:  Yes Comments Thickened elongated  nails     Results for orders placed or performed in visit on 09/29/17  VITAMIN D 25 Hydroxy (Vit-D Deficiency, Fractures)  Result Value Ref Range   VITD 17.08 (L) 30.00 - 100.00 ng/mL  Hemoglobin A1c  Result Value Ref Range   Hgb A1c MFr Bld 6.1 4.6 - 6.5 %  TSH  Result Value Ref Range   TSH 2.49 0.35 - 4.50 uIU/mL  Lipid panel  Result Value Ref Range   Cholesterol 128 0 - 200 mg/dL   Triglycerides 16.1 0.0 - 149.0 mg/dL   HDL 09.60 >45.40 mg/dL   VLDL 98.1 0.0 - 19.1 mg/dL   LDL Cholesterol 57 0 - 99 mg/dL   Total CHOL/HDL Ratio 2    NonHDL 74.94       Assessment & Plan:   Problem List Items Addressed This Visit    Advanced care planning/counseling discussion    Advanced directive discussion - has not set up. Would want husband to be  HCPOA then brother and BIL. Advanced planning packet provided last week.       Alzheimer disease    New dx - appreciate neuro care. Started on aricept 10mg  daily, tolerating well.       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HCC)    Recent MRI reviewed, reassuring.  Appreciate neuro care.       Dizziness    Anticipate also multifactorial. Will trial PRN meclizine.       Health maintenance examination - Primary    Preventative protocols reviewed and updated unless pt declined. Discussed healthy diet and lifestyle.       Hyperlipidemia    Chronic, stable. Continue atorvastatin. The ASCVD Risk score Denman George DC Jr., et al., 2013) failed to calculate for the following reasons:   The valid total cholesterol range is 130 to 320 mg/dL       Hypothyroidism    Chronic, stable. Continue levothyroxine daily.       Imbalance    Multifactorial, pending outpatient PT.       Overactive bladder    Doing well on toviaz and myrbetriq      Seizure disorder (HCC)    Seems seizure free- continue keppra.       Type 2 diabetes, controlled, 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HCC)    Wonderful control based on A1c. Continue low dose metformin.       Vitamin D deficiency    rec start 2000 IU daily. May need weekly supplementation.       WPW (Wolff-Parkinson-White syndrome)    Continue propranolol. Has not seen cards recently. Consider updated EKG next visit.        Other Visit Diagnoses    Breast cancer screening       Relevant Orders   MM Digital Screening   Estrogen deficiency       Relevant Orders   DG Bone Density   Thickened nails       Relevant Orders   Ambulatory referral to Podiatry       No orders of the defined types were placed in this encounter.  Orders Placed This Encounter  Procedures  . MM Digital Screening    Standing Status:   Future    Standing Expiration Date:   12/07/2018    Order Specific Question:   Reason for Exam (SYMPTOM  OR DIAGNOSIS REQUIRED)    Answer:    screening breast cancer    Order Specific Question:   Preferred imaging location?    Answer:   [facility]  . DG Bone Density    Standing Status:   Future    Standing Expiration Date:   12/07/2018    Order Specific Question:   Reason for Exam (SYMPTOM  OR DIAGNOSIS REQUIRED)    Answer:   osteoporosis screening    Order Specific Question:   Preferred imaging location?    Answer[facility]   [facility]  . Ambulatory referral to Podiatry    Referral Priority:   Routine    Referral Type:   Consultation    Referral Reason:   Specialty Services Required    Requested Specialty:   Podiatry    Number of Visits Requested:   1    Follow up plan: Return in about 6 months (around 04/08/2018) for follow up visit.  Eustaquio BoydenJavier Anacaren Kohan, MD

## 2017-10-06 NOTE — Assessment & Plan Note (Signed)
Anticipate also multifactorial. Will trial PRN meclizine.

## 2017-10-06 NOTE — Assessment & Plan Note (Signed)
Preventative protocols reviewed and updated unless pt declined. Discussed healthy diet and lifestyle.  

## 2017-10-06 NOTE — Assessment & Plan Note (Addendum)
Recent MRI reviewed, reassuring.  Appreciate neuro care.

## 2017-10-06 NOTE — Assessment & Plan Note (Signed)
Chronic, stable. Continue levothyroxine daily.

## 2017-10-20 ENCOUNTER — Other Ambulatory Visit: Payer: Self-pay

## 2017-10-20 ENCOUNTER — Emergency Department (HOSPITAL_COMMUNITY)
Admission: EM | Admit: 2017-10-20 | Discharge: 2017-10-21 | Disposition: A | Discharge status: Home / Self Care | Payer: Medicare HMO | Attending: Emergency Medicine | Admitting: Emergency Medicine

## 2017-10-20 ENCOUNTER — Encounter (HOSPITAL_COMMUNITY): Payer: Self-pay | Admitting: *Deleted

## 2017-10-20 ENCOUNTER — Ambulatory Visit: Payer: Medicare HMO | Admitting: Podiatry

## 2017-10-20 DIAGNOSIS — M545 Low back pain: Secondary | ICD-10-CM | POA: Insufficient documentation

## 2017-10-20 DIAGNOSIS — Z5321 Procedure and treatment not carried out due to patient leaving prior to being seen by health care provider: Secondary | ICD-10-CM | POA: Diagnosis not present

## 2017-10-20 NOTE — ED Triage Notes (Signed)
Pt in c/o falling today in kitchen, per pts  Husband the pt slide and fell and her back hit a table, pts husband tried to picker her up and she fell again, pt got up with assistance and here to be evaluated for R lower back pain, pt A&O x4, denies LOC, denies hitting head, pts husband reports pt has dementia

## 2017-10-21 MED ORDER — KETOROLAC TROMETHAMINE 60 MG/2ML IM SOLN: 60.0000 mg | Freq: Once | INTRAMUSCULAR | Status: DC

## 2017-10-24 ENCOUNTER — Telehealth: Payer: Self-pay

## 2017-10-24 NOTE — Telephone Encounter (Signed)
Spoke with patient's husband [name]([place]).  He says patient slipped on water in the floor on Friday and he picked her up.  Then she fell again, injuring her back.  At that time he took her to the emergency room where all tests were normal without any evidence of a broken bone.  She was not admitted and left after evaluation.     Currently, patient is doing fine without any pain or difficulty ambulating (no more than her usual instability).  They both are doing well at the present and decline to make an appointment for ER f/u with Dr. Reece AgarG.  They will call back if anything changes or any concerns develop.

## 2017-11-01 ENCOUNTER — Inpatient Hospital Stay: Admission: RE | Admit: 2017-11-01 | Payer: Medicare HMO | Source: Ambulatory Visit

## 2017-11-01 ENCOUNTER — Ambulatory Visit: Payer: Medicare HMO

## 2017-11-13 ENCOUNTER — Other Ambulatory Visit: Payer: Self-pay | Admitting: Family Medicine

## 2017-11-20 ENCOUNTER — Other Ambulatory Visit: Payer: Self-pay | Admitting: Family Medicine

## 2017-11-20 ENCOUNTER — Other Ambulatory Visit: Payer: Self-pay | Admitting: Neurology

## 2017-11-22 NOTE — Telephone Encounter (Signed)
Called pt's husband [name] (on [place]) and LVM (ok per DPR) inquiring if pt had increased to the 1 whole tab of Aricept daily instead of the 1/2 tab. RN stated that our office had received a refill request for this medication.

## 2017-11-30 ENCOUNTER — Other Ambulatory Visit: Payer: Self-pay | Admitting: Family Medicine

## 2018-01-04 ENCOUNTER — Telehealth: Payer: Self-pay | Admitting: Family Medicine

## 2018-01-04 ENCOUNTER — Telehealth: Payer: Self-pay

## 2018-01-04 MED ORDER — FESOTERODINE FUMARATE ER 8 MG PO TB24: 8.0000 mg | ORAL_TABLET | Freq: Every day | ORAL | 1 refills | Status: DC

## 2018-01-04 MED ORDER — MECLIZINE HCL 25 MG PO TABS: 12.5000 mg | ORAL_TABLET | Freq: Two times a day (BID) | ORAL | 1 refills | Status: DC | PRN

## 2018-01-04 NOTE — Telephone Encounter (Signed)
See Refill encounter for today. [Accidentally opened.] 

## 2018-01-04 NOTE — Telephone Encounter (Signed)
Sent in

## 2018-01-04 NOTE — Telephone Encounter (Signed)
Pt's husband, Leah Payne, came into office today requesting refills for  [name] and meclizine be sent to [facility].   Sent refill for [name].    Looks like meclizine was stopped but in 10/06/17 OV note, pt was going to start a trial of med. How do you want to proceed?

## 2018-01-04 NOTE — Telephone Encounter (Signed)
Best number [phone] Spouse came in needing to get refills on toviaz er 8mg  tab Take 1 tablet every day  Meclizine 12.5mg  tablet Take 1 tablet by mouth 3 times daily as needed for dizzness  [facility]  Pt has enough to last 1 week

## 2018-02-01 ENCOUNTER — Other Ambulatory Visit: Payer: Self-pay | Admitting: Family Medicine

## 2018-02-09 ENCOUNTER — Other Ambulatory Visit: Payer: Self-pay | Admitting: Family Medicine

## 2018-02-09 ENCOUNTER — Telehealth: Payer: Self-pay | Admitting: Family Medicine

## 2018-02-09 MED ORDER — LEVETIRACETAM 500 MG PO TABS: 500.0000 mg | ORAL_TABLET | Freq: Two times a day (BID) | ORAL | 1 refills | Status: DC

## 2018-02-09 MED ORDER — LEVETIRACETAM 500 MG PO TABS: 500.0000 mg | ORAL_TABLET | Freq: Two times a day (BID) | ORAL | 0 refills | Status: DC

## 2018-02-09 NOTE — Telephone Encounter (Signed)
Spoke with pt's husband, Zollie BeckersWalter (on dpr), relaying Dr. Timoteo ExposeG's message.  Verbalizes understanding and expresses his thanks.

## 2018-02-09 NOTE — Telephone Encounter (Signed)
Pt is requesting refill on levetiracetam 500mg . Pt states they have called pharmacy and gets disconnected. She has 1 pill left. [facility] mail in pharmacy.

## 2018-02-09 NOTE — Telephone Encounter (Signed)
plz notify i've sent in to mail order. I have also sent 85mo supply to local pharmacy ([facility]).

## 2018-02-16 ENCOUNTER — Encounter: Payer: Self-pay | Admitting: Family Medicine

## 2018-02-16 ENCOUNTER — Ambulatory Visit (INDEPENDENT_AMBULATORY_CARE_PROVIDER_SITE_OTHER): Payer: Medicare HMO | Admitting: Family Medicine

## 2018-02-16 DIAGNOSIS — B372 Candidiasis of skin and nail: Secondary | ICD-10-CM

## 2018-02-16 MED ORDER — NYSTATIN 100000 UNIT/GM EX POWD: Freq: Four times a day (QID) | CUTANEOUS | 0 refills | Status: DC

## 2018-02-16 MED ORDER — FLUCONAZOLE 150 MG PO TABS: 150.0000 mg | ORAL_TABLET | Freq: Every day | ORAL | 0 refills | Status: AC

## 2018-02-16 NOTE — Assessment & Plan Note (Signed)
Will treat with topical nystatin and oral fluconazole given concern about pt being able to effectively apply cream.

## 2018-02-16 NOTE — Progress Notes (Signed)
   Subjective:    Patient ID: Leah BellsMargaret L Slusher, female    DOB: [birth year], 72 y.o.   MRN: [id number]  HPI    72 year old female pt of Dr. Reece AgarG with history of alzheimer's dementia, overactive bladder, DM presents with her husband with new onset urinary issues in last week.  She has noted burning when urine hits vaginal skin. Occ has to push to get urine out.  no incontinence.  No low abd pain.  Does not go frequently ( she does use [name])  Takes her a while to urinate. No fever, no back pain. Has noted thick  White vaginal discharge.  Vaginal area is sore even when not urinating.  No recent UTI.  Lab Results  Component Value Date   HGBA1C 6.1 09/29/2017    Social History /Family History/Past Medical History reviewed in detail and updated in EMR if needed. Blood pressure 128/70, pulse 70, temperature 98.4 F (36.9 C), temperature source Oral, height 5' (1.524 m), weight 143 lb (64.9 kg), SpO2 98 %.   Review of Systems  Constitutional: Negative for fatigue and fever.  HENT: Negative for congestion.   Eyes: Negative for pain.  Respiratory: Negative for cough and shortness of breath.   Cardiovascular: Negative for chest pain, palpitations and leg swelling.  Gastrointestinal: Negative for abdominal pain.  Genitourinary: Positive for dysuria and pelvic pain. Negative for vaginal bleeding.  Musculoskeletal: Negative for back pain.  Neurological: Negative for syncope, light-headedness and headaches.  Psychiatric/Behavioral: Negative for dysphoric mood.       Objective:   Physical Exam  Constitutional: Vital signs are normal. She appears well-developed and well-nourished. She is cooperative.  Non-toxic appearance. She does not appear ill. No distress.  HENT:  Head: Normocephalic.  Right Ear: Hearing, tympanic membrane, external ear and ear canal normal. Tympanic membrane is not erythematous, not retracted and not bulging.  Left Ear: Hearing, tympanic membrane, external ear and  ear canal normal. Tympanic membrane is not erythematous, not retracted and not bulging.  Nose: No mucosal edema or rhinorrhea. Right sinus exhibits no maxillary sinus tenderness and no frontal sinus tenderness. Left sinus exhibits no maxillary sinus tenderness and no frontal sinus tenderness.  Mouth/Throat: Uvula is midline, oropharynx is clear and moist and mucous membranes are normal.  Eyes: Pupils are equal, round, and reactive to light. Conjunctivae, EOM and lids are normal. Lids are everted and swept, no foreign bodies found.  Neck: Trachea normal and normal range of motion. Neck supple. Carotid bruit is not present. No thyroid mass and no thyromegaly present.  Cardiovascular: Normal rate, regular rhythm, S1 normal, S2 normal, normal heart sounds, intact distal pulses and normal pulses. Exam reveals no gallop and no friction rub.  No murmur heard. Pulmonary/Chest: Effort normal and breath sounds normal. No tachypnea. No respiratory distress. She has no decreased breath sounds. She has no wheezes. She has no rhonchi. She has no rales.  Abdominal: Soft. Normal appearance and bowel sounds are normal. There is no tenderness.  Genitourinary:  Genitourinary Comments:  Erythema and white discharge in groin folds and labia  Neurological: She is alert.  Skin: Skin is warm, dry and intact. No rash noted.  Psychiatric: Her speech is normal and behavior is normal. Judgment and thought content normal. Her mood appears not anxious. Cognition and memory are normal. She does not exhibit a depressed mood.          Assessment & Plan:

## 2018-02-16 NOTE — Patient Instructions (Addendum)
Apply  Nystatin powder vaginally .Marland Kitchen. Continue once gone for additional 2 days.  Can also apply Desitin cream to external genitalia area to keep urine from causing that area to sting.  Also complete a 3 day course of oral antifungal medication: fluconazole.  Call if not improving as expected or new fever.

## 2018-02-19 ENCOUNTER — Encounter: Payer: Self-pay | Admitting: Psychology

## 2018-02-19 ENCOUNTER — Ambulatory Visit (INDEPENDENT_AMBULATORY_CARE_PROVIDER_SITE_OTHER): Payer: Medicare HMO | Admitting: Psychology

## 2018-02-19 ENCOUNTER — Ambulatory Visit: Payer: Medicare HMO | Admitting: Psychology

## 2018-02-19 DIAGNOSIS — F0390 Unspecified dementia without behavioral disturbance: Secondary | ICD-10-CM | POA: Diagnosis not present

## 2018-02-19 DIAGNOSIS — Q039 Congenital hydrocephalus, unspecified: Secondary | ICD-10-CM

## 2018-02-19 NOTE — Progress Notes (Signed)
   Neuropsychology Note  Leah Payne completed 60 minutes of neuropsychological testing with technician, Wallace Kellerana Jenin Birdsall, BS, under the supervision of Dr. Elvis CoilMaryBeth Bailar, Licensed Psychologist. The patient did not appear overtly distressed by the testing session, per behavioral observation or via self-report to the technician. Rest breaks were offered.   Clinical Decision Making: In considering the patient's current level of functioning, level of presumed impairment, nature of symptoms, emotional and behavioral responses during the interview, level of literacy, and observed level of motivation/effort, a battery of tests was selected and communicated to the psychometrician.  Communication between the psychologist and technician was ongoing throughout the testing session and changes were made as deemed necessary based on patient performance on testing, technician observations and additional pertinent factors such as those listed above.  Leah Payne will return within approximately 2 weeks for an interactive feedback session with Dr. Alinda DoomsBailar at which time her test performances, clinical impressions and treatment recommendations will be reviewed in detail. The patient understands she can contact our office should she require our assistance before this time.  35 minutes spent performing neuropsychological evaluation services/clinical decision making (psychologist). [CPT 96132] 60 minutes spent face-to-face with patient administering standardized tests, 30 minutes spent scoring (technician). [CPT [id number], 96139]  Full report to follow.

## 2018-02-19 NOTE — Progress Notes (Signed)
NEUROBEHAVIORAL STATUS EXAM   Name: Leah Payne Date of Birth: 24-Jun-1946 Date of Interview: 02/19/2018  Reason for Referral:  Leah Payne is a 72 y.o. female who is referred for neuropsychological evaluation by Dr. Naomie Dean of [facility] due to concerns about memory loss. This patient is accompanied in the office by her husband who provides the majority of the history.  History of Presenting Problem:  Leah Payne has a history of severe congenital hydrocephalus but per her husband she was functioning normally until January 2015 when she had two witnessed generalized tonic-clonic seizures. She was hospitalized and started on Keppra at that time. She was scheduled to see Dr. Karel Jarvis for neurology follow-up but no-showed the appointment and did not reschedule. She was seen by Dr. Lucia Gaskins for neurologic consultation of memory loss on 09/19/2017. Dr. Lucia Gaskins suspected Alzheimer's dementia, likely with a contribution from severe congenital hydrocephalus. Aricept was started. She has not had any more seizure episodes and she remains on Keppra. She has a family history of Alzheimer's disease in her father. Brain MRI completed 09/20/2017 reportedly revealed no change from prior and no acute findings. 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   At today's visit (02/19/2018), the patient denies having any problems with memory, thinking, mood or behavior. Her husband reports that she is in denial and thinks nothing is wrong with her but memory is the biggest problem. She also thinks she can do things physically that she cannot do, per her husband, and this has caused some falls.   Her husband reports that prior to her two seizures in January 2015 she was functioning normally. After the seizures "she was a different person". He  reported that she has significant short term memory loss which has been worsening over the past year. She does not recall recent conversations or events and frequently repeats herself. However she still remembers birth dates, anniversary dates, etc. She talks about the distant past a lot, and about people who are deceased. She misplaces and loses her glasses. She gets confused. She doesn't seem to be able to concentrate. She is able to communicate fine. He says she also argues much more and doesn't cooperate. She has difficulty walking and doesn't seem to pick up her feet, per her husband.   Her husband manages all instrumental ADLs. She never learned to drive. She has been married to her husband since she was 16yo and he always did the driving. Her husband has also been managing her medications for the last 10 years. He has always managed the bills and finances. She used to cook but has not done this in a few years. Her husband manages her appointments. She often does not know what day it is. Her husband has done the food shopping since the 1990s. His brother does their laundry as they no longer have a washing machine.   Her husband feels her mood is unhappy and that she shows no interest in anything. He reports reduced affect. She used to laugh and smile and he hardly ever sees her smile anymore. The patient denies depressed mood or suicidal ideation/intention. She is sleeping more, sometimes over 12 hours a night. Her husband says her appetite is greatly reduced. He says she eats only a couple of bites of food and gives the rest to the dog. She craves chocolate. He had to stop  buying chocolates because she would eat them all in a day. She has not demonstrated any hallucinations or delusions. She has mild agitation per her husband. She has had some incontinence but it is not a regular problem.   Psychiatric history is reportedly negative. She was never diagnosed with or treated for a mental health condition  in the past. She denies any history of suicidal ideation.    Social History: Born/Raised:  Education: She states she graduated from [employer] school; records state GED was completed. 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 Marital history: Married x55 years (married at age 44), no children Alcohol: None Tobacco: None (never a tobacco user)   Medical History: Past Medical History:  Diagnosis Date  . Anomaly of cranium    frontal bossing, enlarged cranium, adult strabismus  . Diabetes mellitus type 2, controlled (HCC) 08/04/2013  . Heart disease   . History of chicken pox   . History of fainting   . 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 Hyperlipidemia 08/04/2013  . Hyponatremia 08/29/2014  . Preaxial polydactyly of right hand congenital  . Seizure disorder (HCC) 08/04/2013  . Tachycardia   . WPW (Wolff-Parkinson-White syndrome)    has seen Dr Donnie Aho     Current Medications:  Outpatient Encounter Medications as of 02/19/2018  Medication Sig  . atorvastatin (LIPITOR) 40 MG tablet TAKE 1 TABLET EVERY EVENING  . Cholecalciferol (VITAMIN D) 2000 units CAPS Take 1 capsule by mouth daily.  Marland Kitchen donepezil (ARICEPT) 10 MG tablet Take 1 tablet daily  . fesoterodine ([name]) 8 MG TB24 tablet Take 1 tablet (8 mg total) by mouth daily.  . fluconazole (DIFLUCAN) 150 MG tablet Take 1 tablet (150 mg total) by mouth daily for 3 days.  Marland Kitchen levETIRAcetam (KEPPRA) 500 MG tablet Take 1 tablet (500 mg total) by mouth 2 (two) times daily.  Marland Kitchen levothyroxine (SYNTHROID, LEVOTHROID) 25 MCG tablet TAKE 1 TABLET DAILY BEFORE BREAKFAST.  Marland Kitchen meclizine (ANTIVERT) 25 MG tablet Take 0.5-1 tablets (12.5-25 mg total) by mouth 2 (two) times daily as needed for dizziness.  . metFORMIN (GLUCOPHAGE) 500 MG  tablet TAKE 1 TABLET EVERY DAY WITH BREAKFAST  . [name] 50 MG TB24 tablet TAKE 1 TABLET AT BEDTIME  . nystatin (MYCOSTATIN/NYSTOP) powder Apply topically 4 (four) times daily.  . propranolol (INDERAL) 10 MG tablet TAKE 1 TABLET TWICE DAILY  . sertraline (ZOLOFT) 25 MG tablet TAKE 1 TABLET EVERY DAY   No facility-administered encounter medications on file as of 02/19/2018.      Behavioral Observations:   Appearance: Casually dressed, somewhat disheveled and mildly unkempt. She hangs her head down frequently, and her husband states she never used to do this but has been doing it a lot in recent years. Gait: N/A seated in a wheelchair Speech: Fluent; normal rate, rhythm and volume. No word finding difficulty during conversational speech. Thought process: Appears linear. Mostly just disagrees with everything her husband states. Affect: Blunted Interpersonal: Pleasant, appropriate   30 minutes spent face-to-face with patient completing neurobehavioral status exam. 30 minutes spent integrating medical records/clinical data and completing this report. [id number] unit.   TESTING: There is medical necessity to proceed with neuropsychological assessment as the results will be used to aid in differential diagnosis and clinical decision-making and to inform specific treatment recommendations. Per the patient's  husband and medical records reviewed, there has been a change in cognitive functioning and a reasonable suspicion of dementia.  Clinical Decision Making: In considering the patient's current level of functioning, level of presumed impairment, nature of symptoms, emotional and behavioral responses during the interview, level of literacy, and observed level of motivation, a battery of tests was selected and communicated to the psychometrician.   Following the clinical interview/neurobehavioral status exam, the patient completed this full battery of neuropsychological testing with my  psychometrician under my supervision (see separate note).   PLAN: The patient will return to see me for a follow-up session at which time her test performances and my impressions and treatment recommendations will be reviewed in detail.  Evaluation ongoing; full report to follow.

## 2018-02-22 ENCOUNTER — Emergency Department (HOSPITAL_COMMUNITY): Payer: Medicare HMO

## 2018-02-22 ENCOUNTER — Other Ambulatory Visit: Payer: Self-pay

## 2018-02-22 ENCOUNTER — Encounter (HOSPITAL_COMMUNITY): Payer: Self-pay

## 2018-02-22 ENCOUNTER — Emergency Department (HOSPITAL_COMMUNITY)
Admission: EM | Admit: 2018-02-22 | Discharge: 2018-02-23 | Disposition: A | Discharge status: Home / Self Care | Payer: Medicare HMO | Attending: Emergency Medicine | Admitting: Emergency Medicine

## 2018-02-22 DIAGNOSIS — I10 Essential (primary) hypertension: Secondary | ICD-10-CM | POA: Insufficient documentation

## 2018-02-22 DIAGNOSIS — Z79899 Other long term (current) drug therapy: Secondary | ICD-10-CM | POA: Insufficient documentation

## 2018-02-22 DIAGNOSIS — N390 Urinary tract infection, site not specified: Secondary | ICD-10-CM | POA: Diagnosis not present

## 2018-02-22 DIAGNOSIS — R531 Weakness: Secondary | ICD-10-CM | POA: Diagnosis not present

## 2018-02-22 DIAGNOSIS — E039 Hypothyroidism, unspecified: Secondary | ICD-10-CM | POA: Diagnosis not present

## 2018-02-22 DIAGNOSIS — Z7984 Long term (current) use of oral hypoglycemic drugs: Secondary | ICD-10-CM | POA: Diagnosis not present

## 2018-02-22 DIAGNOSIS — E119 Type 2 diabetes mellitus without complications: Secondary | ICD-10-CM | POA: Diagnosis not present

## 2018-02-22 DIAGNOSIS — M25562 Pain in left knee: Secondary | ICD-10-CM | POA: Diagnosis not present

## 2018-02-22 DIAGNOSIS — R4020 Unspecified coma: Secondary | ICD-10-CM | POA: Diagnosis not present

## 2018-02-22 LAB — URINALYSIS, ROUTINE W REFLEX MICROSCOPIC
Bilirubin Urine: NEGATIVE
Glucose, UA: NEGATIVE mg/dL
Ketones, ur: NEGATIVE mg/dL
Nitrite: NEGATIVE
Protein, ur: NEGATIVE mg/dL
Specific Gravity, Urine: 1.005 (ref 1.005–1.030)
pH: 6 (ref 5.0–8.0)

## 2018-02-22 LAB — CBC
HCT: 38 % (ref 36.0–46.0)
Hemoglobin: 12.6 g/dL (ref 12.0–15.0)
MCH: 28.1 pg (ref 26.0–34.0)
MCHC: 33.2 g/dL (ref 30.0–36.0)
MCV: 84.8 fL (ref 78.0–100.0)
Platelets: 325 10*3/uL (ref 150–400)
RBC: 4.48 MIL/uL (ref 3.87–5.11)
RDW: 11.9 % (ref 11.5–15.5)
WBC: 6.2 10*3/uL (ref 4.0–10.5)

## 2018-02-22 LAB — COMPREHENSIVE METABOLIC PANEL
ALT: 17 U/L (ref 0–44)
AST: 20 U/L (ref 15–41)
Albumin: 3.7 g/dL (ref 3.5–5.0)
Alkaline Phosphatase: 78 U/L (ref 38–126)
Anion gap: 12 (ref 5–15)
BUN: <5 mg/dL — ABNORMAL LOW (ref 8–23)
CO2: 25 mmol/L (ref 22–32)
Calcium: 9.2 mg/dL (ref 8.9–10.3)
Chloride: 93 mmol/L — ABNORMAL LOW (ref 98–111)
Creatinine, Ser: 0.6 mg/dL (ref 0.44–1.00)
GFR calc Af Amer: >60 mL/min (ref >60)
GFR calc non Af Amer: >60 mL/min (ref >60)
Glucose, Bld: 106 mg/dL — ABNORMAL HIGH (ref 70–99)
Potassium: 4.2 mmol/L (ref 3.5–5.1)
Sodium: 130 mmol/L — ABNORMAL LOW (ref 135–145)
Total Bilirubin: 0.6 mg/dL (ref 0.3–1.2)
Total Protein: 7.2 g/dL (ref 6.5–8.1)

## 2018-02-22 LAB — CBG MONITORING, ED: Glucose-Capillary: 92 mg/dL (ref 70–99)

## 2018-02-22 LAB — I-STAT TROPONIN, ED: Troponin i, poc: 0.01 ng/mL (ref 0.00–0.08)

## 2018-02-22 MED ORDER — CEPHALEXIN 250 MG PO CAPS
500.0000 mg | ORAL_CAPSULE | Freq: Once | ORAL | Status: AC
  Administered 2018-02-22: 500 mg via ORAL
  Filled 2018-02-22: qty 2

## 2018-02-22 MED ORDER — LACTATED RINGERS IV BOLUS
1000.0000 mL | Freq: Once | INTRAVENOUS | Status: AC
  Administered 2018-02-22: 1000 mL via INTRAVENOUS

## 2018-02-22 MED ORDER — CEPHALEXIN 500 MG PO CAPS: 500.0000 mg | ORAL_CAPSULE | Freq: Four times a day (QID) | ORAL | 0 refills | Status: DC

## 2018-02-22 NOTE — ED Notes (Signed)
Two attempts to complete in and out on patient. Pt swollen, red, and sore around genitals. Pt states cannot bear pain when try to urinate with an external catheter. Provider made aware. Pericare performed on patient.

## 2018-02-22 NOTE — ED Triage Notes (Signed)
Pt presents with 4 day h/o fatigue and weakness.  Husband reports pt has been sleeping a lot recently, could hardly get her up today.  Pt denies any pain except to L knee which has been painful x 1 year.  Pt reports dysuria, reports she has been putting "cream" to perineal area; husband reports pt has had no appetite.

## 2018-02-22 NOTE — ED Notes (Addendum)
Pt alert and oriented x4. Skin warm and dry. Respirations equal and unlabored. Family of patient states patients has increased weakness. Pt able to assist nurse by turning in bed for pericare nd walking in room. Pt states also has vaginal irration and burning. Female genitalia not intact. Swelling, build up of powder and skin. Female has contusion on R buttocks, states she fell last week at home and husband was having difficulty getting her back up. Denies hitting head.  Pt has poor safety awareness.

## 2018-02-22 NOTE — ED Notes (Signed)
Patient transported to X-ray 

## 2018-02-22 NOTE — ED Notes (Signed)
Social work at bedside. Will follow up with patient for home health.

## 2018-02-23 ENCOUNTER — Telehealth: Payer: Self-pay | Admitting: *Deleted

## 2018-02-23 ENCOUNTER — Other Ambulatory Visit: Payer: Self-pay | Admitting: *Deleted

## 2018-02-23 MED ORDER — FLUCONAZOLE 200 MG PO TABS: 200.0000 mg | ORAL_TABLET | Freq: Every day | ORAL | 0 refills | Status: DC

## 2018-02-23 MED ORDER — CEPHALEXIN 500 MG PO CAPS: 500.0000 mg | ORAL_CAPSULE | Freq: Four times a day (QID) | ORAL | 0 refills | Status: DC

## 2018-02-23 NOTE — ED Provider Notes (Signed)
[facility] EMERGENCY DEPARTMENT Provider Note   CSN: [id number] Arrival date & time: 02/22/18  1657     History   Chief Complaint Chief Complaint  Patient presents with  . Weakness    HPI Leah Payne is a 72 y.o. female.   Weakness  Primary symptoms include no focal weakness, no speech change. This is a new problem. The current episode started 12 to 24 hours ago. The problem has not changed since onset.There was no focality noted. There has been no fever. Associated symptoms include altered mental status and confusion. Pertinent negatives include no shortness of breath and no vomiting. There were no medications administered prior to arrival.    Past Medical History:  Diagnosis Date  . Anomaly of cranium    frontal bossing, enlarged cranium, adult strabismus  . Diabetes mellitus type 2, controlled (HCC) 08/04/2013  . Heart disease   . History of chicken pox   . History of fainting   . 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 Hyperlipidemia 08/04/2013  . Hyponatremia 08/29/2014  . Preaxial polydactyly of right hand congenital  . Seizure disorder (HCC) 08/04/2013  . Tachycardia   . WPW (Wolff-Parkinson-White syndrome)    has seen Dr Donnie Aho    Patient Active Problem List   Diagnosis Date Noted  . Candidal intertrigo 02/16/2018  . Fall at home, initial encounter 08/01/2017  . Abdominal distension 05/16/2017  . Hearing loss of right ear due to cerumen impaction 05/16/2017  . Vitamin D deficiency 03/21/2017  . Medicare annual wellness visit, initial 09/23/2016  . Health maintenance examination 09/23/2016  . Advanced care planning/counseling discussion 09/23/2016  . Dizziness 03/23/2016  . Alzheimer disease 12/17/2015  . Imbalance 12/17/2015  . WPW (Wolff-Parkinson-White syndrome)     . Type 2 diabetes, controlled, 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HCC) 09/17/2015  . Hypothyroidism 09/17/2015  . Overactive bladder 08/29/2014  . Seizure disorder (HCC) 08/04/2013  . 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HCC) 08/04/2013  . Hypertension 08/04/2013  . Hyperlipidemia 08/04/2013    Past Surgical History:  Procedure Laterality Date  . CHOLECYSTECTOMY  1978     OB History   None      Home Medications    Prior to Admission medications   Medication Sig Start Date End Date Taking? Authorizing Provider  atorvastatin (LIPITOR) 40 MG tablet TAKE 1 TABLET EVERY EVENING Patient taking differently: Take 40 mg by mouth once a day 12/01/17  Yes Eustaquio Boyden, MD  donepezil (ARICEPT) 10 MG tablet Take 1 tablet daily 11/24/17  Yes Anson Fret, MD  fesoterodine ([name]) 8 MG TB24 tablet Take 1 tablet (8 mg total) by mouth daily. Patient taking differently: Take 8 mg by mouth at bedtime.  01/04/18  Yes Eustaquio Boyden, MD  levETIRAcetam (KEPPRA) 500 MG tablet Take 1 tablet (500 mg total) by mouth 2 (two) times daily. 02/09/18  Yes Eustaquio Boyden, MD  levothyroxine (SYNTHROID, LEVOTHROID) 25 MCG tablet TAKE 1 TABLET DAILY BEFORE BREAKFAST. 02/01/18  Yes Eustaquio Boyden, MD  meclizine (ANTIVERT) 25 MG tablet Take 0.5-1 tablets (12.5-25 mg total) by mouth 2 (two) times daily as needed for dizziness. Patient taking differently: Take 25 mg by mouth 2 (two) times daily as needed for dizziness.  01/04/18  Yes Eustaquio Boyden, MD  metFORMIN (GLUCOPHAGE) 500 MG tablet TAKE 1 TABLET EVERY DAY WITH BREAKFAST 11/21/17  Yes  Eustaquio Boyden, MD  [name] 50 MG TB24 tablet TAKE 1 TABLET AT BEDTIME 11/21/17  Yes Eustaquio Boyden, MD  propranolol (INDERAL) 10 MG tablet TAKE 1 TABLET TWICE DAILY 11/21/17  Yes Eustaquio Boyden, MD  sertraline (ZOLOFT) 25 MG tablet TAKE 1 TABLET EVERY DAY 11/13/17  Yes Eustaquio Boyden, MD  Zinc Oxide (DESITIN) 13 % CREA Apply 1 application topically See admin  instructions. Apply to vaginal area four times a day   Yes [provider]  cephALEXin (KEFLEX) 500 MG capsule Take 1 capsule (500 mg total) by mouth 4 (four) times daily. 02/23/18   Sandon Yoho, Barbara Cower, MD  Cholecalciferol (VITAMIN D) 2000 units CAPS Take 1 capsule by mouth daily.    [provider]  fluconazole (DIFLUCAN) 200 MG tablet Take 1 tablet (200 mg total) by mouth daily. 02/23/18   Bianney Rockwood, Barbara Cower, MD  nystatin (MYCOSTATIN/NYSTOP) powder Apply topically 4 (four) times daily. Patient not taking: Reported on 02/22/2018 02/16/18   Excell Seltzer, MD    Family History Family History  Problem Relation Age of Onset  . Stroke Mother 48  . Pneumonia Mother   . CAD Father 38  . Alzheimer's disease Father   . Pneumonia Father   . Cancer Maternal Aunt        lung (non-smoker)  . Diabetes Neg Hx     Social History Social History   Tobacco Use  . Smoking status: Never Smoker  . Smokeless tobacco: Never Used  Substance Use Topics  . Alcohol use: No    Alcohol/week: 0.0 oz  . Drug use: No     Allergies   Patient has no known allergies.   Review of Systems Review of Systems  Respiratory: Negative for shortness of breath.   Gastrointestinal: Negative for vomiting.  Neurological: Positive for weakness. Negative for speech change and focal weakness.  Psychiatric/Behavioral: Positive for confusion.  All other systems reviewed and are negative.    Physical Exam Updated Vital Signs BP (!) 144/92   Pulse 78   Temp 98.1 F (36.7 C) (Oral)   Resp 19   SpO2 98%   Physical Exam  Constitutional: She is oriented to person, place, and time. She appears well-developed and well-nourished.  HENT:  Head: Normocephalic and atraumatic.  Eyes: Conjunctivae and EOM are normal.  Neck: Normal range of motion.  Cardiovascular: Normal rate and regular rhythm.  Pulmonary/Chest: No stridor. No respiratory distress.  Abdominal: Soft. Bowel sounds are normal. She exhibits no  distension.  Neurological: She is alert and oriented to person, place, and time. No cranial nerve deficit. Coordination normal.  Nursing note and vitals reviewed.    ED Treatments / Results  Labs (all labs ordered are listed, but only abnormal results are displayed) Labs Reviewed  COMPREHENSIVE METABOLIC PANEL - Abnormal; Notable for the following components:      Result Value   Sodium 130 (*)    Chloride 93 (*)    Glucose, Bld 106 (*)    BUN <5 (*)    All other components within normal limits  URINALYSIS, ROUTINE W REFLEX MICROSCOPIC - Abnormal; Notable for the following components:   Color, Urine STRAW (*)    Hgb urine dipstick MODERATE (*)    Leukocytes, UA TRACE (*)    Bacteria, UA RARE (*)    All other components within normal limits  CBC  I-STAT TROPONIN, ED  CBG MONITORING, ED    EKG EKG Interpretation  Date/Time:  Thursday February 22 2018 18:06:21 EDT Ventricular Rate:  72 PR Interval:  162 QRS Duration: 82 QT Interval:  412 QTC Calculation: 451 R Axis:   33 Text Interpretation:  Sinus rhythm with Possible Premature atrial complexes with Abberant conduction Otherwise normal ECG q waves in III similar to previous no other changes since 07/2016 Confirmed by Marily MemosMesner, Jocelyne Reinertsen [phone]) on 02/22/2018 7:47:25 PM   Radiology Ct Head Wo Contrast  Result Date: 02/22/2018 CLINICAL DATA:  Altered level of consciousness. Fatigue and weakness. EXAM: CT HEAD WITHOUT CONTRAST TECHNIQUE: Contiguous axial images were obtained from the base of the skull through the vertex without intravenous contrast. COMPARISON:  Brain MRI 09/20/2017.  Head CT 08/16/2016 FINDINGS: Brain: Stable degree of severe hydrocephalus with marked dilatation of the lateral and third ventricles in decompressed fourth ventricle consistent with known history of aqueductal stenosis. No intracranial hemorrhage. No extra-axial fluid collection. Parenchymal volume loss secondary to hydrocephalus, no evidence of acute  ischemia. No midline shift. Vascular: Atherosclerosis of skullbase vasculature without hyperdense vessel or abnormal calcification. Skull: No fracture or focal lesion. Sinuses/Orbits: Paranasal sinuses and mastoid air cells are clear. The visualized orbits are unremarkable. Other: None. IMPRESSION: 1.  No acute intracranial abnormality. 2. Unchanged severe hydrocephalus with known history of aqueductal stenosis. Electronically Signed   By: Rubye OaksMelanie  Ehinger M.D.   On: 02/22/2018 21:46   Dg Knee Complete 4 Views Left  Result Date: 02/22/2018 CLINICAL DATA:  Fatigue and weakness with left knee pain for 1 year EXAM: LEFT KNEE - COMPLETE 4+ VIEW COMPARISON:  None. FINDINGS: No fracture or malalignment. Mild patellofemoral degenerative changes with small knee effusion. Mild degenerative changes of the medial compartment. Coarse soft tissue calcification adjacent to the distal femur laterally. IMPRESSION: 1. No acute osseous abnormality 2. Mild degenerative changes of the knee with small knee effusion Electronically Signed   By: Jasmine PangKim  Fujinaga M.D.   On: 02/22/2018 20:49    Procedures Procedures (including critical care time)  Medications Ordered in ED Medications  lactated ringers bolus 1,000 mL (0 mLs Intravenous Stopped 02/22/18 2234)  cephALEXin (KEFLEX) capsule 500 mg (500 mg Oral Given 02/22/18 2337)     Initial Impression / Assessment and Plan / ED Course  I have reviewed the triage vital signs and the nursing notes.  Pertinent labs & imaging results that were available during my care of the patient were reviewed by me and considered in my medical decision making (see chart for details).     Pt with dementia, h/o hydrocephalus here for generalized weakness. Exam benign aside from large cranium. Is alert/oriented per baseline (per husbands report). No  Evidence of metabolic causes.  No e/o worsening hydrocephalus. Possibly UTI, will treat for same. No e/o organ damage. Will plan for discharge with  home health/PT/SW and return if not improving. Culture sent.   Final Clinical Impressions(s) / ED Diagnoses   Final diagnoses:  Weakness  Lower urinary tract infectious disease    ED Discharge Orders        Ordered    fluconazole (DIFLUCAN) 200 MG tablet  Daily,   Status:  Discontinued     02/23/18 0004    cephALEXin (KEFLEX) 500 MG capsule  4 times daily     02/23/18 0007    fluconazole (DIFLUCAN) 200 MG tablet  Daily     02/23/18 0007    Home Health     02/22/18 2318    Face-to-face encounter (required for Medicare/Medicaid patients)    Comments:  I Marily MemosJason Darian Cansler certify that this patient is under my care  and that I, or a nurse practitioner or physician's assistant working with me, had a face-to-face encounter that meets the physician face-to-face encounter requirements with this patient on 02/22/2018. The encounter with the patient was in whole, or in part for the following medical condition(s) which is the primary reason for home health care (List medical condition): knee pain, urine infection and balance issues. Further orders per primary provider.   02/22/18 2318    cephALEXin (KEFLEX) 500 MG capsule  4 times daily,   Status:  Discontinued     02/22/18 2359       Delenn Ahn, Barbara Cower, MD 02/23/18 1458

## 2018-02-23 NOTE — Telephone Encounter (Signed)
Denny Lave J. Tanaja Ganger, RN, BSN, NCM [phone] Spoke with pt at bedside regarding discharge planning for Home Health Services. Offered pt list of home health agencies to choose from.  Pt chose [facility] to render services. Ellen Williams of [facility] notified. Patient made aware that [facility] will be in contact in 24-48 hours.  No DME needs identified at this time.  

## 2018-02-26 ENCOUNTER — Other Ambulatory Visit: Payer: Self-pay | Admitting: *Deleted

## 2018-02-26 NOTE — Patient Outreach (Signed)
[facility]) Care Management  02/26/2018  [name] 1945/10/20 [id number]   Referral received from care management assistance as member was recently seen in the ED for weakness/UTI.  Per chart, she also has history of Alzheimer's, hypertension, diabetes, seizure, and hyperlipidemia.  Call placed to member's caregiver/husband to follow up on ED visit.  Member's identity verified.  This care manager introduced self and purpose of call, [facility] explained.  He report he received call from home health nurse and they will visit today to assess member and start services.  Difference between home health and [facility] explained.  He request call back later this week after evaluation from home health to assess needs.  Will follow up within the next week.  Kemper DurieMonica Coltan Spinello, [place]RN, MSN [facility] Care Management  [facility] Care [facility] [phone]

## 2018-02-27 ENCOUNTER — Other Ambulatory Visit: Payer: Self-pay | Admitting: Family Medicine

## 2018-02-27 DIAGNOSIS — E1142 Type 2 diabetes mellitus with diabetic polyneuropathy: Secondary | ICD-10-CM | POA: Diagnosis not present

## 2018-02-27 DIAGNOSIS — N3281 Overactive bladder: Secondary | ICD-10-CM | POA: Diagnosis not present

## 2018-02-27 DIAGNOSIS — E039 Hypothyroidism, unspecified: Secondary | ICD-10-CM | POA: Diagnosis not present

## 2018-02-27 DIAGNOSIS — G309 Alzheimer's disease, unspecified: Secondary | ICD-10-CM | POA: Diagnosis not present

## 2018-02-27 DIAGNOSIS — E785 Hyperlipidemia, unspecified: Secondary | ICD-10-CM | POA: Diagnosis not present

## 2018-02-27 DIAGNOSIS — I10 Essential (primary) hypertension: Secondary | ICD-10-CM | POA: Diagnosis not present

## 2018-02-27 DIAGNOSIS — I456 Pre-excitation syndrome: Secondary | ICD-10-CM | POA: Diagnosis not present

## 2018-02-27 DIAGNOSIS — E559 Vitamin D deficiency, unspecified: Secondary | ICD-10-CM | POA: Diagnosis not present

## 2018-02-27 DIAGNOSIS — F0280 Dementia in other diseases classified elsewhere without behavioral disturbance: Secondary | ICD-10-CM | POA: Diagnosis not present

## 2018-02-28 ENCOUNTER — Telehealth: Payer: Self-pay | Admitting: Family Medicine

## 2018-02-28 DIAGNOSIS — N3281 Overactive bladder: Secondary | ICD-10-CM | POA: Diagnosis not present

## 2018-02-28 DIAGNOSIS — F0280 Dementia in other diseases classified elsewhere without behavioral disturbance: Secondary | ICD-10-CM | POA: Diagnosis not present

## 2018-02-28 DIAGNOSIS — E039 Hypothyroidism, unspecified: Secondary | ICD-10-CM | POA: Diagnosis not present

## 2018-02-28 DIAGNOSIS — E559 Vitamin D deficiency, unspecified: Secondary | ICD-10-CM | POA: Diagnosis not present

## 2018-02-28 DIAGNOSIS — I456 Pre-excitation syndrome: Secondary | ICD-10-CM | POA: Diagnosis not present

## 2018-02-28 DIAGNOSIS — I10 Essential (primary) hypertension: Secondary | ICD-10-CM | POA: Diagnosis not present

## 2018-02-28 DIAGNOSIS — G309 Alzheimer's disease, unspecified: Secondary | ICD-10-CM | POA: Diagnosis not present

## 2018-02-28 DIAGNOSIS — E785 Hyperlipidemia, unspecified: Secondary | ICD-10-CM | POA: Diagnosis not present

## 2018-02-28 DIAGNOSIS — E1142 Type 2 diabetes mellitus with diabetic polyneuropathy: Secondary | ICD-10-CM | POA: Diagnosis not present

## 2018-02-28 NOTE — Telephone Encounter (Deleted)
Copied from CRM [phone]. Topic: Inquiry >> Feb 28, 2018  9:49 AM Crist InfanteHarrald, Kathy J wrote: Reason for CRM: [facility]in [facility]  1 wk 5 Ok to leave message

## 2018-02-28 NOTE — Telephone Encounter (Signed)
Copied from CRM #[id number]. Topic: Inquiry °>> Feb 28, 2018  9:49 AM Harrald, Kathy J wrote: °Reason for CRM: Justin with [facility] calling for nursing verbal orders  °1 wk 5 °Ok to leave message °

## 2018-02-28 NOTE — Telephone Encounter (Signed)
Agree with verbal orders. Thanks.

## 2018-02-28 NOTE — Telephone Encounter (Signed)
Copied from CRM [id number]. Topic: General - Other >> Feb 28, 2018  4:10 PM Percival SpanishKennedy, Cheryl W wrote:  Marchelle FolksAmanda [facility] ask for verbal orders OT 2 X 4  req Speech Therapy for Cognition   Can leave a message   [phone]

## 2018-02-28 NOTE — Telephone Encounter (Signed)
Agree with this verbal order thanks.  

## 2018-02-28 NOTE — Telephone Encounter (Signed)
Left message for [facility] informing him Dr. Reece AgarG is giving verbal order for pt for nursing.

## 2018-03-01 ENCOUNTER — Ambulatory Visit (INDEPENDENT_AMBULATORY_CARE_PROVIDER_SITE_OTHER): Payer: Medicare HMO | Admitting: Family Medicine

## 2018-03-01 ENCOUNTER — Encounter: Payer: Self-pay | Admitting: Family Medicine

## 2018-03-01 VITALS — BP 136/82 | HR 69 | Temp 98.4°F | Ht 60.0 in | Wt 137.0 lb

## 2018-03-01 DIAGNOSIS — N3000 Acute cystitis without hematuria: Secondary | ICD-10-CM | POA: Diagnosis not present

## 2018-03-01 DIAGNOSIS — G309 Alzheimer's disease, unspecified: Secondary | ICD-10-CM

## 2018-03-01 DIAGNOSIS — F0280 Dementia in other diseases classified elsewhere without behavioral disturbance: Secondary | ICD-10-CM | POA: Diagnosis not present

## 2018-03-01 DIAGNOSIS — Q039 Congenital hydrocephalus, unspecified: Secondary | ICD-10-CM

## 2018-03-01 DIAGNOSIS — M7052 Other bursitis of knee, left knee: Secondary | ICD-10-CM

## 2018-03-01 MED ORDER — ACETAMINOPHEN 500 MG PO TABS: 500.0000 mg | ORAL_TABLET | Freq: Three times a day (TID) | ORAL | 0 refills | Status: DC | PRN

## 2018-03-01 NOTE — Assessment & Plan Note (Signed)
Presumed cause of weakness leading to ER evaluation. UCx not done. Tolerating keflex and feeling better- rec finish course.

## 2018-03-01 NOTE — Telephone Encounter (Signed)
Left message on vm [facility] her Dr. Reece AgarG is giving verbal orders for OT and speech therapy for the pt.

## 2018-03-01 NOTE — Assessment & Plan Note (Signed)
Pending neuropsychological testing f/u. Will review records when available.

## 2018-03-01 NOTE — Assessment & Plan Note (Signed)
Point tender at pes anserine bursitis. rec rest, will mail handout on pes anserine bursitis exercises. rec work with PT on this.

## 2018-03-01 NOTE — Progress Notes (Signed)
BP 136/82 (BP Location: Left Arm, Patient Position: Sitting, Cuff Size: Normal)   Pulse 69   Temp 98.4 F (36.9 C) (Oral)   Ht 5' (1.524 m)   Wt 137 lb (62.1 kg)   SpO2 98%   BMI 26.76 kg/m    CC: ER f/u visit Subjective:    Patient ID: Leah Payne, female    DOB: February 01, 1946, 72 y.o.   MRN: [id number]  HPI: AMANPREET DELMONT is a 72 y.o. female presenting on 03/01/2018 for Hospitalization Follow-up (Seen at [facility] ED on 02/22/18, primary dx weakness.)   Recent ER visit for weakness associated with UTI. Culture was discussed but not ordered. Treated with keflex course as well as diflucan for candidal intertrigo (from prior to ER visit). Head CT was without acute issue, showing chronic severe hydrocephalus. L knee xray showed chronic mild OA changes.   Husband notes she sleeps a lot - stays in bed throughout the day.   [facility] involved - SN, ST, OT. Social worker also came out to house. PT pending  Dementia - has seen neurology (Dr Lucia Gaskins) and had neuropsychological testing completed - results pending. Presumed alzheimer's, aricept recently started.   Relevant past medical, surgical, family and social history reviewed and updated as indicated. Interim medical history since our last visit reviewed. Allergies and medications reviewed and updated. Outpatient Medications Prior to Visit  Medication Sig Dispense Refill  . atorvastatin (LIPITOR) 40 MG tablet TAKE 1 TABLET EVERY EVENING (Patient taking differently: Take 40 mg by mouth once a day) 90 tablet 3  . cephALEXin (KEFLEX) 500 MG capsule Take 1 capsule (500 mg total) by mouth 4 (four) times daily. 28 capsule 0  . Cholecalciferol (VITAMIN D) 2000 units CAPS Take 1 capsule by mouth daily.    Marland Kitchen donepezil (ARICEPT) 10 MG tablet Take 1 tablet daily 90 tablet 3  . fesoterodine ([name]) 8 MG TB24 tablet Take 1 tablet (8 mg total) by mouth daily. (Patient taking differently: Take 8 mg by mouth at bedtime. ) 90 tablet 1  . fluconazole  (DIFLUCAN) 200 MG tablet Take 1 tablet (200 mg total) by mouth daily. 7 tablet 0  . levETIRAcetam (KEPPRA) 500 MG tablet Take 1 tablet (500 mg total) by mouth 2 (two) times daily. 180 tablet 1  . levothyroxine (SYNTHROID, LEVOTHROID) 25 MCG tablet TAKE 1 TABLET DAILY BEFORE BREAKFAST. 90 tablet 0  . meclizine (ANTIVERT) 25 MG tablet TAKE 1/2 TO 1 TABLET TWICE DAILY AS NEEDED FOR DIZZINESS 60 tablet 1  . metFORMIN (GLUCOPHAGE) 500 MG tablet TAKE 1 TABLET EVERY DAY WITH BREAKFAST 90 tablet 3  . [name] 50 MG TB24 tablet TAKE 1 TABLET AT BEDTIME 90 tablet 3  . nystatin (MYCOSTATIN/NYSTOP) powder Apply topically 4 (four) times daily. 15 g 0  . propranolol (INDERAL) 10 MG tablet TAKE 1 TABLET TWICE DAILY 180 tablet 3  . sertraline (ZOLOFT) 25 MG tablet TAKE 1 TABLET EVERY DAY 90 tablet 1  . Zinc Oxide (DESITIN) 13 % CREA Apply 1 application topically See admin instructions. Apply to vaginal area four times a day     No facility-administered medications prior to visit.      Per HPI unless specifically indicated in ROS section below Review of Systems     Objective:    BP 136/82 (BP Location: Left Arm, Patient Position: Sitting, Cuff Size: Normal)   Pulse 69   Temp 98.4 F (36.9 C) (Oral)   Ht 5' (1.524 m)   Wt 137  lb (62.1 kg)   SpO2 98%   BMI 26.76 kg/m   Wt Readings from Last 3 Encounters:  03/01/18 137 lb (62.1 kg)  02/16/18 143 lb (64.9 kg)  10/20/17 148 lb (67.1 kg)    Physical Exam  Constitutional: She appears well-developed and well-nourished. No distress.  In wheelchair today  HENT:  Mouth/Throat: Oropharynx is clear and moist. No oropharyngeal exudate.  Eyes:  Cataracts bilaterally Strabismus present  Cardiovascular: Normal rate, regular rhythm and normal heart sounds.  No murmur heard. Pulmonary/Chest: Effort normal and breath sounds normal. No respiratory distress. She has no wheezes. She has no rales.  Musculoskeletal: She exhibits no edema.  FROM bilateral knees  except for limited at full extension on left No pain along knee joint, point tender to palpation at left pes anserine bursa  Nursing note and vitals reviewed.  Results for orders placed or performed during the hospital encounter of 02/22/18  Comprehensive metabolic panel  Result Value Ref Range   Sodium 130 (L) 135 - 145 mmol/L   Potassium 4.2 3.5 - 5.1 mmol/L   Chloride 93 (L) 98 - 111 mmol/L   CO2 25 22 - 32 mmol/L   Glucose, Bld 106 (H) 70 - 99 mg/dL   BUN <5 (L) 8 - 23 mg/dL   Creatinine, Ser 9.56 0.44 - 1.00 mg/dL   Calcium 9.2 8.9 - 21.3 mg/dL   Total Protein 7.2 6.5 - 8.1 g/dL   Albumin 3.7 3.5 - 5.0 g/dL   AST 20 15 - 41 U/L   ALT 17 0 - 44 U/L   Alkaline Phosphatase 78 38 - 126 U/L   Total Bilirubin 0.6 0.3 - 1.2 mg/dL   GFR calc non Af Amer >60 >60 mL/min   GFR calc Af Amer >60 >60 mL/min   Anion gap 12 5 - 15  CBC  Result Value Ref Range   WBC 6.2 4.0 - 10.5 K/uL   RBC 4.48 3.87 - 5.11 MIL/uL   Hemoglobin 12.6 12.0 - 15.0 g/dL   HCT 08.6 57.8 - 46.9 %   MCV 84.8 78.0 - 100.0 fL   MCH 28.1 26.0 - 34.0 pg   MCHC 33.2 30.0 - 36.0 g/dL   RDW 62.9 52.8 - 41.3 %   Platelets 325 150 - 400 K/uL  Urinalysis, Routine w reflex microscopic  Result Value Ref Range   Color, Urine STRAW (A) YELLOW   APPearance CLEAR CLEAR   Specific Gravity, Urine 1.005 1.005 - 1.030   pH 6.0 5.0 - 8.0   Glucose, UA NEGATIVE NEGATIVE mg/dL   Hgb urine dipstick MODERATE (A) NEGATIVE   Bilirubin Urine NEGATIVE NEGATIVE   Ketones, ur NEGATIVE NEGATIVE mg/dL   Protein, ur NEGATIVE NEGATIVE mg/dL   Nitrite NEGATIVE NEGATIVE   Leukocytes, UA TRACE (A) NEGATIVE   WBC, UA 0-5 0 - 5 WBC/hpf   Bacteria, UA RARE (A) NONE SEEN  I-Stat Troponin, ED (not at [facility])  Result Value Ref Range   Troponin i, poc 0.01 0.00 - 0.08 ng/mL   Comment 3          CBG monitoring, ED  Result Value Ref Range   Glucose-Capillary 92 70 - 99 mg/dL   Comment 1 Notify RN    Comment 2 Document in Chart         Assessment & Plan:   Problem List Items Addressed This Visit    UTI (urinary tract infection) - Primary    Presumed cause of weakness leading to  ER evaluation. UCx not done. Tolerating keflex and feeling better- rec finish course.       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HCC)   Bursitis of left knee    Point tender at pes anserine bursitis. rec rest, will mail handout on pes anserine bursitis exercises. rec work with PT on this.       Relevant Medications   acetaminophen (TYLENOL) 500 MG tablet   Alzheimer disease    Pending neuropsychological testing f/u. Will review records when available.           Meds ordered this encounter  Medications  . acetaminophen (TYLENOL) 500 MG tablet    Sig: Take 1 tablet (500 mg total) by mouth every 8 (eight) hours as needed.    Refill:  0   No orders of the defined types were placed in this encounter.   Follow up plan: Return in about 3 months (around 06/01/2018) for follow up visit.  Eustaquio BoydenJavier Placida Cambre, MD

## 2018-03-01 NOTE — Patient Instructions (Addendum)
I think you have left knee bursitis - do exercises provided today.  Finish antibiotic, let us know if new urinary symptoms develop.  Keep follow with Dr Miachel RouxBaliar Return in 3 months for follow up visit.

## 2018-03-02 ENCOUNTER — Other Ambulatory Visit: Payer: Self-pay | Admitting: *Deleted

## 2018-03-02 DIAGNOSIS — E039 Hypothyroidism, unspecified: Secondary | ICD-10-CM | POA: Diagnosis not present

## 2018-03-02 DIAGNOSIS — I10 Essential (primary) hypertension: Secondary | ICD-10-CM | POA: Diagnosis not present

## 2018-03-02 DIAGNOSIS — E559 Vitamin D deficiency, unspecified: Secondary | ICD-10-CM | POA: Diagnosis not present

## 2018-03-02 DIAGNOSIS — F0280 Dementia in other diseases classified elsewhere without behavioral disturbance: Secondary | ICD-10-CM | POA: Diagnosis not present

## 2018-03-02 DIAGNOSIS — I456 Pre-excitation syndrome: Secondary | ICD-10-CM | POA: Diagnosis not present

## 2018-03-02 DIAGNOSIS — G309 Alzheimer's disease, unspecified: Secondary | ICD-10-CM | POA: Diagnosis not present

## 2018-03-02 DIAGNOSIS — E785 Hyperlipidemia, unspecified: Secondary | ICD-10-CM | POA: Diagnosis not present

## 2018-03-02 DIAGNOSIS — N3281 Overactive bladder: Secondary | ICD-10-CM | POA: Diagnosis not present

## 2018-03-02 DIAGNOSIS — E1142 Type 2 diabetes mellitus with diabetic polyneuropathy: Secondary | ICD-10-CM | POA: Diagnosis not present

## 2018-03-02 NOTE — Patient Outreach (Signed)
[facility] Care Management  03/02/2018  Lake BellsMargaret L Murillo [birth year] [id number]   Call placed to member/husband to follow up on decision for [facility], he report he feels member has enough support at this time and wouldn't want member to be overwhelmed.  Advised to contact this care manager if needs should change or if further assistance is needed once home health is complete.  Will close case at this time and notify primary MD.  Kemper DurieMonica Harlem Bula, RN, MSN [facility] Care [facility]  [facility] [phone]

## 2018-03-05 DIAGNOSIS — E039 Hypothyroidism, unspecified: Secondary | ICD-10-CM | POA: Diagnosis not present

## 2018-03-05 DIAGNOSIS — G309 Alzheimer's disease, unspecified: Secondary | ICD-10-CM | POA: Diagnosis not present

## 2018-03-05 DIAGNOSIS — I456 Pre-excitation syndrome: Secondary | ICD-10-CM | POA: Diagnosis not present

## 2018-03-05 DIAGNOSIS — E1142 Type 2 diabetes mellitus with diabetic polyneuropathy: Secondary | ICD-10-CM | POA: Diagnosis not present

## 2018-03-05 DIAGNOSIS — E559 Vitamin D deficiency, unspecified: Secondary | ICD-10-CM | POA: Diagnosis not present

## 2018-03-05 DIAGNOSIS — N3281 Overactive bladder: Secondary | ICD-10-CM | POA: Diagnosis not present

## 2018-03-05 DIAGNOSIS — I10 Essential (primary) hypertension: Secondary | ICD-10-CM | POA: Diagnosis not present

## 2018-03-05 DIAGNOSIS — F0280 Dementia in other diseases classified elsewhere without behavioral disturbance: Secondary | ICD-10-CM | POA: Diagnosis not present

## 2018-03-05 DIAGNOSIS — E785 Hyperlipidemia, unspecified: Secondary | ICD-10-CM | POA: Diagnosis not present

## 2018-03-07 ENCOUNTER — Telehealth: Payer: Self-pay | Admitting: Family Medicine

## 2018-03-07 ENCOUNTER — Encounter: Payer: Self-pay | Admitting: Internal Medicine

## 2018-03-07 ENCOUNTER — Ambulatory Visit (INDEPENDENT_AMBULATORY_CARE_PROVIDER_SITE_OTHER): Payer: Medicare HMO | Admitting: Internal Medicine

## 2018-03-07 VITALS — BP 128/68 | HR 72 | Temp 97.9°F

## 2018-03-07 DIAGNOSIS — R21 Rash and other nonspecific skin eruption: Secondary | ICD-10-CM | POA: Diagnosis not present

## 2018-03-07 DIAGNOSIS — E039 Hypothyroidism, unspecified: Secondary | ICD-10-CM | POA: Diagnosis not present

## 2018-03-07 DIAGNOSIS — E785 Hyperlipidemia, unspecified: Secondary | ICD-10-CM | POA: Diagnosis not present

## 2018-03-07 DIAGNOSIS — N3000 Acute cystitis without hematuria: Secondary | ICD-10-CM | POA: Diagnosis not present

## 2018-03-07 DIAGNOSIS — I456 Pre-excitation syndrome: Secondary | ICD-10-CM | POA: Diagnosis not present

## 2018-03-07 DIAGNOSIS — R300 Dysuria: Secondary | ICD-10-CM | POA: Diagnosis not present

## 2018-03-07 DIAGNOSIS — N3281 Overactive bladder: Secondary | ICD-10-CM | POA: Diagnosis not present

## 2018-03-07 DIAGNOSIS — F0280 Dementia in other diseases classified elsewhere without behavioral disturbance: Secondary | ICD-10-CM | POA: Diagnosis not present

## 2018-03-07 DIAGNOSIS — E1142 Type 2 diabetes mellitus with diabetic polyneuropathy: Secondary | ICD-10-CM | POA: Diagnosis not present

## 2018-03-07 DIAGNOSIS — G309 Alzheimer's disease, unspecified: Secondary | ICD-10-CM | POA: Diagnosis not present

## 2018-03-07 DIAGNOSIS — B372 Candidiasis of skin and nail: Secondary | ICD-10-CM | POA: Diagnosis not present

## 2018-03-07 DIAGNOSIS — E559 Vitamin D deficiency, unspecified: Secondary | ICD-10-CM | POA: Diagnosis not present

## 2018-03-07 DIAGNOSIS — I10 Essential (primary) hypertension: Secondary | ICD-10-CM | POA: Diagnosis not present

## 2018-03-07 LAB — POC URINALSYSI DIPSTICK (AUTOMATED)
Bilirubin, UA: NEGATIVE
Blood, UA: NEGATIVE
Glucose, UA: NEGATIVE
Ketones, UA: NEGATIVE
Leukocytes, UA: NEGATIVE
Nitrite, UA: NEGATIVE
Protein, UA: NEGATIVE
Spec Grav, UA: 1.01 (ref 1.010–1.025)
Urobilinogen, UA: 0.2 E.U./dL
pH, UA: 6 (ref 5.0–8.0)

## 2018-03-07 MED ORDER — TRIAMCINOLONE ACETONIDE 0.1 % EX CREA: 1.0000 "application " | TOPICAL_CREAM | Freq: Two times a day (BID) | CUTANEOUS | 0 refills | Status: DC

## 2018-03-07 MED ORDER — NYSTATIN 100000 UNIT/GM EX POWD: Freq: Four times a day (QID) | CUTANEOUS | 1 refills | Status: DC

## 2018-03-07 NOTE — Telephone Encounter (Signed)
[facility] informing her Dr. Reece AgarG is giving verbal order for ST for pt.  Verbalizes understanding.

## 2018-03-07 NOTE — Telephone Encounter (Signed)
Copied from CRM [phone]. Topic: Quick Communication - See Telephone Encounter >> Mar 07, 2018 10:27 AM Lorayne BenderAlexander, Amber L wrote: CRM for notification. See Telephone encounter for: 03/07/18.  [facility] calling to get verbal orders: ST for cognitive communication deficit related to dementia - twice a week for 4 weeks  Menda can be reached at [phone]

## 2018-03-07 NOTE — Progress Notes (Signed)
Subjective:    Patient ID: BRYNNE DOANE, female    DOB: 1946/05/28, 72 y.o.   MRN: [id number]  HPI  Pt presents to the clinic today with multiple complaints.   1- She reports rash to bilateral forearms. She notice this 3 days ago. The rash itches. She has not come in contact with anything that she is allergic to.  No one in her home has a similar rash.  She has not tried anything over-the-counter.  2-she also wants to check her urine today.  She was recently hospitalized for urinary tract infection.  She denies urgency, frequency, burning or blood in the urine.  She denies vaginal discharge, odor.  She has not tried anything over-the-counter for her symptoms.  Review of Systems  Past Medical History:  Diagnosis Date  . Anomaly of cranium    frontal bossing, enlarged cranium, adult strabismus  . Diabetes mellitus type 2, controlled (HCC) 08/04/2013  . Heart disease   . History of chicken pox   . History of fainting   . 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 Hyperlipidemia 08/04/2013  . Hyponatremia 08/29/2014  . Preaxial polydactyly of right hand congenital  . Seizure disorder (HCC) 08/04/2013  . Tachycardia   . WPW (Wolff-Parkinson-White syndrome)    has seen Dr Donnie Aho    Current Outpatient Medications  Medication Sig Dispense Refill  . acetaminophen (TYLENOL) 500 MG tablet Take 1 tablet (500 mg total) by mouth every 8 (eight) hours as needed.  0  . atorvastatin (LIPITOR) 40 MG tablet TAKE 1 TABLET EVERY EVENING (Patient taking differently: Take 40 mg by mouth once a day) 90 tablet 3  . Cholecalciferol (VITAMIN D) 2000 units CAPS Take 1 capsule by mouth daily.    Marland Kitchen donepezil (ARICEPT) 10 MG tablet Take 1 tablet daily 90 tablet 3  . fesoterodine ([name]) 8 MG TB24 tablet Take 1 tablet (8 mg total) by mouth  daily. (Patient taking differently: Take 8 mg by mouth at bedtime. ) 90 tablet 1  . fluconazole (DIFLUCAN) 200 MG tablet Take 1 tablet (200 mg total) by mouth daily. 7 tablet 0  . levETIRAcetam (KEPPRA) 500 MG tablet Take 1 tablet (500 mg total) by mouth 2 (two) times daily. 180 tablet 1  . levothyroxine (SYNTHROID, LEVOTHROID) 25 MCG tablet TAKE 1 TABLET DAILY BEFORE BREAKFAST. 90 tablet 0  . meclizine (ANTIVERT) 25 MG tablet TAKE 1/2 TO 1 TABLET TWICE DAILY AS NEEDED FOR DIZZINESS 60 tablet 1  . metFORMIN (GLUCOPHAGE) 500 MG tablet TAKE 1 TABLET EVERY DAY WITH BREAKFAST 90 tablet 3  . [name] 50 MG TB24 tablet TAKE 1 TABLET AT BEDTIME 90 tablet 3  . propranolol (INDERAL) 10 MG tablet TAKE 1 TABLET TWICE DAILY 180 tablet 3  . sertraline (ZOLOFT) 25 MG tablet TAKE 1 TABLET EVERY DAY 90 tablet 1  . Zinc Oxide (DESITIN) 13 % CREA Apply 1 application topically See admin instructions. Apply to vaginal area four times a day    . nystatin (MYCOSTATIN/NYSTOP) powder Apply topically 4 (four) times daily. 30 g 1  . triamcinolone cream (KENALOG) 0.1 % Apply 1 application topically 2 (two) times daily. 30 g 0   No current facility-administered medications for this visit.     No Known Allergies  Family History  Problem Relation Age of Onset  . Stroke Mother 79  .  Pneumonia Mother   . CAD Father 3979  . Alzheimer's disease Father   . Pneumonia Father   . Cancer Maternal Aunt        lung (non-smoker)  . Diabetes Neg Hx     Social History   Socioeconomic History  . Marital status: Married    Spouse name: Not on file  . Number of children: Not on file  . Years of education: Not on file  . Highest education level: Not on file  Occupational History  . Not on file  Social Needs  . Financial resource strain: Not on file  . Food insecurity:    Worry: Not on file    Inability: Not on file  . Transportation needs:    Medical: Not on file    Non-medical: Not on file  Tobacco Use  . Smoking  status: Never Smoker  . Smokeless tobacco: Never Used  Substance and Sexual Activity  . Alcohol use: No    Alcohol/week: 0.0 standard drinks  . Drug use: No  . Sexual activity: Not on file  Lifestyle  . Physical activity:    Days per week: Not on file    Minutes per session: Not on file  . Stress: Not on file  Relationships  . Social connections:    Talks on phone: Not on file    Gets together: Not on file    Attends religious service: Not on file    Active member of club or organization: Not on file    Attends meetings of clubs or organizations: Not on file    Relationship status: Not on file  . Intimate partner violence:    Fear of current or ex partner: Not on file    Emotionally abused: Not on file    Physically abused: Not on file    Forced sexual activity: Not on file  Other Topics Concern  . Not on file  Social History Narrative   Lives with husband, 2 dogs   [place]   Occupation: retired, was housewife   Edu: completed HS    Activity: no regular exercise. Dizziness precludes activity   Diet: good water, fruits/vegetables daily   Caffeine: 2 cups daily     Constitutional: Denies fever, malaise, fatigue, headache or abrupt weight changes.  Gastrointestinal: Denies abdominal pain, bloating, constipation, diarrhea or blood in the stool.  GU: Denies urgency, frequency, pain with urination, burning sensation, blood in urine, odor or discharge. Skin: Pt reports rash to bilateral forearms. Denies ulcercations.    No other specific complaints in a complete review of systems (except as listed in HPI above).     Objective:   Physical Exam   BP 128/68   Pulse 72   Temp 97.9 F (36.6 C) (Oral)   SpO2 96%  Wt Readings from Last 3 Encounters:  03/01/18 137 lb (62.1 kg)  02/16/18 143 lb (64.9 kg)  10/20/17 148 lb (67.1 kg)    General: Appears her stated age, well developed, well nourished in NAD. Skin: Yeast infection noted of bilateral groin  extending to bilateral thighs. Scattered macular rash noted of bilateral wrist with excoriation noted. Abdomen: Soft and nontender. Normal bowel sounds. No distention or masses noted. No CVA tenderness noted.    BMET    Component Value Date/Time   NA 130 (L) 02/22/2018 1811   K 4.2 02/22/2018 1811   CL 93 (L) 02/22/2018 1811   CO2 25 02/22/2018 1811   GLUCOSE 106 (H) 02/22/2018  1811   BUN <5 (L) 02/22/2018 1811   CREATININE 0.60 02/22/2018 1811   CALCIUM 9.2 02/22/2018 1811   GFRNONAA >60 02/22/2018 1811   GFRAA >60 02/22/2018 1811    Lipid Panel     Component Value Date/Time   CHOL 128 09/29/2017 0911   CHOL 146 03/27/2015   TRIG 89.0 09/29/2017 0911   TRIG 215 03/27/2015   HDL 52.60 09/29/2017 0911   CHOLHDL 2 09/29/2017 0911   VLDL 17.8 09/29/2017 0911   LDLCALC 57 09/29/2017 0911   LDLCALC 101 03/27/2015    CBC    Component Value Date/Time   WBC 6.2 02/22/2018 1811   RBC 4.48 02/22/2018 1811   HGB 12.6 02/22/2018 1811   HGB 13.8 12/11/2014   HCT 38.0 02/22/2018 1811   PLT 325 02/22/2018 1811   MCV 84.8 02/22/2018 1811   MCH 28.1 02/22/2018 1811   MCHC 33.2 02/22/2018 1811   RDW 11.9 02/22/2018 1811   LYMPHSABS 2.2 09/12/2017 1037   MONOABS 0.8 09/12/2017 1037   EOSABS 0.1 09/12/2017 1037   BASOSABS 0.1 09/12/2017 1037    Hgb A1C Lab Results  Component Value Date   HGBA1C 6.1 09/29/2017           Assessment & Plan:   Rash:  eRx for Triamcinolone cream BID until resolved  Candida Intertrigo:  eRx for Nystatin powder Follow up with PCP in 1 week for reeval  Recent UTI:  Urinalysis normal Will send urine culture  Will follow up after urine culture, return precautions discussed Nicki Reaperegina Demita Tobia, NP

## 2018-03-07 NOTE — Telephone Encounter (Signed)
Agree with this. Thanks.  

## 2018-03-07 NOTE — Telephone Encounter (Signed)
Spoke with pt's husband, Zollie BeckersWalter (on dpr), relaying Dr. Timoteo ExposeG's message.  States pt was seen by Nicki Reaperegina Baity today.

## 2018-03-07 NOTE — Patient Instructions (Signed)

## 2018-03-07 NOTE — Telephone Encounter (Signed)
Spouse came in today wanting to know if you would refill  Cephalexin 500mg   Capsule Take 1 capsule by mouth four times daily  Fluconazole 200 mg tablet Take 1 table by mouth daily [facility] [address] [place]  He stated she is still burning when going to bathroom and red rash.  Offered to make appointment he wanted to see if you would refill before making appointment

## 2018-03-07 NOTE — Telephone Encounter (Signed)
Do recommend re evaluation with office visit as will need to recheck urine and send culture and re exam.

## 2018-03-08 DIAGNOSIS — I10 Essential (primary) hypertension: Secondary | ICD-10-CM | POA: Diagnosis not present

## 2018-03-08 DIAGNOSIS — I456 Pre-excitation syndrome: Secondary | ICD-10-CM | POA: Diagnosis not present

## 2018-03-08 DIAGNOSIS — E1142 Type 2 diabetes mellitus with diabetic polyneuropathy: Secondary | ICD-10-CM | POA: Diagnosis not present

## 2018-03-08 DIAGNOSIS — N3281 Overactive bladder: Secondary | ICD-10-CM | POA: Diagnosis not present

## 2018-03-08 DIAGNOSIS — E039 Hypothyroidism, unspecified: Secondary | ICD-10-CM | POA: Diagnosis not present

## 2018-03-08 DIAGNOSIS — F0280 Dementia in other diseases classified elsewhere without behavioral disturbance: Secondary | ICD-10-CM | POA: Diagnosis not present

## 2018-03-08 DIAGNOSIS — E559 Vitamin D deficiency, unspecified: Secondary | ICD-10-CM | POA: Diagnosis not present

## 2018-03-08 DIAGNOSIS — G309 Alzheimer's disease, unspecified: Secondary | ICD-10-CM | POA: Diagnosis not present

## 2018-03-08 DIAGNOSIS — E785 Hyperlipidemia, unspecified: Secondary | ICD-10-CM | POA: Diagnosis not present

## 2018-03-08 LAB — URINE CULTURE
MICRO NUMBER:: 90965763
SPECIMEN QUALITY:: ADEQUATE

## 2018-03-09 ENCOUNTER — Telehealth: Payer: Self-pay

## 2018-03-09 DIAGNOSIS — I456 Pre-excitation syndrome: Secondary | ICD-10-CM | POA: Diagnosis not present

## 2018-03-09 DIAGNOSIS — E039 Hypothyroidism, unspecified: Secondary | ICD-10-CM | POA: Diagnosis not present

## 2018-03-09 DIAGNOSIS — F0280 Dementia in other diseases classified elsewhere without behavioral disturbance: Secondary | ICD-10-CM | POA: Diagnosis not present

## 2018-03-09 DIAGNOSIS — E785 Hyperlipidemia, unspecified: Secondary | ICD-10-CM | POA: Diagnosis not present

## 2018-03-09 DIAGNOSIS — G309 Alzheimer's disease, unspecified: Secondary | ICD-10-CM | POA: Diagnosis not present

## 2018-03-09 DIAGNOSIS — E559 Vitamin D deficiency, unspecified: Secondary | ICD-10-CM | POA: Diagnosis not present

## 2018-03-09 DIAGNOSIS — I10 Essential (primary) hypertension: Secondary | ICD-10-CM | POA: Diagnosis not present

## 2018-03-09 DIAGNOSIS — N3281 Overactive bladder: Secondary | ICD-10-CM | POA: Diagnosis not present

## 2018-03-09 DIAGNOSIS — E1142 Type 2 diabetes mellitus with diabetic polyneuropathy: Secondary | ICD-10-CM | POA: Diagnosis not present

## 2018-03-09 MED ORDER — FLUCONAZOLE 150 MG PO TABS: 150.0000 mg | ORAL_TABLET | ORAL | 0 refills | Status: DC

## 2018-03-09 NOTE — Telephone Encounter (Signed)
Spoke with Grant [facility]Lavelle [facility] relaying Dr. Timoteo ExposeG's message and instructions and relaying lab results. Verbalizes understanding and expresses her thanks for the call back.

## 2018-03-09 NOTE — Telephone Encounter (Signed)
Copied from CRM [phone]. Topic: General - Other >> Mar 09, 2018  4:33 PM Marylen PontoMcneil, Ja-Kwan wrote: Reason for CRM: Lavalle with [facility] requests Rx for Diflucan as it will be easier for pt to manage instead of the powder and cream that was recently prescribed. Cb# [phone]

## 2018-03-09 NOTE — Telephone Encounter (Signed)
Nicki Reaperegina Baity NP is out of office.Please advise.

## 2018-03-09 NOTE — Telephone Encounter (Signed)
plz notify - diflucan oral antifungal sent to pharmacy to take once weekly for 4 weeks.  Also notify urine culture returned normal.

## 2018-03-13 DIAGNOSIS — I456 Pre-excitation syndrome: Secondary | ICD-10-CM | POA: Diagnosis not present

## 2018-03-13 DIAGNOSIS — N3281 Overactive bladder: Secondary | ICD-10-CM | POA: Diagnosis not present

## 2018-03-13 DIAGNOSIS — I10 Essential (primary) hypertension: Secondary | ICD-10-CM | POA: Diagnosis not present

## 2018-03-13 DIAGNOSIS — G309 Alzheimer's disease, unspecified: Secondary | ICD-10-CM | POA: Diagnosis not present

## 2018-03-13 DIAGNOSIS — E1142 Type 2 diabetes mellitus with diabetic polyneuropathy: Secondary | ICD-10-CM | POA: Diagnosis not present

## 2018-03-13 DIAGNOSIS — F0280 Dementia in other diseases classified elsewhere without behavioral disturbance: Secondary | ICD-10-CM | POA: Diagnosis not present

## 2018-03-13 DIAGNOSIS — E039 Hypothyroidism, unspecified: Secondary | ICD-10-CM | POA: Diagnosis not present

## 2018-03-13 DIAGNOSIS — E785 Hyperlipidemia, unspecified: Secondary | ICD-10-CM | POA: Diagnosis not present

## 2018-03-13 DIAGNOSIS — E559 Vitamin D deficiency, unspecified: Secondary | ICD-10-CM | POA: Diagnosis not present

## 2018-03-14 DIAGNOSIS — F0280 Dementia in other diseases classified elsewhere without behavioral disturbance: Secondary | ICD-10-CM | POA: Diagnosis not present

## 2018-03-14 DIAGNOSIS — N3281 Overactive bladder: Secondary | ICD-10-CM | POA: Diagnosis not present

## 2018-03-14 DIAGNOSIS — I456 Pre-excitation syndrome: Secondary | ICD-10-CM | POA: Diagnosis not present

## 2018-03-14 DIAGNOSIS — E1142 Type 2 diabetes mellitus with diabetic polyneuropathy: Secondary | ICD-10-CM | POA: Diagnosis not present

## 2018-03-14 DIAGNOSIS — E785 Hyperlipidemia, unspecified: Secondary | ICD-10-CM | POA: Diagnosis not present

## 2018-03-14 DIAGNOSIS — E039 Hypothyroidism, unspecified: Secondary | ICD-10-CM | POA: Diagnosis not present

## 2018-03-14 DIAGNOSIS — G309 Alzheimer's disease, unspecified: Secondary | ICD-10-CM | POA: Diagnosis not present

## 2018-03-14 DIAGNOSIS — I10 Essential (primary) hypertension: Secondary | ICD-10-CM | POA: Diagnosis not present

## 2018-03-14 DIAGNOSIS — E559 Vitamin D deficiency, unspecified: Secondary | ICD-10-CM | POA: Diagnosis not present

## 2018-03-15 DIAGNOSIS — E039 Hypothyroidism, unspecified: Secondary | ICD-10-CM | POA: Diagnosis not present

## 2018-03-15 DIAGNOSIS — E785 Hyperlipidemia, unspecified: Secondary | ICD-10-CM | POA: Diagnosis not present

## 2018-03-15 DIAGNOSIS — I456 Pre-excitation syndrome: Secondary | ICD-10-CM | POA: Diagnosis not present

## 2018-03-15 DIAGNOSIS — N3281 Overactive bladder: Secondary | ICD-10-CM | POA: Diagnosis not present

## 2018-03-15 DIAGNOSIS — E1142 Type 2 diabetes mellitus with diabetic polyneuropathy: Secondary | ICD-10-CM | POA: Diagnosis not present

## 2018-03-15 DIAGNOSIS — I10 Essential (primary) hypertension: Secondary | ICD-10-CM | POA: Diagnosis not present

## 2018-03-15 DIAGNOSIS — E559 Vitamin D deficiency, unspecified: Secondary | ICD-10-CM | POA: Diagnosis not present

## 2018-03-15 DIAGNOSIS — F0280 Dementia in other diseases classified elsewhere without behavioral disturbance: Secondary | ICD-10-CM | POA: Diagnosis not present

## 2018-03-15 DIAGNOSIS — G309 Alzheimer's disease, unspecified: Secondary | ICD-10-CM | POA: Diagnosis not present

## 2018-03-16 ENCOUNTER — Telehealth: Payer: Self-pay

## 2018-03-16 DIAGNOSIS — F0280 Dementia in other diseases classified elsewhere without behavioral disturbance: Secondary | ICD-10-CM | POA: Diagnosis not present

## 2018-03-16 DIAGNOSIS — E1142 Type 2 diabetes mellitus with diabetic polyneuropathy: Secondary | ICD-10-CM | POA: Diagnosis not present

## 2018-03-16 DIAGNOSIS — G309 Alzheimer's disease, unspecified: Secondary | ICD-10-CM | POA: Diagnosis not present

## 2018-03-16 DIAGNOSIS — N3281 Overactive bladder: Secondary | ICD-10-CM | POA: Diagnosis not present

## 2018-03-16 DIAGNOSIS — E039 Hypothyroidism, unspecified: Secondary | ICD-10-CM | POA: Diagnosis not present

## 2018-03-16 DIAGNOSIS — E559 Vitamin D deficiency, unspecified: Secondary | ICD-10-CM | POA: Diagnosis not present

## 2018-03-16 DIAGNOSIS — I10 Essential (primary) hypertension: Secondary | ICD-10-CM | POA: Diagnosis not present

## 2018-03-16 DIAGNOSIS — E785 Hyperlipidemia, unspecified: Secondary | ICD-10-CM | POA: Diagnosis not present

## 2018-03-16 DIAGNOSIS — I456 Pre-excitation syndrome: Secondary | ICD-10-CM | POA: Diagnosis not present

## 2018-03-16 NOTE — Telephone Encounter (Signed)
Agree with this. Thank you.  

## 2018-03-16 NOTE — Telephone Encounter (Signed)
Left message on vm for Leah [facility] informing her Dr. Reece AgarG is giving verbal orders for the transfer tub.

## 2018-03-16 NOTE — Telephone Encounter (Signed)
Copied from CRM #[id number]. Topic: General - Other >> Mar 16, 2018 11:46 AM Harlan StainsWilliams-Neal, Sade R wrote: Park Meoeleste from [facility] is calling in for Verbal Orders for a Tub Advertising copywriterTransfer Bench.  CB# [phone] may leave a detailed message

## 2018-03-20 ENCOUNTER — Encounter: Payer: Medicare HMO | Admitting: Psychology

## 2018-03-20 DIAGNOSIS — N3281 Overactive bladder: Secondary | ICD-10-CM | POA: Diagnosis not present

## 2018-03-20 DIAGNOSIS — E559 Vitamin D deficiency, unspecified: Secondary | ICD-10-CM | POA: Diagnosis not present

## 2018-03-20 DIAGNOSIS — G309 Alzheimer's disease, unspecified: Secondary | ICD-10-CM | POA: Diagnosis not present

## 2018-03-20 DIAGNOSIS — F0280 Dementia in other diseases classified elsewhere without behavioral disturbance: Secondary | ICD-10-CM | POA: Diagnosis not present

## 2018-03-20 DIAGNOSIS — E785 Hyperlipidemia, unspecified: Secondary | ICD-10-CM | POA: Diagnosis not present

## 2018-03-20 DIAGNOSIS — I10 Essential (primary) hypertension: Secondary | ICD-10-CM | POA: Diagnosis not present

## 2018-03-20 DIAGNOSIS — E1142 Type 2 diabetes mellitus with diabetic polyneuropathy: Secondary | ICD-10-CM | POA: Diagnosis not present

## 2018-03-20 DIAGNOSIS — I456 Pre-excitation syndrome: Secondary | ICD-10-CM | POA: Diagnosis not present

## 2018-03-20 DIAGNOSIS — E039 Hypothyroidism, unspecified: Secondary | ICD-10-CM | POA: Diagnosis not present

## 2018-03-21 DIAGNOSIS — N3281 Overactive bladder: Secondary | ICD-10-CM | POA: Diagnosis not present

## 2018-03-21 DIAGNOSIS — E785 Hyperlipidemia, unspecified: Secondary | ICD-10-CM | POA: Diagnosis not present

## 2018-03-21 DIAGNOSIS — E039 Hypothyroidism, unspecified: Secondary | ICD-10-CM | POA: Diagnosis not present

## 2018-03-21 DIAGNOSIS — E1142 Type 2 diabetes mellitus with diabetic polyneuropathy: Secondary | ICD-10-CM | POA: Diagnosis not present

## 2018-03-21 DIAGNOSIS — F0280 Dementia in other diseases classified elsewhere without behavioral disturbance: Secondary | ICD-10-CM | POA: Diagnosis not present

## 2018-03-21 DIAGNOSIS — I10 Essential (primary) hypertension: Secondary | ICD-10-CM | POA: Diagnosis not present

## 2018-03-21 DIAGNOSIS — I456 Pre-excitation syndrome: Secondary | ICD-10-CM | POA: Diagnosis not present

## 2018-03-21 DIAGNOSIS — E559 Vitamin D deficiency, unspecified: Secondary | ICD-10-CM | POA: Diagnosis not present

## 2018-03-21 DIAGNOSIS — G309 Alzheimer's disease, unspecified: Secondary | ICD-10-CM | POA: Diagnosis not present

## 2018-03-22 DIAGNOSIS — E559 Vitamin D deficiency, unspecified: Secondary | ICD-10-CM | POA: Diagnosis not present

## 2018-03-22 DIAGNOSIS — G309 Alzheimer's disease, unspecified: Secondary | ICD-10-CM | POA: Diagnosis not present

## 2018-03-22 DIAGNOSIS — I456 Pre-excitation syndrome: Secondary | ICD-10-CM | POA: Diagnosis not present

## 2018-03-22 DIAGNOSIS — E039 Hypothyroidism, unspecified: Secondary | ICD-10-CM | POA: Diagnosis not present

## 2018-03-22 DIAGNOSIS — E785 Hyperlipidemia, unspecified: Secondary | ICD-10-CM | POA: Diagnosis not present

## 2018-03-22 DIAGNOSIS — E1142 Type 2 diabetes mellitus with diabetic polyneuropathy: Secondary | ICD-10-CM | POA: Diagnosis not present

## 2018-03-22 DIAGNOSIS — F0280 Dementia in other diseases classified elsewhere without behavioral disturbance: Secondary | ICD-10-CM | POA: Diagnosis not present

## 2018-03-22 DIAGNOSIS — I10 Essential (primary) hypertension: Secondary | ICD-10-CM | POA: Diagnosis not present

## 2018-03-22 DIAGNOSIS — N3281 Overactive bladder: Secondary | ICD-10-CM | POA: Diagnosis not present

## 2018-03-23 DIAGNOSIS — E1142 Type 2 diabetes mellitus with diabetic polyneuropathy: Secondary | ICD-10-CM | POA: Diagnosis not present

## 2018-03-23 DIAGNOSIS — E039 Hypothyroidism, unspecified: Secondary | ICD-10-CM | POA: Diagnosis not present

## 2018-03-23 DIAGNOSIS — I10 Essential (primary) hypertension: Secondary | ICD-10-CM | POA: Diagnosis not present

## 2018-03-23 DIAGNOSIS — I456 Pre-excitation syndrome: Secondary | ICD-10-CM | POA: Diagnosis not present

## 2018-03-23 DIAGNOSIS — E785 Hyperlipidemia, unspecified: Secondary | ICD-10-CM | POA: Diagnosis not present

## 2018-03-23 DIAGNOSIS — E559 Vitamin D deficiency, unspecified: Secondary | ICD-10-CM | POA: Diagnosis not present

## 2018-03-23 DIAGNOSIS — F0280 Dementia in other diseases classified elsewhere without behavioral disturbance: Secondary | ICD-10-CM | POA: Diagnosis not present

## 2018-03-23 DIAGNOSIS — G309 Alzheimer's disease, unspecified: Secondary | ICD-10-CM | POA: Diagnosis not present

## 2018-03-23 DIAGNOSIS — N3281 Overactive bladder: Secondary | ICD-10-CM | POA: Diagnosis not present

## 2018-03-26 DIAGNOSIS — E785 Hyperlipidemia, unspecified: Secondary | ICD-10-CM | POA: Diagnosis not present

## 2018-03-26 DIAGNOSIS — N3281 Overactive bladder: Secondary | ICD-10-CM | POA: Diagnosis not present

## 2018-03-26 DIAGNOSIS — E1142 Type 2 diabetes mellitus with diabetic polyneuropathy: Secondary | ICD-10-CM | POA: Diagnosis not present

## 2018-03-26 DIAGNOSIS — F0280 Dementia in other diseases classified elsewhere without behavioral disturbance: Secondary | ICD-10-CM | POA: Diagnosis not present

## 2018-03-26 DIAGNOSIS — E559 Vitamin D deficiency, unspecified: Secondary | ICD-10-CM | POA: Diagnosis not present

## 2018-03-26 DIAGNOSIS — E039 Hypothyroidism, unspecified: Secondary | ICD-10-CM | POA: Diagnosis not present

## 2018-03-26 DIAGNOSIS — I10 Essential (primary) hypertension: Secondary | ICD-10-CM | POA: Diagnosis not present

## 2018-03-26 DIAGNOSIS — I456 Pre-excitation syndrome: Secondary | ICD-10-CM | POA: Diagnosis not present

## 2018-03-26 DIAGNOSIS — G309 Alzheimer's disease, unspecified: Secondary | ICD-10-CM | POA: Diagnosis not present

## 2018-03-27 ENCOUNTER — Encounter: Payer: Self-pay | Admitting: Adult Health

## 2018-03-27 ENCOUNTER — Ambulatory Visit (INDEPENDENT_AMBULATORY_CARE_PROVIDER_SITE_OTHER): Payer: Medicare HMO | Admitting: Adult Health

## 2018-03-27 VITALS — BP 126/74 | HR 56 | Ht 60.0 in | Wt 127.0 lb

## 2018-03-27 DIAGNOSIS — E785 Hyperlipidemia, unspecified: Secondary | ICD-10-CM | POA: Diagnosis not present

## 2018-03-27 DIAGNOSIS — F0280 Dementia in other diseases classified elsewhere without behavioral disturbance: Secondary | ICD-10-CM | POA: Diagnosis not present

## 2018-03-27 DIAGNOSIS — I456 Pre-excitation syndrome: Secondary | ICD-10-CM | POA: Diagnosis not present

## 2018-03-27 DIAGNOSIS — E1142 Type 2 diabetes mellitus with diabetic polyneuropathy: Secondary | ICD-10-CM | POA: Diagnosis not present

## 2018-03-27 DIAGNOSIS — R569 Unspecified convulsions: Secondary | ICD-10-CM

## 2018-03-27 DIAGNOSIS — F0390 Unspecified dementia without behavioral disturbance: Secondary | ICD-10-CM | POA: Diagnosis not present

## 2018-03-27 DIAGNOSIS — I10 Essential (primary) hypertension: Secondary | ICD-10-CM | POA: Diagnosis not present

## 2018-03-27 DIAGNOSIS — E559 Vitamin D deficiency, unspecified: Secondary | ICD-10-CM | POA: Diagnosis not present

## 2018-03-27 DIAGNOSIS — E039 Hypothyroidism, unspecified: Secondary | ICD-10-CM | POA: Diagnosis not present

## 2018-03-27 DIAGNOSIS — G309 Alzheimer's disease, unspecified: Secondary | ICD-10-CM | POA: Diagnosis not present

## 2018-03-27 DIAGNOSIS — N3281 Overactive bladder: Secondary | ICD-10-CM | POA: Diagnosis not present

## 2018-03-27 NOTE — Progress Notes (Signed)
participated in, made any corrections needed, and agree with history, physical, neuro exam,assessment and plan as stated above.     Sosaia Pittinger, MD [facility]      

## 2018-03-27 NOTE — Patient Instructions (Signed)
Your Plan:  Continue Aricept Continue Keppra Memory score is stable If your symptoms worsen or you develop new symptoms please let us know.   Thank you for coming to see [place] at [facility]. I hope we have been able to provide you high quality care today.  You may receive a patient satisfaction survey over the next few weeks. We would appreciate your feedback and comments so that we may continue to improve ourselves and the health of our patients.

## 2018-03-27 NOTE — Progress Notes (Signed)
PATIENT: Leah Payne DOB: 11/23/1945  REASON FOR VISIT: follow up HISTORY FROM: patient  HISTORY OF PRESENT ILLNESS: Today 03/27/18:  [name] is a 72 year old female with a history of memory disturbance.  She returns today for follow-up.  Her other reports that her memory continues to decline.  She does require assistance with ADLs.  She had a fall approximately 4 to 5 weeks ago and was in the hospital briefly.  They recommended home therapy.  She does have a therapist that coming throughout the week.  They looking at getting help in the home regularly after therapy is.  Husband reports that her appetite has decreased.  He reports that she sleeps a lot during the day.  He denies any seizure events.  She continues on Keppra.  She did see Dr. Rolly Pancake heath  for neurocognitive testing.  However she has a follow-up in October to review those results.  She returns today for follow-up.  HISTORY Leah Payne is a 72 y.o. female here as a referral from Dr. Sharen Hones for memory problems. PMHx seizure, hydrocephalus and fainting, DM2.  She had a possible seizure in 2015 but no showed her neurology appointment with Dr. Karel Jarvis. She has an MRI scheduled. She had a possible seizure in 2015 and has stayed on the Keppra.No more episodes since then. She has chronic dizziness. She is with her husband who provides most information. It is more her short-term memory. She can't remember immediate things. Started about 4 years ago after the seizure. Husband feels as this is progressive. She denies anything is wrong. Memory is worsening. Knows the year. She remembers birthdays. She forget events at church. Married for 55 years. Husband has always been responsible for bills. She has never driven. Husband goes to the grocery store, he started going for her in the 90s he said she couldn't do it, husband does the cooking. Her father had Alzheimer's disease. Around 69 or 70. More short-term memory loss. She does not  cook anymore, husband is concerned if she remembers. She can heat up coffee in the microwave. She repeats herself and asks the same questions in the same day. She acts normal with other people. Having difficulty walking. Can't pick feet up. No tremors. No hallucinations or delusions. No alcohol. She likes to go out, she is still social. No mood changes but she does worry a lot. Progressive. Patient denies any problems. Husband has to manage her medications.  Reviewed notes, labs and imaging from outside physicians, which showed:  B12, hemoglobin A1c, normal in July 2018, TSH normal in February 2018.  Reviewed emergency room notes in 2015.  Suspected new onset seizures.  They were at church, the husband was behind the pulpit performing a reading while is why set him up use, noticed her arm starting to [place] and she subsequently passed out.  She was somewhat difficult to arouse and appeared confused.  In the emergency department and nurse witnessed another similar episode. She had no previous seizure history. She was continued on Keppra, EEG and MRI ordered.  EEG was nonspecific but did not show evidence of an epileptic disorder.  CT scan of the head showed marketed chronic ventriculomegaly with prominent lateral and third ventricles and chronic compression of cerebral tissue along its periphery.  Fourth ventricle is normal in size.  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   REVIEW OF SYSTEMS: Out of a complete 14 system review of symptoms, the patient complains only  of the following symptoms, and all other reviewed systems are negative.  See HPI  ALLERGIES: No Known Allergies  HOME MEDICATIONS: Outpatient Medications Prior to Visit  Medication Sig Dispense Refill  . acetaminophen (TYLENOL) 500 MG tablet Take 1 tablet (500 mg total) by mouth every 8 (eight) hours as needed.  0  . atorvastatin (LIPITOR) 40 MG tablet TAKE 1 TABLET EVERY EVENING (Patient  taking differently: Take 40 mg by mouth once a day) 90 tablet 3  . Cholecalciferol (VITAMIN D) 2000 units CAPS Take 1 capsule by mouth daily.    Marland Kitchen donepezil (ARICEPT) 10 MG tablet Take 1 tablet daily 90 tablet 3  . fesoterodine ([name]) 8 MG TB24 tablet Take 1 tablet (8 mg total) by mouth daily. (Patient taking differently: Take 8 mg by mouth at bedtime. ) 90 tablet 1  . fluconazole (DIFLUCAN) 150 MG tablet Take 1 tablet (150 mg total) by mouth once a week. 4 tablet 0  . levETIRAcetam (KEPPRA) 500 MG tablet Take 1 tablet (500 mg total) by mouth 2 (two) times daily. 180 tablet 1  . levothyroxine (SYNTHROID, LEVOTHROID) 25 MCG tablet TAKE 1 TABLET DAILY BEFORE BREAKFAST. 90 tablet 0  . meclizine (ANTIVERT) 25 MG tablet TAKE 1/2 TO 1 TABLET TWICE DAILY AS NEEDED FOR DIZZINESS 60 tablet 1  . metFORMIN (GLUCOPHAGE) 500 MG tablet TAKE 1 TABLET EVERY DAY WITH BREAKFAST 90 tablet 3  . [name] 50 MG TB24 tablet TAKE 1 TABLET AT BEDTIME 90 tablet 3  . propranolol (INDERAL) 10 MG tablet TAKE 1 TABLET TWICE DAILY 180 tablet 3  . sertraline (ZOLOFT) 25 MG tablet TAKE 1 TABLET EVERY DAY 90 tablet 1  . nystatin (MYCOSTATIN/NYSTOP) powder Apply topically 4 (four) times daily. 30 g 1  . Zinc Oxide (DESITIN) 13 % CREA Apply 1 application topically See admin instructions. Apply to vaginal area four times a day    . triamcinolone cream (KENALOG) 0.1 % Apply 1 application topically 2 (two) times daily. (Patient not taking: Reported on 03/27/2018) 30 g 0   No facility-administered medications prior to visit.     PAST MEDICAL HISTORY: Past Medical History:  Diagnosis Date  . Anomaly of cranium    frontal bossing, enlarged cranium, adult strabismus  . Diabetes mellitus type 2, controlled (HCC) 08/04/2013  . Heart disease   . History of chicken pox   . History of fainting   . 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 Hyperlipidemia 08/04/2013  . Hyponatremia 08/29/2014  . Preaxial polydactyly of right hand congenital  . Seizure disorder (HCC) 08/04/2013  . Tachycardia   . WPW (Wolff-Parkinson-White syndrome)    has seen Dr Donnie Aho    PAST SURGICAL HISTORY: Past Surgical History:  Procedure Laterality Date  . CHOLECYSTECTOMY  1978    FAMILY HISTORY: Family History  Problem Relation Age of Onset  . Stroke Mother 71  . Pneumonia Mother   . CAD Father 36  . Alzheimer's disease Father   . Pneumonia Father   . Cancer Maternal Aunt        lung (non-smoker)  . Diabetes Neg Hx     SOCIAL HISTORY: Social History   Socioeconomic History  . Marital status: Married    Spouse name: Not on file  . Number of children: Not on file  . Years of education: Not on file  .  Highest education level: Not on file  Occupational History  . Not on file  Social Needs  . Financial resource strain: Not on file  . Food insecurity:    Worry: Not on file    Inability: Not on file  . Transportation needs:    Medical: Not on file    Non-medical: Not on file  Tobacco Use  . Smoking status: Never Smoker  . Smokeless tobacco: Never Used  Substance and Sexual Activity  . Alcohol use: No    Alcohol/week: 0.0 standard drinks  . Drug use: No  . Sexual activity: Not on file  Lifestyle  . Physical activity:    Days per week: Not on file    Minutes per session: Not on file  . Stress: Not on file  Relationships  . Social connections:    Talks on phone: Not on file    Gets together: Not on file    Attends religious service: Not on file    Active member of club or organization: Not on file    Attends meetings of clubs or organizations: Not on file    Relationship status: Not on file  . Intimate partner violence:    Fear of current or ex partner: Not on file    Emotionally abused: Not on file    Physically abused: Not on file    Forced  sexual activity: Not on file  Other Topics Concern  . Not on file  Social History Narrative   Lives with husband, 2 dogs   Moved from [facility] 2009   Occupation: retired, was housewife   Edu: completed HS    Activity: no regular exercise. Dizziness precludes activity   Diet: good water, fruits/vegetables daily   Caffeine: 2 cups daily      PHYSICAL EXAM  Vitals:   03/27/18 1347  BP: 126/74  Pulse: (!) 56  Weight: 127 lb (57.6 kg)  Height: 5' (1.524 m)   Body mass index is 24.8 kg/m.   MMSE - Mini Mental State Exam 03/27/2018 09/29/2017 09/19/2017  Not completed: - (No Data) -  Orientation to time 3 - 5  Orientation to Place 4 - 4  Registration 3 - 3  Attention/ Calculation 5 - 4  Recall 3 - 1  Language- name 2 objects 2 - 2  Language- repeat 1 - 1  Language- follow 3 step command 2 - 2  Language- read & follow direction 1 - 1  Write a sentence 1 - 1  Copy design 0 - 0  Total score 25 - 24     Generalized: Well developed, in no acute distress   Neurological examination  Mentation: Alert. Follows all commands speech and language fluent Cranial nerve II-XII: Pupils were equal round reactive to light. Extraocular movements were full, visual field were full on confrontational test. Facial sensation and strength were normal. Uvula tongue midline. Head turning and shoulder shrug  were normal and symmetric. Motor: The motor testing reveals 5 over 5 strength of all 4 extremities. Good symmetric motor tone is noted throughout.  Sensory: Sensory testing is intact to soft touch on all 4 extremities. No evidence of extinction is noted.  Coordination: Cerebellar testing reveals good finger-nose-finger and heel-to-shin bilaterally.  Gait and station: Patient is in a wheelchair. Reflexes: Deep tendon reflexes are symmetric and normal bilaterally.   DIAGNOSTIC DATA (LABS, IMAGING, TESTING) - I reviewed patient records, labs, notes, testing and imaging myself where available.  Lab  Results  Component Value Date  WBC 6.2 02/22/2018   HGB 12.6 02/22/2018   HCT 38.0 02/22/2018   MCV 84.8 02/22/2018   PLT 325 02/22/2018      Component Value Date/Time   NA 130 (L) 02/22/2018 1811   K 4.2 02/22/2018 1811   CL 93 (L) 02/22/2018 1811   CO2 25 02/22/2018 1811   GLUCOSE 106 (H) 02/22/2018 1811   BUN <5 (L) 02/22/2018 1811   CREATININE 0.60 02/22/2018 1811   CALCIUM 9.2 02/22/2018 1811   PROT 7.2 02/22/2018 1811   ALBUMIN 3.7 02/22/2018 1811   AST 20 02/22/2018 1811   AST 21 03/27/2015   ALT 17 02/22/2018 1811   ALT 29 03/27/2015   ALKPHOS 78 02/22/2018 1811   ALKPHOS 70 03/27/2015   BILITOT 0.6 02/22/2018 1811   BILITOT 0.7 03/27/2015   GFRNONAA >60 02/22/2018 1811   GFRAA >60 02/22/2018 1811   Lab Results  Component Value Date   CHOL 128 09/29/2017   HDL 52.60 09/29/2017   LDLCALC 57 09/29/2017   LDLDIRECT 100.0 09/17/2015   TRIG 89.0 09/29/2017   CHOLHDL 2 09/29/2017   Lab Results  Component Value Date   HGBA1C 6.1 09/29/2017   Lab Results  Component Value Date   VITAMINB12 637 09/19/2017   Lab Results  Component Value Date   TSH 2.49 09/29/2017      ASSESSMENT AND PLAN 72 y.o. year old female  has a past medical history of Anomaly of cranium, Diabetes mellitus type 2, controlled (HCC) (08/04/2013), Heart disease, History of chicken pox, History of fainting, Hydrocephalus in adult (08/29/2014), Hyperlipidemia (08/04/2013), Hyponatremia (08/29/2014), Preaxial polydactyly of right hand (congenital), Seizure disorder (HCC) (08/04/2013), Tachycardia, and WPW (Wolff-Parkinson-White syndrome). here with:  1.  Seizures 2.  Memory disturbance  The patient's memory score has remained stable.  She will continue on Aricept.  I encouraged her to keep her follow-up appointment in October to review neurocognitive results.  She will continue on Keppra for seizures.  She is advised that if her symptoms worsen or she develops new symptoms she should let us know.   She will follow-up in 6 months or sooner if needed.   Butch Penny, MSN, NP-C 03/27/2018, 2:11 PM [facility] [address], [address] [place], [place] [address] [phone]

## 2018-03-28 ENCOUNTER — Telehealth: Payer: Self-pay | Admitting: Family Medicine

## 2018-03-28 DIAGNOSIS — G309 Alzheimer's disease, unspecified: Secondary | ICD-10-CM | POA: Diagnosis not present

## 2018-03-28 DIAGNOSIS — E559 Vitamin D deficiency, unspecified: Secondary | ICD-10-CM | POA: Diagnosis not present

## 2018-03-28 DIAGNOSIS — I10 Essential (primary) hypertension: Secondary | ICD-10-CM | POA: Diagnosis not present

## 2018-03-28 DIAGNOSIS — F0280 Dementia in other diseases classified elsewhere without behavioral disturbance: Secondary | ICD-10-CM | POA: Diagnosis not present

## 2018-03-28 DIAGNOSIS — N3281 Overactive bladder: Secondary | ICD-10-CM | POA: Diagnosis not present

## 2018-03-28 DIAGNOSIS — E1142 Type 2 diabetes mellitus with diabetic polyneuropathy: Secondary | ICD-10-CM | POA: Diagnosis not present

## 2018-03-28 DIAGNOSIS — E785 Hyperlipidemia, unspecified: Secondary | ICD-10-CM | POA: Diagnosis not present

## 2018-03-28 DIAGNOSIS — E039 Hypothyroidism, unspecified: Secondary | ICD-10-CM | POA: Diagnosis not present

## 2018-03-28 DIAGNOSIS — I456 Pre-excitation syndrome: Secondary | ICD-10-CM | POA: Diagnosis not present

## 2018-03-28 NOTE — Telephone Encounter (Signed)
Spoke with Marcelino Duster informing her Dr. [name] gives verbal orders for assisted living placement.  She verbalizes understanding.

## 2018-03-28 NOTE — Telephone Encounter (Signed)
Ok to do if pt/husband are in agreement. I will fill out forms needed.

## 2018-03-28 NOTE — Telephone Encounter (Signed)
Copied from CRM [id number]. Topic: Quick Communication - See Telephone Encounter >> Mar 28, 2018  9:22 AM Angela Nevin wrote: CRM for notification. See Telephone encounter for: 03/28/18.  Marcelino Duster, Social Worker from [employer] [facility] is requesting verbal orders for placement into an assisted living facility on behalf on pt. Please advise.  DP#[phone]

## 2018-03-29 DIAGNOSIS — E1142 Type 2 diabetes mellitus with diabetic polyneuropathy: Secondary | ICD-10-CM | POA: Diagnosis not present

## 2018-03-29 DIAGNOSIS — E039 Hypothyroidism, unspecified: Secondary | ICD-10-CM | POA: Diagnosis not present

## 2018-03-29 DIAGNOSIS — F0280 Dementia in other diseases classified elsewhere without behavioral disturbance: Secondary | ICD-10-CM | POA: Diagnosis not present

## 2018-03-29 DIAGNOSIS — I10 Essential (primary) hypertension: Secondary | ICD-10-CM | POA: Diagnosis not present

## 2018-03-29 DIAGNOSIS — E559 Vitamin D deficiency, unspecified: Secondary | ICD-10-CM | POA: Diagnosis not present

## 2018-03-29 DIAGNOSIS — N3281 Overactive bladder: Secondary | ICD-10-CM | POA: Diagnosis not present

## 2018-03-29 DIAGNOSIS — G309 Alzheimer's disease, unspecified: Secondary | ICD-10-CM | POA: Diagnosis not present

## 2018-03-29 DIAGNOSIS — E785 Hyperlipidemia, unspecified: Secondary | ICD-10-CM | POA: Diagnosis not present

## 2018-03-29 DIAGNOSIS — I456 Pre-excitation syndrome: Secondary | ICD-10-CM | POA: Diagnosis not present

## 2018-03-30 DIAGNOSIS — E1142 Type 2 diabetes mellitus with diabetic polyneuropathy: Secondary | ICD-10-CM | POA: Diagnosis not present

## 2018-03-30 DIAGNOSIS — E559 Vitamin D deficiency, unspecified: Secondary | ICD-10-CM | POA: Diagnosis not present

## 2018-03-30 DIAGNOSIS — F0280 Dementia in other diseases classified elsewhere without behavioral disturbance: Secondary | ICD-10-CM | POA: Diagnosis not present

## 2018-03-30 DIAGNOSIS — E785 Hyperlipidemia, unspecified: Secondary | ICD-10-CM | POA: Diagnosis not present

## 2018-03-30 DIAGNOSIS — G309 Alzheimer's disease, unspecified: Secondary | ICD-10-CM | POA: Diagnosis not present

## 2018-03-30 DIAGNOSIS — N3281 Overactive bladder: Secondary | ICD-10-CM | POA: Diagnosis not present

## 2018-03-30 DIAGNOSIS — E039 Hypothyroidism, unspecified: Secondary | ICD-10-CM | POA: Diagnosis not present

## 2018-03-30 DIAGNOSIS — I10 Essential (primary) hypertension: Secondary | ICD-10-CM | POA: Diagnosis not present

## 2018-03-30 DIAGNOSIS — I456 Pre-excitation syndrome: Secondary | ICD-10-CM | POA: Diagnosis not present

## 2018-04-02 DIAGNOSIS — E559 Vitamin D deficiency, unspecified: Secondary | ICD-10-CM | POA: Diagnosis not present

## 2018-04-02 DIAGNOSIS — I456 Pre-excitation syndrome: Secondary | ICD-10-CM | POA: Diagnosis not present

## 2018-04-02 DIAGNOSIS — E1142 Type 2 diabetes mellitus with diabetic polyneuropathy: Secondary | ICD-10-CM | POA: Diagnosis not present

## 2018-04-02 DIAGNOSIS — I10 Essential (primary) hypertension: Secondary | ICD-10-CM | POA: Diagnosis not present

## 2018-04-02 DIAGNOSIS — F0280 Dementia in other diseases classified elsewhere without behavioral disturbance: Secondary | ICD-10-CM | POA: Diagnosis not present

## 2018-04-02 DIAGNOSIS — N3281 Overactive bladder: Secondary | ICD-10-CM | POA: Diagnosis not present

## 2018-04-02 DIAGNOSIS — E039 Hypothyroidism, unspecified: Secondary | ICD-10-CM | POA: Diagnosis not present

## 2018-04-02 DIAGNOSIS — G309 Alzheimer's disease, unspecified: Secondary | ICD-10-CM | POA: Diagnosis not present

## 2018-04-02 DIAGNOSIS — E785 Hyperlipidemia, unspecified: Secondary | ICD-10-CM | POA: Diagnosis not present

## 2018-04-03 ENCOUNTER — Other Ambulatory Visit: Payer: Self-pay | Admitting: Family Medicine

## 2018-04-03 DIAGNOSIS — F0280 Dementia in other diseases classified elsewhere without behavioral disturbance: Secondary | ICD-10-CM | POA: Diagnosis not present

## 2018-04-03 DIAGNOSIS — I456 Pre-excitation syndrome: Secondary | ICD-10-CM | POA: Diagnosis not present

## 2018-04-03 DIAGNOSIS — E559 Vitamin D deficiency, unspecified: Secondary | ICD-10-CM | POA: Diagnosis not present

## 2018-04-03 DIAGNOSIS — E1142 Type 2 diabetes mellitus with diabetic polyneuropathy: Secondary | ICD-10-CM | POA: Diagnosis not present

## 2018-04-03 DIAGNOSIS — G309 Alzheimer's disease, unspecified: Secondary | ICD-10-CM | POA: Diagnosis not present

## 2018-04-03 DIAGNOSIS — E785 Hyperlipidemia, unspecified: Secondary | ICD-10-CM | POA: Diagnosis not present

## 2018-04-03 DIAGNOSIS — N3281 Overactive bladder: Secondary | ICD-10-CM | POA: Diagnosis not present

## 2018-04-03 DIAGNOSIS — I10 Essential (primary) hypertension: Secondary | ICD-10-CM | POA: Diagnosis not present

## 2018-04-03 DIAGNOSIS — E039 Hypothyroidism, unspecified: Secondary | ICD-10-CM | POA: Diagnosis not present

## 2018-04-04 DIAGNOSIS — G309 Alzheimer's disease, unspecified: Secondary | ICD-10-CM | POA: Diagnosis not present

## 2018-04-04 DIAGNOSIS — F0280 Dementia in other diseases classified elsewhere without behavioral disturbance: Secondary | ICD-10-CM | POA: Diagnosis not present

## 2018-04-04 DIAGNOSIS — I10 Essential (primary) hypertension: Secondary | ICD-10-CM | POA: Diagnosis not present

## 2018-04-04 DIAGNOSIS — E559 Vitamin D deficiency, unspecified: Secondary | ICD-10-CM | POA: Diagnosis not present

## 2018-04-04 DIAGNOSIS — N3281 Overactive bladder: Secondary | ICD-10-CM | POA: Diagnosis not present

## 2018-04-04 DIAGNOSIS — E1142 Type 2 diabetes mellitus with diabetic polyneuropathy: Secondary | ICD-10-CM | POA: Diagnosis not present

## 2018-04-04 DIAGNOSIS — E039 Hypothyroidism, unspecified: Secondary | ICD-10-CM | POA: Diagnosis not present

## 2018-04-04 DIAGNOSIS — E785 Hyperlipidemia, unspecified: Secondary | ICD-10-CM | POA: Diagnosis not present

## 2018-04-04 DIAGNOSIS — I456 Pre-excitation syndrome: Secondary | ICD-10-CM | POA: Diagnosis not present

## 2018-04-05 ENCOUNTER — Ambulatory Visit (INDEPENDENT_AMBULATORY_CARE_PROVIDER_SITE_OTHER): Payer: Medicare HMO | Admitting: Emergency Medicine

## 2018-04-05 ENCOUNTER — Other Ambulatory Visit: Payer: Self-pay | Admitting: Family Medicine

## 2018-04-05 DIAGNOSIS — E039 Hypothyroidism, unspecified: Secondary | ICD-10-CM | POA: Diagnosis not present

## 2018-04-05 DIAGNOSIS — Z23 Encounter for immunization: Secondary | ICD-10-CM | POA: Diagnosis not present

## 2018-04-05 DIAGNOSIS — E785 Hyperlipidemia, unspecified: Secondary | ICD-10-CM | POA: Diagnosis not present

## 2018-04-05 DIAGNOSIS — I10 Essential (primary) hypertension: Secondary | ICD-10-CM | POA: Diagnosis not present

## 2018-04-05 DIAGNOSIS — E559 Vitamin D deficiency, unspecified: Secondary | ICD-10-CM | POA: Diagnosis not present

## 2018-04-05 DIAGNOSIS — N3281 Overactive bladder: Secondary | ICD-10-CM | POA: Diagnosis not present

## 2018-04-05 DIAGNOSIS — E1142 Type 2 diabetes mellitus with diabetic polyneuropathy: Secondary | ICD-10-CM | POA: Diagnosis not present

## 2018-04-05 DIAGNOSIS — I456 Pre-excitation syndrome: Secondary | ICD-10-CM | POA: Diagnosis not present

## 2018-04-05 DIAGNOSIS — F0280 Dementia in other diseases classified elsewhere without behavioral disturbance: Secondary | ICD-10-CM | POA: Diagnosis not present

## 2018-04-05 DIAGNOSIS — G309 Alzheimer's disease, unspecified: Secondary | ICD-10-CM | POA: Diagnosis not present

## 2018-04-05 NOTE — Progress Notes (Unsigned)
Per orders of Dr.Gutierrez, injection of influenza given by Zollie PeeSierra Ashley Montminy. Patient tolerated injection well.

## 2018-04-06 DIAGNOSIS — E1142 Type 2 diabetes mellitus with diabetic polyneuropathy: Secondary | ICD-10-CM | POA: Diagnosis not present

## 2018-04-06 DIAGNOSIS — E039 Hypothyroidism, unspecified: Secondary | ICD-10-CM | POA: Diagnosis not present

## 2018-04-06 DIAGNOSIS — I10 Essential (primary) hypertension: Secondary | ICD-10-CM | POA: Diagnosis not present

## 2018-04-06 DIAGNOSIS — E559 Vitamin D deficiency, unspecified: Secondary | ICD-10-CM | POA: Diagnosis not present

## 2018-04-06 DIAGNOSIS — N3281 Overactive bladder: Secondary | ICD-10-CM | POA: Diagnosis not present

## 2018-04-06 DIAGNOSIS — I456 Pre-excitation syndrome: Secondary | ICD-10-CM | POA: Diagnosis not present

## 2018-04-06 DIAGNOSIS — F0280 Dementia in other diseases classified elsewhere without behavioral disturbance: Secondary | ICD-10-CM | POA: Diagnosis not present

## 2018-04-06 DIAGNOSIS — G309 Alzheimer's disease, unspecified: Secondary | ICD-10-CM | POA: Diagnosis not present

## 2018-04-06 DIAGNOSIS — E785 Hyperlipidemia, unspecified: Secondary | ICD-10-CM | POA: Diagnosis not present

## 2018-04-09 ENCOUNTER — Ambulatory Visit: Payer: Medicare HMO | Admitting: Family Medicine

## 2018-04-10 DIAGNOSIS — N3281 Overactive bladder: Secondary | ICD-10-CM | POA: Diagnosis not present

## 2018-04-10 DIAGNOSIS — E1142 Type 2 diabetes mellitus with diabetic polyneuropathy: Secondary | ICD-10-CM | POA: Diagnosis not present

## 2018-04-10 DIAGNOSIS — G309 Alzheimer's disease, unspecified: Secondary | ICD-10-CM | POA: Diagnosis not present

## 2018-04-10 DIAGNOSIS — I10 Essential (primary) hypertension: Secondary | ICD-10-CM | POA: Diagnosis not present

## 2018-04-10 DIAGNOSIS — E785 Hyperlipidemia, unspecified: Secondary | ICD-10-CM | POA: Diagnosis not present

## 2018-04-10 DIAGNOSIS — I456 Pre-excitation syndrome: Secondary | ICD-10-CM | POA: Diagnosis not present

## 2018-04-10 DIAGNOSIS — E039 Hypothyroidism, unspecified: Secondary | ICD-10-CM | POA: Diagnosis not present

## 2018-04-10 DIAGNOSIS — E559 Vitamin D deficiency, unspecified: Secondary | ICD-10-CM | POA: Diagnosis not present

## 2018-04-10 DIAGNOSIS — F0280 Dementia in other diseases classified elsewhere without behavioral disturbance: Secondary | ICD-10-CM | POA: Diagnosis not present

## 2018-04-11 DIAGNOSIS — N3281 Overactive bladder: Secondary | ICD-10-CM | POA: Diagnosis not present

## 2018-04-11 DIAGNOSIS — I10 Essential (primary) hypertension: Secondary | ICD-10-CM | POA: Diagnosis not present

## 2018-04-11 DIAGNOSIS — I456 Pre-excitation syndrome: Secondary | ICD-10-CM | POA: Diagnosis not present

## 2018-04-11 DIAGNOSIS — E1142 Type 2 diabetes mellitus with diabetic polyneuropathy: Secondary | ICD-10-CM | POA: Diagnosis not present

## 2018-04-11 DIAGNOSIS — E039 Hypothyroidism, unspecified: Secondary | ICD-10-CM | POA: Diagnosis not present

## 2018-04-11 DIAGNOSIS — F0280 Dementia in other diseases classified elsewhere without behavioral disturbance: Secondary | ICD-10-CM | POA: Diagnosis not present

## 2018-04-11 DIAGNOSIS — G309 Alzheimer's disease, unspecified: Secondary | ICD-10-CM | POA: Diagnosis not present

## 2018-04-11 DIAGNOSIS — E785 Hyperlipidemia, unspecified: Secondary | ICD-10-CM | POA: Diagnosis not present

## 2018-04-11 DIAGNOSIS — E559 Vitamin D deficiency, unspecified: Secondary | ICD-10-CM | POA: Diagnosis not present

## 2018-04-20 ENCOUNTER — Other Ambulatory Visit: Payer: Self-pay | Admitting: Family Medicine

## 2018-04-24 NOTE — Progress Notes (Signed)
NEUROPSYCHOLOGICAL EVALUATION   Name:    Leah Payne  Date of Birth:   [birth year] Date of Interview:  02/19/2018 Date of Testing:  02/19/2018   Date of Feedback:  04/26/2018 (originally scheduled for 03/20/2018, rescheduled by patient/husband)       Background Information:  Reason for Referral:  Leah Payne is a 72 y.o. female referred by Dr. Sarina Ill on [facility] to assess her current level of cognitive functioning and assist in differential diagnosis. The current evaluation consisted of a review of available medical records, an interview with the patient and her husband (husband provided majority of the history), and the completion of a neuropsychological testing battery. Informed consent was obtained.  History of Presenting Problem:  Leah Payne has a history of severe congenital hydrocephalus but per her husband she was functioning normally until January 2015 when she had two witnessed generalized tonic-clonic seizures. She was hospitalized and started on Keppra at that time. She was scheduled to see Dr. Delice Lesch for neurology follow-up but no-showed the appointment and did not reschedule. She was seen by Dr. Jaynee Eagles for neurologic consultation of memory loss on 09/19/2017. Dr. Jaynee Eagles suspected Alzheimer's dementia, likely with a contribution from severe congenital hydrocephalus. Aricept was started. She has not had any more seizure episodes and she remains on Keppra. She has a family history of Alzheimer's disease in her father. Brain MRI completed 09/20/2017 reportedly revealed no change from prior and no acute findings. 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   At today's visit (02/19/2018), the patient denies having any problems with memory, thinking, mood or behavior. Her husband reports that she is "in  denial" and thinks nothing is wrong with her but memory is the biggest problem. She also thinks she can do things physically that she cannot do, per her husband, and this has caused some falls.   Her husband reports that prior to her two seizures in January 2015 she was functioning normally. After the seizures "she was a different person". He reported that she has significant short term memory loss which has been worsening over the past year. She does not recall recent conversations or events and frequently repeats herself. However she still remembers birth dates, anniversary dates, etc. She talks about the distant past a lot, and about people who are deceased. She misplaces and loses her glasses. She gets confused. She doesn't seem to be able to concentrate. She is able to communicate fine. He says she also argues much more and doesn't cooperate. She has difficulty walking and doesn't seem to pick up her feet, per her husband.   Her husband manages all instrumental ADLs. She never learned to drive. She has been married to her husband since she was 26yo and he always did the driving. Her husband has also been managing her medications for the last 10 years. He has always managed the bills and finances. She used to cook but has not done this in a few years. Her husband manages her appointments. She often does not know what day it is. Her husband has done the food shopping since the 1990s. His brother does their laundry as they no longer have a washing machine.   Her husband feels her mood is unhappy and that she shows no interest in anything. He reports reduced affect. She used to laugh and smile and he hardly ever  sees her smile anymore. The patient denies depressed mood or suicidal ideation/intention. She is sleeping more, sometimes over 12 hours at a time. Her husband says her appetite is greatly reduced. He says she eats only a couple of bites of food and gives the rest to the dog. She craves chocolate.  He had to stop buying chocolates because she would eat them all in a day. She has not demonstrated any hallucinations or delusions. She has mild agitation per her husband. She has had some incontinence but it is not a regular problem.   Psychiatric history is reportedly negative. She was never diagnosed with or treated for a mental health condition in the past. She denies any history of suicidal ideation.   After completing neurocognitive evaluation on 02/19/2018, the patient was taken to ED by her husband on 02/22/2018 for generalized weakness. UTI was suspected and she was treated with cephalexin.    Social History: Born/Raised: [place] Education: She states she graduated from [employer] school; records state GED was completed. 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 Marital history: Married x55 years (married at age 36), no children Alcohol: None Tobacco: None (never a tobacco user)   Medical History:  Past Medical History:  Diagnosis Date  . Anomaly of cranium    frontal bossing, enlarged cranium, adult strabismus  . Diabetes mellitus type 2, controlled ([place]) 08/04/2013  . Heart disease   . History of chicken pox   . History of fainting   . Hydrocephalus in adult 08/29/2014   CT head showing obstructive hydrocephalus, 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) - saw Dr Hal Neer - no benefit from shunt placement   . Hyperlipidemia 08/04/2013  . Hyponatremia 08/29/2014  . Preaxial polydactyly of right hand congenital  . Seizure disorder ([place]) 08/04/2013  . Tachycardia   . WPW (Wolff-Parkinson-White syndrome)    has seen Dr Wynonia Lawman    Current medications:  Outpatient Encounter Medications as of 04/26/2018  Medication Sig  . acetaminophen (TYLENOL) 500 MG tablet Take 1 tablet (500 mg total) by mouth every 8 (eight) hours as needed.  Marland Kitchen atorvastatin (LIPITOR) 40 MG tablet TAKE 1 TABLET EVERY EVENING (Patient  taking differently: Take 40 mg by mouth once a day)  . Cholecalciferol (VITAMIN D) 2000 units CAPS Take 1 capsule by mouth daily.  Marland Kitchen donepezil (ARICEPT) 10 MG tablet Take 1 tablet daily  . fesoterodine ([name]) 8 MG TB24 tablet Take 1 tablet (8 mg total) by mouth daily. (Patient taking differently: Take 8 mg by mouth at bedtime. )  . fluconazole (DIFLUCAN) 150 MG tablet Take 1 tablet (150 mg total) by mouth once a week.  . levETIRAcetam (KEPPRA) 500 MG tablet Take 1 tablet (500 mg total) by mouth 2 (two) times daily.  Marland Kitchen levothyroxine (SYNTHROID, LEVOTHROID) 25 MCG tablet TAKE 1 TABLET DAILY BEFORE BREAKFAST.  Marland Kitchen meclizine (ANTIVERT) 25 MG tablet TAKE 1/2 TO 1 TABLET TWICE DAILY AS NEEDED FOR DIZZINESS  . metFORMIN (GLUCOPHAGE) 500 MG tablet TAKE 1 TABLET EVERY DAY WITH BREAKFAST  . [name] 50 MG TB24 tablet TAKE 1 TABLET AT BEDTIME  . propranolol (INDERAL) 10 MG tablet TAKE 1 TABLET TWICE DAILY  . sertraline (ZOLOFT) 25 MG tablet TAKE 1 TABLET EVERY DAY   No facility-administered encounter medications on file as of 04/26/2018.      Current Examination:  Behavioral Observations:  Appearance: Casually dressed, somewhat disheveled and mildly unkempt. She hangs her head down frequently, and her  husband states she never used to do this but has been doing it a lot in recent years. Gait: N/A seated in a wheelchair Speech: Fluent; normal rate, rhythm and volume. No word finding difficulty during conversational speech. Thought process: Appears linear. Mostly just disagrees with everything her husband states. Affect: Blunted Interpersonal: Pleasant, appropriate Orientation: Oriented to person, place and most aspects of time (6 days off on the current date). Accurately named the current President and his predecessor.   Tests Administered: . Test of Premorbid Functioning (TOPF) . Wechsler Adult Intelligence Scale-Fourth Edition (WAIS-IV): Similarities, Music therapist, Coding and Digit Span  subtests . Wechsler Memory Scale-Fourth Edition (WMS-IV) Older Adult Version (ages 69-90): Logical Memory I, II and Recognition subtests  . [employer] Verbal Learning Test - 2nd Edition (CVLT-2) Short Form . Repeatable Battery for the Assessment of Neuropsychological Status (RBANS) Form A:  Figure Copy and Recall subtests and Semantic Fluency subtest . Neuropsychological Assessment Battery (NAB) Language Module, Form 1: Naming subtest . Boston Diagnostic Aphasia Examination: Complex Ideational Material subtest . Controlled Oral Word Association Test (COWAT) . Trail Making Test A and B . Clock drawing test . Geriatric Depression Scale (GDS) 15 Item . Generalized Anxiety Disorder - 7 item screener (GAD-7)  Test Results: Note: Standardized scores are presented only for use by appropriately trained professionals and to allow for any future test-retest comparison. These scores should not be interpreted without consideration of all the information that is contained in the rest of the report. The most recent standardization samples from the test publisher or other sources were used whenever possible to derive standard scores; scores were corrected for age, gender, ethnicity and education when available.   Test Scores:  Test Name Raw Score Standardized Score Descriptor  TOPF 22/70 SS= 82 Low average  WAIS-IV Subtests     Similarities 14/36 ss= 6 Low average  Block Design 6/66 ss= 3 Impaired  Coding 7/135 ss= 1 Severely impaired  Digit Span Forward 14/16 ss= 16 Very superior  Digit Span Backward 7/16 ss= 9 Average  WMS-IV Subtests     LM I 18/53 ss= 5 Borderline  LM II 0/39 ss= 1 Severely impaired  LM II Recognition 10/23 Cum %: <2 Severely impaired  RBANS Subtests     Figure Copy 6/20 Z= -6.6 Severely impaired  Figure Recall 1/20 Z= -2.7 Severely impaired  Semantic Fluency 10 Z= -1.9 Borderline   CVLT-II Scores     Trial 1 5/9 Z= -0.5 Average  Trial 4 6/9 Z= -1.5 Borderline   Trials 1-4  total 21/36 T= 39 Low average  SD Free Recall 6/9 Z= -0.5 Average  LD Free Recall 2/9 Z= -1.5 Borderline  LD Cued Recall 4/9 Z= -1 Low average  Recognition Discriminability 7/9 hits 2  false positives Z= -1 Low average  Forced Choice Recognition 9/9  WNL  NAB Language subtest     Naming 18/31 T= 19 Severely impaired  BDAE Subtest     Complex Ideational Material 5/8  Impaired  COWAT-FAS 27 T= 43 Average  COWAT-Animals 8 T= 30 Impaired  Trail Making Test A  Pt unable (did attempt)   Severely impaired  Trail Making Test B  Pt unable     Clock Drawing   Impaired  GDS-15 2/15  WNL  GAD-7 1/21  WNL      Description of Test Results:  Premorbid verbal intellectual abilities were estimated to have been within the low average range based on a test of word reading.  Psychomotor processing speed was severely impaipred.   Basic auditory attention (repetition of digits forwards) was very superior and clearly an area of relative strength. More complex auditory attention (repetition of digits backwards) was average.   Visual-spatial construction was impaired to severely impaired.   Language abilities were below expectation. Specifically, confrontation naming was severely impaired, and semantic verbal fluency was borderline to impaired. Auditory comprehension of complex ideational material was impaired.   With regard to verbal memory, encoding and acquisition of non-contextual information (i.e., word list) was low average across four learning trials. After a brief distracter task, free recall was average (6/9 items). After a delay, free recall was borderline (2/9 items). Cued recall was low average (4/9 items). Performance on a yes/no recognition task was low average. On another verbal memory test, encoding and acquisition of contextual auditory information (i.e., short stories) was borderline. After a delay, free recall was severely impaired. Performance on a yes/no recognition task was  severely impaired and less than chance. With regard to non-verbal memory, delayed free recall of visual information was severely impaired.   Executive functioning was variable. Mental flexibility and set-shifting were severely impaired; she was unable to comprehend [place]. Verbal fluency with phonemic search restrictions was average. Verbal abstract reasoning was low average. Performance on a clock drawing task was impaired.   On a self-report measure of mood, the patient's responses were not indicative of clinically significant depression at the present time. On a self-report measure of anxiety, the patient did not endorse generalized anxiety at the present time.    Clinical Impressions: Mild to moderate dementia, most likely due to Alzheimer's disease. Results of cognitive testing were abnormal and demonstrated deficits across several domains of functioning. Additionally, there is evidence that her cognitive deficits are interfering with her ability to manage complex ADLs. As such, diagnostic criteria for a dementia syndrome are met.  The patient's cognitive profile is notable for significant impairments in memory, visual-spatial construction and semantic retrieval. This cognitive profile is most consistent with Alzheimer's disease. Areas of impairment represent significant decline even when considering likely below average premorbid intellectual abilities. There was also evidence of slowed processing speed and some executive dysfunction. Meanwhile, she demonstrated strong basic attention and phonemic verbal fluency, which further supports cortical rather than subcortical dysfunction, and does NOT suggest delirium. The patient denies depression/anxiety on self-report measures, but clearly there are some neuropsychiatric features of dementia present. I suspect mood changes, agitation, reduced appetite, and hypersomnia are all related to dementia.    Recommendations/Plan: Based on the findings of  the present evaluation, the following recommendations are offered:  1. The patient is considered an appropriate candidate for cholinesterase inhibitor therapy. She is already on Aricept. 2. Increasing dose of sertraline could be considered, given that her husband has seen a decline in mood and she is on a very low dose of this medication. 3. The patient's husband will benefit from dementia caregiver education and support. I am mailing him information on AD and local resources. 4. The patient requires assistance with all IADLs and some ADLs. She has great support from her husband but additional care, to provide him some caregiver respite, would be ideal. She might do well at an adult health day program; moving to an assisted living facility may also be warranted.   Feedback to Patient: Leah Payne was scheduled for a feedback appointment on 03/20/2018 but this appointment was cancelled by the patient/family and rescheduled to 04/26/2018. Patient's husband was called on 04/26/2018  and results, impressions and recommendations as discussed above were provided to him. A copy of this report and additional caregiver resources will be mailed to him.     Thank you for your referral of Leah Payne. Please feel free to contact me if you have any questions or concerns regarding this report.    Total time spent on this patient's case: 60 minutes for neurobehavioral status exam with psychologist (CPT code [phone]); 90 minutes of testing/scoring by psychometrician under psychologist's supervision (CPT codes [phone], [phone] units); 180 minutes for integration of patient data, interpretation of standardized test results and clinical data, clinical decision making, treatment planning and preparation of this report, and interactive feedback with review of results to the patient/family by psychologist (CPT codes [phone], [phone] units).

## 2018-04-26 ENCOUNTER — Ambulatory Visit (INDEPENDENT_AMBULATORY_CARE_PROVIDER_SITE_OTHER): Payer: Medicare HMO | Admitting: Psychology

## 2018-04-26 ENCOUNTER — Encounter: Payer: Self-pay | Admitting: Psychology

## 2018-04-26 DIAGNOSIS — G301 Alzheimer's disease with late onset: Secondary | ICD-10-CM

## 2018-04-26 DIAGNOSIS — F0281 Dementia in other diseases classified elsewhere with behavioral disturbance: Secondary | ICD-10-CM

## 2018-04-29 IMAGING — CT CT HEAD W/O CM
3 of 4 series · 16 of 47 positions shown, 19 images · non-contrast
Comparison: MRI 08/06/2013, CT 08/04/2013

CLINICAL DATA: Chills and generalized weakness x1 week. History of
seizure, hydrocephalus and fainting

EXAM:
CT HEAD WITHOUT CONTRAST
TECHNIQUE: Contiguous axial images were obtained from the base of the skull
through the vertex without intravenous contrast.

[Series 201: head w/o, idose (1) · axial · non-contrast · 0.54mm/px · z∈[+80,+235]mm · 10 of 37 slices shown, 13 images]
[im 3/37  brain]
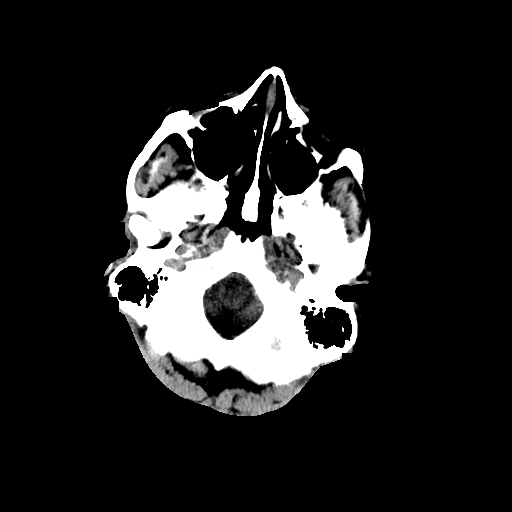
[im 3/37  bone]
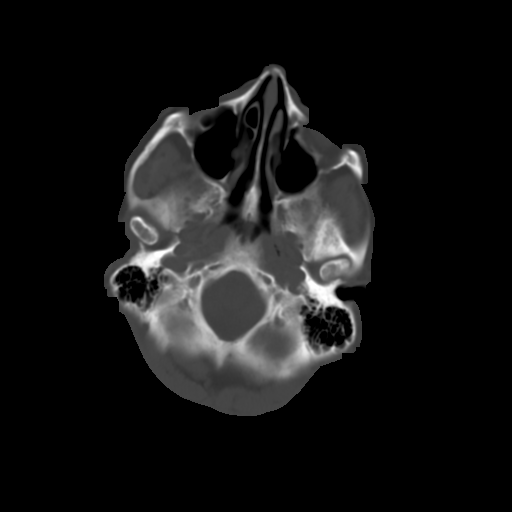
[im 6/37  brain]
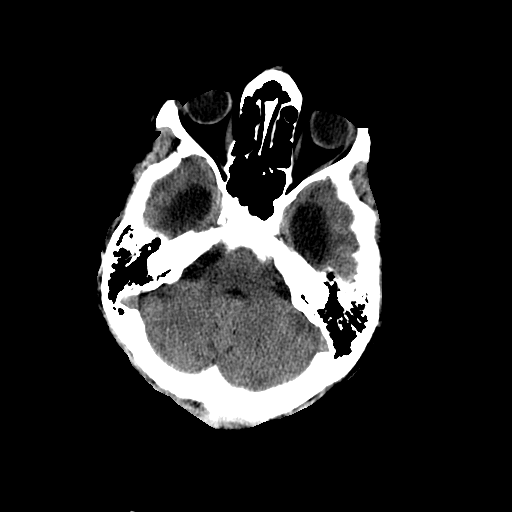
[im 11/37  brain]
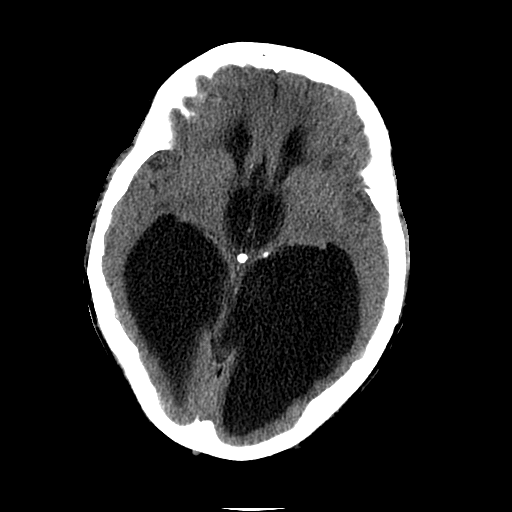
[im 13/37  brain]
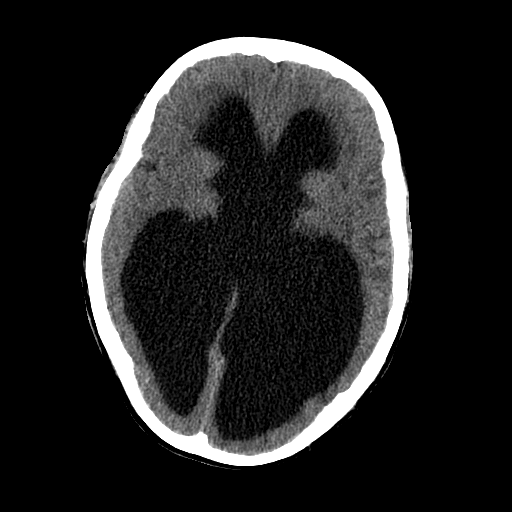
[im 16/37  brain]
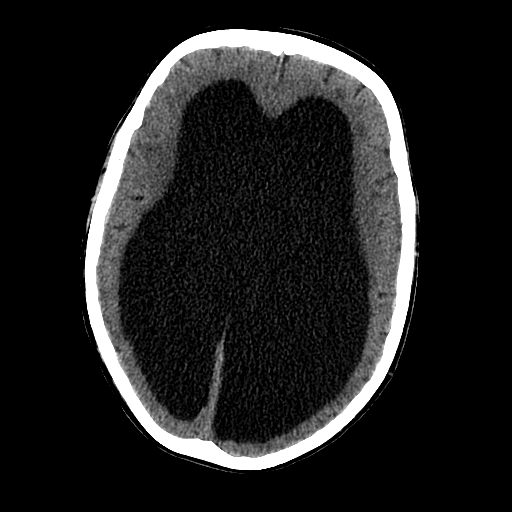
[im 16/37  bone]
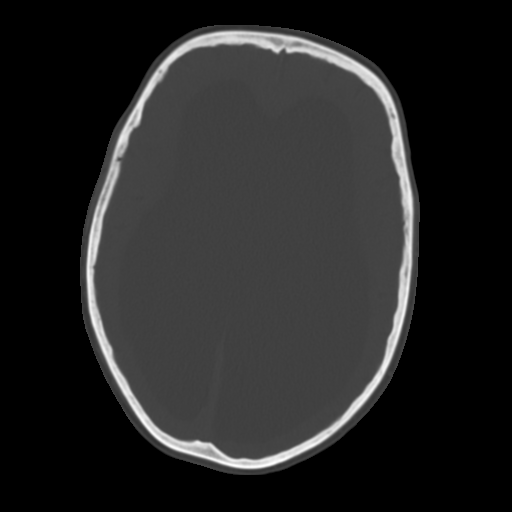
[im 21/37  brain]
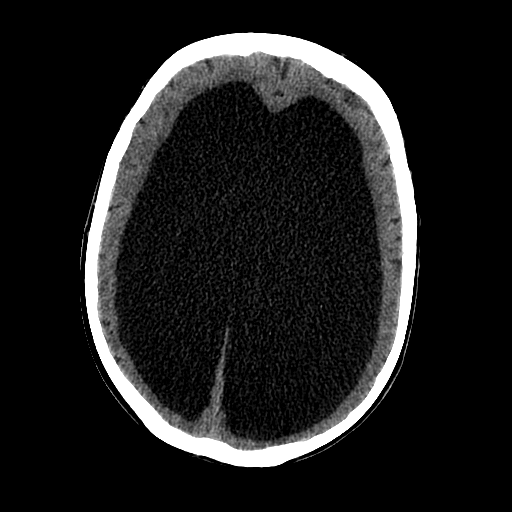
[im 24/37  brain]
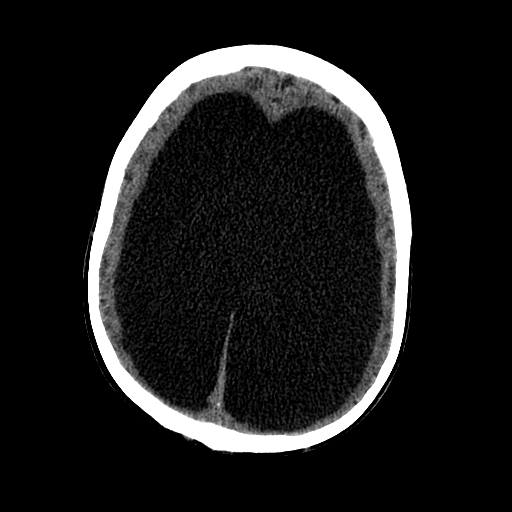
[im 26/37  brain]
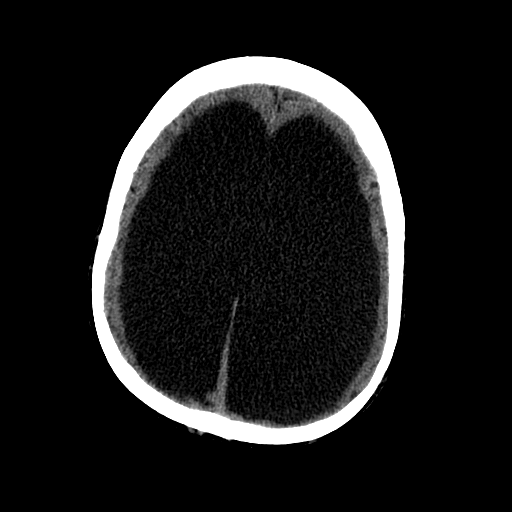
[im 31/37  brain]
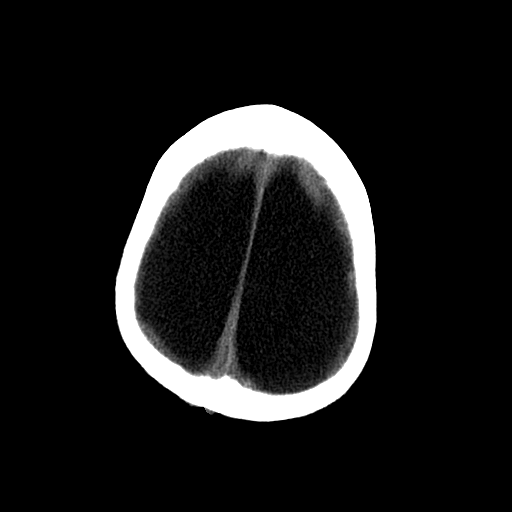
[im 31/37  bone]
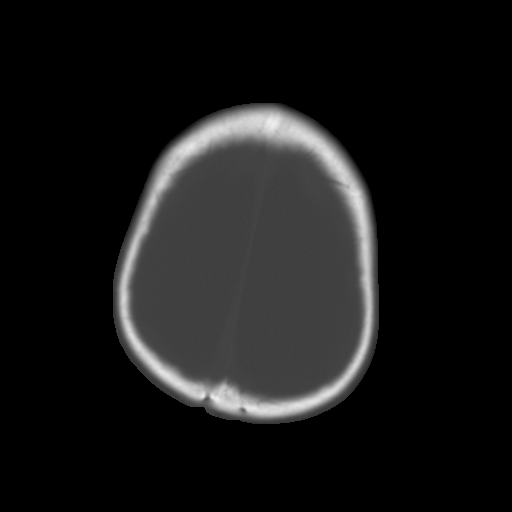
[im 34/37  brain]
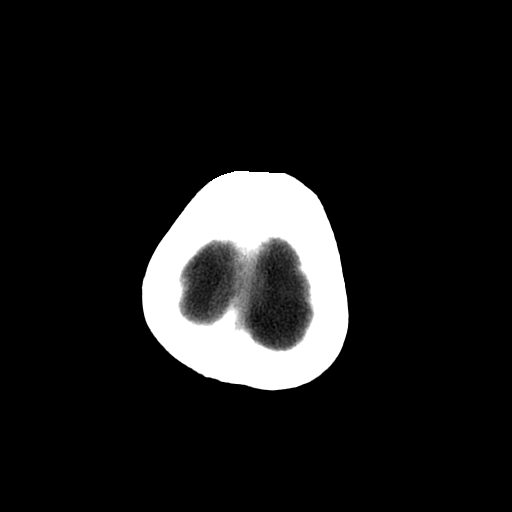

[Series 203: coronal st, idose (1) · coronal · 0.40mm/px · 3 of 85 slices shown]
[im 29/85  brain]
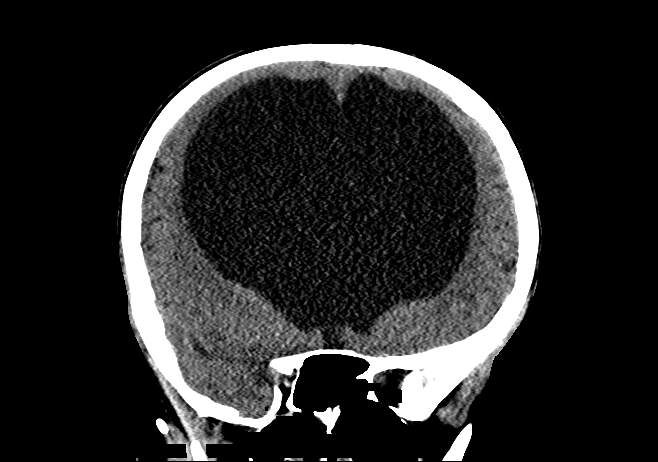
[im 38/85  brain]
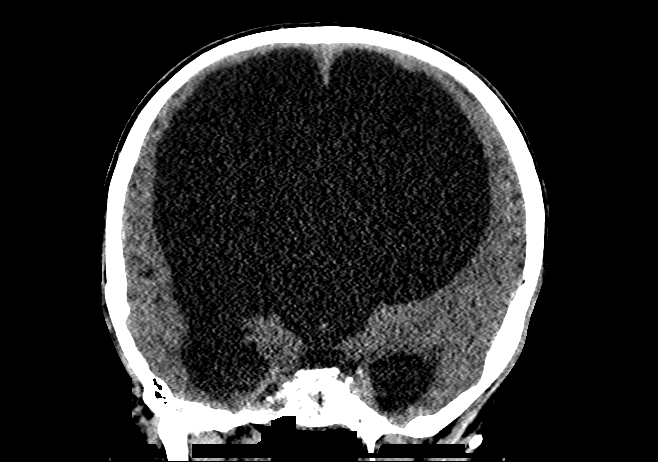
[im 47/85  brain]
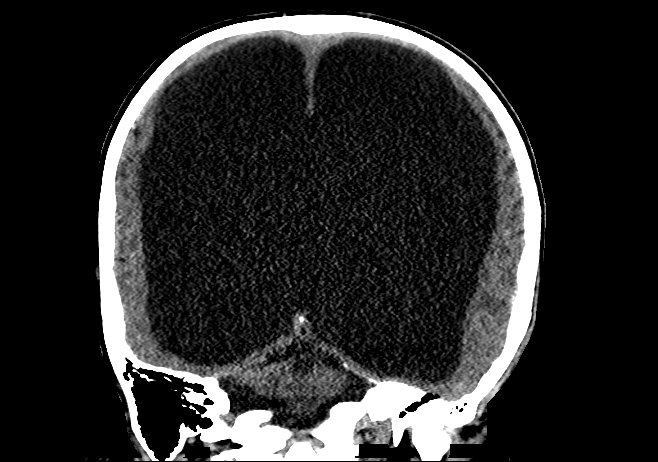

[Series 204: sagittal st, idose (1) · sagittal · 0.40mm/px · 3 of 88 slices shown]
[im 30/88  brain]
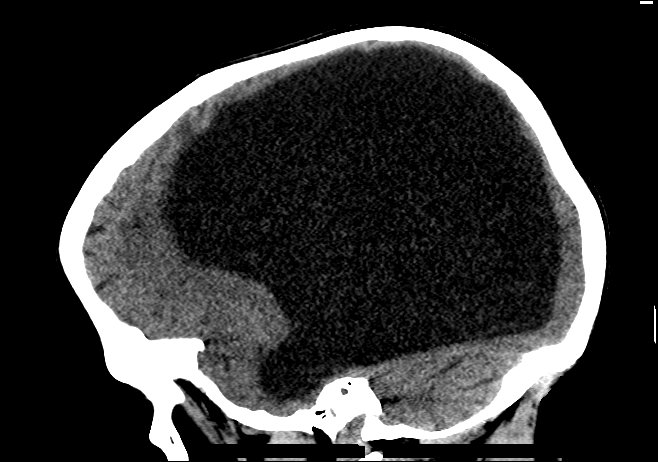
[im 44/88  brain]
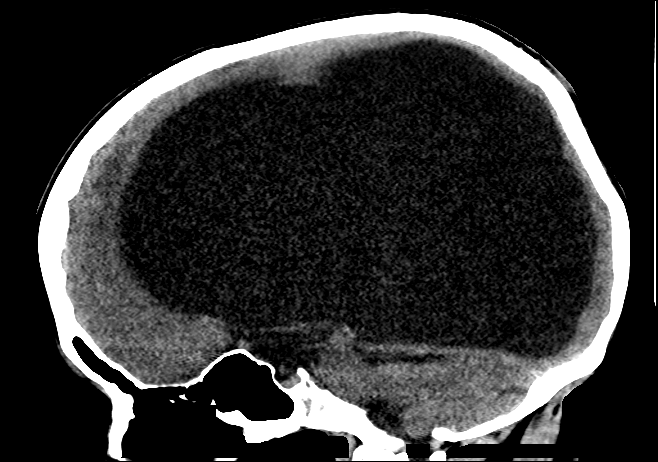
[im 59/88  brain]
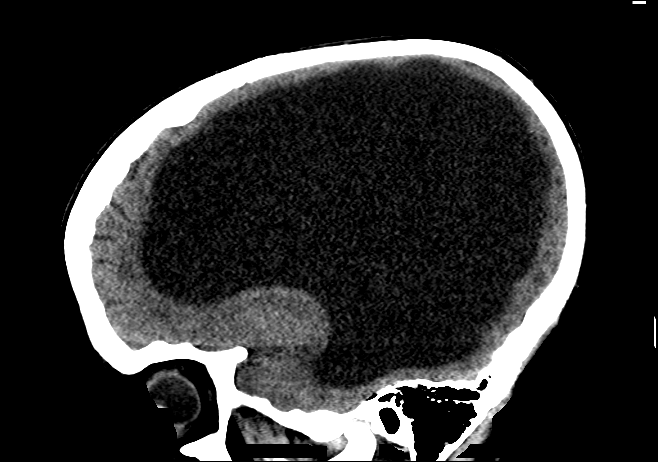

[16 of 47 positions shown; findings below may reference images not displayed]

FINDINGS: Brain: Marked chronic ventriculomegaly with prominent lateral and
third ventricles and chronic compression of cerebral tissue along
its periphery. Fourth ventricle is normal in size. Findings
consistent with aqueductal stenosis. No acute intracranial
hemorrhage, extra-axial fluid no midline shift. No acute large
vascular territory infarction.

Vascular: Moderate carotid siphon calcifications.

Skull: Nonacute

Sinuses/Orbits: Nonacute

Other: None
IMPRESSION: No acute intracranial abnormality. Chronic marked dilatation of the
lateral third ventricles with normal fourth ventricle in keeping
with aqueductal stenosis.

## 2018-05-25 ENCOUNTER — Other Ambulatory Visit: Payer: Self-pay | Admitting: Family Medicine

## 2018-06-07 ENCOUNTER — Other Ambulatory Visit: Payer: Self-pay | Admitting: Family Medicine

## 2018-06-07 NOTE — Telephone Encounter (Signed)
Name of Medication: sertraline 25 mg Name of Pharmacy:[facility]  Last Fill or Written Date and Quantity: #90 on 04/04/18 Last Office Visit and Type: 03/01/18 HFU and 10/06/17 annual exam Next Office Visit and Type: 06/11/18 3 mth FU  Please advise. Pt should have med to last until 07/04/18 and pt has appt 06/11/18.Please advise.

## 2018-06-10 ENCOUNTER — Encounter: Payer: Self-pay | Admitting: Family Medicine

## 2018-06-10 NOTE — Progress Notes (Addendum)
BP 122/78 (BP Location: Left Arm, Patient Position: Sitting, Cuff Size: Normal)   Pulse 67   Temp 98.6 F (37 C) (Oral)   Ht 5' (1.524 m)   Wt 138 lb 12 oz (62.9 kg)   SpO2 96%   BMI 27.10 kg/m    CC: 6 mo f/u visit Subjective:    Patient ID: Leah Payne, female    DOB: Jun 28, 1946, 72 y.o.   MRN: [id number]  HPI: Leah Payne is a 72 y.o. female presenting on 06/11/2018 for Follow-up (Here for 3 mo f/u. )   Dementia and h/o seizure disorder - followed by Dr Lucia Gaskins neurology on aricept 10mg  and keppra 500mg  BID. Neuropsychological testing (Dr Elvis Coil) returned showing mild-mod dementia likely Alz type. Suggested increased sertraline dose - husband increased dose to 50mg  last month without significant improvement. She also suggested further care such as an adult health day program or moving to ALF.   No falls in several months - has started using walker more regularly.   House with mold - in process of getting this fixed. If unable to fix, considering ALF. We received request from social worker at [facility] home health for placement to ALF.   Stable period on [place] - no more urinary accidents.  DM - stable on metformin 500mg  daily. Due for eye exam. Foot exam today.  Diabetic Foot Exam - Simple   Simple Foot Form Diabetic Foot exam was performed with the following findings:  Yes 06/11/2018  9:15 AM  Visual Inspection See comments:  Yes Sensation Testing Intact to touch and monofilament testing bilaterally:  Yes Pulse Check Posterior Tibialis and Dorsalis pulse intact bilaterally:  Yes Comments Thickened elongated toenails.      Relevant past medical, surgical, family and social history reviewed and updated as indicated. Interim medical history since our last visit reviewed. Allergies and medications reviewed and updated. Outpatient Medications Prior to Visit  Medication Sig Dispense Refill  . acetaminophen (TYLENOL) 500 MG tablet Take 1  tablet (500 mg total) by mouth every 8 (eight) hours as needed.  0  . atorvastatin (LIPITOR) 40 MG tablet TAKE 1 TABLET EVERY EVENING (Patient taking differently: Take 40 mg by mouth once a day) 90 tablet 3  . Cholecalciferol (VITAMIN D) 2000 units CAPS Take 1 capsule by mouth daily.    Marland Kitchen donepezil (ARICEPT) 10 MG tablet Take 1 tablet daily 90 tablet 3  . levETIRAcetam (KEPPRA) 500 MG tablet Take 1 tablet (500 mg total) by mouth 2 (two) times daily. 180 tablet 1  . levothyroxine (SYNTHROID, LEVOTHROID) 25 MCG tablet TAKE 1 TABLET DAILY BEFORE BREAKFAST. 90 tablet 0  . meclizine (ANTIVERT) 25 MG tablet TAKE 1/2 TO 1 TABLET TWICE DAILY AS NEEDED FOR DIZZINESS 60 tablet 2  . metFORMIN (GLUCOPHAGE) 500 MG tablet TAKE 1 TABLET EVERY DAY WITH BREAKFAST 90 tablet 3  . [name] 50 MG TB24 tablet TAKE 1 TABLET AT BEDTIME 90 tablet 3  . propranolol (INDERAL) 10 MG tablet TAKE 1 TABLET TWICE DAILY 180 tablet 3  . [name] 8 MG TB24 tablet TAKE 1 TABLET (8 MG TOTAL) BY MOUTH DAILY. 90 tablet 1  . fluconazole (DIFLUCAN) 150 MG tablet Take 1 tablet (150 mg total) by mouth once a week. 4 tablet 0  . sertraline (ZOLOFT) 25 MG tablet TAKE 1 TABLET EVERY DAY 90 tablet 1   No facility-administered medications prior to visit.      Per HPI unless specifically indicated in ROS section  below Review of Systems     Objective:    BP 122/78 (BP Location: Left Arm, Patient Position: Sitting, Cuff Size: Normal)   Pulse 67   Temp 98.6 F (37 C) (Oral)   Ht 5' (1.524 m)   Wt 138 lb 12 oz (62.9 kg)   SpO2 96%   BMI 27.10 kg/m   Wt Readings from Last 3 Encounters:  06/11/18 138 lb 12 oz (62.9 kg)  03/27/18 127 lb (57.6 kg)  03/01/18 137 lb (62.1 kg)    Physical Exam  Constitutional: She appears well-developed and well-nourished. No distress.  HENT:  Head: Normocephalic and atraumatic.  Right Ear: External ear normal.  Left Ear: External ear normal.  Nose: Nose normal.  Mouth/Throat: Oropharynx is clear and  moist. No oropharyngeal exudate.  Eyes: Pupils are equal, round, and reactive to light. Conjunctivae and EOM are normal. No scleral icterus.  Neck: Normal range of motion. Neck supple.  Cardiovascular: Normal rate, regular rhythm, normal heart sounds and intact distal pulses.  No murmur heard. Pulmonary/Chest: Effort normal and breath sounds normal. No respiratory distress. She has no wheezes. She has no rales.  Musculoskeletal: She exhibits no edema.  See HPI for foot exam if done  Lymphadenopathy:    She has no cervical adenopathy.  Skin: Skin is warm and dry. No rash noted.  Psychiatric: She has a normal mood and affect.  Nursing note and vitals reviewed.      Assessment & Plan:   Problem List Items Addressed This Visit    Type 2 diabetes, controlled, 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HCC) - Primary    Chronic, stable. Update A1c. Continue metformin. Encouraged they schedule eye exam.       Relevant Medications   sertraline (ZOLOFT) 25 MG tablet   Other Relevant Orders   Hemoglobin A1c   Basic metabolic panel   Overactive bladder    Stable period on toviaz and myrbetriq      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HCC)   Alzheimer disease (HCC)    Reviewed workup to date as well as neuropsychological testing.  Will increase sertraline to 75mg  daily over next few months.       Relevant Medications   sertraline (ZOLOFT) 25 MG tablet       Meds ordered this encounter  Medications  . sertraline (ZOLOFT) 25 MG tablet    Sig: Take 3 tablets (75 mg total) by mouth daily.    Dispense:  270 tablet    Refill:  1   Orders Placed This Encounter  Procedures  . Hemoglobin A1c  . Basic metabolic panel    Follow up plan: Return in about 5 months (around 11/10/2018) for annual exam, prior fasting for blood work, medicare wellness visit.  Eustaquio BoydenJavier Louetta Hollingshead, MD

## 2018-06-11 ENCOUNTER — Ambulatory Visit (INDEPENDENT_AMBULATORY_CARE_PROVIDER_SITE_OTHER): Payer: Medicare HMO | Admitting: Family Medicine

## 2018-06-11 ENCOUNTER — Encounter: Payer: Self-pay | Admitting: Family Medicine

## 2018-06-11 ENCOUNTER — Other Ambulatory Visit: Payer: Self-pay | Admitting: Family Medicine

## 2018-06-11 VITALS — BP 122/78 | HR 67 | Temp 98.6°F | Ht 60.0 in | Wt 138.8 lb

## 2018-06-11 DIAGNOSIS — Q039 Congenital hydrocephalus, unspecified: Secondary | ICD-10-CM | POA: Diagnosis not present

## 2018-06-11 DIAGNOSIS — G301 Alzheimer's disease with late onset: Secondary | ICD-10-CM

## 2018-06-11 DIAGNOSIS — F0281 Dementia in other diseases classified elsewhere with behavioral disturbance: Secondary | ICD-10-CM | POA: Diagnosis not present

## 2018-06-11 DIAGNOSIS — N3281 Overactive bladder: Secondary | ICD-10-CM | POA: Diagnosis not present

## 2018-06-11 DIAGNOSIS — E1142 Type 2 diabetes mellitus with diabetic polyneuropathy: Secondary | ICD-10-CM | POA: Diagnosis not present

## 2018-06-11 DIAGNOSIS — F02818 Dementia in other diseases classified elsewhere, unspecified severity, with other behavioral disturbance: Secondary | ICD-10-CM

## 2018-06-11 LAB — BASIC METABOLIC PANEL
BUN: 10 mg/dL (ref 6–23)
CO2: 29 mEq/L (ref 19–32)
Calcium: 9.4 mg/dL (ref 8.4–10.5)
Chloride: 99 mEq/L (ref 96–112)
Creatinine, Ser: 0.67 mg/dL (ref 0.40–1.20)
GFR: 91.66 mL/min (ref >60.00)
Glucose, Bld: 103 mg/dL — ABNORMAL HIGH (ref 70–99)
Potassium: 4.7 mEq/L (ref 3.5–5.1)
Sodium: 135 mEq/L (ref 135–145)

## 2018-06-11 LAB — HEMOGLOBIN A1C: Hgb A1c MFr Bld: 5.9 % (ref 4.6–6.5)

## 2018-06-11 MED ORDER — SERTRALINE HCL 25 MG PO TABS: 75.0000 mg | ORAL_TABLET | Freq: Every day | ORAL | 1 refills | Status: DC

## 2018-06-11 NOTE — Patient Instructions (Addendum)
Labs today Continue current medicines - increase sertraline to 75mg  daily.  Schedule eye exam whenever you're able.  Return in 5-6 months for wellness visit and physical .

## 2018-06-11 NOTE — Assessment & Plan Note (Signed)
Chronic, stable. Update A1c. Continue metformin. Encouraged they schedule eye exam.

## 2018-06-11 NOTE — Assessment & Plan Note (Signed)
Reviewed workup to date as well as neuropsychological testing.  Will increase sertraline to 75mg  daily over next few months.

## 2018-06-11 NOTE — Assessment & Plan Note (Signed)
Stable period on toviaz and myrbetriq 

## 2018-06-25 ENCOUNTER — Other Ambulatory Visit: Payer: Self-pay | Admitting: Family Medicine

## 2018-06-25 NOTE — Telephone Encounter (Signed)
Last office visit 06/11/2018 for DM.  Last refilled 02/09/2018 for #180 with 1 refill.  Next appt: 10/09/2018 for CPE.

## 2018-06-28 ENCOUNTER — Telehealth: Payer: Self-pay | Admitting: Family Medicine

## 2018-06-28 NOTE — Telephone Encounter (Signed)
Shayla/[facility] called to confirm if Dr.Gutierrez has received a fax for orders of speech therapy, plan of care, OT, medical social work, and home health that was sent on 05/30/18. Did advise to resend the fax.

## 2018-06-28 NOTE — Telephone Encounter (Signed)
Dr. Reece AgarG do you have these orders?  I have not seen them come through the tower.

## 2018-06-29 NOTE — Telephone Encounter (Addendum)
Noted. I will let Shayla know.  Spoke with Drema PryShayla, of [facility], informing her we have not received any faxed orders for pt.  Says she will refax them now and asks they be signed and faxed back asap.

## 2018-06-29 NOTE — Telephone Encounter (Signed)
I did not receive these

## 2018-07-11 ENCOUNTER — Other Ambulatory Visit: Payer: Self-pay | Admitting: Family Medicine

## 2018-08-02 ENCOUNTER — Telehealth: Payer: Self-pay | Admitting: Family Medicine

## 2018-08-02 MED ORDER — MIRABEGRON ER 50 MG PO TB24: 50.0000 mg | ORAL_TABLET | Freq: Every day | ORAL | 0 refills | Status: DC

## 2018-08-02 NOTE — Telephone Encounter (Signed)
Pt's husband came into office states he can't get in touch with pharmacy and needs a refill for Leah Payne on [name] 50 mg. He said she has been without medicine for a week now. Please refill.

## 2018-08-02 NOTE — Telephone Encounter (Signed)
Spoke with Norma Fredrickson Tech with [facility], asking about pt's [name] rx.  According to our records, pt should have refills through 10/2018. Per Lyla Son, the [name] was discontinued because they thought the [name] was replacing it. I checked last OV note concerning med [see OV, 06/11/18] and Dr. [name] states pt is stable on both meds.  Lyla Son apologized for the mix up and requests a new rx be sent in.   E-scribed new rx.  Left message on vm per dpr notifying pt's husband, [name] (on dpr), that Francine Graven is sending and shipment of [name] for the pt.

## 2018-08-13 DIAGNOSIS — G309 Alzheimer's disease, unspecified: Secondary | ICD-10-CM | POA: Diagnosis not present

## 2018-08-13 DIAGNOSIS — E1142 Type 2 diabetes mellitus with diabetic polyneuropathy: Secondary | ICD-10-CM | POA: Diagnosis not present

## 2018-08-13 DIAGNOSIS — I10 Essential (primary) hypertension: Secondary | ICD-10-CM | POA: Diagnosis not present

## 2018-08-14 DIAGNOSIS — G309 Alzheimer's disease, unspecified: Secondary | ICD-10-CM | POA: Diagnosis not present

## 2018-08-14 DIAGNOSIS — I10 Essential (primary) hypertension: Secondary | ICD-10-CM | POA: Diagnosis not present

## 2018-08-14 DIAGNOSIS — E1142 Type 2 diabetes mellitus with diabetic polyneuropathy: Secondary | ICD-10-CM | POA: Diagnosis not present

## 2018-08-15 DIAGNOSIS — G309 Alzheimer's disease, unspecified: Secondary | ICD-10-CM | POA: Diagnosis not present

## 2018-08-15 DIAGNOSIS — E1142 Type 2 diabetes mellitus with diabetic polyneuropathy: Secondary | ICD-10-CM | POA: Diagnosis not present

## 2018-08-15 DIAGNOSIS — I10 Essential (primary) hypertension: Secondary | ICD-10-CM | POA: Diagnosis not present

## 2018-08-16 DIAGNOSIS — G309 Alzheimer's disease, unspecified: Secondary | ICD-10-CM | POA: Diagnosis not present

## 2018-08-16 DIAGNOSIS — E1142 Type 2 diabetes mellitus with diabetic polyneuropathy: Secondary | ICD-10-CM | POA: Diagnosis not present

## 2018-08-16 DIAGNOSIS — I10 Essential (primary) hypertension: Secondary | ICD-10-CM | POA: Diagnosis not present

## 2018-08-17 DIAGNOSIS — E1142 Type 2 diabetes mellitus with diabetic polyneuropathy: Secondary | ICD-10-CM | POA: Diagnosis not present

## 2018-08-17 DIAGNOSIS — G309 Alzheimer's disease, unspecified: Secondary | ICD-10-CM | POA: Diagnosis not present

## 2018-08-17 DIAGNOSIS — I10 Essential (primary) hypertension: Secondary | ICD-10-CM | POA: Diagnosis not present

## 2018-08-20 DIAGNOSIS — G309 Alzheimer's disease, unspecified: Secondary | ICD-10-CM | POA: Diagnosis not present

## 2018-08-20 DIAGNOSIS — E1142 Type 2 diabetes mellitus with diabetic polyneuropathy: Secondary | ICD-10-CM | POA: Diagnosis not present

## 2018-08-20 DIAGNOSIS — I10 Essential (primary) hypertension: Secondary | ICD-10-CM | POA: Diagnosis not present

## 2018-08-21 DIAGNOSIS — G309 Alzheimer's disease, unspecified: Secondary | ICD-10-CM | POA: Diagnosis not present

## 2018-08-21 DIAGNOSIS — I10 Essential (primary) hypertension: Secondary | ICD-10-CM | POA: Diagnosis not present

## 2018-08-21 DIAGNOSIS — E1142 Type 2 diabetes mellitus with diabetic polyneuropathy: Secondary | ICD-10-CM | POA: Diagnosis not present

## 2018-08-23 DIAGNOSIS — G309 Alzheimer's disease, unspecified: Secondary | ICD-10-CM | POA: Diagnosis not present

## 2018-08-23 DIAGNOSIS — E1142 Type 2 diabetes mellitus with diabetic polyneuropathy: Secondary | ICD-10-CM | POA: Diagnosis not present

## 2018-08-23 DIAGNOSIS — I10 Essential (primary) hypertension: Secondary | ICD-10-CM | POA: Diagnosis not present

## 2018-08-24 DIAGNOSIS — E1142 Type 2 diabetes mellitus with diabetic polyneuropathy: Secondary | ICD-10-CM | POA: Diagnosis not present

## 2018-08-24 DIAGNOSIS — I10 Essential (primary) hypertension: Secondary | ICD-10-CM | POA: Diagnosis not present

## 2018-08-24 DIAGNOSIS — G309 Alzheimer's disease, unspecified: Secondary | ICD-10-CM | POA: Diagnosis not present

## 2018-08-27 DIAGNOSIS — E1142 Type 2 diabetes mellitus with diabetic polyneuropathy: Secondary | ICD-10-CM | POA: Diagnosis not present

## 2018-08-27 DIAGNOSIS — I10 Essential (primary) hypertension: Secondary | ICD-10-CM | POA: Diagnosis not present

## 2018-08-27 DIAGNOSIS — G309 Alzheimer's disease, unspecified: Secondary | ICD-10-CM | POA: Diagnosis not present

## 2018-08-28 DIAGNOSIS — I10 Essential (primary) hypertension: Secondary | ICD-10-CM | POA: Diagnosis not present

## 2018-08-28 DIAGNOSIS — G309 Alzheimer's disease, unspecified: Secondary | ICD-10-CM | POA: Diagnosis not present

## 2018-08-28 DIAGNOSIS — E1142 Type 2 diabetes mellitus with diabetic polyneuropathy: Secondary | ICD-10-CM | POA: Diagnosis not present

## 2018-08-29 DIAGNOSIS — I10 Essential (primary) hypertension: Secondary | ICD-10-CM | POA: Diagnosis not present

## 2018-08-29 DIAGNOSIS — G309 Alzheimer's disease, unspecified: Secondary | ICD-10-CM | POA: Diagnosis not present

## 2018-08-29 DIAGNOSIS — E1142 Type 2 diabetes mellitus with diabetic polyneuropathy: Secondary | ICD-10-CM | POA: Diagnosis not present

## 2018-08-30 DIAGNOSIS — E1142 Type 2 diabetes mellitus with diabetic polyneuropathy: Secondary | ICD-10-CM | POA: Diagnosis not present

## 2018-08-30 DIAGNOSIS — I10 Essential (primary) hypertension: Secondary | ICD-10-CM | POA: Diagnosis not present

## 2018-08-30 DIAGNOSIS — G309 Alzheimer's disease, unspecified: Secondary | ICD-10-CM | POA: Diagnosis not present

## 2018-08-31 DIAGNOSIS — E1142 Type 2 diabetes mellitus with diabetic polyneuropathy: Secondary | ICD-10-CM | POA: Diagnosis not present

## 2018-08-31 DIAGNOSIS — I10 Essential (primary) hypertension: Secondary | ICD-10-CM | POA: Diagnosis not present

## 2018-08-31 DIAGNOSIS — G309 Alzheimer's disease, unspecified: Secondary | ICD-10-CM | POA: Diagnosis not present

## 2018-09-03 DIAGNOSIS — E1142 Type 2 diabetes mellitus with diabetic polyneuropathy: Secondary | ICD-10-CM | POA: Diagnosis not present

## 2018-09-03 DIAGNOSIS — G309 Alzheimer's disease, unspecified: Secondary | ICD-10-CM | POA: Diagnosis not present

## 2018-09-03 DIAGNOSIS — I10 Essential (primary) hypertension: Secondary | ICD-10-CM | POA: Diagnosis not present

## 2018-09-04 DIAGNOSIS — I10 Essential (primary) hypertension: Secondary | ICD-10-CM | POA: Diagnosis not present

## 2018-09-04 DIAGNOSIS — G309 Alzheimer's disease, unspecified: Secondary | ICD-10-CM | POA: Diagnosis not present

## 2018-09-04 DIAGNOSIS — E1142 Type 2 diabetes mellitus with diabetic polyneuropathy: Secondary | ICD-10-CM | POA: Diagnosis not present

## 2018-09-05 DIAGNOSIS — E1142 Type 2 diabetes mellitus with diabetic polyneuropathy: Secondary | ICD-10-CM | POA: Diagnosis not present

## 2018-09-05 DIAGNOSIS — I10 Essential (primary) hypertension: Secondary | ICD-10-CM | POA: Diagnosis not present

## 2018-09-05 DIAGNOSIS — G309 Alzheimer's disease, unspecified: Secondary | ICD-10-CM | POA: Diagnosis not present

## 2018-09-06 ENCOUNTER — Other Ambulatory Visit: Payer: Self-pay | Admitting: Family Medicine

## 2018-09-06 ENCOUNTER — Other Ambulatory Visit: Payer: Self-pay | Admitting: Neurology

## 2018-09-06 DIAGNOSIS — G309 Alzheimer's disease, unspecified: Secondary | ICD-10-CM | POA: Diagnosis not present

## 2018-09-06 DIAGNOSIS — I10 Essential (primary) hypertension: Secondary | ICD-10-CM | POA: Diagnosis not present

## 2018-09-06 DIAGNOSIS — E1142 Type 2 diabetes mellitus with diabetic polyneuropathy: Secondary | ICD-10-CM | POA: Diagnosis not present

## 2018-09-26 ENCOUNTER — Ambulatory Visit: Payer: Medicare HMO | Admitting: Neurology

## 2018-10-06 ENCOUNTER — Other Ambulatory Visit: Payer: Self-pay | Admitting: Family Medicine

## 2018-10-09 ENCOUNTER — Ambulatory Visit: Payer: Medicare HMO

## 2018-10-09 ENCOUNTER — Encounter: Payer: Medicare HMO | Admitting: Family Medicine

## 2018-10-11 ENCOUNTER — Other Ambulatory Visit: Payer: Self-pay | Admitting: Family Medicine

## 2018-10-15 ENCOUNTER — Telehealth: Payer: Self-pay | Admitting: Family Medicine

## 2018-10-15 MED ORDER — FESOTERODINE FUMARATE ER 8 MG PO TB24: ORAL_TABLET | ORAL | 1 refills | Status: DC

## 2018-10-15 MED ORDER — MIRABEGRON ER 50 MG PO TB24: 50.0000 mg | ORAL_TABLET | Freq: Every day | ORAL | 1 refills | Status: DC

## 2018-10-15 NOTE — Telephone Encounter (Signed)
plz notify this was E prescribed 

## 2018-10-15 NOTE — Telephone Encounter (Signed)
Spoke with pt's husband, [name] (on dpr), notifying him pt's rxs have been refilled. Expresses his thanks

## 2018-10-15 NOTE — Telephone Encounter (Signed)
Pt spouse called to request a refill of [name] 8mg  and Myrbetiq 50mg . Pharmacy said expired. Pharmacy- [facility] [address] [place] Pt is out of these meds

## 2018-10-28 ENCOUNTER — Other Ambulatory Visit: Payer: Self-pay | Admitting: Family Medicine

## 2018-10-30 ENCOUNTER — Other Ambulatory Visit: Payer: Self-pay | Admitting: Family Medicine

## 2018-10-30 NOTE — Telephone Encounter (Signed)
Electronic refill request meclizine Last office visit 06/11/18 Last refill 10/08/18 #60

## 2018-11-04 ENCOUNTER — Other Ambulatory Visit: Payer: Self-pay | Admitting: Family Medicine

## 2018-11-05 ENCOUNTER — Other Ambulatory Visit: Payer: Self-pay | Admitting: Family Medicine

## 2018-11-06 NOTE — Telephone Encounter (Signed)
Keppra Last filled:  07/03/18, #180 Last OV:  06/11/18, f/u Next OV:  01/31/19, AWV

## 2018-11-14 ENCOUNTER — Other Ambulatory Visit: Payer: Self-pay | Admitting: Family Medicine

## 2018-11-14 NOTE — Telephone Encounter (Signed)
Sertraline  Last refill: 06/11/18 #270, 1 refill  Last OV: 06/11/18 f/u Next appt: 7/ 9/20 AWV  Will refill x 1 , 90 day supply to carry through AWV appt.

## 2018-11-29 ENCOUNTER — Telehealth: Payer: Self-pay | Admitting: *Deleted

## 2018-11-29 ENCOUNTER — Encounter: Payer: Self-pay | Admitting: *Deleted

## 2018-11-29 NOTE — Telephone Encounter (Signed)
Spoke with pt's husband Jerl Santos (on [place]) and discussed that d/t the COVID 19 panemic, our office is severely reducing in person visits and is recommending telemedicine. The pt's husband verbalized understanding and provided consent to the telephone visit and to file it with insurance. He understands there are imitations to the physical assessment. He confirmed pt's name & DOB and updated her chart. No changes to PMH. No changes to meds. Pt has no known allergies. He understands he will receive a call to check in around 8:00 Monday morning 5/11 followed by Dr. Trevor Mace call at about 8:30 AM. Advised him to have phone on speaker phone with pt. He verbalized appreciation for the call.

## 2018-12-02 NOTE — Progress Notes (Signed)
[facility]    Provider:  Dr Lucia Gaskins Referring Provider: Eustaquio Boyden, MD Primary Care Physician:  Eustaquio Boyden, MD  CC:  Memory loss  Virtual Visit via Telephone Note  I connected with [name] and her husband [name] on 12/03/18 at  8:30 AM EDT by telephone and verified that I am speaking with the correct person using two identifiers.  Location: Patient: Home Provider: In the office   I discussed the limitations, risks, security and privacy concerns of performing an evaluation and management service by telephone and the availability of in person appointments. I also discussed with the patient that there may be a patient responsible charge related to this service. The patient expressed understanding and agreed to proceed.  Follow Up Instructions:    I discussed the assessment and treatment plan with the patient. The patient was provided an opportunity to ask questions and all were answered. The patient agreed with the plan and demonstrated an understanding of the instructions.   The patient was advised to call back or seek an in-person evaluation if the symptoms worsen or if the condition fails to improve as anticipated.  I provided 22 minutes of non-face-to-face time during this encounter.   Anson Fret, MD  Interval 12/03/2018: Patient with mild-mod Alzheimer's disease diagnosed by formal neurocognitive testing 04/26/2018 by Dr. Alinda Dooms. She also has PMHx seizure, hydrocephalus and fainting, DM2. She has had her sertraline  increased since last being seen. B12 was last 637, TSH normal, HgbA1c 6.1 and her diabetes is managed by her pcp. LDL 57. NA was 130.  She doesn't acknowledge and denies having dementia. Husband has been in the hospital a few times 2 weeks in March after falling. People from church are helping. It is a strain on him physically and mentally. They have medicaid. Discussed a memory unit. Discussed adult day centers. Patient  can;t hardly walk at all. She leans over. She refuses the walker. She can't get in the shower alone, husband has to help with all ADLs. She has fallen. Memory declining. Needs social work. They are looking for someplace to move.He needs help with services in the area. Patient sleeps all the time, sleeps more than she is awake. She sleeps from 7pm-930am and then naps for 3 hours. She is apathetic. She appears depressed. Zoloft not helping.I advised them to find a day center if possible during the Covid 19 andemic.Also she has medicaid and they may help with nursing twice a week.   Megan Millikan's last note: HISTORY OF PRESENT ILLNESS: 03/27/18:  Ms. Lentsch is a 73 year old female with a history of memory disturbance.  She returns today for follow-up.  Her other reports that her memory continues to decline.  She does require assistance with ADLs.  She had a fall approximately 4 to 5 weeks ago and was in the hospital briefly.  They recommended home therapy.  She does have a therapist that coming throughout the week.  They looking at getting help in the home regularly after therapy is.  Husband reports that her appetite has decreased.  He reports that she sleeps a lot during the day.  He denies any seizure events.  She continues on Keppra.  She did see Dr. Rolly Pancake heath  for neurocognitive testing.  However she has a follow-up in October to review those results.  She returns today for follow-up.  HPI 09/19/2017:  DIANEY SUCHY is a 73 y.o. female here as a referral from Dr. Sharen Hones for memory problems.  PMHx seizure, hydrocephalus and fainting, DM2.  She had a possible seizure in 2015 but no showed her neurology appointment with Dr. Karel Jarvis. She has an MRI scheduled. She had a possible seizure in 2015 and has stayed on the Keppra.No more episodes since then. She has chronic dizziness. She is with her husband who provides most information. It is more her short-term memory. She can't remember immediate things.  Started about 4 years ago after the seizure. Husband feels as this is progressive. She denies anything is wrong. Memory is worsening. Knows the year. She remembers birthdays. She forget events at church. Married for 55 years. Husband has always been responsible for bills. She has never driven. Husband goes to the grocery store, he started going for her in the 90s he said she couldn't do it, husband does the cooking. Her father had Alzheimer's disease. Around 69 or 70. More short-term memory loss. She does not cook anymore, husband is concerned if she remembers. She can heat up coffee in the microwave. She repeats herself and asks the same questions in the same day. She acts normal with other people. Having difficulty walking. Can't pick feet up. No tremors. No hallucinations or delusions. No alcohol. She likes to go out, she is still social. No mood changes but she does worry a lot. Progressive. Patient denies any problems. Husband has to manage her medications.  Reviewed notes, labs and imaging from outside physicians, which showed:  B12, hemoglobin A1c, normal in July 2018, TSH normal in February 2018.  Reviewed emergency room notes in 2015.  Suspected new onset seizures.  They were at church, the husband was behind the pulpit performing a reading while is why set him up use, noticed her arm starting to [place] and she subsequently passed out.  She was somewhat difficult to arouse and appeared confused.  In the emergency department and nurse witnessed another similar episode. She had no previous seizure history. She was continued on Keppra, EEG and MRI ordered.  EEG was nonspecific but did not show evidence of an epileptic disorder.  CT scan of the head showed marketed chronic ventriculomegaly with prominent lateral and third ventricles and chronic compression of cerebral tissue along its periphery.  Fourth ventricle is normal in size.  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  Review of Systems: Patient complains of symptoms per HPI as well as the following symptoms: Memory loss, confusion, sleepiness, weakness, dizziness, seizure, too much sleep, feeling cold, increased thirst, spinning sensation, fatigue. Pertinent negatives and positives per HPI. All others negative.   Social History   Socioeconomic History   Marital status: Married    Spouse name: Not on file   Number of children: Not on file   Years of education: Not on file   Highest education level: High school graduate  Occupational History   Not on file  Social Needs   Financial resource strain: Not on file   Food insecurity:    Worry: Not on file    Inability: Not on file   Transportation needs:    Medical: Not on file    Non-medical: Not on file  Tobacco Use   Smoking status: Never Smoker   Smokeless tobacco: Never Used  Substance and Sexual Activity   Alcohol use: No    Alcohol/week: 0.0 standard drinks   Drug use: No   Sexual activity: Not on file  Lifestyle   Physical activity:    Days per week: Not on file  Minutes per session: Not on file   Stress: Not on file  Relationships   Social connections:    Talks on phone: Not on file    Gets together: Not on file    Attends religious service: Not on file    Active member of club or organization: Not on file    Attends meetings of clubs or organizations: Not on file    Relationship status: Not on file   Intimate partner violence:    Fear of current or ex partner: Not on file    Emotionally abused: Not on file    Physically abused: Not on file    Forced sexual activity: Not on file  Other Topics Concern   Not on file  Social History Narrative   Lives with husband, 2 dogs   [place]   Occupation: retired, was housewife   Edu: completed HS    Activity: no regular exercise. Dizziness precludes activity   Diet: good water, fruits/vegetables daily   Caffeine: 2 cups daily    Family  History  Problem Relation Age of Onset   Stroke Mother [id number]   Pneumonia Mother    CAD Father [id number]   Alzheimer's disease Father    Pneumonia Father    Cancer Maternal Aunt        lung (non-smoker)   Diabetes Neg Hx     Past Medical History:  Diagnosis Date   Anomaly of cranium    frontal bossing, enlarged cranium, adult strabismus   Diabetes mellitus type 2, controlled (HCC) 08/04/2013   Heart disease    History of chicken pox    History of fainting    Hydrocephalus in adult [facility](HCC) 08/29/2014   CT head showing obstructive hydrocephalus, 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 (Patient not taking: Reported on 11/29/2018)  0   atorvastatin (LIPITOR) 40 MG tablet TAKE 1 TABLET EVERY DAY 90 tablet 0   Cholecalciferol (VITAMIN D) 2000 units CAPS Take 1 capsule by mouth daily.     donepezil (ARICEPT) 10 MG tablet TAKE 1 TABLET EVERY DAY 90 tablet 1   fesoterodine ([name]) 8 MG TB24 tablet TAKE 1 TABLET (8 MG TOTAL) BY MOUTH DAILY. 90 tablet 1   levETIRAcetam (KEPPRA) 500 MG tablet TAKE 1 TABLET TWICE DAILY 180 tablet 0   levothyroxine (SYNTHROID, LEVOTHROID) 25 MCG tablet TAKE 1 TABLET DAILY BEFORE BREAKFAST. 90 tablet 0   meclizine (ANTIVERT) 25 MG tablet TAKE 1/2 TO 1 TABLET TWICE DAILY AS NEEDED FOR DIZZINESS (Patient not taking: Reported on 11/29/2018) 60 tablet 0   metFORMIN (GLUCOPHAGE) 500 MG tablet TAKE 1  TABLET EVERY DAY WITH BREAKFAST 90 tablet 0   mirabegron ER ([name]) 50 MG TB24 tablet Take 1 tablet (50 mg total) by mouth at bedtime. 90 tablet 1   propranolol (INDERAL) 10 MG tablet TAKE 1 TABLET TWICE DAILY 180 tablet 0   sertraline (ZOLOFT) 25 MG tablet  TAKE 3 TABLETS EVERY DAY 270 tablet 0   No current facility-administered medications for this visit.     Allergies as of 12/03/2018   (No Known Allergies)    Vitals: There were no vitals taken for this visit. Last Weight:  Wt Readings from Last 1 Encounters:  06/11/18 138 lb 12 oz (62.9 kg)   Last Height:   Ht Readings from Last 1 Encounters:  06/11/18 5' (1.524 m)   PRIOR EXAM:  Physical exam: Exam: Gen: NAD, conversant, well nourised, obese, well groomed                     CV: RRR, no MRG. No Carotid Bruits. No peripheral edema, warm, nontender Eyes: Conjunctivae clear without exudates or hemorrhage  Neuro: Detailed Neurologic Exam  Speech:    Speech is normal; fluent and spontaneous with normal comprehension.  Cognition:    The patient is oriented to person, place, and time;     recent and remote memory intact;     language fluent;     normal attention, concentration,     fund of knowledge Cranial Nerves:    The pupils are equal, round, and reactive to light. Attempted fundoscopic exam could not visualize.  Visual fields are full to finger confrontation. Extraocular movements are intact. Trigeminal sensation is intact and the muscles of mastication are normal. The face is symmetric. The palate elevates in the midline. Hearing intact. Voice is normal. Shoulder shrug is normal. The tongue has normal motion without fasciculations.   Coordination:    Normal finger to nose   Gait:    Wide-based, low clearance  Motor Observation:     no involuntary movements noted. Tone:     Increased tone in the lower extremities vs paratonia  Posture:    Slightly stooped    Strength:    Difficult strength exam due  to cognitive difficulties but strength appears antigravity and symmetric     Sensation: intact to LT     Reflex Exam:  DTR's:    Deep tendon reflexes in the upper and lower extremities are brisk bilaterally.   Toes:    Right may be upgoing   Clonus:    Clonus is absent.    Assessment/Plan:  73 year old with cognitive decline FHx of Alzheimer's. Alzheimer's dementia, with a contribution from severe congenital hydrocephalus.   - declining, can't walk anymore, poor appetite, losing weight, worsening cognitive abilites, husband has to help her with all ADLs. He declines getting hospice involved.  - Need social worker to help with resources in the area, to help with discussing if hospice would be helpful, to assess patient and help with advice on medicaid services for patient as medicaid may be able to provide help a few times a week.   Seizures: Continue Keppra  Dementia: Continue Aricept and Zoloft  Meds ordered this encounter  Medications   levETIRAcetam (KEPPRA) 500 MG tablet    Sig: Take 1 tablet (500 mg total) by mouth 2 (two) times daily.    Dispense:  180 tablet    Refill:  4   donepezil (ARICEPT) 10 MG tablet    Sig: TAKE 1 TABLET EVERY DAY    Dispense:  90 tablet    Refill:  4   Will order Social Services Naomie Dean, MD  [facility] [address] [place], [place] [address]  Phone [phone] Fax [phone]

## 2018-12-03 ENCOUNTER — Telehealth: Payer: Self-pay | Admitting: Neurology

## 2018-12-03 ENCOUNTER — Other Ambulatory Visit: Payer: Self-pay

## 2018-12-03 ENCOUNTER — Ambulatory Visit (INDEPENDENT_AMBULATORY_CARE_PROVIDER_SITE_OTHER): Payer: Medicare HMO | Admitting: Neurology

## 2018-12-03 DIAGNOSIS — F02818 Dementia in other diseases classified elsewhere, unspecified severity, with other behavioral disturbance: Secondary | ICD-10-CM

## 2018-12-03 DIAGNOSIS — F0281 Dementia in other diseases classified elsewhere with behavioral disturbance: Secondary | ICD-10-CM

## 2018-12-03 DIAGNOSIS — G301 Alzheimer's disease with late onset: Secondary | ICD-10-CM | POA: Diagnosis not present

## 2018-12-03 DIAGNOSIS — G40909 Epilepsy, unspecified, not intractable, without status epilepticus: Secondary | ICD-10-CM

## 2018-12-03 MED ORDER — DONEPEZIL HCL 10 MG PO TABS: ORAL_TABLET | ORAL | 4 refills | Status: DC

## 2018-12-03 MED ORDER — LEVETIRACETAM 500 MG PO TABS: 500.0000 mg | ORAL_TABLET | Freq: Two times a day (BID) | ORAL | 4 refills | Status: DC

## 2018-12-03 NOTE — Telephone Encounter (Signed)
Leah Payne, can you tell me what order to place for the following services that patient needs for dementia:  - Need social worker to help with resources in the area, to help with discussing if hospice would be helpful, to assess patient and help with advice on medicaid services for patient as medicaid may be able to provide help a few times a week.

## 2018-12-03 NOTE — Telephone Encounter (Signed)
Noted Checking on this.

## 2018-12-05 ENCOUNTER — Other Ambulatory Visit: Payer: Self-pay | Admitting: *Deleted

## 2018-12-05 DIAGNOSIS — G301 Alzheimer's disease with late onset: Secondary | ICD-10-CM

## 2018-12-05 DIAGNOSIS — F0281 Dementia in other diseases classified elsewhere with behavioral disturbance: Secondary | ICD-10-CM

## 2018-12-05 DIAGNOSIS — F02818 Dementia in other diseases classified elsewhere, unspecified severity, with other behavioral disturbance: Secondary | ICD-10-CM

## 2018-12-05 NOTE — Telephone Encounter (Signed)
Leah Payne , Home Health orders and put in comments in bold  Yes have them write a order for nursing and speech therapy for dementia  And social work

## 2018-12-05 NOTE — Telephone Encounter (Signed)
Home health order placed.

## 2018-12-05 NOTE — Telephone Encounter (Signed)
[place] - hey can u ask dana about this and place the orders please? Are the orders via home health?

## 2018-12-05 NOTE — Telephone Encounter (Signed)
Leah Payne, Leah Payne, Annabelle Harman R        Yes have them write a order for nursing and speech therapy for dementia  And social work

## 2018-12-06 ENCOUNTER — Telehealth: Payer: Self-pay | Admitting: Family Medicine

## 2018-12-06 ENCOUNTER — Telehealth: Payer: Self-pay | Admitting: *Deleted

## 2018-12-06 DIAGNOSIS — Z743 Need for continuous supervision: Secondary | ICD-10-CM | POA: Diagnosis not present

## 2018-12-06 DIAGNOSIS — E119 Type 2 diabetes mellitus without complications: Secondary | ICD-10-CM | POA: Diagnosis not present

## 2018-12-06 DIAGNOSIS — Z7984 Long term (current) use of oral hypoglycemic drugs: Secondary | ICD-10-CM | POA: Diagnosis not present

## 2018-12-06 DIAGNOSIS — F0280 Dementia in other diseases classified elsewhere without behavioral disturbance: Secondary | ICD-10-CM | POA: Diagnosis not present

## 2018-12-06 DIAGNOSIS — R2681 Unsteadiness on feet: Secondary | ICD-10-CM | POA: Diagnosis not present

## 2018-12-06 DIAGNOSIS — R41841 Cognitive communication deficit: Secondary | ICD-10-CM | POA: Diagnosis not present

## 2018-12-06 DIAGNOSIS — G301 Alzheimer's disease with late onset: Secondary | ICD-10-CM | POA: Diagnosis not present

## 2018-12-06 DIAGNOSIS — G40909 Epilepsy, unspecified, not intractable, without status epilepticus: Secondary | ICD-10-CM | POA: Diagnosis not present

## 2018-12-06 NOTE — Telephone Encounter (Signed)
Spoke to [place] [facility]:  Verbal okay given for additional nursing visits and asked for clarity around bp reading.  BP was slightly elevated today at 150/94, HR 80.  Patient exhibited no symptoms (ie headache, dizziness, shortness or breath, etc..).    Please let [facility] know if anything further needed on blood pressure otherwise just an FYI.

## 2018-12-06 NOTE — Telephone Encounter (Signed)
Best number [phone]  [facility]  1)   bp 150/94  2) additional nursing visit      2 times week for 2 weeks      1 time week for 4 weeks

## 2018-12-06 NOTE — Telephone Encounter (Signed)
Spoke with pt's husband [name] and scheduled 6 mo f/u visit with Dr. Lucia Gaskins on Wed 06/05/2019 @ 10:30 AM arrival 10:00 AM.

## 2018-12-07 DIAGNOSIS — R41841 Cognitive communication deficit: Secondary | ICD-10-CM | POA: Diagnosis not present

## 2018-12-07 DIAGNOSIS — Z743 Need for continuous supervision: Secondary | ICD-10-CM | POA: Diagnosis not present

## 2018-12-07 DIAGNOSIS — G40909 Epilepsy, unspecified, not intractable, without status epilepticus: Secondary | ICD-10-CM | POA: Diagnosis not present

## 2018-12-07 DIAGNOSIS — R2681 Unsteadiness on feet: Secondary | ICD-10-CM | POA: Diagnosis not present

## 2018-12-07 DIAGNOSIS — G301 Alzheimer's disease with late onset: Secondary | ICD-10-CM | POA: Diagnosis not present

## 2018-12-07 DIAGNOSIS — Z7984 Long term (current) use of oral hypoglycemic drugs: Secondary | ICD-10-CM | POA: Diagnosis not present

## 2018-12-07 DIAGNOSIS — E119 Type 2 diabetes mellitus without complications: Secondary | ICD-10-CM | POA: Diagnosis not present

## 2018-12-07 DIAGNOSIS — F0280 Dementia in other diseases classified elsewhere without behavioral disturbance: Secondary | ICD-10-CM | POA: Diagnosis not present

## 2018-12-07 NOTE — Telephone Encounter (Signed)
Noted. Agree with verbal order. No changes recommended.

## 2018-12-11 DIAGNOSIS — R2681 Unsteadiness on feet: Secondary | ICD-10-CM | POA: Diagnosis not present

## 2018-12-11 DIAGNOSIS — Z743 Need for continuous supervision: Secondary | ICD-10-CM | POA: Diagnosis not present

## 2018-12-11 DIAGNOSIS — F0280 Dementia in other diseases classified elsewhere without behavioral disturbance: Secondary | ICD-10-CM | POA: Diagnosis not present

## 2018-12-11 DIAGNOSIS — G301 Alzheimer's disease with late onset: Secondary | ICD-10-CM | POA: Diagnosis not present

## 2018-12-11 DIAGNOSIS — R41841 Cognitive communication deficit: Secondary | ICD-10-CM | POA: Diagnosis not present

## 2018-12-11 DIAGNOSIS — Z7984 Long term (current) use of oral hypoglycemic drugs: Secondary | ICD-10-CM | POA: Diagnosis not present

## 2018-12-11 DIAGNOSIS — G40909 Epilepsy, unspecified, not intractable, without status epilepticus: Secondary | ICD-10-CM | POA: Diagnosis not present

## 2018-12-11 DIAGNOSIS — E119 Type 2 diabetes mellitus without complications: Secondary | ICD-10-CM | POA: Diagnosis not present

## 2018-12-12 ENCOUNTER — Telehealth: Payer: Self-pay | Admitting: *Deleted

## 2018-12-12 NOTE — Telephone Encounter (Signed)
Skilled nurse orders signed by Dr. Lucia Gaskins and faxed back to [facility]. Received a receipt of confirmation.

## 2018-12-13 DIAGNOSIS — R2681 Unsteadiness on feet: Secondary | ICD-10-CM | POA: Diagnosis not present

## 2018-12-13 DIAGNOSIS — G40909 Epilepsy, unspecified, not intractable, without status epilepticus: Secondary | ICD-10-CM | POA: Diagnosis not present

## 2018-12-13 DIAGNOSIS — Z743 Need for continuous supervision: Secondary | ICD-10-CM | POA: Diagnosis not present

## 2018-12-13 DIAGNOSIS — E119 Type 2 diabetes mellitus without complications: Secondary | ICD-10-CM | POA: Diagnosis not present

## 2018-12-13 DIAGNOSIS — F0280 Dementia in other diseases classified elsewhere without behavioral disturbance: Secondary | ICD-10-CM | POA: Diagnosis not present

## 2018-12-13 DIAGNOSIS — Z7984 Long term (current) use of oral hypoglycemic drugs: Secondary | ICD-10-CM | POA: Diagnosis not present

## 2018-12-13 DIAGNOSIS — G301 Alzheimer's disease with late onset: Secondary | ICD-10-CM | POA: Diagnosis not present

## 2018-12-13 DIAGNOSIS — R41841 Cognitive communication deficit: Secondary | ICD-10-CM | POA: Diagnosis not present

## 2018-12-14 DIAGNOSIS — Z743 Need for continuous supervision: Secondary | ICD-10-CM | POA: Diagnosis not present

## 2018-12-14 DIAGNOSIS — G301 Alzheimer's disease with late onset: Secondary | ICD-10-CM | POA: Diagnosis not present

## 2018-12-14 DIAGNOSIS — F0280 Dementia in other diseases classified elsewhere without behavioral disturbance: Secondary | ICD-10-CM | POA: Diagnosis not present

## 2018-12-14 DIAGNOSIS — R2681 Unsteadiness on feet: Secondary | ICD-10-CM | POA: Diagnosis not present

## 2018-12-14 DIAGNOSIS — Z7984 Long term (current) use of oral hypoglycemic drugs: Secondary | ICD-10-CM | POA: Diagnosis not present

## 2018-12-14 DIAGNOSIS — G40909 Epilepsy, unspecified, not intractable, without status epilepticus: Secondary | ICD-10-CM | POA: Diagnosis not present

## 2018-12-14 DIAGNOSIS — R41841 Cognitive communication deficit: Secondary | ICD-10-CM | POA: Diagnosis not present

## 2018-12-14 DIAGNOSIS — E119 Type 2 diabetes mellitus without complications: Secondary | ICD-10-CM | POA: Diagnosis not present

## 2018-12-18 DIAGNOSIS — G40909 Epilepsy, unspecified, not intractable, without status epilepticus: Secondary | ICD-10-CM | POA: Diagnosis not present

## 2018-12-18 DIAGNOSIS — E119 Type 2 diabetes mellitus without complications: Secondary | ICD-10-CM | POA: Diagnosis not present

## 2018-12-18 DIAGNOSIS — Z7984 Long term (current) use of oral hypoglycemic drugs: Secondary | ICD-10-CM | POA: Diagnosis not present

## 2018-12-18 DIAGNOSIS — G301 Alzheimer's disease with late onset: Secondary | ICD-10-CM | POA: Diagnosis not present

## 2018-12-18 DIAGNOSIS — F0280 Dementia in other diseases classified elsewhere without behavioral disturbance: Secondary | ICD-10-CM | POA: Diagnosis not present

## 2018-12-18 DIAGNOSIS — R2681 Unsteadiness on feet: Secondary | ICD-10-CM | POA: Diagnosis not present

## 2018-12-18 DIAGNOSIS — Z743 Need for continuous supervision: Secondary | ICD-10-CM | POA: Diagnosis not present

## 2018-12-18 DIAGNOSIS — R41841 Cognitive communication deficit: Secondary | ICD-10-CM | POA: Diagnosis not present

## 2018-12-19 ENCOUNTER — Other Ambulatory Visit: Payer: Self-pay | Admitting: Family Medicine

## 2018-12-20 DIAGNOSIS — F0280 Dementia in other diseases classified elsewhere without behavioral disturbance: Secondary | ICD-10-CM | POA: Diagnosis not present

## 2018-12-20 DIAGNOSIS — E119 Type 2 diabetes mellitus without complications: Secondary | ICD-10-CM | POA: Diagnosis not present

## 2018-12-20 DIAGNOSIS — Z743 Need for continuous supervision: Secondary | ICD-10-CM | POA: Diagnosis not present

## 2018-12-20 DIAGNOSIS — Z7984 Long term (current) use of oral hypoglycemic drugs: Secondary | ICD-10-CM | POA: Diagnosis not present

## 2018-12-20 DIAGNOSIS — R41841 Cognitive communication deficit: Secondary | ICD-10-CM | POA: Diagnosis not present

## 2018-12-20 DIAGNOSIS — R2681 Unsteadiness on feet: Secondary | ICD-10-CM | POA: Diagnosis not present

## 2018-12-20 DIAGNOSIS — G40909 Epilepsy, unspecified, not intractable, without status epilepticus: Secondary | ICD-10-CM | POA: Diagnosis not present

## 2018-12-20 DIAGNOSIS — G301 Alzheimer's disease with late onset: Secondary | ICD-10-CM | POA: Diagnosis not present

## 2018-12-25 DIAGNOSIS — F0280 Dementia in other diseases classified elsewhere without behavioral disturbance: Secondary | ICD-10-CM | POA: Diagnosis not present

## 2018-12-25 DIAGNOSIS — E119 Type 2 diabetes mellitus without complications: Secondary | ICD-10-CM | POA: Diagnosis not present

## 2018-12-25 DIAGNOSIS — R41841 Cognitive communication deficit: Secondary | ICD-10-CM | POA: Diagnosis not present

## 2018-12-25 DIAGNOSIS — G40909 Epilepsy, unspecified, not intractable, without status epilepticus: Secondary | ICD-10-CM | POA: Diagnosis not present

## 2018-12-25 DIAGNOSIS — Z7984 Long term (current) use of oral hypoglycemic drugs: Secondary | ICD-10-CM | POA: Diagnosis not present

## 2018-12-25 DIAGNOSIS — Z743 Need for continuous supervision: Secondary | ICD-10-CM | POA: Diagnosis not present

## 2018-12-25 DIAGNOSIS — R2681 Unsteadiness on feet: Secondary | ICD-10-CM | POA: Diagnosis not present

## 2018-12-25 DIAGNOSIS — G301 Alzheimer's disease with late onset: Secondary | ICD-10-CM | POA: Diagnosis not present

## 2018-12-28 DIAGNOSIS — G40909 Epilepsy, unspecified, not intractable, without status epilepticus: Secondary | ICD-10-CM | POA: Diagnosis not present

## 2018-12-28 DIAGNOSIS — F0280 Dementia in other diseases classified elsewhere without behavioral disturbance: Secondary | ICD-10-CM | POA: Diagnosis not present

## 2018-12-28 DIAGNOSIS — R41841 Cognitive communication deficit: Secondary | ICD-10-CM | POA: Diagnosis not present

## 2018-12-28 DIAGNOSIS — Z743 Need for continuous supervision: Secondary | ICD-10-CM | POA: Diagnosis not present

## 2018-12-28 DIAGNOSIS — R2681 Unsteadiness on feet: Secondary | ICD-10-CM | POA: Diagnosis not present

## 2018-12-28 DIAGNOSIS — Z7984 Long term (current) use of oral hypoglycemic drugs: Secondary | ICD-10-CM | POA: Diagnosis not present

## 2018-12-28 DIAGNOSIS — E119 Type 2 diabetes mellitus without complications: Secondary | ICD-10-CM | POA: Diagnosis not present

## 2018-12-28 DIAGNOSIS — G301 Alzheimer's disease with late onset: Secondary | ICD-10-CM | POA: Diagnosis not present

## 2018-12-29 ENCOUNTER — Other Ambulatory Visit: Payer: Self-pay | Admitting: Family Medicine

## 2019-01-01 DIAGNOSIS — F0280 Dementia in other diseases classified elsewhere without behavioral disturbance: Secondary | ICD-10-CM | POA: Diagnosis not present

## 2019-01-01 DIAGNOSIS — G301 Alzheimer's disease with late onset: Secondary | ICD-10-CM | POA: Diagnosis not present

## 2019-01-01 DIAGNOSIS — Z7984 Long term (current) use of oral hypoglycemic drugs: Secondary | ICD-10-CM | POA: Diagnosis not present

## 2019-01-01 DIAGNOSIS — R2681 Unsteadiness on feet: Secondary | ICD-10-CM | POA: Diagnosis not present

## 2019-01-01 DIAGNOSIS — R41841 Cognitive communication deficit: Secondary | ICD-10-CM | POA: Diagnosis not present

## 2019-01-01 DIAGNOSIS — Z743 Need for continuous supervision: Secondary | ICD-10-CM | POA: Diagnosis not present

## 2019-01-01 DIAGNOSIS — G40909 Epilepsy, unspecified, not intractable, without status epilepticus: Secondary | ICD-10-CM | POA: Diagnosis not present

## 2019-01-01 DIAGNOSIS — E119 Type 2 diabetes mellitus without complications: Secondary | ICD-10-CM | POA: Diagnosis not present

## 2019-01-07 ENCOUNTER — Ambulatory Visit (INDEPENDENT_AMBULATORY_CARE_PROVIDER_SITE_OTHER): Payer: Medicare HMO

## 2019-01-07 ENCOUNTER — Telehealth: Payer: Self-pay

## 2019-01-07 DIAGNOSIS — E1142 Type 2 diabetes mellitus with diabetic polyneuropathy: Secondary | ICD-10-CM | POA: Diagnosis not present

## 2019-01-07 DIAGNOSIS — Z0000 Encounter for general adult medical examination without abnormal findings: Secondary | ICD-10-CM | POA: Diagnosis not present

## 2019-01-07 MED ORDER — TOVIAZ 8 MG PO TB24: ORAL_TABLET | ORAL | 1 refills | Status: DC

## 2019-01-07 NOTE — Progress Notes (Signed)
Subjective:   Leah Payne is a 73 y.o. female who presents for Medicare Annual (Subsequent) preventive examination.  Review of Systems:  N/A Cardiac Risk Factors include: advanced age (>5955men, 27>65 women);diabetes mellitus     Objective:     Vitals: There were no vitals taken for this visit.  There is no height or weight on file to calculate BMI.  Advanced Directives 01/07/2019 02/22/2018 09/29/2017 08/15/2016 08/29/2014 08/04/2013  Does Patient Have a Medical Advance Directive? No No No No No Patient does not have advance directive  Would patient like information on creating a medical advance directive? No - Patient declined - Yes (MAU/Ambulatory/Procedural Areas - Information given) No - Patient declined - -  Pre-existing out of facility DNR order (yellow form or pink MOST form) - - - - - No    Tobacco Social History   Tobacco Use  Smoking Status Never Smoker  Smokeless Tobacco Never Used     Counseling given: Not Answered   Clinical Intake:  Pre-visit preparation completed: Yes  Pain : No/denies pain Pain Score: 0-No pain     Nutritional Status: BMI of 19-24  Normal Nutritional Risks: None Diabetes: Yes CBG done?: No Did pt. bring in CBG monitor from home?: No  How often do you need to have someone help you when you read instructions, pamphlets, or other written materials from your doctor or pharmacy?: 1 - Never What is the last grade level you completed in school?: 12th grade  Interpreter Needed?: No  Comments: pt lives with spouse Information entered by :: LPinson, LPN  Past Medical History:  Diagnosis Date  . Anomaly of cranium    frontal bossing, enlarged cranium, adult strabismus  . Diabetes mellitus type 2, controlled (HCC) 08/04/2013  . Heart disease   . History of chicken pox   . History of fainting   . 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 Hyperlipidemia 08/04/2013  . Hyponatremia 08/29/2014  . Preaxial polydactyly of right hand congenital  . Seizure disorder (HCC) 08/04/2013  . Tachycardia   . WPW (Wolff-Parkinson-White syndrome)    has seen Dr Donnie Ahoilley   Past Surgical History:  Procedure Laterality Date  . CHOLECYSTECTOMY  1978   Family History  Problem Relation Age of Onset  . Stroke Mother [id number]  . Pneumonia Mother   . CAD Father [id number]  . Alzheimer's disease Father   . Pneumonia Father   . Cancer Maternal Aunt        lung (non-smoker)  . Diabetes Neg Hx    Social History   Socioeconomic History  . Marital status: Married    Spouse name: Not on file  . Number of children: Not on file  . Years of education: Not on file  . Highest education level: High school graduate  Occupational History  . Not on file  Social Needs  . Financial resource strain: Not on file  . Food insecurity    Worry: Not on file    Inability: Not on file  . Transportation needs    Medical: Not on file    Non-medical: Not on file  Tobacco Use  . Smoking status: Never Smoker  . Smokeless tobacco: Never Used  Substance and Sexual Activity  . Alcohol use: No    Alcohol/week: 0.0 standard drinks  . Drug use: No  .  Sexual activity: Not on file  Lifestyle  . Physical activity    Days per week: Not on file    Minutes per session: Not on file  . Stress: Not on file  Relationships  . Social Herbalist on phone: Not on file    Gets together: Not on file    Attends religious service: Not on file    Active member of club or organization: Not on file    Attends meetings of clubs or organizations: Not on file    Relationship status: Not on file  Other Topics Concern  . Not on file  Social History Narrative   Lives with husband, 2 dogs   Moved from [facility]   Occupation: retired, was housewife   Edu: completed HS    Activity: no regular exercise. Dizziness  precludes activity   Diet: good water, fruits/vegetables daily   Caffeine: 2 cups daily    Outpatient Encounter Medications as of 01/07/2019  Medication Sig  . acetaminophen (TYLENOL) 500 MG tablet Take 1 tablet (500 mg total) by mouth every 8 (eight) hours as needed.  Marland Kitchen atorvastatin (LIPITOR) 40 MG tablet TAKE 1 TABLET EVERY DAY  . donepezil (ARICEPT) 10 MG tablet TAKE 1 TABLET EVERY DAY  . fesoterodine ([name]) 8 MG TB24 tablet TAKE 1 TABLET (8 MG TOTAL) BY MOUTH DAILY.  Marland Kitchen levETIRAcetam (KEPPRA) 500 MG tablet TAKE 1 TABLET TWICE DAILY  . levothyroxine (SYNTHROID) 25 MCG tablet TAKE 1 TABLET DAILY BEFORE BREAKFAST.  Marland Kitchen meclizine (ANTIVERT) 25 MG tablet TAKE 1/2 TO 1 TABLET TWICE DAILY AS NEEDED FOR DIZZINESS  . metFORMIN (GLUCOPHAGE) 500 MG tablet TAKE 1 TABLET EVERY DAY WITH BREAKFAST  . mirabegron ER ([name]) 50 MG TB24 tablet Take 1 tablet (50 mg total) by mouth at bedtime.  . propranolol (INDERAL) 10 MG tablet TAKE 1 TABLET TWICE DAILY  . sertraline (ZOLOFT) 25 MG tablet TAKE 3 TABLETS EVERY DAY  . [DISCONTINUED] Cholecalciferol (VITAMIN D) 2000 units CAPS Take 1 capsule by mouth daily.   No facility-administered encounter medications on file as of 01/07/2019.     Activities of Daily Living In your present state of health, do you have any difficulty performing the following activities: 01/07/2019  Hearing? N  Vision? N  Difficulty concentrating or making decisions? Y  Walking or climbing stairs? N  Dressing or bathing? N  Doing errands, shopping? Y  Preparing Food and eating ? N  Using the Toilet? N  In the past six months, have you accidently leaked urine? N  Do you have problems with loss of bowel control? N  Managing your Medications? N  Managing your Finances? N  Housekeeping or managing your Housekeeping? N  Some recent data might be hidden    Patient Care Team: [name], MD as PCP - General (Family Medicine)    Assessment:   This is a routine wellness  examination for [facility].   Hearing Screening   125Hz  250Hz  500Hz  1000Hz  2000Hz  3000Hz  4000Hz  6000Hz  8000Hz   Right ear:           Left ear:           Vision Screening Comments: Last vision exam approx. 3 yrs ago   Exercise Activities and Dietary recommendations Current Exercise Habits: The patient does not participate in regular exercise at present, Exercise limited by: None identified  Goals    . Patient Stated     Starting 01/07/2019, I will continue to take medications as prescribed.  Fall Risk Fall Risk  01/07/2019 03/27/2018 09/29/2017 09/23/2016 12/18/2015  Falls in the past year? 0 Yes Yes Yes No  Comment - - pt fell in bathroom after losing balance; denies injury - -  Number falls in past yr: - 2 or more 1 1 -  Injury with Fall? - Yes No No -  Risk Factor Category  - High Fall Risk - - -  Risk for fall due to : - - Impaired balance/gait;Impaired mobility;History of fall(s);Impaired vision;Mental status change Impaired balance/gait;Impaired mobility Impaired balance/gait    Depression Screen PHQ 2/9 Scores 01/07/2019 09/29/2017 09/23/2016  PHQ - 2 Score 0 0 0  PHQ- 9 Score 0 0 -     Cognitive Function MMSE - Mini Mental State Exam 01/07/2019 03/27/2018 09/29/2017 09/19/2017  Not completed: - - (No Data) -  Orientation to time 5 3 - 5  Orientation to Place 5 4 - 4  Registration 3 3 - 3  Attention/ Calculation 0 5 - 4  Recall 2 3 - 1  Recall-comments unable to recall 1 of 3 words - - -  Language- name 2 objects 0 2 - 2  Language- repeat 1 1 - 1  Language- follow 3 step command 0 2 - 2  Language- read & follow direction 0 1 - 1  Write a sentence 0 1 - 1  Copy design 0 0 - 0  Total score 16 25 - 24     PLEASE NOTE: A Mini-Cog screen was completed. Maximum score is 17. A value of 0 denotes this part of Folstein MMSE was not completed or the patient failed this part of the Mini-Cog screening.   Mini-Cog Screening Orientation to Time - Max 5 pts Orientation to Place - Max 5  pts Registration - Max 3 pts Recall - Max 3 pts Language Repeat - Max 1 pts      Immunization History  Administered Date(s) Administered  . Influenza, High Dose Seasonal PF 03/31/2017  . Influenza,inj,Quad PF,6+ Mos 03/23/2016, 04/05/2018  . Influenza-Unspecified 03/26/2015  . Pneumococcal Conjugate-13 08/25/2016  . Pneumococcal Polysaccharide-23 03/16/2015      Screening Tests Health Maintenance  Topic Date Due  . HEMOGLOBIN A1C  01/22/2019 (Originally 12/10/2018)  . URINE MICROALBUMIN  01/22/2019 (Originally 10/07/2018)  . DTaP/Tdap/Td (1 - Tdap) 07/24/2020 (Originally 04/12/1965)  . MAMMOGRAM  07/24/2020 (Originally 02/01/2014)  . OPHTHALMOLOGY EXAM  07/24/2020 (Originally 09/26/2017)  . DEXA SCAN  07/24/2020 (Originally 04/12/2010)  . COLONOSCOPY  07/24/2020 (Originally 04/13/1995)  . TETANUS/TDAP  07/24/2020 (Originally 04/12/1964)  . INFLUENZA VACCINE  02/23/2019  . FOOT EXAM  06/12/2019  . Hepatitis C Screening  Completed  . PNA vac Low Risk Adult  Completed      Plan:     I have personally reviewed, addressed, and noted the following in the patient's chart:  A. Medical and social history B. Use of alcohol, tobacco or illicit drugs  C. Current medications and supplements D. Functional ability and status E.  Nutritional status F.  Physical activity G. Advance directives H. List of other physicians I.  Hospitalizations, surgeries, and ER visits in previous 12 months J.  Vitals (unless it is a telemedicine encounter) K. Screenings to include cognitive, depression, hearing, vision (NOTE: hearing and vision screenings not completed in telemedicine encounter) L. Referrals and appointments   In addition, I have reviewed and discussed with patient certain preventive protocols, quality metrics, and best practice recommendations. A written personalized care plan for preventive services and recommendations were  provided to patient.  With patient's permission, we connected on  01/07/19 at  8:00 AM EDT. Interactive audio and video telecommunications were attempted with patient. This attempt was unsuccessful due to patient having technical difficulties OR patient did not have access to video capability.  Encounter was completed with audio only.  Two patient identifiers were used to ensure the encounter occurred with the correct person. Patient was in home and writer was in office.   Signed,   Randa EvensLesia Vidya Bamford, MHA, BS, RN Health Coach

## 2019-01-07 NOTE — Patient Instructions (Signed)
Leah Payne , Thank you for taking time to come for your Medicare Wellness Visit. I appreciate your ongoing commitment to your health goals. Please review the following plan we discussed and let me know if I can assist you in the future.   These are the goals we discussed: Goals    . Patient Stated     Starting 01/07/2019, I will continue to take medications as prescribed.        This is a list of the screening recommended for you and due dates:  Health Maintenance  Topic Date Due  . Hemoglobin A1C  01/22/2019*  . Urine Protein Check  01/22/2019*  . DTaP/Tdap/Td vaccine (1 - Tdap) 07/24/2020*  . Mammogram  07/24/2020*  . Eye exam for diabetics  07/24/2020*  . DEXA scan (bone density measurement)  07/24/2020*  . Colon Cancer Screening  07/24/2020*  . Tetanus Vaccine  07/24/2020*  . Flu Shot  02/23/2019  . Complete foot exam   06/12/2019  .  Hepatitis C: One time screening is recommended by Center for Disease Control  (CDC) for  adults born from [birth year] through 1966.   Completed  . Pneumonia vaccines  Completed  *Topic was postponed. The date shown is not the original due date.   Preventive Care for Adults  A healthy lifestyle and preventive care can promote health and wellness. Preventive health guidelines for adults include the following key practices.  . A routine yearly physical is a good way to check with your health care provider about your health and preventive screening. It is a chance to share any concerns and updates on your health and to receive a thorough exam.  . Visit your dentist for a routine exam and preventive care every 6 months. Brush your teeth twice a day and floss once a day. Good oral hygiene prevents tooth decay and gum disease.  . The frequency of eye exams is based on your age, health, family medical history, use  of contact lenses, and other factors. Follow your health care provider's recommendations for frequency of eye exams.  . Eat a healthy diet. Foods  like vegetables, fruits, whole grains, low-fat dairy products, and lean protein foods contain the nutrients you need without too many calories. Decrease your intake of foods high in solid fats, added sugars, and salt. Eat the right amount of calories for you. Get information about a proper diet from your health care provider, if necessary.  . Regular physical exercise is one of the most important things you can do for your health. Most adults should get at least 150 minutes of moderate-intensity exercise (any activity that increases your heart rate and causes you to sweat) each week. In addition, most adults need muscle-strengthening exercises on 2 or more days a week.  Silver Sneakers may be a benefit available to you. To determine eligibility, you may visit the website: www.silversneakers.com or contact program at (681)350-0081 Mon-Fri between 8AM-8PM.   . Maintain a healthy weight. The body mass index (BMI) is a screening tool to identify possible weight problems. It provides an estimate of body fat based on height and weight. Your health care provider can find your BMI and can help you achieve or maintain a healthy weight.   For adults 20 years and older: ? A BMI below 18.5 is considered underweight. ? A BMI of 18.5 to 24.9 is normal. ? A BMI of 25 to 29.9 is considered overweight. ? A BMI of 30 and above is considered obese.   Marland Kitchen  Maintain normal blood lipids and cholesterol levels by exercising and minimizing your intake of saturated fat. Eat a balanced diet with plenty of fruit and vegetables. Blood tests for lipids and cholesterol should begin at age 53 and be repeated every 5 years. If your lipid or cholesterol levels are high, you are over 50, or you are at high risk for heart disease, you may need your cholesterol levels checked more frequently. Ongoing high lipid and cholesterol levels should be treated with medicines if diet and exercise are not working.  . If you smoke, find out from  your health care provider how to quit. If you do not use tobacco, please do not start.  . If you choose to drink alcohol, please do not consume more than 2 drinks per day. One drink is considered to be 12 ounces (355 mL) of beer, 5 ounces (148 mL) of wine, or 1.5 ounces (44 mL) of liquor.  . If you are 12-52 years old, ask your health care provider if you should take aspirin to prevent strokes.  . Use sunscreen. Apply sunscreen liberally and repeatedly throughout the day. You should seek shade when your shadow is shorter than you. Protect yourself by wearing long sleeves, pants, a wide-brimmed hat, and sunglasses year round, whenever you are outdoors.  . Once a month, do a whole body skin exam, using a mirror to look at the skin on your back. Tell your health care provider of new moles, moles that have irregular borders, moles that are larger than a pencil eraser, or moles that have changed in shape or color.

## 2019-01-07 NOTE — Progress Notes (Signed)
PCP notes:   Health maintenance:  A1C - to be determined by PCP Microalbumin - future order generated and sent to PCP Eye exam - addressed Bone density - to be determined by PCP Colon cancer screening - to be determined by PCP Tetanus vaccine - postponed/insurance  Abnormal screenings:   Mini-Cog score: 16/17 MMSE - Mini Mental State Exam 01/07/2019 03/27/2018 09/29/2017 09/19/2017  Not completed: - - (No Data) -  Orientation to time 5 3 - 5  Orientation to Place 5 4 - 4  Registration 3 3 - 3  Attention/ Calculation 0 5 - 4  Recall 2 3 - 1  Recall-comments unable to recall 1 of 3 words - - -  Language- name 2 objects 0 2 - 2  Language- repeat 1 1 - 1  Language- follow 3 step command 0 2 - 2  Language- read & follow direction 0 1 - 1  Write a sentence 0 1 - 1  Copy design 0 0 - 0  Total score 16 25 - 24     Patient concerns:   Request for refill for [name]. Request sent to PCP.   Nurse concerns:  None  Next PCP appt:   01/31/19 @ 0830

## 2019-01-07 NOTE — Telephone Encounter (Signed)
During AWV, patient's spouse requested refill for Leah Payne be sent to [facility].

## 2019-01-07 NOTE — Telephone Encounter (Signed)
Refilled. Thanks!

## 2019-01-09 DIAGNOSIS — G301 Alzheimer's disease with late onset: Secondary | ICD-10-CM | POA: Diagnosis not present

## 2019-01-09 DIAGNOSIS — G40909 Epilepsy, unspecified, not intractable, without status epilepticus: Secondary | ICD-10-CM | POA: Diagnosis not present

## 2019-01-09 DIAGNOSIS — E119 Type 2 diabetes mellitus without complications: Secondary | ICD-10-CM | POA: Diagnosis not present

## 2019-01-09 DIAGNOSIS — Z743 Need for continuous supervision: Secondary | ICD-10-CM | POA: Diagnosis not present

## 2019-01-09 DIAGNOSIS — F0280 Dementia in other diseases classified elsewhere without behavioral disturbance: Secondary | ICD-10-CM | POA: Diagnosis not present

## 2019-01-09 DIAGNOSIS — R41841 Cognitive communication deficit: Secondary | ICD-10-CM | POA: Diagnosis not present

## 2019-01-09 DIAGNOSIS — Z7984 Long term (current) use of oral hypoglycemic drugs: Secondary | ICD-10-CM | POA: Diagnosis not present

## 2019-01-09 DIAGNOSIS — R2681 Unsteadiness on feet: Secondary | ICD-10-CM | POA: Diagnosis not present

## 2019-01-10 ENCOUNTER — Other Ambulatory Visit: Payer: Self-pay | Admitting: Neurology

## 2019-01-11 ENCOUNTER — Other Ambulatory Visit: Payer: Self-pay | Admitting: Family Medicine

## 2019-01-14 DIAGNOSIS — Z743 Need for continuous supervision: Secondary | ICD-10-CM | POA: Diagnosis not present

## 2019-01-14 DIAGNOSIS — G301 Alzheimer's disease with late onset: Secondary | ICD-10-CM | POA: Diagnosis not present

## 2019-01-14 DIAGNOSIS — G40909 Epilepsy, unspecified, not intractable, without status epilepticus: Secondary | ICD-10-CM | POA: Diagnosis not present

## 2019-01-14 DIAGNOSIS — Z7984 Long term (current) use of oral hypoglycemic drugs: Secondary | ICD-10-CM | POA: Diagnosis not present

## 2019-01-14 DIAGNOSIS — E119 Type 2 diabetes mellitus without complications: Secondary | ICD-10-CM | POA: Diagnosis not present

## 2019-01-14 DIAGNOSIS — R41841 Cognitive communication deficit: Secondary | ICD-10-CM | POA: Diagnosis not present

## 2019-01-14 DIAGNOSIS — F0280 Dementia in other diseases classified elsewhere without behavioral disturbance: Secondary | ICD-10-CM | POA: Diagnosis not present

## 2019-01-14 DIAGNOSIS — R2681 Unsteadiness on feet: Secondary | ICD-10-CM | POA: Diagnosis not present

## 2019-01-18 ENCOUNTER — Telehealth: Payer: Self-pay

## 2019-01-18 NOTE — Telephone Encounter (Signed)
Left detailed VM w COVID screen and back door lab info   

## 2019-01-21 NOTE — Progress Notes (Signed)
I reviewed health advisor's note, was available for consultation, and agree with documentation and plan.  

## 2019-01-22 ENCOUNTER — Other Ambulatory Visit: Payer: Medicare HMO

## 2019-01-22 ENCOUNTER — Other Ambulatory Visit: Payer: Self-pay | Admitting: Family Medicine

## 2019-01-22 ENCOUNTER — Other Ambulatory Visit: Payer: Self-pay

## 2019-01-22 DIAGNOSIS — E039 Hypothyroidism, unspecified: Secondary | ICD-10-CM

## 2019-01-22 DIAGNOSIS — E785 Hyperlipidemia, unspecified: Secondary | ICD-10-CM

## 2019-01-22 DIAGNOSIS — G40909 Epilepsy, unspecified, not intractable, without status epilepticus: Secondary | ICD-10-CM

## 2019-01-22 DIAGNOSIS — E559 Vitamin D deficiency, unspecified: Secondary | ICD-10-CM

## 2019-01-22 DIAGNOSIS — E1142 Type 2 diabetes mellitus with diabetic polyneuropathy: Secondary | ICD-10-CM

## 2019-01-24 ENCOUNTER — Other Ambulatory Visit: Payer: Medicare HMO

## 2019-01-31 ENCOUNTER — Other Ambulatory Visit: Payer: Self-pay

## 2019-01-31 ENCOUNTER — Ambulatory Visit (INDEPENDENT_AMBULATORY_CARE_PROVIDER_SITE_OTHER): Payer: Medicare HMO | Admitting: Family Medicine

## 2019-01-31 ENCOUNTER — Encounter: Payer: Self-pay | Admitting: Family Medicine

## 2019-01-31 VITALS — BP 130/84 | HR 62 | Temp 97.8°F | Ht 59.75 in | Wt 143.3 lb

## 2019-01-31 DIAGNOSIS — N3281 Overactive bladder: Secondary | ICD-10-CM | POA: Diagnosis not present

## 2019-01-31 DIAGNOSIS — Q039 Congenital hydrocephalus, unspecified: Secondary | ICD-10-CM

## 2019-01-31 DIAGNOSIS — E1142 Type 2 diabetes mellitus with diabetic polyneuropathy: Secondary | ICD-10-CM

## 2019-01-31 DIAGNOSIS — Q691 Accessory thumb(s): Secondary | ICD-10-CM

## 2019-01-31 DIAGNOSIS — F0280 Dementia in other diseases classified elsewhere without behavioral disturbance: Secondary | ICD-10-CM | POA: Diagnosis not present

## 2019-01-31 DIAGNOSIS — Z0000 Encounter for general adult medical examination without abnormal findings: Secondary | ICD-10-CM | POA: Diagnosis not present

## 2019-01-31 DIAGNOSIS — F0281 Dementia in other diseases classified elsewhere with behavioral disturbance: Secondary | ICD-10-CM

## 2019-01-31 DIAGNOSIS — E559 Vitamin D deficiency, unspecified: Secondary | ICD-10-CM

## 2019-01-31 DIAGNOSIS — E2839 Other primary ovarian failure: Secondary | ICD-10-CM

## 2019-01-31 DIAGNOSIS — E1140 Type 2 diabetes mellitus with diabetic neuropathy, unspecified: Secondary | ICD-10-CM | POA: Diagnosis not present

## 2019-01-31 DIAGNOSIS — I456 Pre-excitation syndrome: Secondary | ICD-10-CM

## 2019-01-31 DIAGNOSIS — E039 Hypothyroidism, unspecified: Secondary | ICD-10-CM | POA: Diagnosis not present

## 2019-01-31 DIAGNOSIS — G40909 Epilepsy, unspecified, not intractable, without status epilepticus: Secondary | ICD-10-CM | POA: Diagnosis not present

## 2019-01-31 DIAGNOSIS — Z7189 Other specified counseling: Secondary | ICD-10-CM

## 2019-01-31 DIAGNOSIS — G301 Alzheimer's disease with late onset: Secondary | ICD-10-CM

## 2019-01-31 DIAGNOSIS — G309 Alzheimer's disease, unspecified: Secondary | ICD-10-CM | POA: Diagnosis not present

## 2019-01-31 DIAGNOSIS — E785 Hyperlipidemia, unspecified: Secondary | ICD-10-CM

## 2019-01-31 DIAGNOSIS — Z1239 Encounter for other screening for malignant neoplasm of breast: Secondary | ICD-10-CM

## 2019-01-31 DIAGNOSIS — F02818 Dementia in other diseases classified elsewhere, unspecified severity, with other behavioral disturbance: Secondary | ICD-10-CM

## 2019-01-31 LAB — LIPID PANEL
Cholesterol: 127 mg/dL (ref 0–200)
HDL: 46.8 mg/dL (ref >39.00)
LDL Cholesterol: 58 mg/dL (ref 0–99)
NonHDL: 80.49
Total CHOL/HDL Ratio: 3
Triglycerides: 113 mg/dL (ref 0.0–149.0)
VLDL: 22.6 mg/dL (ref 0.0–40.0)

## 2019-01-31 LAB — COMPREHENSIVE METABOLIC PANEL
ALT: 17 U/L (ref 0–35)
AST: 16 U/L (ref 0–37)
Albumin: 4.4 g/dL (ref 3.5–5.2)
Alkaline Phosphatase: 71 U/L (ref 39–117)
BUN: 7 mg/dL (ref 6–23)
CO2: 30 mEq/L (ref 19–32)
Calcium: 9.4 mg/dL (ref 8.4–10.5)
Chloride: 101 mEq/L (ref 96–112)
Creatinine, Ser: 0.68 mg/dL (ref 0.40–1.20)
GFR: 84.63 mL/min (ref >60.00)
Glucose, Bld: 113 mg/dL — ABNORMAL HIGH (ref 70–99)
Potassium: 4.6 mEq/L (ref 3.5–5.1)
Sodium: 137 mEq/L (ref 135–145)
Total Bilirubin: 0.4 mg/dL (ref 0.2–1.2)
Total Protein: 7.4 g/dL (ref 6.0–8.3)

## 2019-01-31 LAB — TSH: TSH: 1.95 u[IU]/mL (ref 0.35–4.50)

## 2019-01-31 LAB — VITAMIN D 25 HYDROXY (VIT D DEFICIENCY, FRACTURES): VITD: 20.8 ng/mL — ABNORMAL LOW (ref 30.00–100.00)

## 2019-01-31 LAB — HEMOGLOBIN A1C: Hgb A1c MFr Bld: 6.2 % (ref 4.6–6.5)

## 2019-01-31 LAB — T4, FREE: Free T4: 0.91 ng/dL (ref 0.60–1.60)

## 2019-01-31 MED ORDER — SERTRALINE HCL 100 MG PO TABS: 100.0000 mg | ORAL_TABLET | Freq: Every day | ORAL | 1 refills | Status: DC

## 2019-01-31 NOTE — Patient Instructions (Addendum)
We will sign you up for cologuard.  I have ordered mammogram as you're due for breast cancer screening.  If interested, check with pharmacy about new 2 shot shingles series (shingrix).  Schedule eye exam as you're due.   Health Maintenance After Age 74 After age 55, you are at a higher risk for certain long-term diseases and infections as well as injuries from falls. Falls are a major cause of broken bones and head injuries in people who are older than age 51. Getting regular preventive care can help to keep you healthy and well. Preventive care includes getting regular testing and making lifestyle changes as recommended by your health care provider. Talk with your health care provider about:  Which screenings and tests you should have. A screening is a test that checks for a disease when you have no symptoms.  A diet and exercise plan that is right for you. What should I know about screenings and tests to prevent falls? Screening and testing are the best ways to find a health problem early. Early diagnosis and treatment give you the best chance of managing medical conditions that are common after age 55. Certain conditions and lifestyle choices may make you more likely to have a fall. Your health care provider may recommend:  Regular vision checks. Poor vision and conditions such as cataracts can make you more likely to have a fall. If you wear glasses, make sure to get your prescription updated if your vision changes.  Medicine review. Work with your health care provider to regularly review all of the medicines you are taking, including over-the-counter medicines. Ask your health care provider about any side effects that may make you more likely to have a fall. Tell your health care provider if any medicines that you take make you feel dizzy or sleepy.  Osteoporosis screening. Osteoporosis is a condition that causes the bones to get weaker. This can make the bones weak and cause them to break  more easily.  Blood pressure screening. Blood pressure changes and medicines to control blood pressure can make you feel dizzy.  Strength and balance checks. Your health care provider may recommend certain tests to check your strength and balance while standing, walking, or changing positions.  Foot health exam. Foot pain and numbness, as well as not wearing proper footwear, can make you more likely to have a fall.  Depression screening. You may be more likely to have a fall if you have a fear of falling, feel emotionally low, or feel unable to do activities that you used to do.  Alcohol use screening. Using too much alcohol can affect your balance and may make you more likely to have a fall. What actions can I take to lower my risk of falls? General instructions  Talk with your health care provider about your risks for falling. Tell your health care provider if: ? You fall. Be sure to tell your health care provider about all falls, even ones that seem minor. ? You feel dizzy, sleepy, or off-balance.  Take over-the-counter and prescription medicines only as told by your health care provider. These include any supplements.  Eat a healthy diet and maintain a healthy weight. A healthy diet includes low-fat dairy products, low-fat (lean) meats, and fiber from whole grains, beans, and lots of fruits and vegetables. Home safety  Remove any tripping hazards, such as rugs, cords, and clutter.  Install safety equipment such as grab bars in bathrooms and safety rails on stairs.  Keep rooms  and walkways well-lit. Activity   Follow a regular exercise program to stay fit. This will help you maintain your balance. Ask your health care provider what types of exercise are appropriate for you.  If you need a cane or walker, use it as recommended by your health care provider.  Wear supportive shoes that have nonskid soles. Lifestyle  Do not drink alcohol if your health care provider tells you not  to drink.  If you drink alcohol, limit how much you have: ? 0-1 drink a day for women. ? 0-2 drinks a day for men.  Be aware of how much alcohol is in your drink. In the [place] one drink equals one typical bottle of beer (12 oz), one-half glass of wine (5 oz), or one shot of hard liquor (1 oz).  Do not use any products that contain nicotine or tobacco, such as cigarettes and e-cigarettes. If you need help quitting, ask your health care provider. Summary  Having a healthy lifestyle and getting preventive care can help to protect your health and wellness after age 74.  Screening and testing are the best way to find a health problem early and help you avoid having a fall. Early diagnosis and treatment give you the best chance for managing medical conditions that are more common for people who are older than age 74.  Falls are a major cause of broken bones and head injuries in people who are older than age 74. Take precautions to prevent a fall at home.  Work with your health care provider to learn what changes you can make to improve your health and wellness and to prevent falls. This information is not intended to replace advice given to you by your health care provider. Make sure you discuss any questions you have with your health care provider. Document Released: 05/24/2017 Document Revised: 11/01/2018 Document Reviewed: 05/24/2017 Elsevier Patient Education  2020 [employer]

## 2019-01-31 NOTE — Assessment & Plan Note (Signed)
Stable on [place].

## 2019-01-31 NOTE — Assessment & Plan Note (Signed)
Update labs.  

## 2019-01-31 NOTE — Assessment & Plan Note (Signed)
Stable period. Established with [facility] on keppra.

## 2019-01-31 NOTE — Progress Notes (Signed)
This visit was conducted in person.  BP 130/84 (BP Location: Right Arm, Patient Position: Sitting, Cuff Size: Normal)   Pulse 62   Temp 97.8 F (36.6 C) (Tympanic)   Ht 4' 11.75" (1.518 m)   Wt 143 lb 5 oz (65 kg)   SpO2 97%   BMI 28.22 kg/m    CC: CPE Subjective:    Patient ID: Leah Payne, female    DOB: 1945-11-12, 73 y.o.   MRN: [id number]  HPI: Leah Payne is a 73 y.o. female presenting on 01/31/2019 for Annual Exam (Pt 2. )    Hearing Screening   125Hz  250Hz  500Hz  1000Hz  2000Hz  3000Hz  4000Hz  6000Hz  8000Hz   Right ear:   25 25 25  25     Left ear:   25 20 25   40      Visual Acuity Screening   Right eye Left eye Both eyes  Without correction:     With correction: 20/50 20/40 20/50     Saw Lesia last month for medicare wellness visit. Note reviewed.  Dementia and h/o seizure disorder - followed by Dr Jaynee Eagles on aricept 10mg  daily, keppra 500mg  bid, also takes sertraline 25mg  TID. Increased struggle at home given disability and chronic medical conditions of pt and husband - looking into ALF. Social worker involved at home - working on finances.   Did have a fall 2 wks ago while husband was hospitalized. She was on floor for 6 hours.   Preventative: Colon cancer screening -discussed - never completed. Would like cologard.  Lung cancer screening -never smoker Breast cancer screening -last mammo 2013. Agrees to rpt mammo - ordered.  Well woman exam -none recently. Menopause around age 70yo. Declines exam today. Denies bleeding or pelvic pain.  DEXA scan - will refer again.  Flu shot -yearly prevnar 2018, penumovax 2016 Tetanus - declines shingrix - discussed Advanced directive discussion -has not set up. Packet provided today. Would want husband to be HCPOA then brother and BIL.  Seat belt use discussed Sunscreen usediscussed. No changing moles on skin.  Non smoker Alcohol - none Dentist - has not seen - no dental insurance Eye exam - due Bowel - no  constipation Bladder - incontinence stable on toviaz and myrbetriq with benefit. Occasional depends use.  Lives with husband, 2 dogs Moved from [facility] 2009 Occupation: retired, was housewife Edu: completed HS GED Activity: no regular exercise. Dizziness precludes activity Diet: good water, fruits/vegetables daily     Relevant past medical, surgical, family and social history reviewed and updated as indicated. Interim medical history since our last visit reviewed. Allergies and medications reviewed and updated. Outpatient Medications Prior to Visit  Medication Sig Dispense Refill  . acetaminophen (TYLENOL) 500 MG tablet Take 1 tablet (500 mg total) by mouth every 8 (eight) hours as needed.  0  . atorvastatin (LIPITOR) 40 MG tablet TAKE 1 TABLET EVERY DAY 90 tablet 0  . donepezil (ARICEPT) 10 MG tablet TAKE 1 TABLET EVERY DAY 90 tablet 4  . fesoterodine ([name]) 8 MG TB24 tablet TAKE 1 TABLET (8 MG TOTAL) BY MOUTH DAILY. 90 tablet 1  . levETIRAcetam (KEPPRA) 500 MG tablet TAKE 1 TABLET TWICE DAILY 180 tablet 4  . levothyroxine (SYNTHROID) 25 MCG tablet TAKE 1 TABLET DAILY BEFORE BREAKFAST. 90 tablet 1  . meclizine (ANTIVERT) 25 MG tablet TAKE 1/2 TO 1 TABLET TWICE DAILY AS NEEDED FOR DIZZINESS 60 tablet 0  . metFORMIN (GLUCOPHAGE) 500 MG tablet TAKE 1 TABLET EVERY DAY WITH BREAKFAST  90 tablet 0  . mirabegron ER ([name]) 50 MG TB24 tablet Take 1 tablet (50 mg total) by mouth at bedtime. 90 tablet 1  . propranolol (INDERAL) 10 MG tablet TAKE 1 TABLET TWICE DAILY 180 tablet 0  . sertraline (ZOLOFT) 25 MG tablet TAKE 3 TABLETS EVERY DAY 270 tablet 0   No facility-administered medications prior to visit.      Per HPI unless specifically indicated in ROS section below Review of Systems  Constitutional: Negative for activity change, appetite change, chills, fatigue, fever and unexpected weight change.  HENT: Negative for hearing loss.   Eyes: Negative for visual disturbance.  Respiratory:  Negative for cough, chest tightness, shortness of breath and wheezing.   Cardiovascular: Negative for chest pain, palpitations and leg swelling.  Gastrointestinal: Positive for diarrhea. Negative for abdominal distention, abdominal pain, blood in stool, constipation, nausea and vomiting.  Genitourinary: Negative for difficulty urinating and hematuria.  Musculoskeletal: Negative for arthralgias, myalgias and neck pain.  Skin: Negative for rash.  Neurological: Negative for dizziness, seizures, syncope and headaches.  Hematological: Negative for adenopathy. Does not bruise/bleed easily.  Psychiatric/Behavioral: Negative for dysphoric mood. The patient is not nervous/anxious.    Objective:    BP 130/84 (BP Location: Right Arm, Patient Position: Sitting, Cuff Size: Normal)   Pulse 62   Temp 97.8 F (36.6 C) (Tympanic)   Ht 4' 11.75" (1.518 m)   Wt 143 lb 5 oz (65 kg)   SpO2 97%   BMI 28.22 kg/m   Wt Readings from Last 3 Encounters:  01/31/19 143 lb 5 oz (65 kg)  06/11/18 138 lb 12 oz (62.9 kg)  03/27/18 127 lb (57.6 kg)    Physical Exam Vitals signs and nursing note reviewed.  Constitutional:      General: She is not in acute distress.    Appearance: She is well-developed.  HENT:     Head: Normocephalic and atraumatic.     Right Ear: Hearing, tympanic membrane, ear canal and external ear normal.     Left Ear: Hearing, tympanic membrane, ear canal and external ear normal.     Nose: Nose normal.     Mouth/Throat:     Mouth: Mucous membranes are moist.     Pharynx: Uvula midline. No oropharyngeal exudate or posterior oropharyngeal erythema.  Eyes:     General: No scleral icterus.    Extraocular Movements: Extraocular movements intact.     Conjunctiva/sclera: Conjunctivae normal.     Comments: R eye strabismus  Neck:     Musculoskeletal: Normal range of motion and neck supple.     Vascular: No carotid bruit.  Cardiovascular:     Rate and Rhythm: Normal rate and regular rhythm.      Pulses: Normal pulses.          Radial pulses are 2+ on the right side and 2+ on the left side.     Heart sounds: Normal heart sounds. No murmur.  Pulmonary:     Effort: Pulmonary effort is normal. No respiratory distress.     Breath sounds: Normal breath sounds. No wheezing, rhonchi or rales.  Abdominal:     General: Abdomen is flat. Bowel sounds are normal. There is no distension.     Palpations: Abdomen is soft. There is no mass.     Tenderness: There is no abdominal tenderness. There is no guarding or rebound.     Hernia: No hernia is present.  Musculoskeletal: Normal range of motion.     Comments: Polydactyly  at thumb R hand  Lymphadenopathy:     Cervical: No cervical adenopathy.  Skin:    General: Skin is warm and dry.     Findings: No rash.  Neurological:     General: No focal deficit present.     Mental Status: She is alert and oriented to person, place, and time.     Comments: CN grossly intact, station and gait intact  Psychiatric:        Mood and Affect: Mood normal.        Behavior: Behavior normal.        Thought Content: Thought content normal.        Judgment: Judgment normal.       Assessment & Plan:   Problem List Items Addressed This Visit    WPW (Wolff-Parkinson-White syndrome)    Continue propranolol.      Vitamin D deficiency   Type 2 diabetes, controlled, 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HCC)    Update labs.       Relevant Medications   sertraline (ZOLOFT) 100 MG tablet   Seizure disorder (HCC)    Stable period. Established with [facility] on keppra.       Polydactyly of thumb   Overactive bladder    Stable on myrbetriq and toviaz.      Hypothyroidism    Update labs.       Hyperlipidemia    Update labs.       Health maintenance examination - Primary    Preventative protocols reviewed and updated unless pt declined. Discussed healthy diet and lifestyle.       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HCC)   Alzheimer disease (HCC)    Appreciate neuro  care - continue aricept.       Relevant Medications   sertraline (ZOLOFT) 100 MG tablet   Advanced care planning/counseling discussion    Advanced directive discussion -has not set up. Packet provided today. Would want husband to be HCPOA then brother and BIL.        Other Visit Diagnoses    Breast cancer screening       Relevant Orders   MM 3D SCREEN BREAST BILATERAL   Estrogen deficiency       Relevant Orders   DG Bone Density       Meds ordered this encounter  Medications  . sertraline (ZOLOFT) 100 MG tablet    Sig: Take 1 tablet (100 mg total) by mouth daily.    Dispense:  90 tablet    Refill:  1   Orders Placed This Encounter  Procedures  . MM 3D SCREEN BREAST BILATERAL    Standing Status:   Future    Standing Expiration Date:   04/02/2020    Order Specific Question:   Reason for Exam (SYMPTOM  OR DIAGNOSIS REQUIRED)    Answer:   breast cancer screening    Order Specific Question:   Preferred imaging location?    Answer:   [facility]  . DG Bone Density    Standing Status:   Future    Standing Expiration Date:   04/02/2020    Order Specific Question:   Reason for Exam (SYMPTOM  OR DIAGNOSIS REQUIRED)    Answer:   osteoporosis screen    Order Specific Question:   Preferred imaging location?    Answer:   [facility]    Follow up plan: Return in about 6 months (around 08/03/2019) for follow up visit.  Eustaquio BoydenJavier Thoren Hosang, MD

## 2019-01-31 NOTE — Assessment & Plan Note (Signed)
Advanced directive discussion -has not set up. Packet provided today. Would want husband to be HCPOA then brother and BIL.

## 2019-01-31 NOTE — Assessment & Plan Note (Signed)
Appreciate neuro care - continue aricept.

## 2019-01-31 NOTE — Assessment & Plan Note (Signed)
Preventative protocols reviewed and updated unless pt declined. Discussed healthy diet and lifestyle.  

## 2019-01-31 NOTE — Addendum Note (Signed)
Addended by: Ellamae Sia on: 01/31/2019 10:30 AM   Modules accepted: Orders

## 2019-01-31 NOTE — Assessment & Plan Note (Signed)
-  Continue propranolol 

## 2019-01-31 NOTE — Assessment & Plan Note (Deleted)
Right side

## 2019-02-02 ENCOUNTER — Other Ambulatory Visit: Payer: Self-pay | Admitting: Family Medicine

## 2019-02-02 MED ORDER — VITAMIN D 50 MCG (2000 UT) PO CAPS: 1.0000 | ORAL_CAPSULE | Freq: Every day | ORAL | Status: AC

## 2019-02-20 ENCOUNTER — Other Ambulatory Visit: Payer: Self-pay | Admitting: Neurology

## 2019-02-21 ENCOUNTER — Telehealth: Payer: Self-pay | Admitting: Family Medicine

## 2019-02-21 NOTE — Telephone Encounter (Signed)
Patient's Husband called and stated that a case worker came out to their home and discussed having them moved to an assistant living. He stated that the case worked asked them to contact our office to have a [place] form filled out and mailed to [facility].    Patient's C/B # [phone]

## 2019-02-21 NOTE — Telephone Encounter (Signed)
Ok to do. plz start FL2 form for pt and spouse.  

## 2019-02-22 NOTE — Telephone Encounter (Signed)
Started FL2 form.  Placed in Dr. G's box.  

## 2019-02-25 NOTE — Telephone Encounter (Signed)
Faxed form.

## 2019-02-25 NOTE — Telephone Encounter (Signed)
Filled and in Lisa's box 

## 2019-03-08 ENCOUNTER — Other Ambulatory Visit: Payer: Self-pay | Admitting: Family Medicine

## 2019-04-11 ENCOUNTER — Ambulatory Visit: Payer: Medicare HMO

## 2019-04-30 ENCOUNTER — Ambulatory Visit (INDEPENDENT_AMBULATORY_CARE_PROVIDER_SITE_OTHER): Payer: Medicare HMO

## 2019-04-30 DIAGNOSIS — Z23 Encounter for immunization: Secondary | ICD-10-CM | POA: Diagnosis not present

## 2019-05-02 ENCOUNTER — Other Ambulatory Visit: Payer: Self-pay

## 2019-05-02 MED ORDER — SERTRALINE HCL 100 MG PO TABS: 100.0000 mg | ORAL_TABLET | Freq: Every day | ORAL | 1 refills | Status: DC

## 2019-05-02 MED ORDER — TOVIAZ 8 MG PO TB24: ORAL_TABLET | ORAL | 1 refills | Status: DC

## 2019-05-02 NOTE — Telephone Encounter (Signed)
E-scribed refills.  

## 2019-05-06 ENCOUNTER — Other Ambulatory Visit: Payer: Self-pay

## 2019-05-06 ENCOUNTER — Encounter: Payer: Self-pay | Admitting: Family Medicine

## 2019-05-06 ENCOUNTER — Ambulatory Visit (INDEPENDENT_AMBULATORY_CARE_PROVIDER_SITE_OTHER): Payer: Medicare HMO | Admitting: Family Medicine

## 2019-05-06 VITALS — BP 140/78 | HR 72 | Temp 98.1°F | Ht 59.75 in | Wt 148.3 lb

## 2019-05-06 DIAGNOSIS — K529 Noninfective gastroenteritis and colitis, unspecified: Secondary | ICD-10-CM

## 2019-05-06 NOTE — Assessment & Plan Note (Addendum)
Chronic issue, acutely worse over the last 1-2 months of unclear etiology. Check GI pathogen panel for infectious cause. Encouraged limit added sugars in case osmotic diarrhea. Continue imodium for now. If unrevealing and ongoing, will further evaluate with labwork. Pt/husband agree with plan. Doubt metformin related given she's been on this med for several years.

## 2019-05-06 NOTE — Addendum Note (Signed)
Addended by: Ellamae Sia on: 05/06/2019 08:44 AM   Modules accepted: Orders

## 2019-05-06 NOTE — Progress Notes (Signed)
This visit was conducted in person.  BP 140/78   Pulse 72   Temp 98.1 F (36.7 C)   Ht 4' 11.75" (1.518 m)   Wt 148 lb 5 oz (67.3 kg)   SpO2 95%   BMI 29.21 kg/m    CC: diarrhea Subjective:    Patient ID: Leah Payne, female    DOB: April 19, 1946, 73 y.o.   MRN: [id number]  HPI: Leah Payne is a 73 y.o. female presenting on 05/06/2019 for Discuss chronic diarrhea   Diarrhea present for the past 1 month, treating with imodium up to 4 a day with mild benefit. Last stool was 2d ago, 1-2 times a day. Loose to watery stool. Having accidents. No blood in stool, mucous in stool. No appetite. Chronic rhinorrhea treated with allergy tablets x2 months.   No fevers/chills, nausea/vomiting, abd pain, blood in stool, denies dysuria or urinary symptoms. No GERD, indigestion.   Has had diarrhea in the past, but this is worst.   On low dose metformin for last several years.  No new foods or restaurant, no recent travel.  Diarrhea may be worse when she eats sweets.  Using city water over the last few months since moving into new apartments.  No significant night time symptoms.      Relevant past medical, surgical, family and social history reviewed and updated as indicated. Interim medical history since our last visit reviewed. Allergies and medications reviewed and updated. Outpatient Medications Prior to Visit  Medication Sig Dispense Refill  . acetaminophen (TYLENOL) 500 MG tablet Take 1 tablet (500 mg total) by mouth every 8 (eight) hours as needed.  0  . atorvastatin (LIPITOR) 40 MG tablet TAKE 1 TABLET EVERY DAY 90 tablet 1  . Cholecalciferol (VITAMIN D) 50 MCG (2000 UT) CAPS Take 1 capsule (2,000 Units total) by mouth daily. 30 capsule   . donepezil (ARICEPT) 10 MG tablet TAKE 1 TABLET EVERY DAY 90 tablet 4  . fesoterodine ([name]) 8 MG TB24 tablet TAKE 1 TABLET (8 MG TOTAL) BY MOUTH DAILY. 90 tablet 1  . levETIRAcetam (KEPPRA) 500 MG tablet TAKE 1 TABLET TWICE DAILY 180  tablet 4  . levothyroxine (SYNTHROID) 25 MCG tablet TAKE 1 TABLET DAILY BEFORE BREAKFAST. 90 tablet 1  . meclizine (ANTIVERT) 25 MG tablet TAKE 1/2 TO 1 TABLET TWICE DAILY AS NEEDED FOR DIZZINESS 60 tablet 0  . metFORMIN (GLUCOPHAGE) 500 MG tablet TAKE 1 TABLET EVERY DAY WITH BREAKFAST 90 tablet 1  . mirabegron ER ([name]) 50 MG TB24 tablet Take 1 tablet (50 mg total) by mouth at bedtime. 90 tablet 1  . propranolol (INDERAL) 10 MG tablet TAKE 1 TABLET TWICE DAILY 180 tablet 1  . sertraline (ZOLOFT) 100 MG tablet Take 1 tablet (100 mg total) by mouth daily. 90 tablet 1   No facility-administered medications prior to visit.      Per HPI unless specifically indicated in ROS section below Review of Systems Objective:    BP 140/78   Pulse 72   Temp 98.1 F (36.7 C)   Ht 4' 11.75" (1.518 m)   Wt 148 lb 5 oz (67.3 kg)   SpO2 95%   BMI 29.21 kg/m   Wt Readings from Last 3 Encounters:  05/06/19 148 lb 5 oz (67.3 kg)  01/31/19 143 lb 5 oz (65 kg)  06/11/18 138 lb 12 oz (62.9 kg)    Physical Exam Vitals signs and nursing note reviewed.  Constitutional:      General:  She is not in acute distress.    Appearance: Normal appearance. She is not ill-appearing.     Comments: In wheelchair  HENT:     Head: Normocephalic and atraumatic.     Mouth/Throat:     Mouth: Mucous membranes are moist.     Pharynx: Oropharynx is clear. No posterior oropharyngeal erythema.  Cardiovascular:     Rate and Rhythm: Normal rate and regular rhythm.     Pulses: Normal pulses.     Heart sounds: Normal heart sounds. No murmur.  Pulmonary:     Effort: Pulmonary effort is normal. No respiratory distress.     Breath sounds: Normal breath sounds. No wheezing, rhonchi or rales.  Abdominal:     General: Abdomen is flat. Bowel sounds are normal. There is no distension.     Palpations: Abdomen is soft. There is no mass.     Tenderness: There is no abdominal tenderness. There is no guarding or rebound.      Hernia: No hernia is present.  Musculoskeletal:     Right lower leg: No edema.     Left lower leg: No edema.  Skin:    Findings: No rash.  Neurological:     Mental Status: She is alert.       Results for orders placed or performed in visit on 01/31/19  Hemoglobin A1c  Result Value Ref Range   Hgb A1c MFr Bld 6.2 4.6 - 6.5 %  T4, free  Result Value Ref Range   Free T4 0.91 0.60 - 1.60 ng/dL  Comprehensive metabolic panel  Result Value Ref Range   Sodium 137 135 - 145 mEq/L   Potassium 4.6 3.5 - 5.1 mEq/L   Chloride 101 96 - 112 mEq/L   CO2 30 19 - 32 mEq/L   Glucose, Bld 113 (H) 70 - 99 mg/dL   BUN 7 6 - 23 mg/dL   Creatinine, Ser 0.68 0.40 - 1.20 mg/dL   Total Bilirubin 0.4 0.2 - 1.2 mg/dL   Alkaline Phosphatase 71 39 - 117 U/L   AST 16 0 - 37 U/L   ALT 17 0 - 35 U/L   Total Protein 7.4 6.0 - 8.3 g/dL   Albumin 4.4 3.5 - 5.2 g/dL   Calcium 9.4 8.4 - 10.5 mg/dL   GFR 84.63 >60.00 mL/min  TSH  Result Value Ref Range   TSH 1.95 0.35 - 4.50 uIU/mL  Lipid panel  Result Value Ref Range   Cholesterol 127 0 - 200 mg/dL   Triglycerides 113.0 0.0 - 149.0 mg/dL   HDL 46.80 >39.00 mg/dL   VLDL 22.6 0.0 - 40.0 mg/dL   LDL Cholesterol 58 0 - 99 mg/dL   Total CHOL/HDL Ratio 3    NonHDL 80.49   VITAMIN D 25 Hydroxy (Vit-D Deficiency, Fractures)  Result Value Ref Range   VITD 20.80 (L) 30.00 - 100.00 ng/mL   Assessment & Plan:   Problem List Items Addressed This Visit    Chronic diarrhea - Primary    Chronic issue, acutely worse over the last 1-2 months of unclear etiology. Check GI pathogen panel for infectious cause. Encouraged limit added sugars in case osmotic diarrhea. Continue imodium for now. If unrevealing and ongoing, will further evaluate with labwork. Pt/husband agree with plan. Doubt metformin related given she's been on this med for several years.       Relevant Orders   Gastrointestinal Pathogen Panel PCR       No orders of the defined types  were placed in  this encounter.  Orders Placed This Encounter  Procedures  . Gastrointestinal Pathogen Panel PCR   Patient Instructions  Pass by lab to pick up stool kit to further evaluate diarrhea Limit added sugars/sweets in diet.    Follow up plan: No follow-ups on file.  [name], MD

## 2019-05-06 NOTE — Patient Instructions (Signed)
Pass by lab to pick up stool kit to further evaluate diarrhea Limit added sugars/sweets in diet.

## 2019-05-17 ENCOUNTER — Telehealth: Payer: Self-pay

## 2019-05-17 ENCOUNTER — Emergency Department (HOSPITAL_COMMUNITY)
Admission: EM | Admit: 2019-05-17 | Discharge: 2019-05-17 | Disposition: A | Discharge status: Home / Self Care | Payer: Medicare HMO | Attending: Emergency Medicine | Admitting: Emergency Medicine

## 2019-05-17 ENCOUNTER — Emergency Department (HOSPITAL_COMMUNITY): Payer: Medicare HMO

## 2019-05-17 ENCOUNTER — Ambulatory Visit: Payer: Medicare HMO

## 2019-05-17 ENCOUNTER — Encounter (HOSPITAL_COMMUNITY): Payer: Self-pay | Admitting: Emergency Medicine

## 2019-05-17 ENCOUNTER — Other Ambulatory Visit: Payer: Medicare HMO

## 2019-05-17 ENCOUNTER — Other Ambulatory Visit: Payer: Self-pay

## 2019-05-17 DIAGNOSIS — R748 Abnormal levels of other serum enzymes: Secondary | ICD-10-CM | POA: Insufficient documentation

## 2019-05-17 DIAGNOSIS — R140 Abdominal distension (gaseous): Secondary | ICD-10-CM | POA: Diagnosis not present

## 2019-05-17 DIAGNOSIS — Z7984 Long term (current) use of oral hypoglycemic drugs: Secondary | ICD-10-CM | POA: Diagnosis not present

## 2019-05-17 DIAGNOSIS — E119 Type 2 diabetes mellitus without complications: Secondary | ICD-10-CM | POA: Diagnosis not present

## 2019-05-17 DIAGNOSIS — I10 Essential (primary) hypertension: Secondary | ICD-10-CM | POA: Diagnosis not present

## 2019-05-17 DIAGNOSIS — E039 Hypothyroidism, unspecified: Secondary | ICD-10-CM | POA: Insufficient documentation

## 2019-05-17 DIAGNOSIS — R110 Nausea: Secondary | ICD-10-CM | POA: Diagnosis not present

## 2019-05-17 DIAGNOSIS — R1110 Vomiting, unspecified: Secondary | ICD-10-CM | POA: Diagnosis not present

## 2019-05-17 DIAGNOSIS — R197 Diarrhea, unspecified: Secondary | ICD-10-CM | POA: Diagnosis not present

## 2019-05-17 DIAGNOSIS — Z79899 Other long term (current) drug therapy: Secondary | ICD-10-CM | POA: Diagnosis not present

## 2019-05-17 DIAGNOSIS — G309 Alzheimer's disease, unspecified: Secondary | ICD-10-CM | POA: Insufficient documentation

## 2019-05-17 LAB — LIPASE, BLOOD: Lipase: 118 U/L — ABNORMAL HIGH (ref 11–51)

## 2019-05-17 LAB — COMPREHENSIVE METABOLIC PANEL
ALT: 18 U/L (ref 0–44)
AST: 19 U/L (ref 15–41)
Albumin: 4.5 g/dL (ref 3.5–5.0)
Alkaline Phosphatase: 82 U/L (ref 38–126)
Anion gap: 6 (ref 5–15)
BUN: 10 mg/dL (ref 8–23)
CO2: 26 mmol/L (ref 22–32)
Calcium: 9.5 mg/dL (ref 8.9–10.3)
Chloride: 103 mmol/L (ref 98–111)
Creatinine, Ser: 0.58 mg/dL (ref 0.44–1.00)
GFR calc Af Amer: >60 mL/min (ref >60)
GFR calc non Af Amer: >60 mL/min (ref >60)
Glucose, Bld: 113 mg/dL — ABNORMAL HIGH (ref 70–99)
Potassium: 4.4 mmol/L (ref 3.5–5.1)
Sodium: 135 mmol/L (ref 135–145)
Total Bilirubin: 0.8 mg/dL (ref 0.3–1.2)
Total Protein: 8.4 g/dL — ABNORMAL HIGH (ref 6.5–8.1)

## 2019-05-17 LAB — URINALYSIS, ROUTINE W REFLEX MICROSCOPIC
Bilirubin Urine: NEGATIVE
Glucose, UA: NEGATIVE mg/dL
Hgb urine dipstick: NEGATIVE
Ketones, ur: NEGATIVE mg/dL
Leukocytes,Ua: NEGATIVE
Nitrite: NEGATIVE
Protein, ur: NEGATIVE mg/dL
Specific Gravity, Urine: 1.025 (ref 1.005–1.030)
pH: 6 (ref 5.0–8.0)

## 2019-05-17 LAB — CBC
HCT: 45.1 % (ref 36.0–46.0)
Hemoglobin: 13.8 g/dL (ref 12.0–15.0)
MCH: 27.4 pg (ref 26.0–34.0)
MCHC: 30.6 g/dL (ref 30.0–36.0)
MCV: 89.7 fL (ref 80.0–100.0)
Platelets: 322 10*3/uL (ref 150–400)
RBC: 5.03 MIL/uL (ref 3.87–5.11)
RDW: 12.4 % (ref 11.5–15.5)
WBC: 6.3 10*3/uL (ref 4.0–10.5)
nRBC: 0 % (ref 0.0–0.2)

## 2019-05-17 MED ORDER — SODIUM CHLORIDE 0.9% FLUSH
3.0000 mL | Freq: Once | INTRAVENOUS | Status: AC
  Administered 2019-05-17: 3 mL via INTRAVENOUS

## 2019-05-17 MED ORDER — ONDANSETRON 4 MG PO TBDP: 4.0000 mg | ORAL_TABLET | Freq: Three times a day (TID) | ORAL | 0 refills | Status: DC | PRN

## 2019-05-17 MED ORDER — IOHEXOL 300 MG/ML  SOLN
100.0000 mL | Freq: Once | INTRAMUSCULAR | Status: AC | PRN
  Administered 2019-05-17: 100 mL via INTRAVENOUS

## 2019-05-17 NOTE — Discharge Instructions (Addendum)
Your CT imaging suggests some inflammation in your distal colon/rectal area which could be from your recent diarrhea.  We recommend a liquid diet for the next 2 days (broth, jello) and fluids to stay hydrated. Your lipase is elevated tonight, but your CT scan does not show any obvious problems with your pancreas.  I recommend a lab visit with your primary clinic next week to recheck your lipase level to ensure it has returned to baseline.  Return here over the weekend if you develop any fevers, abdominal pain or worsened nausea, vomiting or diarrhea.

## 2019-05-17 NOTE — Telephone Encounter (Signed)
Mr. Nieland, patient's husband, called states patient has had trouble with diarrhea for 2 weeks or so now but it is just keeps getting worse and today it is worse than ever-keep running to the bathroom, and patient just started to vomit today which is new. Advised husband to take patient to ER right away. Husband verbalized understanding and stated they will go to [facility] which is less than a half mile away from their home.

## 2019-05-17 NOTE — ED Provider Notes (Signed)
[facility] EMERGENCY DEPARTMENT Provider Note   CSN: [id number] Arrival date & time: 05/17/19  1329     History   Chief Complaint Chief Complaint  Patient presents with  . Nausea  . Diarrhea    HPI Leah Payne is a 73 y.o. female with a history significant for DM, HTN, and chronic intermittent diarrhea since having cholecystectomy in 1978 per patient report presenting with nausea, non bloody emesis and diarrhea which started this morning. She reports approximately 3 episodes of vomiting this morning, none since but remains nauseated and 3 episodes of diarrhea, but not in the past 6 hours since taking an otc diarrhea medicine (she cannot recall name).  She denies fevers, chills, abdominal pain.  She also denies any recent travel or antibiotic use.  She is feeling much improved actually since arriving in the ed, but was desirous of being checked out regardless.  Also denies chest pain, sob, weakness, dysuria.     The history is provided by the patient.    Past Medical History:  Diagnosis Date  . Anomaly of cranium    frontal bossing, enlarged cranium, adult strabismus  . Diabetes mellitus type 2, controlled (HCC) 08/04/2013  . Heart disease   . History of chicken pox   . History of fainting   . 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 Hyperlipidemia 08/04/2013  . Hyponatremia 08/29/2014  . Preaxial polydactyly of right hand congenital  . Seizure disorder (HCC) 08/04/2013  . Tachycardia   . WPW (Wolff-Parkinson-White syndrome)    has seen Dr Donnie Aho    Patient Active Problem List   Diagnosis Date Noted  . Chronic diarrhea 05/06/2019  . 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 Candidal intertrigo 02/16/2018  . Fall at home, initial encounter 08/01/2017  . Abdominal distension 05/16/2017  . Hearing loss  of right ear due to cerumen impaction 05/16/2017  . Vitamin D deficiency 03/21/2017  . Medicare annual wellness visit, initial 09/23/2016  . Health maintenance examination 09/23/2016  . Advanced care planning/counseling discussion 09/23/2016  . Dizziness 03/23/2016  . Alzheimer disease (HCC) 12/17/2015  . Imbalance 12/17/2015  . WPW (Wolff-Parkinson-White syndrome)   . Type 2 diabetes, controlled, 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HCC) 09/17/2015  . Hypothyroidism 09/17/2015  . Overactive bladder 08/29/2014  . Seizure disorder (HCC) 08/04/2013  . 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HCC) 08/04/2013  . Hypertension 08/04/2013  . Hyperlipidemia 08/04/2013    Past Surgical History:  Procedure Laterality Date  . CHOLECYSTECTOMY  1978     OB History   No obstetric history on file.      Home Medications    Prior to Admission medications   Medication Sig Start Date End Date Taking? Authorizing Provider  acetaminophen (TYLENOL) 500 MG tablet Take 1 tablet (500 mg total) by mouth every 8 (eight) hours as needed. 03/01/18  Yes Eustaquio Boyden, MD  atorvastatin (LIPITOR) 40 MG tablet TAKE 1 TABLET EVERY DAY Patient taking differently: Take 40 mg by mouth daily at 6 PM. TAKE 1 TABLET EVERY DAY 03/11/19  Yes Eustaquio Boyden, MD  Cholecalciferol (VITAMIN D) 50 MCG (2000 UT) CAPS Take 1 capsule (2,000 Units total) by mouth daily. 02/02/19  Yes Eustaquio Boyden, MD  donepezil (ARICEPT) 10 MG tablet TAKE 1 TABLET EVERY DAY Patient taking differently: Take 10 mg by mouth at bedtime. TAKE 1 TABLET EVERY DAY 02/20/19  Yes Anson FretAhern, Antonia B, MD  fesoterodine ([name]) 8 MG TB24 tablet TAKE 1 TABLET (8 MG TOTAL) BY MOUTH DAILY. Patient taking differently: Take 8 mg by mouth daily. TAKE 1 TABLET (8 MG TOTAL) BY MOUTH DAILY. 05/02/19  Yes Eustaquio BoydenGutierrez, Javier, MD  levETIRAcetam (KEPPRA) 500 MG tablet TAKE 1 TABLET TWICE DAILY Patient taking differently: Take 500 mg by mouth 2 (two) times daily.  12/31/18  Yes Eustaquio BoydenGutierrez,  Javier, MD  levothyroxine (SYNTHROID) 25 MCG tablet TAKE 1 TABLET DAILY BEFORE BREAKFAST. Patient taking differently: Take 25 mcg by mouth daily before breakfast.  12/20/18  Yes Eustaquio BoydenGutierrez, Javier, MD  meclizine (ANTIVERT) 25 MG tablet TAKE 1/2 TO 1 TABLET TWICE DAILY AS NEEDED FOR DIZZINESS Patient taking differently: Take 12.5-25 mg by mouth 2 (two) times daily as needed for dizziness.  10/31/18  Yes Eustaquio BoydenGutierrez, Javier, MD  metFORMIN (GLUCOPHAGE) 500 MG tablet TAKE 1 TABLET EVERY DAY WITH BREAKFAST Patient taking differently: Take 500 mg by mouth daily with breakfast.  03/11/19  Yes Eustaquio BoydenGutierrez, Javier, MD  mirabegron ER ([name]) 50 MG TB24 tablet Take 1 tablet (50 mg total) by mouth at bedtime. 10/15/18  Yes Eustaquio BoydenGutierrez, Javier, MD  propranolol (INDERAL) 10 MG tablet TAKE 1 TABLET TWICE DAILY Patient taking differently: Take 10 mg by mouth 2 (two) times daily.  03/11/19  Yes Eustaquio BoydenGutierrez, Javier, MD  sertraline (ZOLOFT) 100 MG tablet Take 1 tablet (100 mg total) by mouth daily. 05/02/19  Yes Eustaquio BoydenGutierrez, Javier, MD  ondansetron (ZOFRAN ODT) 4 MG disintegrating tablet Take 1 tablet (4 mg total) by mouth every 8 (eight) hours as needed for nausea or vomiting. 05/17/19   Burgess AmorIdol, Elmon Shader, PA-C    Family History Family History  Problem Relation Age of Onset  . Stroke Mother 2292  . Pneumonia Mother   . CAD Father 1179  . Alzheimer's disease Father   . Pneumonia Father   . Cancer Maternal Aunt        lung (non-smoker)  . Diabetes Neg Hx     Social History Social History   Tobacco Use  . Smoking status: Never Smoker  . Smokeless tobacco: Never Used  Substance Use Topics  . Alcohol use: No    Alcohol/week: 0.0 standard drinks  . Drug use: No     Allergies   Patient has no known allergies.   Review of Systems Review of Systems  Constitutional: Negative for chills and fever.  HENT: Negative.   Eyes: Negative.   Respiratory: Negative for chest tightness and shortness of breath.   Cardiovascular:  Negative for chest pain.  Gastrointestinal: Positive for diarrhea, nausea and vomiting. Negative for abdominal distention, abdominal pain and blood in stool.  Genitourinary: Negative.   Musculoskeletal: Negative for arthralgias, joint swelling and neck pain.  Skin: Negative.  Negative for rash and wound.  Neurological: Negative for dizziness, weakness, light-headedness, numbness and headaches.  Psychiatric/Behavioral: Negative.      Physical Exam Updated Vital Signs BP 114/84   Pulse 67   Temp 98 F (36.7 C) (Oral)   Resp 18   Ht 5\' 2"  (1.575 m)   Wt 68.5 kg   SpO2 97%   BMI 27.62 kg/m   Physical Exam Vitals signs and nursing note reviewed.  Constitutional:      Appearance: She is well-developed.  HENT:     Head: Normocephalic and atraumatic.  Eyes:     Conjunctiva/sclera: Conjunctivae normal.  Neck:     Musculoskeletal: Normal range of motion.  Cardiovascular:  Rate and Rhythm: Normal rate and regular rhythm.     Heart sounds: Normal heart sounds.  Pulmonary:     Effort: Pulmonary effort is normal.     Breath sounds: Normal breath sounds. No wheezing.  Abdominal:     General: Bowel sounds are normal. There is distension.     Palpations: Abdomen is soft.     Tenderness: There is no abdominal tenderness. There is no guarding.     Comments: Increased tympany to percussion upper abdomen.  No guarding, pt denies pain.   Musculoskeletal: Normal range of motion.  Skin:    General: Skin is warm and dry.  Neurological:     Mental Status: She is alert.      ED Treatments / Results  Labs (all labs ordered are listed, but only abnormal results are displayed) Labs Reviewed  LIPASE, BLOOD - Abnormal; Notable for the following components:      Result Value   Lipase 118 (*)    All other components within normal limits  COMPREHENSIVE METABOLIC PANEL - Abnormal; Notable for the following components:   Glucose, Bld 113 (*)    Total Protein 8.4 (*)    All other  components within normal limits  URINALYSIS, ROUTINE W REFLEX MICROSCOPIC - Abnormal; Notable for the following components:   Color, Urine STRAW (*)    All other components within normal limits  CBC    EKG None  Radiology Ct Abdomen Pelvis W Contrast  Result Date: 05/17/2019 CLINICAL DATA:  Abdominal distension, nausea, vomiting EXAM: CT ABDOMEN AND PELVIS WITH CONTRAST TECHNIQUE: Multidetector CT imaging of the abdomen and pelvis was performed using the standard protocol following bolus administration of intravenous contrast. CONTRAST:  OMNIPAQUE IOHEXOL 300 MG/ML  SOLN COMPARISON:  May 24, 2017 FINDINGS: Lower chest: The visualized heart size within normal limits. No pericardial fluid/thickening. There is a small hiatal hernia. The visualized portions of the lungs are clear. Hepatobiliary: The liver is normal in density without focal abnormality.The main portal vein is patent. The patient is status post cholecystectomy. No biliary ductal dilation. Pancreas: Unremarkable. No pancreatic ductal dilatation or surrounding inflammatory changes. Spleen: Normal in size without focal abnormality. Adrenals/Urinary Tract: Both adrenal glands appear normal. The kidneys and collecting system appear normal without evidence of urinary tract calculus or hydronephrosis. Bladder is unremarkable. Stomach/Bowel: The stomach and small bowel are normal in appearance. There appears to be mild posterior rectal wall thickening with mild fat stranding changes. The remainder of the colon is unremarkable. There are scattered colonic diverticula without surrounding fat stranding changes. Vascular/Lymphatic: There are no enlarged mesenteric, retroperitoneal, or pelvic lymph nodes. Scattered aortic atherosclerotic calcifications are seen without aneurysmal dilatation. Reproductive: The uterus and adnexa are unremarkable. Other: No evidence of abdominal wall mass or hernia. Musculoskeletal: No acute or significant osseous  findings. There is a chronic nondisplaced buckle deformity of the sacrococcygeal articulation. IMPRESSION: Mild rectal wall thickening with mild surrounding fat stranding changes which could be due to proctitis. Electronically Signed   By: Jonna Clark M.D.   On: 05/17/2019 20:28    Procedures Procedures (including critical care time)  Medications Ordered in ED Medications  sodium chloride flush (NS) 0.9 % injection 3 mL (3 mLs Intravenous Given 05/17/19 2044)  iohexol (OMNIPAQUE) 300 MG/ML solution 100 mL (100 mLs Intravenous Contrast Given 05/17/19 2008)     Initial Impression / Assessment and Plan / ED Course  I have reviewed the triage vital signs and the nursing notes.  Pertinent labs &  imaging results that were available during my care of the patient were reviewed by me and considered in my medical decision making (see chart for details).        Labs reviewed, pt with increased lipase of unclear etiology. Re-exam - still not ttp upper abd, specifically LUQ.  She denies back pain.  CT imaging to rule out pancreas pathology, reviewed and unremarkable study except for possible mild proctitis.  Pancreas appears normal.  advised clear liquid diet, increased fluid intake x 48 hours, may then resume normal diet.  Prescribed zofran for prn use, although pt continued to deny nausea during ed stay.  OTC anti diarrheal prn.  Advised also lab visit with pcp for a recheck of her lipase within one week.  Return precautions outlined.  Suspects viral GI process.   The patient appears reasonably screened and/or stabilized for discharge and I doubt any other medical condition or other [facility] requiring further screening, evaluation, or treatment in the ED at this time prior to discharge.  Discussed with Dr. Roderic Palau prior to dc home.   Final Clinical Impressions(s) / ED Diagnoses   Final diagnoses:  Nausea  Diarrhea, unspecified type  Elevated lipase    ED Discharge Orders         Ordered     ondansetron (ZOFRAN ODT) 4 MG disintegrating tablet  Every 8 hours PRN     05/17/19 2126           Evalee Jefferson, PA-C 05/18/19 1310    Milton Ferguson, MD 05/19/19 2016

## 2019-05-17 NOTE — ED Notes (Signed)
Pt had large amount of stool in perineal area. Cleaned area, perineum is very red. Verbal order for in and out cath. Perineal care done, sterile technique used, and urine specimen collected by myself with nurse tech present.

## 2019-05-17 NOTE — ED Notes (Addendum)
Pt called spouse for ride home-this nurse spoke with spouse- he will pick up

## 2019-05-17 NOTE — ED Notes (Signed)
Pt assisted with getting dressed- new brief placed on pt prior to getting dressed.

## 2019-05-17 NOTE — ED Triage Notes (Signed)
Pt states that she has n/v/d that started today.

## 2019-05-17 NOTE — ED Notes (Signed)
Jamie with lab called to inform, not enough urine collected for a urine microanalysis-primary nurse, Heather,RN made aware.

## 2019-05-27 ENCOUNTER — Encounter: Payer: Self-pay | Admitting: Family Medicine

## 2019-05-27 NOTE — Progress Notes (Deleted)
This visit was conducted in person.  There were no vitals taken for this visit.   CC: ER f/u visit Subjective:    Patient ID: Leah Payne, female    DOB: 1945/09/25, 73 y.o.   MRN: [id number]  HPI: Leah Payne is a 73 y.o. female presenting on 05/28/2019 for No chief complaint on file.   Recent ER visit for abd pain/distension associated with diarrhea, nausea and vomiting. Records reviewed. Lipase was mildly elevated. Contrasted CT abd/pelvis showed mild rectal thickening ?proctitis.   ***  H/o chronic diarrhea managed with imodium up to 4 a day. No night time symptoms. On low dose metformin for years. S/p cholecystectomy.  GI pathogen panel from last month still pending.     Relevant past medical, surgical, family and social history reviewed and updated as indicated. Interim medical history since our last visit reviewed. Allergies and medications reviewed and updated. Outpatient Medications Prior to Visit  Medication Sig Dispense Refill  . acetaminophen (TYLENOL) 500 MG tablet Take 1 tablet (500 mg total) by mouth every 8 (eight) hours as needed.  0  . atorvastatin (LIPITOR) 40 MG tablet TAKE 1 TABLET EVERY DAY (Patient taking differently: Take 40 mg by mouth daily at 6 PM. TAKE 1 TABLET EVERY DAY) 90 tablet 1  . Cholecalciferol (VITAMIN D) 50 MCG (2000 UT) CAPS Take 1 capsule (2,000 Units total) by mouth daily. 30 capsule   . donepezil (ARICEPT) 10 MG tablet TAKE 1 TABLET EVERY DAY (Patient taking differently: Take 10 mg by mouth at bedtime. TAKE 1 TABLET EVERY DAY) 90 tablet 4  . fesoterodine ([name]) 8 MG TB24 tablet TAKE 1 TABLET (8 MG TOTAL) BY MOUTH DAILY. (Patient taking differently: Take 8 mg by mouth daily. TAKE 1 TABLET (8 MG TOTAL) BY MOUTH DAILY.) 90 tablet 1  . levETIRAcetam (KEPPRA) 500 MG tablet TAKE 1 TABLET TWICE DAILY (Patient taking differently: Take 500 mg by mouth 2 (two) times daily. ) 180 tablet 4  . levothyroxine (SYNTHROID) 25 MCG tablet TAKE 1  TABLET DAILY BEFORE BREAKFAST. (Patient taking differently: Take 25 mcg by mouth daily before breakfast. ) 90 tablet 1  . meclizine (ANTIVERT) 25 MG tablet TAKE 1/2 TO 1 TABLET TWICE DAILY AS NEEDED FOR DIZZINESS (Patient taking differently: Take 12.5-25 mg by mouth 2 (two) times daily as needed for dizziness. ) 60 tablet 0  . metFORMIN (GLUCOPHAGE) 500 MG tablet TAKE 1 TABLET EVERY DAY WITH BREAKFAST (Patient taking differently: Take 500 mg by mouth daily with breakfast. ) 90 tablet 1  . mirabegron ER ([name]) 50 MG TB24 tablet Take 1 tablet (50 mg total) by mouth at bedtime. 90 tablet 1  . ondansetron (ZOFRAN ODT) 4 MG disintegrating tablet Take 1 tablet (4 mg total) by mouth every 8 (eight) hours as needed for nausea or vomiting. 20 tablet 0  . propranolol (INDERAL) 10 MG tablet TAKE 1 TABLET TWICE DAILY (Patient taking differently: Take 10 mg by mouth 2 (two) times daily. ) 180 tablet 1  . sertraline (ZOLOFT) 100 MG tablet Take 1 tablet (100 mg total) by mouth daily. 90 tablet 1   No facility-administered medications prior to visit.      Per HPI unless specifically indicated in ROS section below Review of Systems Objective:    There were no vitals taken for this visit.  Wt Readings from Last 3 Encounters:  05/17/19 151 lb (68.5 kg)  05/06/19 148 lb 5 oz (67.3 kg)  01/31/19 143 lb 5 oz (65  kg)    Physical Exam    Results for orders placed or performed during the hospital encounter of 05/17/19  Lipase, blood  Result Value Ref Range   Lipase 118 (H) 11 - 51 U/L  Comprehensive metabolic panel  Result Value Ref Range   Sodium 135 135 - 145 mmol/L   Potassium 4.4 3.5 - 5.1 mmol/L   Chloride 103 98 - 111 mmol/L   CO2 26 22 - 32 mmol/L   Glucose, Bld 113 (H) 70 - 99 mg/dL   BUN 10 8 - 23 mg/dL   Creatinine, Ser 0.58 0.44 - 1.00 mg/dL   Calcium 9.5 8.9 - 10.3 mg/dL   Total Protein 8.4 (H) 6.5 - 8.1 g/dL   Albumin 4.5 3.5 - 5.0 g/dL   AST 19 15 - 41 U/L   ALT 18 0 - 44 U/L    Alkaline Phosphatase 82 38 - 126 U/L   Total Bilirubin 0.8 0.3 - 1.2 mg/dL   GFR calc non Af Amer >60 >60 mL/min   GFR calc Af Amer >60 >60 mL/min   Anion gap 6 5 - 15  CBC  Result Value Ref Range   WBC 6.3 4.0 - 10.5 K/uL   RBC 5.03 3.87 - 5.11 MIL/uL   Hemoglobin 13.8 12.0 - 15.0 g/dL   HCT 45.1 36.0 - 46.0 %   MCV 89.7 80.0 - 100.0 fL   MCH 27.4 26.0 - 34.0 pg   MCHC 30.6 30.0 - 36.0 g/dL   RDW 12.4 11.5 - 15.5 %   Platelets 322 150 - 400 K/uL   nRBC 0.0 0.0 - 0.2 %  Urinalysis, Routine w reflex microscopic  Result Value Ref Range   Color, Urine STRAW (A) YELLOW   APPearance CLEAR CLEAR   Specific Gravity, Urine 1.025 1.005 - 1.030   pH 6.0 5.0 - 8.0   Glucose, UA NEGATIVE NEGATIVE mg/dL   Hgb urine dipstick NEGATIVE NEGATIVE   Bilirubin Urine NEGATIVE NEGATIVE   Ketones, ur NEGATIVE NEGATIVE mg/dL   Protein, ur NEGATIVE NEGATIVE mg/dL   Nitrite NEGATIVE NEGATIVE   Leukocytes,Ua NEGATIVE NEGATIVE   Assessment & Plan:   Problem List Items Addressed This Visit    None       No orders of the defined types were placed in this encounter.  No orders of the defined types were placed in this encounter.   Follow up plan: No follow-ups on file.  [name], MD

## 2019-05-28 ENCOUNTER — Ambulatory Visit: Payer: Medicare HMO | Admitting: Family Medicine

## 2019-05-31 ENCOUNTER — Encounter: Payer: Self-pay | Admitting: Family Medicine

## 2019-05-31 ENCOUNTER — Ambulatory Visit (INDEPENDENT_AMBULATORY_CARE_PROVIDER_SITE_OTHER): Payer: Medicare HMO | Admitting: Family Medicine

## 2019-05-31 ENCOUNTER — Other Ambulatory Visit: Payer: Self-pay

## 2019-05-31 VITALS — BP 152/88 | HR 68 | Temp 97.8°F | Ht 62.0 in | Wt 145.1 lb

## 2019-05-31 DIAGNOSIS — F0281 Dementia in other diseases classified elsewhere with behavioral disturbance: Secondary | ICD-10-CM | POA: Diagnosis not present

## 2019-05-31 DIAGNOSIS — I10 Essential (primary) hypertension: Secondary | ICD-10-CM | POA: Diagnosis not present

## 2019-05-31 DIAGNOSIS — Q039 Congenital hydrocephalus, unspecified: Secondary | ICD-10-CM

## 2019-05-31 DIAGNOSIS — F02818 Dementia in other diseases classified elsewhere, unspecified severity, with other behavioral disturbance: Secondary | ICD-10-CM

## 2019-05-31 DIAGNOSIS — K529 Noninfective gastroenteritis and colitis, unspecified: Secondary | ICD-10-CM | POA: Diagnosis not present

## 2019-05-31 DIAGNOSIS — K6289 Other specified diseases of anus and rectum: Secondary | ICD-10-CM | POA: Diagnosis not present

## 2019-05-31 DIAGNOSIS — G301 Alzheimer's disease with late onset: Secondary | ICD-10-CM | POA: Diagnosis not present

## 2019-05-31 MED ORDER — LORATADINE 10 MG PO TABS: 10.0000 mg | ORAL_TABLET | Freq: Every day | ORAL | Status: DC

## 2019-05-31 NOTE — Assessment & Plan Note (Addendum)
Reassuring labwork and CT scan at ER reviewed with patient and husband. There was evidence of proctitis. They have been unable to complete GI pathogen panel. Reviewed collection technique, gloves provided, advised return ASAP to r/o infectious process. Symptoms not consistent with pancreatitis despite elevated lipase.  ?postcholecystectomy bile acid diarrhea - if above unrevealing, consider trial of bile-acid binding resin (cholestyramine or colestipol) and referral to GI.

## 2019-05-31 NOTE — Patient Instructions (Addendum)
Return GI pathogen panel today if you can (using hat and gloves).  Watch blood pressures at home, let me know if consistently >140/90.  We will be in touch with results.

## 2019-05-31 NOTE — Assessment & Plan Note (Signed)
BP elevated today, reports compliance with propranolol. Consider additional antihypertensive

## 2019-05-31 NOTE — Progress Notes (Signed)
This visit was conducted in person.  BP (!) 152/88 (BP Location: Right Arm, Patient Position: Sitting, Cuff Size: Normal)   Pulse 68   Temp 97.8 F (36.6 C) (Temporal)   Ht 5\' 2"  (1.575 m)   Wt 145 lb 1 oz (65.8 kg)   SpO2 97%   BMI 26.53 kg/m   BP Readings from Last 3 Encounters:  05/31/19 (!) 152/88  05/17/19 114/84  05/06/19 140/78  bp remains elevated on repeat  CC: ER f/u visit Subjective:    Patient ID: Leah BellsMargaret L Shammas, female    DOB: 1945-07-28, 73 y.o.   MRN: [id number]  HPI: Leah Payne is a 73 y.o. female presenting on 05/31/2019 for Hospitalization Follow-up (Pt accompanied by husband, Zollie BeckersWalter (temp, 97.8).)   Seen at ER 2 wks ago with acute worsening of chronic diarrhea since cholecystectomy 1978. She also had 3 episodes of vomiting on day of ER evaluation that did resolve with OTC antidiarrheal. Contrasted CT abd/pelvis showed mild rectal wall thickening with surrounding fat stranding consistent with proctitis, without lymphadenopathy. Lab work showed mild elevation in lipase but pancreas was normal on imaging.   They have still not submitted GI pathogen panel due to difficulty collecting - discussed using hat to collect.  Limiting added sugars and artificial sweeteners in diet didn't help.  Even when she eats small meals still has watery diarrhea.   Alavert helps rhinorrhea worse at night.   Has never completed colon cancer screening. Patient always minimizes symptoms "I feel better, it's better"     Relevant past medical, surgical, family and social history reviewed and updated as indicated. Interim medical history since our last visit reviewed. Allergies and medications reviewed and updated. Outpatient Medications Prior to Visit  Medication Sig Dispense Refill  . acetaminophen (TYLENOL) 500 MG tablet Take 1 tablet (500 mg total) by mouth every 8 (eight) hours as needed.  0  . atorvastatin (LIPITOR) 40 MG tablet TAKE 1 TABLET EVERY DAY (Patient  taking differently: Take 40 mg by mouth daily at 6 PM. TAKE 1 TABLET EVERY DAY) 90 tablet 1  . Cholecalciferol (VITAMIN D) 50 MCG (2000 UT) CAPS Take 1 capsule (2,000 Units total) by mouth daily. 30 capsule   . donepezil (ARICEPT) 10 MG tablet TAKE 1 TABLET EVERY DAY (Patient taking differently: Take 10 mg by mouth at bedtime. TAKE 1 TABLET EVERY DAY) 90 tablet 4  . fesoterodine ([name]) 8 MG TB24 tablet TAKE 1 TABLET (8 MG TOTAL) BY MOUTH DAILY. (Patient taking differently: Take 8 mg by mouth daily. TAKE 1 TABLET (8 MG TOTAL) BY MOUTH DAILY.) 90 tablet 1  . levETIRAcetam (KEPPRA) 500 MG tablet TAKE 1 TABLET TWICE DAILY (Patient taking differently: Take 500 mg by mouth 2 (two) times daily. ) 180 tablet 4  . levothyroxine (SYNTHROID) 25 MCG tablet TAKE 1 TABLET DAILY BEFORE BREAKFAST. (Patient taking differently: Take 25 mcg by mouth daily before breakfast. ) 90 tablet 1  . meclizine (ANTIVERT) 25 MG tablet TAKE 1/2 TO 1 TABLET TWICE DAILY AS NEEDED FOR DIZZINESS (Patient taking differently: Take 12.5-25 mg by mouth 2 (two) times daily as needed for dizziness. ) 60 tablet 0  . metFORMIN (GLUCOPHAGE) 500 MG tablet TAKE 1 TABLET EVERY DAY WITH BREAKFAST (Patient taking differently: Take 500 mg by mouth daily with breakfast. ) 90 tablet 1  . mirabegron ER ([name]) 50 MG TB24 tablet Take 1 tablet (50 mg total) by mouth at bedtime. 90 tablet 1  . propranolol (INDERAL) 10  MG tablet TAKE 1 TABLET TWICE DAILY (Patient taking differently: Take 10 mg by mouth 2 (two) times daily. ) 180 tablet 1  . sertraline (ZOLOFT) 100 MG tablet Take 1 tablet (100 mg total) by mouth daily. 90 tablet 1  . ondansetron (ZOFRAN ODT) 4 MG disintegrating tablet Take 1 tablet (4 mg total) by mouth every 8 (eight) hours as needed for nausea or vomiting. 20 tablet 0   No facility-administered medications prior to visit.      Per HPI unless specifically indicated in ROS section below Review of Systems Objective:    BP (!) 152/88  (BP Location: Right Arm, Patient Position: Sitting, Cuff Size: Normal)   Pulse 68   Temp 97.8 F (36.6 C) (Temporal)   Ht 5\' 2"  (1.575 m)   Wt 145 lb 1 oz (65.8 kg)   SpO2 97%   BMI 26.53 kg/m   Wt Readings from Last 3 Encounters:  05/31/19 145 lb 1 oz (65.8 kg)  05/17/19 151 lb (68.5 kg)  05/06/19 148 lb 5 oz (67.3 kg)    Physical Exam Vitals signs and nursing note reviewed.  Constitutional:      General: She is not in acute distress.    Appearance: Normal appearance. She is not ill-appearing.  Cardiovascular:     Rate and Rhythm: Normal rate and regular rhythm.     Pulses: Normal pulses.     Heart sounds: Normal heart sounds. No murmur.  Pulmonary:     Effort: Pulmonary effort is normal. No respiratory distress.     Breath sounds: Normal breath sounds. No wheezing, rhonchi or rales.  Abdominal:     General: Abdomen is flat. Bowel sounds are normal. There is distension (mild).     Palpations: Abdomen is soft. There is no mass.     Tenderness: There is no abdominal tenderness. There is no guarding or rebound.     Hernia: No hernia is present.  Neurological:     Mental Status: She is alert.  Psychiatric:        Mood and Affect: Mood normal.       Results for orders placed or performed during the hospital encounter of 05/17/19  Lipase, blood  Result Value Ref Range   Lipase 118 (H) 11 - 51 U/L  Comprehensive metabolic panel  Result Value Ref Range   Sodium 135 135 - 145 mmol/L   Potassium 4.4 3.5 - 5.1 mmol/L   Chloride 103 98 - 111 mmol/L   CO2 26 22 - 32 mmol/L   Glucose, Bld 113 (H) 70 - 99 mg/dL   BUN 10 8 - 23 mg/dL   Creatinine, Ser 0.58 0.44 - 1.00 mg/dL   Calcium 9.5 8.9 - 10.3 mg/dL   Total Protein 8.4 (H) 6.5 - 8.1 g/dL   Albumin 4.5 3.5 - 5.0 g/dL   AST 19 15 - 41 U/L   ALT 18 0 - 44 U/L   Alkaline Phosphatase 82 38 - 126 U/L   Total Bilirubin 0.8 0.3 - 1.2 mg/dL   GFR calc non Af Amer >60 >60 mL/min   GFR calc Af Amer >60 >60 mL/min   Anion gap 6  5 - 15  CBC  Result Value Ref Range   WBC 6.3 4.0 - 10.5 K/uL   RBC 5.03 3.87 - 5.11 MIL/uL   Hemoglobin 13.8 12.0 - 15.0 g/dL   HCT 45.1 36.0 - 46.0 %   MCV 89.7 80.0 - 100.0 fL   MCH 27.4 26.0 -  34.0 pg   MCHC 30.6 30.0 - 36.0 g/dL   RDW 88.8 91.6 - 94.5 %   Platelets 322 150 - 400 K/uL   nRBC 0.0 0.0 - 0.2 %  Urinalysis, Routine w reflex microscopic  Result Value Ref Range   Color, Urine STRAW (A) YELLOW   APPearance CLEAR CLEAR   Specific Gravity, Urine 1.025 1.005 - 1.030   pH 6.0 5.0 - 8.0   Glucose, UA NEGATIVE NEGATIVE mg/dL   Hgb urine dipstick NEGATIVE NEGATIVE   Bilirubin Urine NEGATIVE NEGATIVE   Ketones, ur NEGATIVE NEGATIVE mg/dL   Protein, ur NEGATIVE NEGATIVE mg/dL   Nitrite NEGATIVE NEGATIVE   Leukocytes,Ua NEGATIVE NEGATIVE   Assessment & Plan:   Problem List Items Addressed This Visit    Hypertension    BP elevated today, reports compliance with propranolol. Consider additional antihypertensive      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HCC)   Chronic diarrhea - Primary    Reassuring labwork and CT scan at ER reviewed with patient and husband. There was evidence of proctitis. They have been unable to complete GI pathogen panel. Reviewed collection technique, gloves provided, advised return ASAP to r/o infectious process. Symptoms not consistent with pancreatitis despite elevated lipase.  ?postcholecystectomy bile acid diarrhea - if above unrevealing, consider trial of bile-acid binding resin (cholestyramine or colestipol) and referral to GI.       Alzheimer disease (HCC)    Other Visit Diagnoses    Proctitis           Meds ordered this encounter  Medications  . loratadine (CLARITIN) 10 MG tablet    Sig: Take 1 tablet (10 mg total) by mouth daily.   No orders of the defined types were placed in this encounter.   Patient Instructions  Return GI pathogen panel today if you can (using hat and gloves).  Watch blood pressures at home, let me know if  consistently >140/90.  We will be in touch with results.   Follow up plan: No follow-ups on file.  Eustaquio Boyden, MD

## 2019-06-05 ENCOUNTER — Ambulatory Visit: Payer: Self-pay | Admitting: Neurology

## 2019-07-26 ENCOUNTER — Other Ambulatory Visit: Payer: Self-pay

## 2019-07-26 ENCOUNTER — Emergency Department (HOSPITAL_COMMUNITY)
Admission: EM | Admit: 2019-07-26 | Discharge: 2019-07-31 | Disposition: A | Discharge status: Skilled Nursing Facility | Payer: Medicare HMO | Attending: Emergency Medicine | Admitting: Emergency Medicine

## 2019-07-26 ENCOUNTER — Encounter (HOSPITAL_COMMUNITY): Payer: Self-pay

## 2019-07-26 DIAGNOSIS — R531 Weakness: Secondary | ICD-10-CM

## 2019-07-26 DIAGNOSIS — Z79899 Other long term (current) drug therapy: Secondary | ICD-10-CM | POA: Diagnosis not present

## 2019-07-26 DIAGNOSIS — I10 Essential (primary) hypertension: Secondary | ICD-10-CM | POA: Diagnosis not present

## 2019-07-26 DIAGNOSIS — G309 Alzheimer's disease, unspecified: Secondary | ICD-10-CM | POA: Insufficient documentation

## 2019-07-26 DIAGNOSIS — R404 Transient alteration of awareness: Secondary | ICD-10-CM | POA: Diagnosis not present

## 2019-07-26 DIAGNOSIS — I959 Hypotension, unspecified: Secondary | ICD-10-CM | POA: Diagnosis not present

## 2019-07-26 DIAGNOSIS — Z7984 Long term (current) use of oral hypoglycemic drugs: Secondary | ICD-10-CM | POA: Diagnosis not present

## 2019-07-26 DIAGNOSIS — L8990 Pressure ulcer of unspecified site, unspecified stage: Secondary | ICD-10-CM | POA: Insufficient documentation

## 2019-07-26 DIAGNOSIS — E039 Hypothyroidism, unspecified: Secondary | ICD-10-CM | POA: Insufficient documentation

## 2019-07-26 DIAGNOSIS — M6281 Muscle weakness (generalized): Secondary | ICD-10-CM | POA: Diagnosis not present

## 2019-07-26 DIAGNOSIS — W19XXXA Unspecified fall, initial encounter: Secondary | ICD-10-CM | POA: Diagnosis not present

## 2019-07-26 DIAGNOSIS — Z20822 Contact with and (suspected) exposure to covid-19: Secondary | ICD-10-CM | POA: Insufficient documentation

## 2019-07-26 DIAGNOSIS — E119 Type 2 diabetes mellitus without complications: Secondary | ICD-10-CM | POA: Insufficient documentation

## 2019-07-26 LAB — CBC WITH DIFFERENTIAL/PLATELET
Abs Immature Granulocytes: 0.03 10*3/uL (ref 0.00–0.07)
Basophils Absolute: 0 10*3/uL (ref 0.0–0.1)
Basophils Relative: 0 %
Eosinophils Absolute: 0.6 10*3/uL — ABNORMAL HIGH (ref 0.0–0.5)
Eosinophils Relative: 7 %
HCT: 43 % (ref 36.0–46.0)
Hemoglobin: 14 g/dL (ref 12.0–15.0)
Immature Granulocytes: 0 %
Lymphocytes Relative: 8 %
Lymphs Abs: 0.7 10*3/uL (ref 0.7–4.0)
MCH: 28.3 pg (ref 26.0–34.0)
MCHC: 32.6 g/dL (ref 30.0–36.0)
MCV: 87 fL (ref 80.0–100.0)
Monocytes Absolute: 0.8 10*3/uL (ref 0.1–1.0)
Monocytes Relative: 9 %
Neutro Abs: 6.9 10*3/uL (ref 1.7–7.7)
Neutrophils Relative %: 76 %
Platelets: 293 10*3/uL (ref 150–400)
RBC: 4.94 MIL/uL (ref 3.87–5.11)
RDW: 12.7 % (ref 11.5–15.5)
WBC: 9.1 10*3/uL (ref 4.0–10.5)
nRBC: 0 % (ref 0.0–0.2)

## 2019-07-26 LAB — COMPREHENSIVE METABOLIC PANEL
ALT: 28 U/L (ref 0–44)
AST: 52 U/L — ABNORMAL HIGH (ref 15–41)
Albumin: 4.4 g/dL (ref 3.5–5.0)
Alkaline Phosphatase: 90 U/L (ref 38–126)
Anion gap: 12 (ref 5–15)
BUN: 11 mg/dL (ref 8–23)
CO2: 24 mmol/L (ref 22–32)
Calcium: 9.3 mg/dL (ref 8.9–10.3)
Chloride: 99 mmol/L (ref 98–111)
Creatinine, Ser: 0.65 mg/dL (ref 0.44–1.00)
GFR calc Af Amer: >60 mL/min (ref >60)
GFR calc non Af Amer: >60 mL/min (ref >60)
Glucose, Bld: 130 mg/dL — ABNORMAL HIGH (ref 70–99)
Potassium: 3.5 mmol/L (ref 3.5–5.1)
Sodium: 135 mmol/L (ref 135–145)
Total Bilirubin: 0.8 mg/dL (ref 0.3–1.2)
Total Protein: 8.8 g/dL — ABNORMAL HIGH (ref 6.5–8.1)

## 2019-07-26 LAB — TSH: TSH: 1.74 u[IU]/mL (ref 0.350–4.500)

## 2019-07-26 LAB — URINALYSIS, ROUTINE W REFLEX MICROSCOPIC
Bilirubin Urine: NEGATIVE
Glucose, UA: NEGATIVE mg/dL
Ketones, ur: NEGATIVE mg/dL
Nitrite: NEGATIVE
Protein, ur: 30 mg/dL — AB
Specific Gravity, Urine: 1.023 (ref 1.005–1.030)
pH: 5 (ref 5.0–8.0)

## 2019-07-26 MED ORDER — LEVOTHYROXINE SODIUM 50 MCG PO TABS
25.0000 ug | ORAL_TABLET | Freq: Every day | ORAL | Status: DC
  Administered 2019-07-27 – 2019-07-31 (×5): 25 ug via ORAL
  Filled 2019-07-26 (×5): qty 1

## 2019-07-26 MED ORDER — MECLIZINE HCL 12.5 MG PO TABS: 12.5000 mg | ORAL_TABLET | Freq: Two times a day (BID) | ORAL | Status: DC | PRN

## 2019-07-26 MED ORDER — SERTRALINE HCL 50 MG PO TABS
100.0000 mg | ORAL_TABLET | Freq: Every day | ORAL | Status: DC
  Administered 2019-07-26 – 2019-07-31 (×6): 100 mg via ORAL
  Filled 2019-07-26 (×6): qty 2

## 2019-07-26 MED ORDER — METFORMIN HCL 500 MG PO TABS
500.0000 mg | ORAL_TABLET | Freq: Every day | ORAL | Status: DC
  Administered 2019-07-27 – 2019-07-31 (×5): 500 mg via ORAL
  Filled 2019-07-26 (×5): qty 1

## 2019-07-26 MED ORDER — PROPRANOLOL HCL 10 MG PO TABS
10.0000 mg | ORAL_TABLET | Freq: Two times a day (BID) | ORAL | Status: DC
  Administered 2019-07-26 – 2019-07-31 (×11): 10 mg via ORAL
  Filled 2019-07-26 (×11): qty 1

## 2019-07-26 MED ORDER — ATORVASTATIN CALCIUM 40 MG PO TABS
40.0000 mg | ORAL_TABLET | Freq: Every day | ORAL | Status: DC
  Administered 2019-07-26 – 2019-07-30 (×5): 40 mg via ORAL
  Filled 2019-07-26 (×5): qty 1

## 2019-07-26 MED ORDER — VITAMIN D3 25 MCG (1000 UNIT) PO TABS
2000.0000 [IU] | ORAL_TABLET | Freq: Every day | ORAL | Status: DC
  Administered 2019-07-26 – 2019-07-30 (×5): 2000 [IU] via ORAL
  Filled 2019-07-26 (×8): qty 2

## 2019-07-26 MED ORDER — LORATADINE 10 MG PO TABS
10.0000 mg | ORAL_TABLET | Freq: Every day | ORAL | Status: DC
  Administered 2019-07-26 – 2019-07-31 (×6): 10 mg via ORAL
  Filled 2019-07-26 (×6): qty 1

## 2019-07-26 MED ORDER — FESOTERODINE FUMARATE ER 4 MG PO TB24
8.0000 mg | ORAL_TABLET | Freq: Every day | ORAL | Status: DC
  Administered 2019-07-26 – 2019-07-31 (×6): 8 mg via ORAL
  Filled 2019-07-26 (×8): qty 2

## 2019-07-26 MED ORDER — DONEPEZIL HCL 5 MG PO TABS
10.0000 mg | ORAL_TABLET | Freq: Every day | ORAL | Status: DC
  Administered 2019-07-26 – 2019-07-30 (×5): 10 mg via ORAL
  Filled 2019-07-26 (×5): qty 2
  Filled 2019-07-26 (×3): qty 1

## 2019-07-26 MED ORDER — MIRABEGRON ER 25 MG PO TB24
50.0000 mg | ORAL_TABLET | Freq: Every day | ORAL | Status: DC
  Administered 2019-07-27 – 2019-07-30 (×4): 50 mg via ORAL
  Filled 2019-07-26: qty 1
  Filled 2019-07-26: qty 2
  Filled 2019-07-26: qty 1
  Filled 2019-07-26: qty 2
  Filled 2019-07-26 (×3): qty 1

## 2019-07-26 MED ORDER — LEVETIRACETAM 500 MG PO TABS
500.0000 mg | ORAL_TABLET | Freq: Two times a day (BID) | ORAL | Status: DC
  Administered 2019-07-26 – 2019-07-31 (×11): 500 mg via ORAL
  Filled 2019-07-26 (×11): qty 1

## 2019-07-26 NOTE — ED Provider Notes (Signed)
[facility] EMERGENCY DEPARTMENT Provider Note   CSN: [id number] Arrival date & time: 07/26/19  2130     History Chief Complaint  Patient presents with  . Fall    Leah Payne is a 74 y.o. female.  HPI   The pt is a 74 y/o female - living by herself right now since her h usband has been admitted to the hospital - she has been feeling well and decided to take a bath last night (usually showers).  She found that she could not get out of the tub and was stuck in the tub all night - she denies trauma / falling or focal weakness - she is generally chronically weak and had a bar on the side of the tub to ease access in and out - states that she couldn't lift herself out.  A wellness check was done today when a friend could not get ahold of her and medics found her alert and oriented int he tub.  She denies pain, fever, vomiting, CP, SOB or any other c/o - she states she did not fall, has no complaint other than being hungry and thirsty - she was not hypothermic on EMS VS.  The husband is the primary caregiver and he is currently hospitalized.    I have reviewed the pt's medical record - it shows that she has a dx of DM, hydrocephalus and WPW.  She does take medicine for cognitive problems and she does take Keppra.    Past Medical History:  Diagnosis Date  . Anomaly of cranium    frontal bossing, enlarged cranium, adult strabismus  . Diabetes mellitus type 2, controlled (HCC) 08/04/2013  . Heart disease   . History of chicken pox   . History of fainting   . 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 Hyperlipidemia 08/04/2013  . Hyponatremia 08/29/2014  . Preaxial polydactyly of right hand congenital  . Seizure disorder (HCC) 08/04/2013  . Tachycardia   . WPW (Wolff-Parkinson-White syndrome)    has seen Dr Donnie Aho     Patient Active Problem List   Diagnosis Date Noted  . Chronic diarrhea 05/06/2019  . 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 Candidal intertrigo 02/16/2018  . Fall at home, initial encounter 08/01/2017  . Abdominal distension 05/16/2017  . Vitamin D deficiency 03/21/2017  . Medicare annual wellness visit, initial 09/23/2016  . Health maintenance examination 09/23/2016  . Advanced care planning/counseling discussion 09/23/2016  . Dizziness 03/23/2016  . Alzheimer disease (HCC) 12/17/2015  . Imbalance 12/17/2015  . WPW (Wolff-Parkinson-White syndrome)   . Type 2 diabetes, controlled, 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HCC) 09/17/2015  . Hypothyroidism 09/17/2015  . Overactive bladder 08/29/2014  . Seizure disorder (HCC) 08/04/2013  . 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HCC) 08/04/2013  . Hypertension 08/04/2013  . Hyperlipidemia 08/04/2013    Past Surgical History:  Procedure Laterality Date  . CHOLECYSTECTOMY  1978     OB History   No obstetric history on file.     Family History  Problem Relation Age of Onset  . Stroke Mother 70  . Pneumonia Mother   . CAD Father 51  . Alzheimer's disease Father   . Pneumonia Father   . Cancer Maternal Aunt        lung (non-smoker)  . Diabetes Neg Hx     Social History   Tobacco Use  .  Smoking status: Never Smoker  . Smokeless tobacco: Never Used  Substance Use Topics  . Alcohol use: No    Alcohol/week: 0.0 standard drinks  . Drug use: No    Home Medications Prior to Admission medications   Medication Sig Start Date End Date Taking? Authorizing Provider  acetaminophen (TYLENOL) 500 MG tablet Take 1 tablet (500 mg total) by mouth every 8 (eight) hours as needed. 03/01/18  Yes Eustaquio Boyden, MD  Cholecalciferol (VITAMIN D) 50 MCG (2000 UT) CAPS Take 1 capsule (2,000 Units total) by mouth daily. 02/02/19  Yes Eustaquio Boyden, MD  donepezil (ARICEPT) 10 MG tablet TAKE 1 TABLET EVERY DAY Patient taking differently: Take 10 mg by mouth at  bedtime. TAKE 1 TABLET EVERY DAY 02/20/19  Yes Anson Fret, MD  fesoterodine ([name]) 8 MG TB24 tablet TAKE 1 TABLET (8 MG TOTAL) BY MOUTH DAILY. Patient taking differently: Take 8 mg by mouth daily. TAKE 1 TABLET (8 MG TOTAL) BY MOUTH DAILY. 05/02/19  Yes Eustaquio Boyden, MD  levothyroxine (SYNTHROID) 25 MCG tablet TAKE 1 TABLET DAILY BEFORE BREAKFAST. Patient taking differently: Take 25 mcg by mouth daily before breakfast.  12/20/18  Yes Eustaquio Boyden, MD  meclizine (ANTIVERT) 25 MG tablet TAKE 1/2 TO 1 TABLET TWICE DAILY AS NEEDED FOR DIZZINESS Patient taking differently: Take 12.5-25 mg by mouth 2 (two) times daily as needed for dizziness.  10/31/18  Yes Eustaquio Boyden, MD  metFORMIN (GLUCOPHAGE) 500 MG tablet TAKE 1 TABLET EVERY DAY WITH BREAKFAST Patient taking differently: Take 500 mg by mouth daily with breakfast.  03/11/19  Yes Eustaquio Boyden, MD  mirabegron ER ([name]) 50 MG TB24 tablet Take 1 tablet (50 mg total) by mouth at bedtime. 10/15/18  Yes Eustaquio Boyden, MD  propranolol (INDERAL) 10 MG tablet TAKE 1 TABLET TWICE DAILY Patient taking differently: Take 10 mg by mouth 2 (two) times daily.  03/11/19  Yes Eustaquio Boyden, MD  sertraline (ZOLOFT) 100 MG tablet Take 1 tablet (100 mg total) by mouth daily. 05/02/19  Yes Eustaquio Boyden, MD  atorvastatin (LIPITOR) 40 MG tablet TAKE 1 TABLET EVERY DAY Patient taking differently: Take 40 mg by mouth daily at 6 PM. TAKE 1 TABLET EVERY DAY 03/11/19   Eustaquio Boyden, MD  levETIRAcetam (KEPPRA) 500 MG tablet TAKE 1 TABLET TWICE DAILY Patient taking differently: Take 500 mg by mouth 2 (two) times daily.  12/31/18   Eustaquio Boyden, MD  loratadine (CLARITIN) 10 MG tablet Take 1 tablet (10 mg total) by mouth daily. 05/31/19   Eustaquio Boyden, MD    Allergies    Patient has no known allergies.  Review of Systems   Review of Systems  All other systems reviewed and are negative.   Physical Exam Updated Vital Signs BP  (!) 161/91   Pulse 91   Temp 98 F (36.7 C) (Oral)   Resp 16   SpO2 98%   Physical Exam Vitals and nursing note reviewed.  Constitutional:      General: She is not in acute distress.    Appearance: She is well-developed.  HENT:     Head: Normocephalic and atraumatic.     Comments: Frontal bossing, chronic.    Mouth/Throat:     Pharynx: No oropharyngeal exudate.     Comments: Normal MM Eyes:     General: No scleral icterus.       Right eye: No discharge.        Left eye: No discharge.     Conjunctiva/sclera: Conjunctivae normal.  Pupils: Pupils are equal, round, and reactive to light.  Neck:     Thyroid: No thyromegaly.     Vascular: No JVD.  Cardiovascular:     Rate and Rhythm: Normal rate and regular rhythm.     Heart sounds: Normal heart sounds. No murmur. No friction rub. No gallop.   Pulmonary:     Effort: Pulmonary effort is normal. No respiratory distress.     Breath sounds: Normal breath sounds. No wheezing or rales.  Abdominal:     General: Bowel sounds are normal. There is no distension.     Palpations: Abdomen is soft. There is no mass.     Tenderness: There is no abdominal tenderness.  Musculoskeletal:        General: No tenderness. Normal range of motion.     Cervical back: Normal range of motion and neck supple.  Lymphadenopathy:     Cervical: No cervical adenopathy.  Skin:    General: Skin is warm and dry.     Findings: No erythema or rash.  Neurological:     Mental Status: She is alert.     Coordination: Coordination normal.     Comments: The pt is able to move all 4 extremities - normal grips - able to SLR bilaterally - her L knee has some pain (chronically), but not weak though she has some decreased ROM in the knee.  MS is alert and oriented to place, name and Birthdate.  No facial droop or slurred speech.  Psychiatric:        Behavior: Behavior normal.     ED Results / Procedures / Treatments   Labs (all labs ordered are listed, but only  abnormal results are displayed) Labs Reviewed  COMPREHENSIVE METABOLIC PANEL - Abnormal; Notable for the following components:      Result Value   Glucose, Bld 130 (*)    Total Protein 8.8 (*)    AST 52 (*)    All other components within normal limits  CBC WITH DIFFERENTIAL/PLATELET - Abnormal; Notable for the following components:   Eosinophils Absolute 0.6 (*)    All other components within normal limits  URINALYSIS, ROUTINE W REFLEX MICROSCOPIC - Abnormal; Notable for the following components:   APPearance TURBID (*)    Hgb urine dipstick SMALL (*)    Protein, ur 30 (*)    Leukocytes,Ua MODERATE (*)    Bacteria, UA RARE (*)    All other components within normal limits  URINE CULTURE  TSH    EKG EKG Interpretation  Date/Time:  Friday July 26 2019 10:06:41 EST Ventricular Rate:  83 PR Interval:    QRS Duration: 229 QT Interval:  414 QTC Calculation: 487 R Axis:   33 Text Interpretation: Normal sinus rhythm Probable left ventricular hypertrophy Borderline abnrm T, anterolateral leads Borderline prolonged QT interval Artifact in lead(s) I II III aVR aVL aVF since last tracing no significant change Confirmed by Eber Hong ([id number]) on 07/26/2019 10:14:49 AM   Radiology No results found.  Procedures Procedures (including critical care time)  Medications Ordered in ED Medications - No data to display  ED Course  I have reviewed the triage vital signs and the nursing notes.  Pertinent labs & imaging results that were available during my care of the patient were reviewed by me and considered in my medical decision making (see chart for details).  Clinical Course as of Jul 25 1422  Fri Jul 26, 2019  6295 The welfare check was evidently initiated by  the patients spouse from the hospital where he is admitted - looking for other family at this time.   [BM]  [phone] I tried to assist this patient with walking, she cannot even stand up by herself and when she does get to a  standing position she can shuffle a couple inches at a time but really cannot walk.   [BM]  [phone] I discussed the patient's care with her husband who is currently inpatient at [facility].  He has had a stroke and has been told that he will be in the hospital for at least another week after procedures investigating other problems.  He states that his wife has dementia, she always says that she is doing great even when she is not.  He reports that she has a very hard time caring for herself and can only do very simple things.  He states he had no resources to help him get her any help when he went into the hospital but has had a friend checking on her.  The friend saw her yesterday at 2:00 bringing her food and she seemed to be doing okay.  He has given me phone numbers for his brother Annie Main at [phone] and a friend Arvin Collard, [phone].I will consult with transition of care team as well to help with some type of temporary placement   [BM]    Clinical Course User Index [BM] Noemi Chapel, MD   MDM Rules/Calculators/A&P                      I see no signs of trauma, the patient is otherwise well-appearing, the question is whether she is able to function by herself in her environment.  Her husband is currently admitted to the hospital, will need to do some investigation into the stability of her home environment.  Labs have been reviewed, there is no signs of urinary tract infection, she has a normal metabolic panel except for a slight hyperglycemia and normal blood counts.  Her vital signs are reassuring except for mild hypertension.  Her home medications will be reconciled, physical therapy has been requested to help with placement and according to the transition of care team she will likely not be able to be placed until Monday.  I have discussed her care with a Arvin Collard at [phone] who states that he cannot personally take care of her but will also be looking for people that  may be able to help and call back if you find somebody.  At change of shift, care signed out to oncoming emergency department provider to help with disposition when social work is able to help place  Final Clinical Impression(s) / ED Diagnoses Final diagnoses:  Generalized weakness      Noemi Chapel, MD 07/26/19 1424

## 2019-07-26 NOTE — ED Triage Notes (Signed)
Pt reports that she was trying to get out of tub at 6pm, pt has been in tub. Husband in hospital currently which is primary caregiver. Police called for welfare check

## 2019-07-26 NOTE — ED Notes (Signed)
PT in room doing eval at this time.

## 2019-07-26 NOTE — Plan of Care (Signed)
  Problem: Acute Rehab PT Goals(only PT should resolve) Goal: Pt Will Go Supine/Side To Sit Outcome: Progressing Flowsheets (Taken 07/26/2019 1529) Pt will go Supine/Side to Sit: with min guard assist Goal: Pt Will Go Sit To Supine/Side Outcome: Progressing Flowsheets (Taken 07/26/2019 1529) Pt will go Sit to Supine/Side: with min guard assist Goal: Patient Will Transfer Sit To/From Stand Outcome: Progressing Flowsheets (Taken 07/26/2019 1529) Patient will transfer sit to/from stand: with supervision Goal: Pt Will Transfer Bed To Chair/Chair To Bed Outcome: Progressing Flowsheets (Taken 07/26/2019 1529) Pt will Transfer Bed to Chair/Chair to Bed: min guard assist Goal: Pt Will Ambulate Outcome: Progressing Flowsheets (Taken 07/26/2019 1529) Pt will Ambulate:  25 feet  with min guard assist  with least restrictive assistive device  3:30 PM, 07/26/19 Wyman Songster PT, DPT Physical Therapist at [facility]

## 2019-07-26 NOTE — Evaluation (Signed)
Physical Therapy Evaluation Patient Details Name: Leah Payne MRN: [id number] DOB: [birth year] Today's Date: 07/26/2019   History of Present Illness  The pt is a 74 y/o female - living by herself right now since her h usband has been admitted to the hospital - she has been feeling well and decided to take a bath last night (usually showers).  She found that she could not get out of the tub and was stuck in the tub all night - she denies trauma / falling or focal weakness - she is generally chronically weak and had a bar on the side of the tub to ease access in and out - states that she couldn't lift herself out.  A wellness check was done today when a friend could not get ahold of her and medics found her alert and oriented int he tub.  She denies pain, fever, vomiting, CP, SOB or any other c/o - she states she did not fall, has no complaint other than being hungry and thirsty - she was not hypothermic on EMS VS.  The husband is the primary caregiver and he is currently hospitalized.     Clinical Impression  Patient limited for functional mobility as stated below secondary to BLE weakness, fatigue and poor standing balance. She requires mod assist for bed mobility and min assist for transfers and ambulation. She ambulates with very small, shuffled, and unsteady steps with RW along with verbal cueing for safety and advancing RW with her. Patient demonstrates generalized weakness throughout today's session and appears to be at an increased risk of falling.   Patient will benefit from continued physical therapy in hospital and recommended venue below to increase strength, balance, endurance for safe ADLs and gait.     Follow Up Recommendations SNF;Supervision/Assistance - 24 hour;Supervision for mobility/OOB    Equipment Recommendations  None recommended by PT    Recommendations for Other Services       Precautions / Restrictions Precautions Precautions: Fall Restrictions Weight Bearing  Restrictions: No      Mobility  Bed Mobility Overal bed mobility: Needs Assistance Bed Mobility: Supine to Sit;Sit to Supine     Supine to sit: HOB elevated;Mod assist(uses bed rails) Sit to supine: Mod assist   General bed mobility comments: requires assist to transition to seated EOB, uses bed rails, requires assist for LE and to upright trunk  Transfers Overall transfer level: Needs assistance Equipment used: Rolling walker (2 wheeled) Transfers: Sit to/from [employer] Sit to Stand: Min assist Stand pivot transfers: Min assist       General transfer comment: frequent verbal cueing for sequencing and mechanics; cueing for safety and RW use; patient feels weak upon standing  Ambulation/Gait Ambulation/Gait assistance: Min assist Gait Distance (Feet): 8 Feet Assistive device: Rolling walker (2 wheeled) Gait Pattern/deviations: Shuffle;Decreased step length - right;Decreased step length - left;Decreased stride length;Step-through pattern     General Gait Details: slow, small, shuffled steps; requires min assist and use of RW for safety and balance, frequent verbal cueing for advancing RW and standing upright  Stairs            Wheelchair Mobility    Modified Rankin (Stroke Patients Only)       Balance Overall balance assessment: Needs assistance Sitting-balance support: Feet unsupported;Bilateral upper extremity supported Sitting balance-Leahy Scale: Fair Sitting balance - Comments: seated EOB     Standing balance-Leahy Scale: Poor Standing balance comment: requires RW and assit to remain standing  Pertinent Vitals/Pain Pain Assessment: No/denies pain    Home Living Family/patient expects to be discharged to:: Private residence Living Arrangements: Spouse/significant other Available Help at Discharge: Family;Friend(s);Available PRN/intermittently Type of Home: House Home Access: Level entry      Home Layout: One level   Additional Comments: husband currently admitted in hospital so he is unable to care for her until he is discharged    Prior Function Level of Independence: Needs assistance   Gait / Transfers Assistance Needed: limited household ambulator  ADL's / Homemaking Assistance Needed: husband assists        Hand Dominance        Extremity/Trunk Assessment   Upper Extremity Assessment Upper Extremity Assessment: Overall WFL for tasks assessed    Lower Extremity Assessment Lower Extremity Assessment: Generalized weakness    Cervical / Trunk Assessment Cervical / Trunk Assessment: Kyphotic  Communication      Cognition Arousal/Alertness: Awake/alert Behavior During Therapy: WFL for tasks assessed/performed Overall Cognitive Status: Within Functional Limits for tasks assessed                                 General Comments: appears to be slightly confused and possible memory deficits      General Comments      Exercises     Assessment/Plan    PT Assessment Patient needs continued PT services  PT Problem List Decreased strength;Decreased activity tolerance;Decreased balance;Decreased mobility;Decreased coordination;Decreased cognition;Decreased knowledge of use of DME;Decreased safety awareness       PT Treatment Interventions DME instruction;Gait training;Functional mobility training;Therapeutic activities;Therapeutic exercise;Balance training;Neuromuscular re-education;Patient/family education;Manual techniques;Modalities    PT Goals (Current goals can be found in the Care Plan section)  Acute Rehab PT Goals Patient Stated Goal: Get stronger PT Goal Formulation: With patient Time For Goal Achievement: 08/09/19 Potential to Achieve Goals: Good    Frequency Min 2X/week   Barriers to discharge        Co-evaluation               AM-PAC PT "6 Clicks" Mobility  Outcome Measure Help needed turning from your back to  your side while in a flat bed without using bedrails?: A Little Help needed moving from lying on your back to sitting on the side of a flat bed without using bedrails?: A Little Help needed moving to and from a bed to a chair (including a wheelchair)?: A Lot Help needed standing up from a chair using your arms (e.g., wheelchair or bedside chair)?: A Lot Help needed to walk in hospital room?: A Lot Help needed climbing 3-5 steps with a railing? : A Lot 6 Click Score: 14    End of Session Equipment Utilized During Treatment: Gait belt Activity Tolerance: Patient tolerated treatment well Patient left: in bed;with call bell/phone within reach Nurse Communication: Mobility status PT Visit Diagnosis: Unsteadiness on feet (R26.81);Other abnormalities of gait and mobility (R26.89);Muscle weakness (generalized) (M62.81)    Time: 0981-1914 PT Time Calculation (min) (ACUTE ONLY): 27 min   Charges:   PT Evaluation $PT Eval Low Complexity: 1 Low PT Treatments $Therapeutic Activity: 23-37 mins       3:27 PM, 07/26/19 Mearl Latin PT, DPT Physical Therapist at [facility]

## 2019-07-26 NOTE — TOC Initial Note (Signed)
Transition of Care [facility]) - Initial/Assessment Note    Patient Details  Name: Leah Payne MRN: [id number] Date of Birth: 09-01-1945  Transition of Care [facility]) CM/SW Contact:    Leitha Bleak, RN Phone Number: 07/26/2019, 4:47 PM  Clinical Narrative:  Patient is in the ED. Patient has Alzheimer and lives with her husband, Leah Payne. CM spoke with Leah Payne, he is currently a patient at [facility] It is not safe for patient to return home alone.  Police was called out for well check and found her in the bathtub, she had been there several hours unable to get out. Leah Payne states they moved here 4 months ago, no family or friends to care for her.   PT is recommending SNF, Leah Payne is in agreement. He will be inpatient for at least another week. States it would be good for her to get PT and get stronger before returning home.  We discussed rating for facilities. He wants her to stay in [place] if possible.  FL2 done and sent out. Updated ED staff with Holiday weekend it will be Monday before any SNF looks at making a bed offer. TOC to follow.                  Expected Discharge Plan: Skilled Nursing Facility Barriers to Discharge: Continued Medical Work up, No SNF bed   Patient Goals and CMS Choice Patient states their goals for this hospitalization and ongoing recovery are:: to go to SNF then return home. CMS Medicare.gov Compare Post Acute Care list provided to:: Patient Represenative (must comment) Choice offered to / list presented to : Spouse  Expected Discharge Plan and Services Expected Discharge Plan: Skilled Nursing Facility       Living arrangements for the past 2 months: Apartment                  Prior Living Arrangements/Services Living arrangements for the past 2 months: Apartment Lives with:: Spouse   Do you feel safe going back to the place where you live?: Yes      Need for Family Participation in Patient Care: Yes (Comment) Care giver support system in place?:  Yes (comment) Current home services: DME Criminal Activity/Legal Involvement Pertinent to Current Situation/Hospitalization: No - Comment as needed  Activities of Daily Living      Permission Sought/Granted Permission sought to share information with : Case Manager    Share Information with NAME: Leah Payne     Permission granted to share info w Relationship: Husband     Emotional Assessment       Orientation: : Oriented to Self, Oriented to Situation Alcohol / Substance Use: Not Applicable Psych Involvement: No (comment)  Admission diagnosis:  fall Patient Active Problem List   Diagnosis Date Noted  . Chronic diarrhea 05/06/2019  . 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 Candidal intertrigo 02/16/2018  . Fall at home, initial encounter 08/01/2017  . Abdominal distension 05/16/2017  . Vitamin D deficiency 03/21/2017  . Medicare annual wellness visit, initial 09/23/2016  . Health maintenance examination 09/23/2016  . Advanced care planning/counseling discussion 09/23/2016  . Dizziness 03/23/2016  . Alzheimer disease (HCC) 12/17/2015  . Imbalance 12/17/2015  . WPW (Wolff-Parkinson-White syndrome)   . Type 2 diabetes, controlled, 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HCC) 09/17/2015  . Hypothyroidism 09/17/2015  . Overactive bladder 08/29/2014  . Seizure disorder (HCC) 08/04/2013  . 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HCC) 08/04/2013  . Hypertension 08/04/2013  . Hyperlipidemia 08/04/2013   PCP:  Eustaquio Boyden, MD Pharmacy:  [facility] [place], [address] [place] [address] Phone: [phone] Fax: [facility] [place], [place] [address] Phone: [phone] Fax: [phone]    Readmission Risk Interventions No flowsheet data found.

## 2019-07-26 NOTE — Progress Notes (Signed)
CSW received consult regarding patient needing SNF. CSW requested that the EDP put in a PT consult for patient. Will continue to follow up.  Geralyn Corwin, LCSW Transitions of Care Department [facility] ED [phone]

## 2019-07-26 NOTE — ED Provider Notes (Signed)
  Provider Note MRN:  [id number]  Arrival date & time: 07/26/19    ED Course and Medical Decision Making  Assumed care from Dr. Hyacinth Meeker at shift change.  Work-up is unrevealing, PT is recommending SNF, which husband is in agreement with.  Will hold overnight for hopeful placement on Monday.  Signed out to default provider.  Procedures  Final Clinical Impressions(s) / ED Diagnoses     ICD-10-CM   1. Generalized weakness  R53.1     ED Discharge Orders    None      Discharge Instructions   None     Elmer Sow. Pilar Plate, MD [facility] Emergency Medicine [facility] [email or web address]    Sabas Sous, MD 07/26/19 [phone]

## 2019-07-26 NOTE — NC FL2 (Signed)
[place] LEVEL OF CARE SCREENING TOOL     IDENTIFICATION  Patient Name: Leah Payne Birthdate: 1945/10/16 Sex: female Admission Date (Current Location): 07/26/2019  [facility] and [place] Number:  [employer] and Address:  [place] [address] [place]      Provider Number: [id number]  Attending Physician Name and Address:  Maudie Flakes, MD  Relative Name and Phone Number:  Gloria Ricardo  husband  [phone]   in [facility] at present [phone]    Current Level of Care: Hospital Recommended Level of Care: [place] Prior Approval Number:    Date Approved/Denied:   PASRR Number: [id number]  Discharge Plan: SNF    Current Diagnoses: Patient Active Problem List   Diagnosis Date Noted  . Chronic diarrhea 05/06/2019  . 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 Candidal intertrigo 02/16/2018  . Fall at home, initial encounter 08/01/2017  . Abdominal distension 05/16/2017  . Vitamin D deficiency 03/21/2017  . Medicare annual wellness visit, initial 09/23/2016  . Health maintenance examination 09/23/2016  . Advanced care planning/counseling discussion 09/23/2016  . Dizziness 03/23/2016  . Alzheimer disease ([place]) 12/17/2015  . Imbalance 12/17/2015  . WPW (Wolff-Parkinson-White syndrome)   . Type 2 diabetes, controlled, 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[place]) 09/17/2015  . Hypothyroidism 09/17/2015  . Overactive bladder 08/29/2014  . Seizure disorder ([place]) 08/04/2013  . 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[place]) 08/04/2013  . Hypertension 08/04/2013  . Hyperlipidemia 08/04/2013    Orientation RESPIRATION BLADDER Height & Weight     Self, Situation  Normal Incontinent Weight:   Height:     BEHAVIORAL SYMPTOMS/MOOD NEUROLOGICAL BOWEL NUTRITION STATUS      Incontinent Diet  AMBULATORY STATUS COMMUNICATION OF NEEDS Skin   Extensive Assist Verbally Normal                       Personal Care Assistance  Level of Assistance  Bathing, Feeding, Dressing Bathing Assistance: Maximum assistance Feeding assistance: Limited assistance Dressing Assistance: Maximum assistance     Functional Limitations Info  Sight, Hearing, Speech Sight Info: Adequate Hearing Info: Adequate Speech Info: Adequate    SPECIAL CARE FACTORS FREQUENCY  PT (By licensed PT)     PT Frequency: 5 times a week              Contractures Contractures Info: Not present    Additional Factors Info  Code Status, Allergies   Allergies Info: NKDA           Current Medications (07/26/2019):  This is the current hospital active medication list Current Facility-Administered Medications  Medication Dose Route Frequency Provider Last Rate Last Admin  . atorvastatin (LIPITOR) tablet 40 mg  40 mg Oral q1800 Noemi Chapel, MD      . cholecalciferol (VITAMIN D) tablet 2,000 Units  2,000 Units Oral Daily Noemi Chapel, MD   2,000 Units at 07/26/19 1534  . donepezil (ARICEPT) tablet 10 mg  10 mg Oral QHS Noemi Chapel, MD      . fesoterodine [name]) tablet 8 mg  8 mg Oral Daily Noemi Chapel, MD   8 mg at 07/26/19 1535  . levETIRAcetam (KEPPRA) tablet 500 mg  500 mg Oral BID Noemi Chapel, MD   500 mg at 07/26/19 1534  . [START ON 07/27/2019] levothyroxine (SYNTHROID) tablet 25 mcg  25 mcg Oral QAC breakfast Noemi Chapel, MD      . loratadine (CLARITIN) tablet 10 mg  10 mg Oral  Daily Eber Hong, MD   10 mg at 07/26/19 1535  . meclizine (ANTIVERT) tablet 12.5-25 mg  12.5-25 mg Oral BID PRN Eber Hong, MD      . Melene Muller ON 07/27/2019] metFORMIN (GLUCOPHAGE) tablet 500 mg  500 mg Oral Q breakfast Eber Hong, MD      . mirabegron ER [facility]) tablet 50 mg  50 mg Oral QHS Eber Hong, MD      . propranolol (INDERAL) tablet 10 mg  10 mg Oral BID Eber Hong, MD   10 mg at 07/26/19 1534  . sertraline (ZOLOFT) tablet 100 mg  100 mg Oral Daily Eber Hong, MD   100 mg at 07/26/19 1534   Current Outpatient Medications   Medication Sig Dispense Refill  . acetaminophen (TYLENOL) 500 MG tablet Take 1 tablet (500 mg total) by mouth every 8 (eight) hours as needed.  0  . Cholecalciferol (VITAMIN D) 50 MCG (2000 UT) CAPS Take 1 capsule (2,000 Units total) by mouth daily. 30 capsule   . donepezil (ARICEPT) 10 MG tablet TAKE 1 TABLET EVERY DAY (Patient taking differently: Take 10 mg by mouth at bedtime. TAKE 1 TABLET EVERY DAY) 90 tablet 4  . fesoterodine ([name]) 8 MG TB24 tablet TAKE 1 TABLET (8 MG TOTAL) BY MOUTH DAILY. (Patient taking differently: Take 8 mg by mouth daily. TAKE 1 TABLET (8 MG TOTAL) BY MOUTH DAILY.) 90 tablet 1  . levothyroxine (SYNTHROID) 25 MCG tablet TAKE 1 TABLET DAILY BEFORE BREAKFAST. (Patient taking differently: Take 25 mcg by mouth daily before breakfast. ) 90 tablet 1  . meclizine (ANTIVERT) 25 MG tablet TAKE 1/2 TO 1 TABLET TWICE DAILY AS NEEDED FOR DIZZINESS (Patient taking differently: Take 12.5-25 mg by mouth 2 (two) times daily as needed for dizziness. ) 60 tablet 0  . metFORMIN (GLUCOPHAGE) 500 MG tablet TAKE 1 TABLET EVERY DAY WITH BREAKFAST (Patient taking differently: Take 500 mg by mouth daily with breakfast. ) 90 tablet 1  . mirabegron ER ([name]) 50 MG TB24 tablet Take 1 tablet (50 mg total) by mouth at bedtime. 90 tablet 1  . propranolol (INDERAL) 10 MG tablet TAKE 1 TABLET TWICE DAILY (Patient taking differently: Take 10 mg by mouth 2 (two) times daily. ) 180 tablet 1  . sertraline (ZOLOFT) 100 MG tablet Take 1 tablet (100 mg total) by mouth daily. 90 tablet 1  . atorvastatin (LIPITOR) 40 MG tablet TAKE 1 TABLET EVERY DAY (Patient taking differently: Take 40 mg by mouth daily at 6 PM. TAKE 1 TABLET EVERY DAY) 90 tablet 1  . levETIRAcetam (KEPPRA) 500 MG tablet TAKE 1 TABLET TWICE DAILY (Patient taking differently: Take 500 mg by mouth 2 (two) times daily. ) 180 tablet 4  . loratadine (CLARITIN) 10 MG tablet Take 1 tablet (10 mg total) by mouth daily.       Discharge  Medications: Please see discharge summary for a list of discharge medications.  Relevant Imaging Results:  Relevant Lab Results:   Additional Information SS# [id number]  Leitha Bleak, RN

## 2019-07-27 DIAGNOSIS — M6281 Muscle weakness (generalized): Secondary | ICD-10-CM | POA: Diagnosis not present

## 2019-07-27 LAB — URINE CULTURE

## 2019-07-27 LAB — CBG MONITORING, ED: Glucose-Capillary: 111 mg/dL — ABNORMAL HIGH (ref 70–99)

## 2019-07-27 NOTE — ED Notes (Signed)
Pt provided Hospital Bed while awaiting SNF placement.

## 2019-07-28 DIAGNOSIS — M6281 Muscle weakness (generalized): Secondary | ICD-10-CM | POA: Diagnosis not present

## 2019-07-28 NOTE — ED Notes (Signed)
Right tilt

## 2019-07-28 NOTE — ED Notes (Signed)
Pt was given breakfast tray. Pt is asleep at this time.

## 2019-07-28 NOTE — ED Notes (Signed)
Pt assisted to speak with her husband who is inpatient at [facility], room [phone]

## 2019-07-28 NOTE — ED Notes (Signed)
Patient denies pain and is resting comfortably.  

## 2019-07-28 NOTE — ED Notes (Signed)
Left tilt

## 2019-07-29 DIAGNOSIS — M6281 Muscle weakness (generalized): Secondary | ICD-10-CM | POA: Diagnosis not present

## 2019-07-29 LAB — RESPIRATORY PANEL BY RT PCR (FLU A&B, COVID)
Influenza A by PCR: NEGATIVE
Influenza B by PCR: NEGATIVE
SARS Coronavirus 2 by RT PCR: NEGATIVE

## 2019-07-29 NOTE — ED Notes (Signed)
ED Provider at bedside. 

## 2019-07-29 NOTE — ED Notes (Signed)
Assumed care for pt, pt incont of stool and while cleaning pt, H. Crawford, RN noted pressure injury to left buttocks. Pt turned on right side with pillow to back for comfort.  Explained to pt that she will need to lay her sides to keep off her buttocks due to skin break down. Pt verbalized understanding.

## 2019-07-29 NOTE — Progress Notes (Signed)
CSW in contact with patients husband Leah Payne to notify him of bed offer that was accepted by [facility]. CSW informed patients husband that we are now awaiting insurance authorization. Leah Payne was agreeable and gracious for the speedy turn around on this matter .  [name] Sherryle Lis LCSWA Transitions of Care  Clinical Social Worker  Ph: [phone]

## 2019-07-29 NOTE — Progress Notes (Signed)
Physical Therapy Treatment Patient Details Name: Leah Payne MRN: [id number] DOB: 1945/12/15 Today's Date: 07/29/2019    History of Present Illness The pt is a 74 y/o female - living by herself right now since her h usband has been admitted to the hospital - she has been feeling well and decided to take a bath last night (usually showers).  She found that she could not get out of the tub and was stuck in the tub all night - she denies trauma / falling or focal weakness - she is generally chronically weak and had a bar on the side of the tub to ease access in and out - states that she couldn't lift herself out.  A wellness check was done today when a friend could not get ahold of her and medics found her alert and oriented int he tub.  She denies pain, fever, vomiting, CP, SOB or any other c/o - she states she did not fall, has no complaint other than being hungry and thirsty - she was not hypothermic on EMS VS.  The husband is the primary caregiver and he is currently hospitalized.     PT Comments    PT appears to be moving with improved ease as compared with evaluation but is still not safe to be at home by herself.     Follow Up Recommendations  SNF;Supervision/Assistance - 24 hour     Equipment Recommendations  None recommended by PT    Recommendations for Other Services  none     Precautions / Restrictions Precautions Precautions: Fall Restrictions Weight Bearing Restrictions: No    Mobility  Bed Mobility Overal bed mobility: Needs Assistance Bed Mobility: Supine to Sit;Sit to Supine     Supine to sit: Min guard Sit to supine: Supervision      Transfers Overall transfer level: Needs assistance Equipment used: Rolling walker (2 wheeled) Transfers: Sit to/from [employer] Sit to Stand: Min guard Stand pivot transfers: Min guard          Ambulation/Gait Ambulation/Gait assistance: Supervision Gait Distance (Feet): 10 Feet(x2) Assistive  device: Rolling walker (2 wheeled) Gait Pattern/deviations: Shuffle;Decreased step length - right;Decreased step length - left;Decreased stride length;Step-through pattern     General Gait Details: slow, small, shuffled steps; requires min assist and use of RW for safety and balance, frequent verbal cueing for advancing RW and standing upright         Cognition Arousal/Alertness: Awake/alert Behavior During Therapy: WFL for tasks assessed/performed Overall Cognitive Status: Within Functional Limits for tasks assessed                                 General Comments: appears to be slightly confused and possible memory deficits      Exercises General Exercises - Lower Extremity Ankle Circles/Pumps: Both;10 reps Quad Sets: Both;10 reps Gluteal Sets: Both;10 reps Long Arc Quad: Both;10 reps Heel Slides: Both;10 reps Hip ABduction/ADduction: Both;10 reps Mini-Sqauts: 5 reps    General Comments        Pertinent Vitals/Pain Pain Assessment: No/denies pain    Home Living    Lives with husband who is currently in the hospital                   Prior Function   Pt states that she ambulated without an assistive device.         PT Goals (current goals can now be found  in the care plan section) Acute Rehab PT Goals Patient Stated Goal: Get stronger PT Goal Formulation: With patient Time For Goal Achievement: 08/09/19 Potential to Achieve Goals: Good Progress towards PT goals: Progressing toward goals    Frequency    Min 2X/week      PT Plan Current plan remains appropriate    Co-evaluation              AM-PAC PT "6 Clicks" Mobility   Outcome Measure                   End of Session Equipment Utilized During Treatment: Gait belt Activity Tolerance: Patient tolerated treatment well Patient left: in bed;with call bell/phone within reach Nurse Communication: Mobility status PT Visit Diagnosis: Unsteadiness on feet (R26.81);Other  abnormalities of gait and mobility (R26.89);Muscle weakness (generalized) (M62.81)     Time: 7591-6384 PT Time Calculation (min) (ACUTE ONLY): 38 min  Charges:  $Gait Training: 8-22 mins $Therapeutic Activity: 8-22 mins                     Rayetta Humphrey, PT CLT [phone] 07/29/2019, 9:47 AM

## 2019-07-29 NOTE — ED Notes (Signed)
Pt given dinner tray.

## 2019-07-29 NOTE — ED Notes (Signed)
Pt given lunch tray.

## 2019-07-29 NOTE — Discharge Instructions (Signed)
You were seen in the emergency department today with generalized weakness.  Your lab work was unremarkable.  Your being discharged to a nursing facility who can assist you further.  Please call to schedule a follow-up with your primary care physician and return to the emergency department with any new or suddenly worsening symptoms.

## 2019-07-29 NOTE — ED Provider Notes (Addendum)
Blood pressure 133/88, pulse 78, temperature 98 F (36.7 C), temperature source Oral, resp. rate 12, SpO2 98 %.  In short, Leah Payne is a 74 y.o. female with a chief complaint of Fall .  Refer to the original H&P for additional details.  Spoke with Social Work. Patient has been accepted to a SNF. They are requesting a rapid COVID test and patient can be discharged. Patient is awake and alert here with normal vitals.   SW called back and we are still waiting on prior authorization for patient's insurance. Will continue to hold in the ED pending that process.     Maia Plan, MD 07/29/19 1640    Maia Plan, MD 07/29/19 [name]

## 2019-07-29 NOTE — Progress Notes (Signed)
Pt recently accepted at [facility] and is now awaiting insurance Authorization.   Deedee Lybarger Sherryle Lis LCSWA Transitions of Care  Clinical Social Worker  Ph: [phone]

## 2019-07-29 NOTE — Progress Notes (Signed)
CSW in contact with [name], admissions coordinator from [facility]. Carollee Herter is currently reviewing the referral and states that that she will be in contact with me after reviewing paper work. TOC team will continue to follow patient for any discharge related needs  Mindel Friscia Tomma Rakers Transitions of Care  Clinical Social Worker  Ph: [phone]

## 2019-07-30 DIAGNOSIS — M6281 Muscle weakness (generalized): Secondary | ICD-10-CM | POA: Diagnosis not present

## 2019-07-30 NOTE — TOC Progression Note (Signed)
Transition of Care [facility]) - Progression Note    Patient Details  Name: Leah Payne MRN: [id number] Date of Birth: 1945/09/07  Transition of Care [facility]) CM/SW Contact  Neeta Storey Sherryle Lis, LCSW Phone Number: 07/30/2019, 5:19 PM  Clinical Narrative:   CSW facilitated conversation in which [name] granted patients other contact [name] permission to fill out any necessary paperwork concerning admission at [facility].   [name] reports that he is unable to present to [facility] to complete paper work until tomorrow on 07/30/2018. CSW informed [name] at [facility] of this matter.   Patient will be discharging to [facility] on Tomorrow under his medicaid. [name] has been made aware of future transfer. CSW will continue to follow patient for future discharge related needs  [name] Transitions of Care  Clinical Social Worker  Ph: [phone]    Expected Discharge Plan: Skilled Nursing Facility Barriers to Discharge: SNF Pending discharge summary  Expected Discharge Plan and Services Expected Discharge Plan: Skilled Nursing Facility       Living arrangements for the past 2 months: Apartment                                       Social Determinants of Health (SDOH) Interventions    Readmission Risk Interventions No flowsheet data found.

## 2019-07-30 NOTE — ED Notes (Signed)
Patient cleaned of bm. New sheets, gown and replaced purewick.  Patient cooperative.

## 2019-07-30 NOTE — Progress Notes (Signed)
Physical Therapy Treatment Patient Details Name: Leah Payne MRN: [id number] DOB: [birth year] Today's Date: 07/30/2019    History of Present Illness   The pt is a 74 y/o female - living by herself right now since her h usband has been admitted to the hospital - she has been feeling well and decided to take a bath last night (usually showers).  She found that she could not get out of the tub and was stuck in the tub all night - she denies trauma / falling or focal weakness - she is generally chronically weak and had a bar on the side of the tub to ease access in and out - states that she couldn't lift herself out.  A wellness check was done today when a friend could not get ahold of her and medics found her alert and oriented int he tub.  She denies pain, fever, vomiting, CP, SOB or any other c/o - she states she did not fall, has no complaint other than being hungry and thirsty - she was not hypothermic on EMS VS.  The husband is the primary caregiver and he is currently hospitalized.     PT Comments    Pt supine in bed and willing to participate with therapy.  Pt with decreased awareness of bowel movement in bed, RN aware and therapist assisted with bed mobility for cleansing.  Min A required with bed mobility, mainly cueing for mechanics to assist with rolling and sitting.  Min guard with transfer to standing and during gait training.  Mainly cueing to standing within walker and increase distance with steps to reduce shuffled gait.  Seated exercises complete for LE strengthening.  EOS pt left in bed with call bell within reach and RN aware of status.               Mobility    07/30/19 0700  Precautions  Precautions Fall  Restrictions  Weight Bearing Restrictions No  Cognition  Overall Cognitive Status Within Functional Limits for tasks assessed  Gait  Gait Assistance Minimal Assistance - Patient > 75%  Gait Distance (Feet) 15 Feet  Assistive device Rolling walker  Gait Pattern  Impaired (shuffled gait, decrease stride length and stance phase)  Gait velocity decreased  General Exercises - Lower Extremity  Ankle Circles/Pumps Both;10 reps;Seated  Long [employer] Both;10 reps;Seated  Hip ABduction/ADduction Both;10 reps;Seated  Hip Flexion/Marching Both;10 reps;Seated   Bed Mobility                            Exercises General Exercises - Lower Extremity Ankle Circles/Pumps: Both;10 reps;Seated Long Arc Quad: Both;10 reps;Seated Hip ABduction/ADduction: Both;10 reps Hip Flexion/Marching: Both;10 reps;Seated    General Comments        Pertinent Vitals/Pain      Home Living                      Prior Function            PT Goals (current goals can now be found in the care plan section)      Frequency     2x/ week      PT Plan  Continue current PT POC    Co-evaluation              AM-PAC PT "6 Clicks" Mobility   Outcome Measure  End of Session    Pt left in bed with call bell within reach and RN aware of status            Time:1445-1518    Charges:    TA x 33 min                   [address], LPTA; [place]   Aldona Lento 07/30/2019, 4:34 PM

## 2019-07-30 NOTE — ED Notes (Signed)
Pt called out because she had 1 episode of emesis. Pt clean up and linens changed.

## 2019-07-31 DIAGNOSIS — M6281 Muscle weakness (generalized): Secondary | ICD-10-CM | POA: Diagnosis not present

## 2019-07-31 DIAGNOSIS — I10 Essential (primary) hypertension: Secondary | ICD-10-CM | POA: Diagnosis not present

## 2019-07-31 DIAGNOSIS — Z7401 Bed confinement status: Secondary | ICD-10-CM | POA: Diagnosis not present

## 2019-07-31 DIAGNOSIS — R531 Weakness: Secondary | ICD-10-CM | POA: Diagnosis not present

## 2019-07-31 DIAGNOSIS — L8990 Pressure ulcer of unspecified site, unspecified stage: Secondary | ICD-10-CM | POA: Insufficient documentation

## 2019-07-31 NOTE — ED Notes (Signed)
Up in chair

## 2019-07-31 NOTE — Progress Notes (Signed)
Physical Therapy Treatment Patient Details Name: Leah Payne MRN: [id number] DOB: 10-Mar-1946 Today's Date: 07/31/2019    History of Present Illness The pt is a 74 y/o female - living by herself right now since her h usband has been admitted to the hospital - she has been feeling well and decided to take a bath last night (usually showers).  She found that she could not get out of the tub and was stuck in the tub all night - she denies trauma / falling or focal weakness - she is generally chronically weak and had a bar on the side of the tub to ease access in and out - states that she couldn't lift herself out.  A wellness check was done today when a friend could not get ahold of her and medics found her alert and oriented int he tub.  She denies pain, fever, vomiting, CP, SOB or any other c/o - she states she did not fall, has no complaint other than being hungry and thirsty - she was not hypothermic on EMS VS.  The husband is the primary caregiver and he is currently hospitalized.     PT Comments    Patient presents seated in chair (assisted by nursing staff) and agreeable for therapy.  Patient requires repeated verbal cues for completing BLE ROM/strenghening exercises while seated in chair, demonstrates very slow labored cadence with short unsteady shuffling like steps, limited secondary to fatigue and poor standing balance.  Patient put back to bed after therapy.  Patient will benefit from continued physical therapy in hospital and recommended venue below to increase strength, balance, endurance for safe ADLs and gait.    Follow Up Recommendations  SNF;Supervision - Intermittent;Supervision for mobility/OOB     Equipment Recommendations  None recommended by PT    Recommendations for Other Services       Precautions / Restrictions Precautions Precautions: Fall Restrictions Weight Bearing Restrictions: No    Mobility  Bed Mobility Overal bed mobility: Needs Assistance Bed  Mobility: Supine to Sit;Sit to Supine     Supine to sit: Min guard Sit to supine: Min assist   General bed mobility comments: increased time, labored movement  Transfers Overall transfer level: Needs assistance Equipment used: Rolling walker (2 wheeled) Transfers: Sit to/from [employer] Sit to Stand: Min assist Stand pivot transfers: Min assist       General transfer comment: slow labored movement  Ambulation/Gait Ambulation/Gait assistance: Min assist;Mod assist Gait Distance (Feet): 15 Feet Assistive device: Rolling walker (2 wheeled) Gait Pattern/deviations: Decreased step length - right;Decreased step length - left;Decreased stride length;Wide base of support Gait velocity: decreased   General Gait Details: slow labored small shufflling like steps   Stairs             Wheelchair Mobility    Modified Rankin (Stroke Patients Only)       Balance Overall balance assessment: Needs assistance Sitting-balance support: No upper extremity supported;Feet supported Sitting balance-Leahy Scale: Fair Sitting balance - Comments: seated EOB   Standing balance support: Bilateral upper extremity supported;During functional activity Standing balance-Leahy Scale: Poor Standing balance comment: fair/poor using RW                            Cognition Arousal/Alertness: Awake/alert Behavior During Therapy: WFL for tasks assessed/performed Overall Cognitive Status: Within Functional Limits for tasks assessed  Exercises General Exercises - Lower Extremity Long Arc Quad: AROM;Strengthening;Both;10 reps;Seated Hip Flexion/Marching: AROM;Strengthening;Both;10 reps;Seated Toe Raises: AROM;Strengthening;Both;10 reps;Seated Heel Raises: AROM;Strengthening;Both;10 reps;Seated    General Comments        Pertinent Vitals/Pain Pain Assessment: No/denies pain    Home Living                       Prior Function            PT Goals (current goals can now be found in the care plan section) Acute Rehab PT Goals PT Goal Formulation: With patient Time For Goal Achievement: 08/09/19 Potential to Achieve Goals: Good Progress towards PT goals: Progressing toward goals    Frequency    Min 2X/week      PT Plan Current plan remains appropriate    Co-evaluation              AM-PAC PT "6 Clicks" Mobility   Outcome Measure  Help needed turning from your back to your side while in a flat bed without using bedrails?: A Little Help needed moving from lying on your back to sitting on the side of a flat bed without using bedrails?: A Little Help needed moving to and from a bed to a chair (including a wheelchair)?: A Little Help needed standing up from a chair using your arms (e.g., wheelchair or bedside chair)?: A Little Help needed to walk in hospital room?: A Lot Help needed climbing 3-5 steps with a railing? : Total 6 Click Score: 15    End of Session   Activity Tolerance: Patient tolerated treatment well;Patient limited by fatigue Patient left: in bed;with call bell/phone within reach Nurse Communication: Mobility status PT Visit Diagnosis: Unsteadiness on feet (R26.81);Other abnormalities of gait and mobility (R26.89);Muscle weakness (generalized) (M62.81)     Time: 2119-4174 PT Time Calculation (min) (ACUTE ONLY): 23 min  Charges:  $Therapeutic Exercise: 8-22 mins $Therapeutic Activity: 8-22 mins                     3:56 PM, 07/31/19 Lonell Grandchild, MPT Physical Therapist with [facility] [phone] office [phone] mobile phone

## 2019-07-31 NOTE — ED Notes (Signed)
No answer at phone number for [facility] #[phone]

## 2019-07-31 NOTE — ED Notes (Signed)
Spoke with Child psychotherapist and they are just waiting for pt's family member to get to [facility] to fill out paper work and she will notify us when that is done.

## 2019-07-31 NOTE — TOC Transition Note (Signed)
Transition of Care [facility]) - CM/SW Discharge Note   Patient Details  Name: MARNI FRANZONI MRN: [id number] Date of Birth: 1945/12/25  Transition of Care [facility]) CM/SW Contact:  Ariyel Jeangilles Sherryle Lis, LCSW Phone Number: 07/31/2019, 2:44 PM   Clinical Narrative:  Pt in contact with [name], admission coordinator at [facility]. Carollee Herter reports that the friend of the patient is currently at [facility] completing admission paper work.   CSW completed medical transport necessity form. Family made aware of transfer and discharge. CSW will continue to follow patient for future discharge related needs.  Lyvia Mondesir Sherryle Lis LCSWA Transitions of Care  Clinical Social Worker  Ph: [phone]      Final next level of care: Skilled Nursing Facility Barriers to Discharge: SNF Pending discharge summary   Patient Goals and CMS Choice Patient states their goals for this hospitalization and ongoing recovery are:: to discharge to SNF for short term rehab and back home with husband when able CMS Medicare.gov Compare Post Acute Care list provided to:: Patient Represenative (must comment) Choice offered to / list presented to : Spouse  Discharge Placement              Patient [facility] bed at: [facility] Patient to be transferred to facility by: [facility] Name of family member notified: Jayma Volpi [facility]: [phone] Patient and family notified of of transfer: 07/31/19  Discharge Plan and Services                                     Social Determinants of Health (SDOH) Interventions     Readmission Risk Interventions No flowsheet data found.

## 2019-07-31 NOTE — ED Notes (Signed)
ED TO INPATIENT HANDOFF REPORT  ED Nurse Name and Phone #: Mickel Schreur [phone]  S Name/Age/Gender Leah Payne 74 y.o. female Room/Bed: APA06/APA06  Code Status   Code Status: Prior  Home/SNF/Other Home Patient oriented to: self, place and situation Is this baseline? Yes   Triage Complete: Triage complete  Chief Complaint fall  Triage Note Pt reports that she was trying to get out of tub at 6pm, pt has been in tub. Husband in hospital currently which is primary caregiver. Police called for welfare check    Allergies No Known Allergies  Level of Care/Admitting Diagnosis ED Disposition    ED Disposition Condition Comment   Discharge  The patient appears reasonably screened and/or stabilized for discharge and I doubt any other medical condition or other [facility] requiring further screening, evaluation, or treatment in the ED exists or is present at this time prior to discharge.       B Medical/Surgery History Past Medical History:  Diagnosis Date  . Anomaly of cranium    frontal bossing, enlarged cranium, adult strabismus  . Diabetes mellitus type 2, controlled (HCC) 08/04/2013  . Heart disease   . History of chicken pox   . History of fainting   . 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 Hyperlipidemia 08/04/2013  . Hyponatremia 08/29/2014  . Preaxial polydactyly of right hand congenital  . Seizure disorder (HCC) 08/04/2013  . Tachycardia   . WPW (Wolff-Parkinson-White syndrome)    has seen Dr Donnie Aho   Past Surgical History:  Procedure Laterality Date  . CHOLECYSTECTOMY  1978     A IV Location/Drains/Wounds Patient Lines/Drains/Airways Status   Active Line/Drains/Airways    Name:   Placement date:   Placement time:   Site:   Days:   Peripheral IV 07/26/19 Right Antecubital   07/26/19     1006    Antecubital   5   Pressure Injury 07/29/19 Buttocks Left Stage 2 -  Partial thickness loss of dermis presenting as a shallow open injury with a red, pink wound bed without slough.   07/29/19    2201     2          Intake/Output Last 24 hours No intake or output data in the 24 hours ending 07/31/19 1722  Labs/Imaging No results found for this or any previous visit (from the past 48 hour(s)). No results found.  Pending Labs Unresulted Labs (From admission, onward)   None      Vitals/Pain Today's Vitals   07/31/19 1215 07/31/19 1230 07/31/19 1300 07/31/19 1400  BP:  140/84 129/87 121/85  Pulse: 87 94 84   Resp:      Temp:      TempSrc:      SpO2: 95% 94% 93%   PainSc:        Isolation Precautions No active isolations  Medications Medications  atorvastatin (LIPITOR) tablet 40 mg (40 mg Oral Given 07/30/19 1831)  cholecalciferol (VITAMIN D) tablet 2,000 Units (2,000 Units Oral Given 07/30/19 1011)  donepezil (ARICEPT) tablet 10 mg (10 mg Oral Given 07/30/19 2223)  fesoterodine ([name]) tablet 8 mg (8 mg Oral Given 07/31/19 0919)  levETIRAcetam (KEPPRA) tablet 500 mg (500 mg Oral Given 07/31/19 0920)  levothyroxine (SYNTHROID) tablet 25 mcg (25 mcg Oral Given 07/31/19 0920)  loratadine (CLARITIN) tablet 10 mg (10 mg Oral  Given 07/31/19 0920)  meclizine (ANTIVERT) tablet 12.5-25 mg (has no administration in time range)  metFORMIN (GLUCOPHAGE) tablet 500 mg (500 mg Oral Given 07/31/19 0919)  propranolol (INDERAL) tablet 10 mg (10 mg Oral Given 07/31/19 0920)  sertraline (ZOLOFT) tablet 100 mg (100 mg Oral Given 07/31/19 0920)  mirabegron ER ([name]) tablet 50 mg (50 mg Oral Given 07/30/19 2224)    Mobility walks with device High fall risk   Focused Assessments    R Recommendations: See Admitting Provider Note  Report given to:   Additional Notes:

## 2019-07-31 NOTE — ED Notes (Signed)
Pt remains in chair at bedside, lunch tray set up and pt eating and watching tv. Denies complaints at this time

## 2019-07-31 NOTE — ED Notes (Signed)
Back to bed per PT

## 2019-08-01 ENCOUNTER — Telehealth: Payer: Self-pay | Admitting: Neurology

## 2019-08-01 DIAGNOSIS — M6281 Muscle weakness (generalized): Secondary | ICD-10-CM | POA: Diagnosis not present

## 2019-08-01 DIAGNOSIS — E039 Hypothyroidism, unspecified: Secondary | ICD-10-CM | POA: Diagnosis not present

## 2019-08-01 DIAGNOSIS — R569 Unspecified convulsions: Secondary | ICD-10-CM | POA: Diagnosis not present

## 2019-08-01 DIAGNOSIS — F0390 Unspecified dementia without behavioral disturbance: Secondary | ICD-10-CM | POA: Diagnosis not present

## 2019-08-01 DIAGNOSIS — E119 Type 2 diabetes mellitus without complications: Secondary | ICD-10-CM | POA: Diagnosis not present

## 2019-08-01 DIAGNOSIS — I456 Pre-excitation syndrome: Secondary | ICD-10-CM | POA: Diagnosis not present

## 2019-08-01 DIAGNOSIS — R627 Adult failure to thrive: Secondary | ICD-10-CM | POA: Diagnosis not present

## 2019-08-01 DIAGNOSIS — R41841 Cognitive communication deficit: Secondary | ICD-10-CM | POA: Diagnosis not present

## 2019-08-01 DIAGNOSIS — I10 Essential (primary) hypertension: Secondary | ICD-10-CM | POA: Diagnosis not present

## 2019-08-01 NOTE — Telephone Encounter (Signed)
I called the pt and LVM advising d/t COVID and schedule adjustment per Dr. Lucia Gaskins we are going to r/s the patients appt to next available with Amy NP. Next Wednesday's appt will be canceled. Left office number for call back to r/s.

## 2019-08-01 NOTE — Telephone Encounter (Signed)
Please reschedule patient's Wednesday 13th11am appointment to Amy please, first available. thanks

## 2019-08-02 DIAGNOSIS — R41841 Cognitive communication deficit: Secondary | ICD-10-CM | POA: Diagnosis not present

## 2019-08-02 DIAGNOSIS — M6281 Muscle weakness (generalized): Secondary | ICD-10-CM | POA: Diagnosis not present

## 2019-08-03 DIAGNOSIS — M6281 Muscle weakness (generalized): Secondary | ICD-10-CM | POA: Diagnosis not present

## 2019-08-03 DIAGNOSIS — R41841 Cognitive communication deficit: Secondary | ICD-10-CM | POA: Diagnosis not present

## 2019-08-05 ENCOUNTER — Ambulatory Visit: Payer: Medicare HMO | Admitting: Family Medicine

## 2019-08-05 DIAGNOSIS — R41841 Cognitive communication deficit: Secondary | ICD-10-CM | POA: Diagnosis not present

## 2019-08-05 DIAGNOSIS — M6281 Muscle weakness (generalized): Secondary | ICD-10-CM | POA: Diagnosis not present

## 2019-08-06 DIAGNOSIS — R41841 Cognitive communication deficit: Secondary | ICD-10-CM | POA: Diagnosis not present

## 2019-08-06 DIAGNOSIS — M6281 Muscle weakness (generalized): Secondary | ICD-10-CM | POA: Diagnosis not present

## 2019-08-07 ENCOUNTER — Ambulatory Visit: Payer: Self-pay | Admitting: Neurology

## 2019-08-07 DIAGNOSIS — E559 Vitamin D deficiency, unspecified: Secondary | ICD-10-CM | POA: Diagnosis not present

## 2019-08-07 DIAGNOSIS — E785 Hyperlipidemia, unspecified: Secondary | ICD-10-CM | POA: Diagnosis not present

## 2019-08-07 DIAGNOSIS — D518 Other vitamin B12 deficiency anemias: Secondary | ICD-10-CM | POA: Diagnosis not present

## 2019-08-07 DIAGNOSIS — E039 Hypothyroidism, unspecified: Secondary | ICD-10-CM | POA: Diagnosis not present

## 2019-08-07 DIAGNOSIS — E119 Type 2 diabetes mellitus without complications: Secondary | ICD-10-CM | POA: Diagnosis not present

## 2019-08-07 DIAGNOSIS — M6281 Muscle weakness (generalized): Secondary | ICD-10-CM | POA: Diagnosis not present

## 2019-08-07 DIAGNOSIS — E7800 Pure hypercholesterolemia, unspecified: Secondary | ICD-10-CM | POA: Diagnosis not present

## 2019-08-07 DIAGNOSIS — Z79899 Other long term (current) drug therapy: Secondary | ICD-10-CM | POA: Diagnosis not present

## 2019-08-07 DIAGNOSIS — R41841 Cognitive communication deficit: Secondary | ICD-10-CM | POA: Diagnosis not present

## 2019-08-07 DIAGNOSIS — D649 Anemia, unspecified: Secondary | ICD-10-CM | POA: Diagnosis not present

## 2019-08-07 DIAGNOSIS — E782 Mixed hyperlipidemia: Secondary | ICD-10-CM | POA: Diagnosis not present

## 2019-08-07 LAB — HEMOGLOBIN A1C: Hemoglobin A1C: 6.2

## 2019-08-07 LAB — VITAMIN D 25 HYDROXY (VIT D DEFICIENCY, FRACTURES): Vit D, 25-Hydroxy: 57.6

## 2019-08-07 LAB — HEPATIC FUNCTION PANEL
ALT: 18 (ref 7–35)
AST: 18 (ref 13–35)
Alkaline Phosphatase: 96 (ref 25–125)
Bilirubin, Total: 0.4

## 2019-08-07 LAB — CBC AND DIFFERENTIAL
Hemoglobin: 14.3 (ref 12.0–16.0)
Platelets: 307 (ref 150–399)
WBC: 5.4

## 2019-08-07 LAB — BASIC METABOLIC PANEL
Creatinine: 0.7 (ref 0.5–1.1)
Glucose: 163
Potassium: 4.2 (ref 3.4–5.3)

## 2019-08-07 LAB — TSH: TSH: 3.24 (ref 0.41–5.90)

## 2019-08-07 LAB — VITAMIN B12: Vitamin B-12: 470

## 2019-08-07 LAB — COMPREHENSIVE METABOLIC PANEL: Calcium: 10 (ref 8.7–10.7)

## 2019-08-08 DIAGNOSIS — M6281 Muscle weakness (generalized): Secondary | ICD-10-CM | POA: Diagnosis not present

## 2019-08-08 DIAGNOSIS — R41841 Cognitive communication deficit: Secondary | ICD-10-CM | POA: Diagnosis not present

## 2019-08-09 DIAGNOSIS — R41841 Cognitive communication deficit: Secondary | ICD-10-CM | POA: Diagnosis not present

## 2019-08-09 DIAGNOSIS — M6281 Muscle weakness (generalized): Secondary | ICD-10-CM | POA: Diagnosis not present

## 2019-08-11 DIAGNOSIS — R41841 Cognitive communication deficit: Secondary | ICD-10-CM | POA: Diagnosis not present

## 2019-08-11 DIAGNOSIS — Z20822 Contact with and (suspected) exposure to covid-19: Secondary | ICD-10-CM | POA: Diagnosis not present

## 2019-08-11 DIAGNOSIS — Z1152 Encounter for screening for COVID-19: Secondary | ICD-10-CM | POA: Diagnosis not present

## 2019-08-11 DIAGNOSIS — M6281 Muscle weakness (generalized): Secondary | ICD-10-CM | POA: Diagnosis not present

## 2019-08-12 DIAGNOSIS — M6281 Muscle weakness (generalized): Secondary | ICD-10-CM | POA: Diagnosis not present

## 2019-08-12 DIAGNOSIS — R41841 Cognitive communication deficit: Secondary | ICD-10-CM | POA: Diagnosis not present

## 2019-08-13 DIAGNOSIS — R41841 Cognitive communication deficit: Secondary | ICD-10-CM | POA: Diagnosis not present

## 2019-08-13 DIAGNOSIS — M6281 Muscle weakness (generalized): Secondary | ICD-10-CM | POA: Diagnosis not present

## 2019-08-14 DIAGNOSIS — R41841 Cognitive communication deficit: Secondary | ICD-10-CM | POA: Diagnosis not present

## 2019-08-14 DIAGNOSIS — M6281 Muscle weakness (generalized): Secondary | ICD-10-CM | POA: Diagnosis not present

## 2019-08-15 DIAGNOSIS — M6281 Muscle weakness (generalized): Secondary | ICD-10-CM | POA: Diagnosis not present

## 2019-08-15 DIAGNOSIS — R41841 Cognitive communication deficit: Secondary | ICD-10-CM | POA: Diagnosis not present

## 2019-08-16 DIAGNOSIS — R41841 Cognitive communication deficit: Secondary | ICD-10-CM | POA: Diagnosis not present

## 2019-08-16 DIAGNOSIS — M6281 Muscle weakness (generalized): Secondary | ICD-10-CM | POA: Diagnosis not present

## 2019-08-18 DIAGNOSIS — R41841 Cognitive communication deficit: Secondary | ICD-10-CM | POA: Diagnosis not present

## 2019-08-18 DIAGNOSIS — M6281 Muscle weakness (generalized): Secondary | ICD-10-CM | POA: Diagnosis not present

## 2019-08-19 DIAGNOSIS — R41841 Cognitive communication deficit: Secondary | ICD-10-CM | POA: Diagnosis not present

## 2019-08-19 DIAGNOSIS — Z1152 Encounter for screening for COVID-19: Secondary | ICD-10-CM | POA: Diagnosis not present

## 2019-08-19 DIAGNOSIS — Z20822 Contact with and (suspected) exposure to covid-19: Secondary | ICD-10-CM | POA: Diagnosis not present

## 2019-08-19 DIAGNOSIS — M6281 Muscle weakness (generalized): Secondary | ICD-10-CM | POA: Diagnosis not present

## 2019-08-20 DIAGNOSIS — M6281 Muscle weakness (generalized): Secondary | ICD-10-CM | POA: Diagnosis not present

## 2019-08-20 DIAGNOSIS — R41841 Cognitive communication deficit: Secondary | ICD-10-CM | POA: Diagnosis not present

## 2019-08-21 DIAGNOSIS — M6281 Muscle weakness (generalized): Secondary | ICD-10-CM | POA: Diagnosis not present

## 2019-08-21 DIAGNOSIS — R41841 Cognitive communication deficit: Secondary | ICD-10-CM | POA: Diagnosis not present

## 2019-08-22 DIAGNOSIS — R41841 Cognitive communication deficit: Secondary | ICD-10-CM | POA: Diagnosis not present

## 2019-08-22 DIAGNOSIS — M6281 Muscle weakness (generalized): Secondary | ICD-10-CM | POA: Diagnosis not present

## 2019-08-23 DIAGNOSIS — R41841 Cognitive communication deficit: Secondary | ICD-10-CM | POA: Diagnosis not present

## 2019-08-23 DIAGNOSIS — M6281 Muscle weakness (generalized): Secondary | ICD-10-CM | POA: Diagnosis not present

## 2019-08-24 DIAGNOSIS — R41841 Cognitive communication deficit: Secondary | ICD-10-CM | POA: Diagnosis not present

## 2019-08-24 DIAGNOSIS — M6281 Muscle weakness (generalized): Secondary | ICD-10-CM | POA: Diagnosis not present

## 2019-08-26 DIAGNOSIS — Z20822 Contact with and (suspected) exposure to covid-19: Secondary | ICD-10-CM | POA: Diagnosis not present

## 2019-08-26 DIAGNOSIS — R41841 Cognitive communication deficit: Secondary | ICD-10-CM | POA: Diagnosis not present

## 2019-08-26 DIAGNOSIS — M6281 Muscle weakness (generalized): Secondary | ICD-10-CM | POA: Diagnosis not present

## 2019-08-26 DIAGNOSIS — Z1152 Encounter for screening for COVID-19: Secondary | ICD-10-CM | POA: Diagnosis not present

## 2019-08-27 DIAGNOSIS — R41841 Cognitive communication deficit: Secondary | ICD-10-CM | POA: Diagnosis not present

## 2019-08-27 DIAGNOSIS — M6281 Muscle weakness (generalized): Secondary | ICD-10-CM | POA: Diagnosis not present

## 2019-08-28 DIAGNOSIS — M6281 Muscle weakness (generalized): Secondary | ICD-10-CM | POA: Diagnosis not present

## 2019-08-28 DIAGNOSIS — R41841 Cognitive communication deficit: Secondary | ICD-10-CM | POA: Diagnosis not present

## 2019-08-30 ENCOUNTER — Ambulatory Visit: Payer: Medicare HMO | Admitting: Family Medicine

## 2019-09-03 ENCOUNTER — Telehealth: Payer: Self-pay | Admitting: Family Medicine

## 2019-09-03 DIAGNOSIS — G301 Alzheimer's disease with late onset: Secondary | ICD-10-CM

## 2019-09-03 DIAGNOSIS — G40909 Epilepsy, unspecified, not intractable, without status epilepticus: Secondary | ICD-10-CM

## 2019-09-03 DIAGNOSIS — R2689 Other abnormalities of gait and mobility: Secondary | ICD-10-CM

## 2019-09-03 DIAGNOSIS — W19XXXA Unspecified fall, initial encounter: Secondary | ICD-10-CM

## 2019-09-03 DIAGNOSIS — Q039 Congenital hydrocephalus, unspecified: Secondary | ICD-10-CM

## 2019-09-03 DIAGNOSIS — Y92009 Unspecified place in unspecified non-institutional (private) residence as the place of occurrence of the external cause: Secondary | ICD-10-CM

## 2019-09-03 DIAGNOSIS — F0281 Dementia in other diseases classified elsewhere with behavioral disturbance: Secondary | ICD-10-CM

## 2019-09-03 NOTE — Telephone Encounter (Signed)
Leah Payne called stating pt was discharged nursing home Thursday 2/4 and they need [facility] to come out and help them.  They need an order for this

## 2019-09-04 DIAGNOSIS — I509 Heart failure, unspecified: Secondary | ICD-10-CM | POA: Diagnosis not present

## 2019-09-04 DIAGNOSIS — F0280 Dementia in other diseases classified elsewhere without behavioral disturbance: Secondary | ICD-10-CM | POA: Diagnosis not present

## 2019-09-04 DIAGNOSIS — Q759 Congenital malformation of skull and face bones, unspecified: Secondary | ICD-10-CM | POA: Diagnosis not present

## 2019-09-04 DIAGNOSIS — G309 Alzheimer's disease, unspecified: Secondary | ICD-10-CM | POA: Diagnosis not present

## 2019-09-04 DIAGNOSIS — K529 Noninfective gastroenteritis and colitis, unspecified: Secondary | ICD-10-CM | POA: Diagnosis not present

## 2019-09-04 DIAGNOSIS — G40909 Epilepsy, unspecified, not intractable, without status epilepticus: Secondary | ICD-10-CM | POA: Diagnosis not present

## 2019-09-04 DIAGNOSIS — I110 Hypertensive heart disease with heart failure: Secondary | ICD-10-CM | POA: Diagnosis not present

## 2019-09-04 DIAGNOSIS — E1142 Type 2 diabetes mellitus with diabetic polyneuropathy: Secondary | ICD-10-CM | POA: Diagnosis not present

## 2019-09-04 DIAGNOSIS — E559 Vitamin D deficiency, unspecified: Secondary | ICD-10-CM | POA: Diagnosis not present

## 2019-09-04 NOTE — Telephone Encounter (Signed)
Called [facility] and patient they have acccepted patients.

## 2019-09-04 NOTE — Telephone Encounter (Signed)
New order placed

## 2019-09-05 ENCOUNTER — Telehealth: Payer: Self-pay | Admitting: Family Medicine

## 2019-09-05 DIAGNOSIS — E1142 Type 2 diabetes mellitus with diabetic polyneuropathy: Secondary | ICD-10-CM

## 2019-09-05 DIAGNOSIS — E559 Vitamin D deficiency, unspecified: Secondary | ICD-10-CM | POA: Diagnosis not present

## 2019-09-05 DIAGNOSIS — Q759 Congenital malformation of skull and face bones, unspecified: Secondary | ICD-10-CM | POA: Diagnosis not present

## 2019-09-05 DIAGNOSIS — G40909 Epilepsy, unspecified, not intractable, without status epilepticus: Secondary | ICD-10-CM | POA: Diagnosis not present

## 2019-09-05 DIAGNOSIS — I110 Hypertensive heart disease with heart failure: Secondary | ICD-10-CM | POA: Diagnosis not present

## 2019-09-05 DIAGNOSIS — K529 Noninfective gastroenteritis and colitis, unspecified: Secondary | ICD-10-CM | POA: Diagnosis not present

## 2019-09-05 DIAGNOSIS — G309 Alzheimer's disease, unspecified: Secondary | ICD-10-CM | POA: Diagnosis not present

## 2019-09-05 DIAGNOSIS — F0280 Dementia in other diseases classified elsewhere without behavioral disturbance: Secondary | ICD-10-CM | POA: Diagnosis not present

## 2019-09-05 DIAGNOSIS — I509 Heart failure, unspecified: Secondary | ICD-10-CM | POA: Diagnosis not present

## 2019-09-05 NOTE — Telephone Encounter (Signed)
Leah Payne with [facility] called requesting that our office send a prescription for DM supplies to the patients local pharmacy. Pt is needing a Glucometer and supplies - strips and lancets.   Please advise, thanks.

## 2019-09-06 DIAGNOSIS — G40909 Epilepsy, unspecified, not intractable, without status epilepticus: Secondary | ICD-10-CM | POA: Diagnosis not present

## 2019-09-06 DIAGNOSIS — I110 Hypertensive heart disease with heart failure: Secondary | ICD-10-CM | POA: Diagnosis not present

## 2019-09-06 DIAGNOSIS — I509 Heart failure, unspecified: Secondary | ICD-10-CM | POA: Diagnosis not present

## 2019-09-06 DIAGNOSIS — Q759 Congenital malformation of skull and face bones, unspecified: Secondary | ICD-10-CM | POA: Diagnosis not present

## 2019-09-06 DIAGNOSIS — E559 Vitamin D deficiency, unspecified: Secondary | ICD-10-CM | POA: Diagnosis not present

## 2019-09-06 DIAGNOSIS — G309 Alzheimer's disease, unspecified: Secondary | ICD-10-CM | POA: Diagnosis not present

## 2019-09-06 DIAGNOSIS — E1142 Type 2 diabetes mellitus with diabetic polyneuropathy: Secondary | ICD-10-CM | POA: Diagnosis not present

## 2019-09-06 DIAGNOSIS — K529 Noninfective gastroenteritis and colitis, unspecified: Secondary | ICD-10-CM | POA: Diagnosis not present

## 2019-09-06 DIAGNOSIS — F0280 Dementia in other diseases classified elsewhere without behavioral disturbance: Secondary | ICD-10-CM | POA: Diagnosis not present

## 2019-09-06 MED ORDER — BD SWAB SINGLE USE REGULAR PADS: MEDICATED_PAD | 1 refills | Status: DC

## 2019-09-06 MED ORDER — ACCU-CHEK FASTCLIX LANCETS MISC: 1 refills | Status: DC

## 2019-09-06 MED ORDER — ACCU-CHEK GUIDE W/DEVICE KIT: 1.0000 | PACK | 0 refills | Status: DC

## 2019-09-06 MED ORDER — ACCU-CHEK GUIDE VI STRP: ORAL_STRIP | 1 refills | Status: DC

## 2019-09-06 NOTE — Telephone Encounter (Signed)
Spoke with Jola Babinski of [facility] asking if she knew what brand glucometer is covered by pt's ins.  States she is not sure.

## 2019-09-06 NOTE — Telephone Encounter (Signed)
Spoke with Byrd Hesselbach of [facility] asking what brand glucometer is covered for pt.  States there is no preferred brand.    E-scribed Accu-Chek Guide meter, test strips and Fastclix lancets to [employer] [facility] [employer].

## 2019-09-09 ENCOUNTER — Ambulatory Visit: Payer: Medicare HMO | Admitting: Family Medicine

## 2019-09-10 DIAGNOSIS — N3281 Overactive bladder: Secondary | ICD-10-CM

## 2019-09-10 DIAGNOSIS — E785 Hyperlipidemia, unspecified: Secondary | ICD-10-CM

## 2019-09-10 DIAGNOSIS — G309 Alzheimer's disease, unspecified: Secondary | ICD-10-CM | POA: Diagnosis not present

## 2019-09-10 DIAGNOSIS — Q759 Congenital malformation of skull and face bones, unspecified: Secondary | ICD-10-CM | POA: Diagnosis not present

## 2019-09-10 DIAGNOSIS — I509 Heart failure, unspecified: Secondary | ICD-10-CM | POA: Diagnosis not present

## 2019-09-10 DIAGNOSIS — G40909 Epilepsy, unspecified, not intractable, without status epilepticus: Secondary | ICD-10-CM | POA: Diagnosis not present

## 2019-09-10 DIAGNOSIS — F329 Major depressive disorder, single episode, unspecified: Secondary | ICD-10-CM

## 2019-09-10 DIAGNOSIS — E559 Vitamin D deficiency, unspecified: Secondary | ICD-10-CM | POA: Diagnosis not present

## 2019-09-10 DIAGNOSIS — I110 Hypertensive heart disease with heart failure: Secondary | ICD-10-CM | POA: Diagnosis not present

## 2019-09-10 DIAGNOSIS — K529 Noninfective gastroenteritis and colitis, unspecified: Secondary | ICD-10-CM | POA: Diagnosis not present

## 2019-09-10 DIAGNOSIS — F0280 Dementia in other diseases classified elsewhere without behavioral disturbance: Secondary | ICD-10-CM | POA: Diagnosis not present

## 2019-09-10 DIAGNOSIS — Z9181 History of falling: Secondary | ICD-10-CM

## 2019-09-10 DIAGNOSIS — E1142 Type 2 diabetes mellitus with diabetic polyneuropathy: Secondary | ICD-10-CM | POA: Diagnosis not present

## 2019-09-11 ENCOUNTER — Ambulatory Visit: Payer: Medicare HMO | Admitting: Family Medicine

## 2019-09-11 DIAGNOSIS — G40909 Epilepsy, unspecified, not intractable, without status epilepticus: Secondary | ICD-10-CM | POA: Diagnosis not present

## 2019-09-11 DIAGNOSIS — E559 Vitamin D deficiency, unspecified: Secondary | ICD-10-CM | POA: Diagnosis not present

## 2019-09-11 DIAGNOSIS — Q759 Congenital malformation of skull and face bones, unspecified: Secondary | ICD-10-CM | POA: Diagnosis not present

## 2019-09-11 DIAGNOSIS — G309 Alzheimer's disease, unspecified: Secondary | ICD-10-CM | POA: Diagnosis not present

## 2019-09-11 DIAGNOSIS — F0280 Dementia in other diseases classified elsewhere without behavioral disturbance: Secondary | ICD-10-CM | POA: Diagnosis not present

## 2019-09-11 DIAGNOSIS — K529 Noninfective gastroenteritis and colitis, unspecified: Secondary | ICD-10-CM | POA: Diagnosis not present

## 2019-09-11 DIAGNOSIS — I509 Heart failure, unspecified: Secondary | ICD-10-CM | POA: Diagnosis not present

## 2019-09-11 DIAGNOSIS — I110 Hypertensive heart disease with heart failure: Secondary | ICD-10-CM | POA: Diagnosis not present

## 2019-09-11 DIAGNOSIS — E1142 Type 2 diabetes mellitus with diabetic polyneuropathy: Secondary | ICD-10-CM | POA: Diagnosis not present

## 2019-09-12 ENCOUNTER — Ambulatory Visit: Payer: Medicare HMO | Admitting: Family Medicine

## 2019-09-16 DIAGNOSIS — I110 Hypertensive heart disease with heart failure: Secondary | ICD-10-CM | POA: Diagnosis not present

## 2019-09-16 DIAGNOSIS — G309 Alzheimer's disease, unspecified: Secondary | ICD-10-CM | POA: Diagnosis not present

## 2019-09-16 DIAGNOSIS — E1142 Type 2 diabetes mellitus with diabetic polyneuropathy: Secondary | ICD-10-CM | POA: Diagnosis not present

## 2019-09-16 DIAGNOSIS — Q759 Congenital malformation of skull and face bones, unspecified: Secondary | ICD-10-CM | POA: Diagnosis not present

## 2019-09-16 DIAGNOSIS — E559 Vitamin D deficiency, unspecified: Secondary | ICD-10-CM | POA: Diagnosis not present

## 2019-09-16 DIAGNOSIS — I509 Heart failure, unspecified: Secondary | ICD-10-CM | POA: Diagnosis not present

## 2019-09-16 DIAGNOSIS — F0280 Dementia in other diseases classified elsewhere without behavioral disturbance: Secondary | ICD-10-CM | POA: Diagnosis not present

## 2019-09-16 DIAGNOSIS — G40909 Epilepsy, unspecified, not intractable, without status epilepticus: Secondary | ICD-10-CM | POA: Diagnosis not present

## 2019-09-16 DIAGNOSIS — K529 Noninfective gastroenteritis and colitis, unspecified: Secondary | ICD-10-CM | POA: Diagnosis not present

## 2019-09-18 DIAGNOSIS — G40909 Epilepsy, unspecified, not intractable, without status epilepticus: Secondary | ICD-10-CM | POA: Diagnosis not present

## 2019-09-18 DIAGNOSIS — K529 Noninfective gastroenteritis and colitis, unspecified: Secondary | ICD-10-CM | POA: Diagnosis not present

## 2019-09-18 DIAGNOSIS — E1142 Type 2 diabetes mellitus with diabetic polyneuropathy: Secondary | ICD-10-CM | POA: Diagnosis not present

## 2019-09-18 DIAGNOSIS — I110 Hypertensive heart disease with heart failure: Secondary | ICD-10-CM | POA: Diagnosis not present

## 2019-09-18 DIAGNOSIS — G309 Alzheimer's disease, unspecified: Secondary | ICD-10-CM | POA: Diagnosis not present

## 2019-09-18 DIAGNOSIS — I509 Heart failure, unspecified: Secondary | ICD-10-CM | POA: Diagnosis not present

## 2019-09-18 DIAGNOSIS — E559 Vitamin D deficiency, unspecified: Secondary | ICD-10-CM | POA: Diagnosis not present

## 2019-09-18 DIAGNOSIS — Q759 Congenital malformation of skull and face bones, unspecified: Secondary | ICD-10-CM | POA: Diagnosis not present

## 2019-09-18 DIAGNOSIS — F0280 Dementia in other diseases classified elsewhere without behavioral disturbance: Secondary | ICD-10-CM | POA: Diagnosis not present

## 2019-09-19 ENCOUNTER — Ambulatory Visit (INDEPENDENT_AMBULATORY_CARE_PROVIDER_SITE_OTHER): Payer: Medicare HMO | Admitting: Family Medicine

## 2019-09-19 ENCOUNTER — Other Ambulatory Visit: Payer: Self-pay

## 2019-09-19 ENCOUNTER — Encounter: Payer: Self-pay | Admitting: Family Medicine

## 2019-09-19 VITALS — BP 136/80 | HR 64 | Temp 97.8°F | Ht 62.0 in | Wt 145.5 lb

## 2019-09-19 DIAGNOSIS — Q039 Congenital hydrocephalus, unspecified: Secondary | ICD-10-CM | POA: Diagnosis not present

## 2019-09-19 DIAGNOSIS — N3281 Overactive bladder: Secondary | ICD-10-CM

## 2019-09-19 DIAGNOSIS — G301 Alzheimer's disease with late onset: Secondary | ICD-10-CM

## 2019-09-19 DIAGNOSIS — F0281 Dementia in other diseases classified elsewhere with behavioral disturbance: Secondary | ICD-10-CM | POA: Diagnosis not present

## 2019-09-19 DIAGNOSIS — R2689 Other abnormalities of gait and mobility: Secondary | ICD-10-CM | POA: Diagnosis not present

## 2019-09-19 DIAGNOSIS — E1142 Type 2 diabetes mellitus with diabetic polyneuropathy: Secondary | ICD-10-CM | POA: Diagnosis not present

## 2019-09-19 DIAGNOSIS — E1136 Type 2 diabetes mellitus with diabetic cataract: Secondary | ICD-10-CM | POA: Diagnosis not present

## 2019-09-19 DIAGNOSIS — F02818 Dementia in other diseases classified elsewhere, unspecified severity, with other behavioral disturbance: Secondary | ICD-10-CM

## 2019-09-19 NOTE — Assessment & Plan Note (Addendum)
Stable period on metformin once daily. Due for eye exam - encouraged they call and schedule appointment

## 2019-09-19 NOTE — Assessment & Plan Note (Signed)
S/p recent rehab stay with benefit. Now with HH involved, awaiting eligibility for [facility] personal care services.

## 2019-09-19 NOTE — Progress Notes (Signed)
This visit was conducted in person.  BP 136/80 (BP Location: Right Arm, Patient Position: Sitting, Cuff Size: Normal)   Pulse 64   Temp 97.8 F (36.6 C) (Temporal)   Ht '5\' 2"'$  (1.575 m)   Wt 145 lb 8 oz (66 kg)   SpO2 98%   BMI 26.61 kg/m    CC: rehab f/u visit Subjective:    Patient ID: Leah Payne, female    DOB: June 26, 1946, 74 y.o.   MRN: [id number]  HPI: Leah Payne is a 74 y.o. female presenting on 09/19/2019 for Follow-up (Here for rehab f/u.  Pt accompanied by husband, [name]- temp 97.8.)   Rescheduled visit several times in the past month.  ER visit 07/26/2019 (while husband was hospitalized) for recurrent falls and generalized weakness, placed in [facility] for short term rehab. Prior to placement latest fall she was stuck in bathtub all night long.   [facility] involved - PT, OT, SN.  In process of finding out about coverage for private aide through [place].   DM - recent cbg's 103-117.  Diarrhea has improved.  Lab Results  Component Value Date   HGBA1C 6.2 08/07/2019     Discharge from [facility] to home: 08/29/2019.      Relevant past medical, surgical, family and social history reviewed and updated as indicated. Interim medical history since our last visit reviewed. Allergies and medications reviewed and updated. Outpatient Medications Prior to Visit  Medication Sig Dispense Refill  . Accu-Chek FastClix Lancets MISC Use as instructed to check blood sugar once daily 100 each 1  . acetaminophen (TYLENOL) 500 MG tablet Take 1 tablet (500 mg total) by mouth every 8 (eight) hours as needed.  0  . Alcohol Swabs (B-D SINGLE USE SWABS REGULAR) PADS Use as instructed to check blood sugar once daily 100 each 1  . atorvastatin (LIPITOR) 40 MG tablet TAKE 1 TABLET EVERY DAY (Patient taking differently: Take 40 mg by mouth daily at 6 PM. TAKE 1 TABLET EVERY DAY) 90 tablet 1  . Blood Glucose Monitoring Suppl (ACCU-CHEK GUIDE) w/Device KIT 1 each by  Does not apply route as directed. Use as instructed to check blood sugar once daily 1 kit 0  . Cholecalciferol (VITAMIN D) 50 MCG (2000 UT) CAPS Take 1 capsule (2,000 Units total) by mouth daily. 30 capsule   . donepezil (ARICEPT) 10 MG tablet TAKE 1 TABLET EVERY DAY (Patient taking differently: Take 10 mg by mouth at bedtime. TAKE 1 TABLET EVERY DAY) 90 tablet 4  . fesoterodine ([name]) 8 MG TB24 tablet TAKE 1 TABLET (8 MG TOTAL) BY MOUTH DAILY. (Patient taking differently: Take 8 mg by mouth daily. TAKE 1 TABLET (8 MG TOTAL) BY MOUTH DAILY.) 90 tablet 1  . glucose blood (ACCU-CHEK GUIDE) test strip Use as instructed to check blood sugar once daily 100 each 1  . levETIRAcetam (KEPPRA) 500 MG tablet TAKE 1 TABLET TWICE DAILY (Patient taking differently: Take 500 mg by mouth 2 (two) times daily. ) 180 tablet 4  . levothyroxine (SYNTHROID) 25 MCG tablet TAKE 1 TABLET DAILY BEFORE BREAKFAST. (Patient taking differently: Take 25 mcg by mouth daily before breakfast. ) 90 tablet 1  . loratadine (CLARITIN) 10 MG tablet Take 1 tablet (10 mg total) by mouth daily.    . meclizine (ANTIVERT) 25 MG tablet TAKE 1/2 TO 1 TABLET TWICE DAILY AS NEEDED FOR DIZZINESS (Patient taking differently: Take 12.5-25 mg by mouth 2 (two) times daily as needed  for dizziness. ) 60 tablet 0  . metFORMIN (GLUCOPHAGE) 500 MG tablet TAKE 1 TABLET EVERY DAY WITH BREAKFAST (Patient taking differently: Take 500 mg by mouth daily with breakfast. ) 90 tablet 1  . mirabegron ER ([name]) 50 MG TB24 tablet Take 1 tablet (50 mg total) by mouth at bedtime. 90 tablet 1  . propranolol (INDERAL) 10 MG tablet TAKE 1 TABLET TWICE DAILY (Patient taking differently: Take 10 mg by mouth 2 (two) times daily. ) 180 tablet 1  . sertraline (ZOLOFT) 100 MG tablet Take 1 tablet (100 mg total) by mouth daily. 90 tablet 1   No facility-administered medications prior to visit.     Per HPI unless specifically indicated in ROS section below Review of  Systems Objective:    BP 136/80 (BP Location: Right Arm, Patient Position: Sitting, Cuff Size: Normal)   Pulse 64   Temp 97.8 F (36.6 C) (Temporal)   Ht '5\' 2"'$  (1.575 m)   Wt 145 lb 8 oz (66 kg)   SpO2 98%   BMI 26.61 kg/m   Wt Readings from Last 3 Encounters:  09/19/19 145 lb 8 oz (66 kg)  05/31/19 145 lb 1 oz (65.8 kg)  05/17/19 151 lb (68.5 kg)    Physical Exam Vitals and nursing note reviewed.  Constitutional:      Appearance: Normal appearance. She is not ill-appearing (chronically).     Comments: Sitting in wheelchair  HENT:     Head: Atraumatic.     Comments: Craniomegaly  Eyes:     Comments:  L cataract present R eye amblyopia with lateral strabismus   Cardiovascular:     Rate and Rhythm: Normal rate and regular rhythm.     Pulses: Normal pulses.     Heart sounds: Normal heart sounds. No murmur.  Pulmonary:     Effort: Pulmonary effort is normal. No respiratory distress.     Breath sounds: Normal breath sounds. No wheezing, rhonchi or rales.  Musculoskeletal:     Right lower leg: No edema.     Left lower leg: No edema.  Skin:    General: Skin is warm and dry.     Findings: No rash.  Psychiatric:        Mood and Affect: Mood normal.        Behavior: Behavior normal.       Results for orders placed or performed in visit on 09/19/19  CBC and differential  Result Value Ref Range   Hemoglobin 14.3 12.0 - 16.0   Platelets 307 150 - 399   WBC 5.4   Basic metabolic panel  Result Value Ref Range   Glucose 163    Creatinine 0.7 0.5 - 1.1   Potassium 4.2 3.4 - 5.3  Comprehensive metabolic panel  Result Value Ref Range   Calcium 10.0 8.7 - 10.7  Hepatic function panel  Result Value Ref Range   Alkaline Phosphatase 96 25 - 125   ALT 18 7 - 35   AST 18 13 - 35   Bilirubin, Total 0.4   Hemoglobin A1c  Result Value Ref Range   Hemoglobin A1C 6.2   TSH  Result Value Ref Range   TSH 3.24 0.41 - 5.90  VITAMIN D 25 Hydroxy (Vit-D Deficiency, Fractures)    Result Value Ref Range   Vit D, 25-Hydroxy 57.6   Vitamin B12  Result Value Ref Range   Vitamin B-12 470    Assessment & Plan:  This visit occurred during the SARS-CoV-2  public health emergency.  Safety protocols were in place, including screening questions prior to the visit, additional usage of staff PPE, and extensive cleaning of exam room while observing appropriate contact time as indicated for disinfecting solutions.   Problem List Items Addressed This Visit    Type 2 diabetes, controlled, 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[place])    Stable period on metformin once daily. Due for eye exam - encouraged they call and schedule appointment      Overactive bladder    Stable period on toviaz and myrbetriq      Imbalance - Primary    S/p recent rehab stay with benefit. Now with HH involved, awaiting eligibility for [facility] personal care services.      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[place])    Noted on exam today.       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HCC)   Alzheimer disease ([place])    Continue aricept '5mg'$  daily. Overdue for neurology f/u visit - will call to schedule appointment.           No orders of the defined types were placed in this encounter.  No orders of the defined types were placed in this encounter.   Patient instructions: Call neurology to schedule follow up visit.  Call to schedule eye doctor appointment.  Return to see me in 5 months for wellness visit/physical.   Follow up plan: Return in about 5 months (around 02/16/2020) for annual exam, prior fasting for blood work, medicare wellness visit.  [name], MD

## 2019-09-19 NOTE — Assessment & Plan Note (Signed)
Noted on exam today.  

## 2019-09-19 NOTE — Patient Instructions (Addendum)
Call neurology to schedule follow up visit.  Call to schedule eye doctor appointment.  Return to see me in 5 months for wellness visit/physical.   Goal fasting sugar range 80-120 Goal sugar range 2 hours after eating <180.  Watch for low sugars <70. If this happens, follow 15-15 rule.  The 15-15 rule: Have 15 grams of carbohydrate to raise your blood sugar and check it after 15 minutes. If it's still below 70 mg/dL, have another serving. Repeat these steps until your blood sugar is at least 70 mg/dL. Once your blood sugar is back to normal, eat a meal or snack to make sure it doesn't lower again.  This may be: -Glucose tablets (see instructions) -Gel tube (see instructions) -4 ounces (1/2 cup) of juice or regular soda (not diet) -1 tablespoon of sugar, honey, or corn syrup -Hard candies, jellybeans or gumdrops--see food label for how many to consume

## 2019-09-19 NOTE — Assessment & Plan Note (Signed)
Continue aricept 5mg  daily. Overdue for neurology f/u visit - will call to schedule appointment.

## 2019-09-19 NOTE — Assessment & Plan Note (Signed)
Stable period on [place]

## 2019-09-23 DIAGNOSIS — G40909 Epilepsy, unspecified, not intractable, without status epilepticus: Secondary | ICD-10-CM | POA: Diagnosis not present

## 2019-09-23 DIAGNOSIS — I110 Hypertensive heart disease with heart failure: Secondary | ICD-10-CM | POA: Diagnosis not present

## 2019-09-23 DIAGNOSIS — Q759 Congenital malformation of skull and face bones, unspecified: Secondary | ICD-10-CM | POA: Diagnosis not present

## 2019-09-23 DIAGNOSIS — F0280 Dementia in other diseases classified elsewhere without behavioral disturbance: Secondary | ICD-10-CM | POA: Diagnosis not present

## 2019-09-23 DIAGNOSIS — E1142 Type 2 diabetes mellitus with diabetic polyneuropathy: Secondary | ICD-10-CM | POA: Diagnosis not present

## 2019-09-23 DIAGNOSIS — I509 Heart failure, unspecified: Secondary | ICD-10-CM | POA: Diagnosis not present

## 2019-09-23 DIAGNOSIS — E559 Vitamin D deficiency, unspecified: Secondary | ICD-10-CM | POA: Diagnosis not present

## 2019-09-23 DIAGNOSIS — G309 Alzheimer's disease, unspecified: Secondary | ICD-10-CM | POA: Diagnosis not present

## 2019-09-23 DIAGNOSIS — K529 Noninfective gastroenteritis and colitis, unspecified: Secondary | ICD-10-CM | POA: Diagnosis not present

## 2019-09-25 DIAGNOSIS — F0280 Dementia in other diseases classified elsewhere without behavioral disturbance: Secondary | ICD-10-CM | POA: Diagnosis not present

## 2019-09-25 DIAGNOSIS — K529 Noninfective gastroenteritis and colitis, unspecified: Secondary | ICD-10-CM | POA: Diagnosis not present

## 2019-09-25 DIAGNOSIS — I110 Hypertensive heart disease with heart failure: Secondary | ICD-10-CM | POA: Diagnosis not present

## 2019-09-25 DIAGNOSIS — E559 Vitamin D deficiency, unspecified: Secondary | ICD-10-CM | POA: Diagnosis not present

## 2019-09-25 DIAGNOSIS — G40909 Epilepsy, unspecified, not intractable, without status epilepticus: Secondary | ICD-10-CM | POA: Diagnosis not present

## 2019-09-25 DIAGNOSIS — E1142 Type 2 diabetes mellitus with diabetic polyneuropathy: Secondary | ICD-10-CM | POA: Diagnosis not present

## 2019-09-25 DIAGNOSIS — I509 Heart failure, unspecified: Secondary | ICD-10-CM | POA: Diagnosis not present

## 2019-09-25 DIAGNOSIS — G309 Alzheimer's disease, unspecified: Secondary | ICD-10-CM | POA: Diagnosis not present

## 2019-09-25 DIAGNOSIS — Q759 Congenital malformation of skull and face bones, unspecified: Secondary | ICD-10-CM | POA: Diagnosis not present

## 2019-09-30 DIAGNOSIS — I509 Heart failure, unspecified: Secondary | ICD-10-CM | POA: Diagnosis not present

## 2019-09-30 DIAGNOSIS — K529 Noninfective gastroenteritis and colitis, unspecified: Secondary | ICD-10-CM | POA: Diagnosis not present

## 2019-09-30 DIAGNOSIS — E1142 Type 2 diabetes mellitus with diabetic polyneuropathy: Secondary | ICD-10-CM | POA: Diagnosis not present

## 2019-09-30 DIAGNOSIS — G309 Alzheimer's disease, unspecified: Secondary | ICD-10-CM | POA: Diagnosis not present

## 2019-09-30 DIAGNOSIS — I110 Hypertensive heart disease with heart failure: Secondary | ICD-10-CM | POA: Diagnosis not present

## 2019-09-30 DIAGNOSIS — E559 Vitamin D deficiency, unspecified: Secondary | ICD-10-CM | POA: Diagnosis not present

## 2019-09-30 DIAGNOSIS — G40909 Epilepsy, unspecified, not intractable, without status epilepticus: Secondary | ICD-10-CM | POA: Diagnosis not present

## 2019-09-30 DIAGNOSIS — F0280 Dementia in other diseases classified elsewhere without behavioral disturbance: Secondary | ICD-10-CM | POA: Diagnosis not present

## 2019-09-30 DIAGNOSIS — Q759 Congenital malformation of skull and face bones, unspecified: Secondary | ICD-10-CM | POA: Diagnosis not present

## 2019-10-02 DIAGNOSIS — I509 Heart failure, unspecified: Secondary | ICD-10-CM | POA: Diagnosis not present

## 2019-10-02 DIAGNOSIS — E559 Vitamin D deficiency, unspecified: Secondary | ICD-10-CM | POA: Diagnosis not present

## 2019-10-02 DIAGNOSIS — I110 Hypertensive heart disease with heart failure: Secondary | ICD-10-CM | POA: Diagnosis not present

## 2019-10-02 DIAGNOSIS — G40909 Epilepsy, unspecified, not intractable, without status epilepticus: Secondary | ICD-10-CM | POA: Diagnosis not present

## 2019-10-02 DIAGNOSIS — Q759 Congenital malformation of skull and face bones, unspecified: Secondary | ICD-10-CM | POA: Diagnosis not present

## 2019-10-02 DIAGNOSIS — E1142 Type 2 diabetes mellitus with diabetic polyneuropathy: Secondary | ICD-10-CM | POA: Diagnosis not present

## 2019-10-02 DIAGNOSIS — G309 Alzheimer's disease, unspecified: Secondary | ICD-10-CM | POA: Diagnosis not present

## 2019-10-02 DIAGNOSIS — K529 Noninfective gastroenteritis and colitis, unspecified: Secondary | ICD-10-CM | POA: Diagnosis not present

## 2019-10-02 DIAGNOSIS — F0280 Dementia in other diseases classified elsewhere without behavioral disturbance: Secondary | ICD-10-CM | POA: Diagnosis not present

## 2019-10-03 ENCOUNTER — Other Ambulatory Visit: Payer: Self-pay | Admitting: Family Medicine

## 2019-10-03 NOTE — Telephone Encounter (Signed)
Left message on vm per dpr asking pt's husband, Leah Payne (on dpr), to call back confirming zinc sulfate is what pt needs refill for.    I do not see this on med list.  Am I over looking it or does it go by another name?

## 2019-10-03 NOTE — Telephone Encounter (Signed)
I don't know which medicine he's talking about. Please ask. It seems they may need refills on several.

## 2019-10-03 NOTE — Telephone Encounter (Signed)
Patients husband came in needing a medication refill for zinc sulfate for patient. He said she has enough for 7 more days.

## 2019-10-04 MED ORDER — ZINC SULFATE 140 (50 ZN) MG PO TABS: 1.0000 | ORAL_TABLET | Freq: Every day | ORAL | 3 refills | Status: DC

## 2019-10-04 NOTE — Telephone Encounter (Signed)
sent 

## 2019-10-04 NOTE — Telephone Encounter (Signed)
Spoke with Mr. Kobel to get clarification for refill request for pt.  States pt was given zinc sulfate during hospital stay.  Says if she needs to continue med, she need a refill sent in.  FYI to Dr. [name].

## 2019-10-04 NOTE — Addendum Note (Signed)
Addended by: Eustaquio Boyden on: 10/04/2019 08:19 PM   Modules accepted: Orders

## 2019-10-07 DIAGNOSIS — E1142 Type 2 diabetes mellitus with diabetic polyneuropathy: Secondary | ICD-10-CM | POA: Diagnosis not present

## 2019-10-07 DIAGNOSIS — I110 Hypertensive heart disease with heart failure: Secondary | ICD-10-CM | POA: Diagnosis not present

## 2019-10-07 DIAGNOSIS — F0280 Dementia in other diseases classified elsewhere without behavioral disturbance: Secondary | ICD-10-CM | POA: Diagnosis not present

## 2019-10-07 DIAGNOSIS — Q759 Congenital malformation of skull and face bones, unspecified: Secondary | ICD-10-CM | POA: Diagnosis not present

## 2019-10-07 DIAGNOSIS — E559 Vitamin D deficiency, unspecified: Secondary | ICD-10-CM | POA: Diagnosis not present

## 2019-10-07 DIAGNOSIS — G40909 Epilepsy, unspecified, not intractable, without status epilepticus: Secondary | ICD-10-CM | POA: Diagnosis not present

## 2019-10-07 DIAGNOSIS — K529 Noninfective gastroenteritis and colitis, unspecified: Secondary | ICD-10-CM | POA: Diagnosis not present

## 2019-10-07 DIAGNOSIS — G309 Alzheimer's disease, unspecified: Secondary | ICD-10-CM | POA: Diagnosis not present

## 2019-10-07 DIAGNOSIS — I509 Heart failure, unspecified: Secondary | ICD-10-CM | POA: Diagnosis not present

## 2019-10-14 DIAGNOSIS — K529 Noninfective gastroenteritis and colitis, unspecified: Secondary | ICD-10-CM | POA: Diagnosis not present

## 2019-10-14 DIAGNOSIS — I110 Hypertensive heart disease with heart failure: Secondary | ICD-10-CM | POA: Diagnosis not present

## 2019-10-14 DIAGNOSIS — E559 Vitamin D deficiency, unspecified: Secondary | ICD-10-CM | POA: Diagnosis not present

## 2019-10-14 DIAGNOSIS — F0280 Dementia in other diseases classified elsewhere without behavioral disturbance: Secondary | ICD-10-CM | POA: Diagnosis not present

## 2019-10-14 DIAGNOSIS — I509 Heart failure, unspecified: Secondary | ICD-10-CM | POA: Diagnosis not present

## 2019-10-14 DIAGNOSIS — G309 Alzheimer's disease, unspecified: Secondary | ICD-10-CM | POA: Diagnosis not present

## 2019-10-14 DIAGNOSIS — G40909 Epilepsy, unspecified, not intractable, without status epilepticus: Secondary | ICD-10-CM | POA: Diagnosis not present

## 2019-10-14 DIAGNOSIS — E1142 Type 2 diabetes mellitus with diabetic polyneuropathy: Secondary | ICD-10-CM | POA: Diagnosis not present

## 2019-10-14 DIAGNOSIS — Q759 Congenital malformation of skull and face bones, unspecified: Secondary | ICD-10-CM | POA: Diagnosis not present

## 2019-10-18 ENCOUNTER — Other Ambulatory Visit: Payer: Self-pay

## 2019-10-18 NOTE — Telephone Encounter (Signed)
Patient's husband, [name], called asking for refills on patient's Donepezil and Sertraline. Donepezil was refilled by neurologist last time, does this request need to go to the neurologist? Please review. Needs this sent to [facility] pharmacy

## 2019-10-21 DIAGNOSIS — F0280 Dementia in other diseases classified elsewhere without behavioral disturbance: Secondary | ICD-10-CM | POA: Diagnosis not present

## 2019-10-21 DIAGNOSIS — E559 Vitamin D deficiency, unspecified: Secondary | ICD-10-CM | POA: Diagnosis not present

## 2019-10-21 DIAGNOSIS — I110 Hypertensive heart disease with heart failure: Secondary | ICD-10-CM | POA: Diagnosis not present

## 2019-10-21 DIAGNOSIS — I509 Heart failure, unspecified: Secondary | ICD-10-CM | POA: Diagnosis not present

## 2019-10-21 DIAGNOSIS — E1142 Type 2 diabetes mellitus with diabetic polyneuropathy: Secondary | ICD-10-CM | POA: Diagnosis not present

## 2019-10-21 DIAGNOSIS — K529 Noninfective gastroenteritis and colitis, unspecified: Secondary | ICD-10-CM | POA: Diagnosis not present

## 2019-10-21 DIAGNOSIS — Q759 Congenital malformation of skull and face bones, unspecified: Secondary | ICD-10-CM | POA: Diagnosis not present

## 2019-10-21 DIAGNOSIS — G40909 Epilepsy, unspecified, not intractable, without status epilepticus: Secondary | ICD-10-CM | POA: Diagnosis not present

## 2019-10-21 DIAGNOSIS — G309 Alzheimer's disease, unspecified: Secondary | ICD-10-CM | POA: Diagnosis not present

## 2019-10-22 MED ORDER — DONEPEZIL HCL 10 MG PO TABS: 10.0000 mg | ORAL_TABLET | Freq: Every day | ORAL | 2 refills | Status: DC

## 2019-10-22 MED ORDER — SERTRALINE HCL 100 MG PO TABS: 100.0000 mg | ORAL_TABLET | Freq: Every day | ORAL | 2 refills | Status: DC

## 2019-10-23 DIAGNOSIS — I110 Hypertensive heart disease with heart failure: Secondary | ICD-10-CM | POA: Diagnosis not present

## 2019-10-23 DIAGNOSIS — E559 Vitamin D deficiency, unspecified: Secondary | ICD-10-CM | POA: Diagnosis not present

## 2019-10-23 DIAGNOSIS — I509 Heart failure, unspecified: Secondary | ICD-10-CM | POA: Diagnosis not present

## 2019-10-23 DIAGNOSIS — G309 Alzheimer's disease, unspecified: Secondary | ICD-10-CM | POA: Diagnosis not present

## 2019-10-23 DIAGNOSIS — K529 Noninfective gastroenteritis and colitis, unspecified: Secondary | ICD-10-CM | POA: Diagnosis not present

## 2019-10-23 DIAGNOSIS — Q759 Congenital malformation of skull and face bones, unspecified: Secondary | ICD-10-CM | POA: Diagnosis not present

## 2019-10-23 DIAGNOSIS — F0280 Dementia in other diseases classified elsewhere without behavioral disturbance: Secondary | ICD-10-CM | POA: Diagnosis not present

## 2019-10-23 DIAGNOSIS — G40909 Epilepsy, unspecified, not intractable, without status epilepticus: Secondary | ICD-10-CM | POA: Diagnosis not present

## 2019-10-23 DIAGNOSIS — E1142 Type 2 diabetes mellitus with diabetic polyneuropathy: Secondary | ICD-10-CM | POA: Diagnosis not present

## 2019-10-29 ENCOUNTER — Telehealth: Payer: Self-pay

## 2019-10-29 MED ORDER — TOVIAZ 8 MG PO TB24: ORAL_TABLET | ORAL | 1 refills | Status: DC

## 2019-10-29 MED ORDER — MIRABEGRON ER 50 MG PO TB24: 50.0000 mg | ORAL_TABLET | Freq: Every day | ORAL | 1 refills | Status: DC

## 2019-10-29 NOTE — Telephone Encounter (Signed)
Pt's husband in today for coumadin clinic requesting refills of myrbetriq and toviaz sent to [facility]. Advised these would be sent in. Pt appreciative.  Last OV2/25/21 Next OV 02/24/20 Last filled 10/15/18

## 2019-10-31 DIAGNOSIS — E559 Vitamin D deficiency, unspecified: Secondary | ICD-10-CM | POA: Diagnosis not present

## 2019-10-31 DIAGNOSIS — E1142 Type 2 diabetes mellitus with diabetic polyneuropathy: Secondary | ICD-10-CM | POA: Diagnosis not present

## 2019-10-31 DIAGNOSIS — K529 Noninfective gastroenteritis and colitis, unspecified: Secondary | ICD-10-CM | POA: Diagnosis not present

## 2019-10-31 DIAGNOSIS — G40909 Epilepsy, unspecified, not intractable, without status epilepticus: Secondary | ICD-10-CM | POA: Diagnosis not present

## 2019-10-31 DIAGNOSIS — Q759 Congenital malformation of skull and face bones, unspecified: Secondary | ICD-10-CM | POA: Diagnosis not present

## 2019-10-31 DIAGNOSIS — I509 Heart failure, unspecified: Secondary | ICD-10-CM | POA: Diagnosis not present

## 2019-10-31 DIAGNOSIS — F0280 Dementia in other diseases classified elsewhere without behavioral disturbance: Secondary | ICD-10-CM | POA: Diagnosis not present

## 2019-10-31 DIAGNOSIS — G309 Alzheimer's disease, unspecified: Secondary | ICD-10-CM | POA: Diagnosis not present

## 2019-10-31 DIAGNOSIS — I110 Hypertensive heart disease with heart failure: Secondary | ICD-10-CM | POA: Diagnosis not present

## 2019-11-07 DIAGNOSIS — G309 Alzheimer's disease, unspecified: Secondary | ICD-10-CM | POA: Diagnosis not present

## 2019-11-07 DIAGNOSIS — E1142 Type 2 diabetes mellitus with diabetic polyneuropathy: Secondary | ICD-10-CM | POA: Diagnosis not present

## 2019-11-07 DIAGNOSIS — F0280 Dementia in other diseases classified elsewhere without behavioral disturbance: Secondary | ICD-10-CM | POA: Diagnosis not present

## 2019-11-07 DIAGNOSIS — Q759 Congenital malformation of skull and face bones, unspecified: Secondary | ICD-10-CM | POA: Diagnosis not present

## 2019-11-07 DIAGNOSIS — K529 Noninfective gastroenteritis and colitis, unspecified: Secondary | ICD-10-CM | POA: Diagnosis not present

## 2019-11-07 DIAGNOSIS — E559 Vitamin D deficiency, unspecified: Secondary | ICD-10-CM | POA: Diagnosis not present

## 2019-11-07 DIAGNOSIS — I110 Hypertensive heart disease with heart failure: Secondary | ICD-10-CM | POA: Diagnosis not present

## 2019-11-07 DIAGNOSIS — G40909 Epilepsy, unspecified, not intractable, without status epilepticus: Secondary | ICD-10-CM | POA: Diagnosis not present

## 2019-11-07 DIAGNOSIS — I509 Heart failure, unspecified: Secondary | ICD-10-CM | POA: Diagnosis not present

## 2019-11-11 DIAGNOSIS — E559 Vitamin D deficiency, unspecified: Secondary | ICD-10-CM | POA: Diagnosis not present

## 2019-11-11 DIAGNOSIS — R32 Unspecified urinary incontinence: Secondary | ICD-10-CM

## 2019-11-11 DIAGNOSIS — Z79899 Other long term (current) drug therapy: Secondary | ICD-10-CM

## 2019-11-11 DIAGNOSIS — Q039 Congenital hydrocephalus, unspecified: Secondary | ICD-10-CM

## 2019-11-11 DIAGNOSIS — E1142 Type 2 diabetes mellitus with diabetic polyneuropathy: Secondary | ICD-10-CM

## 2019-11-11 DIAGNOSIS — Q759 Congenital malformation of skull and face bones, unspecified: Secondary | ICD-10-CM | POA: Diagnosis not present

## 2019-11-11 DIAGNOSIS — N3281 Overactive bladder: Secondary | ICD-10-CM

## 2019-11-11 DIAGNOSIS — G40909 Epilepsy, unspecified, not intractable, without status epilepticus: Secondary | ICD-10-CM | POA: Diagnosis not present

## 2019-11-11 DIAGNOSIS — I509 Heart failure, unspecified: Secondary | ICD-10-CM | POA: Diagnosis not present

## 2019-11-11 DIAGNOSIS — B372 Candidiasis of skin and nail: Secondary | ICD-10-CM

## 2019-11-11 DIAGNOSIS — E785 Hyperlipidemia, unspecified: Secondary | ICD-10-CM

## 2019-11-11 DIAGNOSIS — G309 Alzheimer's disease, unspecified: Secondary | ICD-10-CM | POA: Diagnosis not present

## 2019-11-11 DIAGNOSIS — K529 Noninfective gastroenteritis and colitis, unspecified: Secondary | ICD-10-CM | POA: Diagnosis not present

## 2019-11-11 DIAGNOSIS — F0280 Dementia in other diseases classified elsewhere without behavioral disturbance: Secondary | ICD-10-CM | POA: Diagnosis not present

## 2019-11-11 DIAGNOSIS — I110 Hypertensive heart disease with heart failure: Secondary | ICD-10-CM | POA: Diagnosis not present

## 2019-11-11 DIAGNOSIS — F329 Major depressive disorder, single episode, unspecified: Secondary | ICD-10-CM

## 2019-11-11 DIAGNOSIS — E039 Hypothyroidism, unspecified: Secondary | ICD-10-CM

## 2019-11-11 DIAGNOSIS — Z9181 History of falling: Secondary | ICD-10-CM

## 2019-11-11 DIAGNOSIS — Z7984 Long term (current) use of oral hypoglycemic drugs: Secondary | ICD-10-CM

## 2019-11-12 DIAGNOSIS — K529 Noninfective gastroenteritis and colitis, unspecified: Secondary | ICD-10-CM | POA: Diagnosis not present

## 2019-11-12 DIAGNOSIS — G40909 Epilepsy, unspecified, not intractable, without status epilepticus: Secondary | ICD-10-CM | POA: Diagnosis not present

## 2019-11-12 DIAGNOSIS — F0280 Dementia in other diseases classified elsewhere without behavioral disturbance: Secondary | ICD-10-CM | POA: Diagnosis not present

## 2019-11-12 DIAGNOSIS — E559 Vitamin D deficiency, unspecified: Secondary | ICD-10-CM | POA: Diagnosis not present

## 2019-11-12 DIAGNOSIS — E1142 Type 2 diabetes mellitus with diabetic polyneuropathy: Secondary | ICD-10-CM | POA: Diagnosis not present

## 2019-11-12 DIAGNOSIS — G309 Alzheimer's disease, unspecified: Secondary | ICD-10-CM | POA: Diagnosis not present

## 2019-11-12 DIAGNOSIS — I110 Hypertensive heart disease with heart failure: Secondary | ICD-10-CM | POA: Diagnosis not present

## 2019-11-12 DIAGNOSIS — Q759 Congenital malformation of skull and face bones, unspecified: Secondary | ICD-10-CM | POA: Diagnosis not present

## 2019-11-12 DIAGNOSIS — I509 Heart failure, unspecified: Secondary | ICD-10-CM | POA: Diagnosis not present

## 2019-11-14 ENCOUNTER — Other Ambulatory Visit: Payer: Self-pay

## 2019-11-14 NOTE — Telephone Encounter (Signed)
Patient's husband called asking for refill on Keppra for patient. Would like this sent to [facility] pharmacy. Per our records patient should still have refills on file with [facility] pharmacy. I called [facility] pharmacy and spoke with Amber who said they did have refill on file for this medication but on 11/12/19 [facility] filled this medication for the patient. It is there now for pick up. I called [facility] to verify that they had this medication ready and they do. I advised patient's husband-Leah Payne- that medication is ready for pick up at [facility] pharmacy.

## 2019-11-16 DIAGNOSIS — K529 Noninfective gastroenteritis and colitis, unspecified: Secondary | ICD-10-CM | POA: Diagnosis not present

## 2019-11-16 DIAGNOSIS — G309 Alzheimer's disease, unspecified: Secondary | ICD-10-CM | POA: Diagnosis not present

## 2019-11-16 DIAGNOSIS — I509 Heart failure, unspecified: Secondary | ICD-10-CM | POA: Diagnosis not present

## 2019-11-16 DIAGNOSIS — G40909 Epilepsy, unspecified, not intractable, without status epilepticus: Secondary | ICD-10-CM | POA: Diagnosis not present

## 2019-11-16 DIAGNOSIS — E1142 Type 2 diabetes mellitus with diabetic polyneuropathy: Secondary | ICD-10-CM | POA: Diagnosis not present

## 2019-11-16 DIAGNOSIS — Q759 Congenital malformation of skull and face bones, unspecified: Secondary | ICD-10-CM | POA: Diagnosis not present

## 2019-11-16 DIAGNOSIS — F0280 Dementia in other diseases classified elsewhere without behavioral disturbance: Secondary | ICD-10-CM | POA: Diagnosis not present

## 2019-11-16 DIAGNOSIS — I110 Hypertensive heart disease with heart failure: Secondary | ICD-10-CM | POA: Diagnosis not present

## 2019-11-16 DIAGNOSIS — E559 Vitamin D deficiency, unspecified: Secondary | ICD-10-CM | POA: Diagnosis not present

## 2019-11-18 DIAGNOSIS — G309 Alzheimer's disease, unspecified: Secondary | ICD-10-CM | POA: Diagnosis not present

## 2019-11-18 DIAGNOSIS — E1142 Type 2 diabetes mellitus with diabetic polyneuropathy: Secondary | ICD-10-CM | POA: Diagnosis not present

## 2019-11-18 DIAGNOSIS — K529 Noninfective gastroenteritis and colitis, unspecified: Secondary | ICD-10-CM | POA: Diagnosis not present

## 2019-11-18 DIAGNOSIS — F0280 Dementia in other diseases classified elsewhere without behavioral disturbance: Secondary | ICD-10-CM | POA: Diagnosis not present

## 2019-11-18 DIAGNOSIS — Q759 Congenital malformation of skull and face bones, unspecified: Secondary | ICD-10-CM | POA: Diagnosis not present

## 2019-11-18 DIAGNOSIS — E559 Vitamin D deficiency, unspecified: Secondary | ICD-10-CM | POA: Diagnosis not present

## 2019-11-18 DIAGNOSIS — I110 Hypertensive heart disease with heart failure: Secondary | ICD-10-CM | POA: Diagnosis not present

## 2019-11-18 DIAGNOSIS — I509 Heart failure, unspecified: Secondary | ICD-10-CM | POA: Diagnosis not present

## 2019-11-18 DIAGNOSIS — G40909 Epilepsy, unspecified, not intractable, without status epilepticus: Secondary | ICD-10-CM | POA: Diagnosis not present

## 2019-11-25 DIAGNOSIS — E1142 Type 2 diabetes mellitus with diabetic polyneuropathy: Secondary | ICD-10-CM | POA: Diagnosis not present

## 2019-11-25 DIAGNOSIS — I110 Hypertensive heart disease with heart failure: Secondary | ICD-10-CM | POA: Diagnosis not present

## 2019-11-25 DIAGNOSIS — K529 Noninfective gastroenteritis and colitis, unspecified: Secondary | ICD-10-CM | POA: Diagnosis not present

## 2019-11-25 DIAGNOSIS — I509 Heart failure, unspecified: Secondary | ICD-10-CM | POA: Diagnosis not present

## 2019-11-25 DIAGNOSIS — Q759 Congenital malformation of skull and face bones, unspecified: Secondary | ICD-10-CM | POA: Diagnosis not present

## 2019-11-25 DIAGNOSIS — F0280 Dementia in other diseases classified elsewhere without behavioral disturbance: Secondary | ICD-10-CM | POA: Diagnosis not present

## 2019-11-25 DIAGNOSIS — G309 Alzheimer's disease, unspecified: Secondary | ICD-10-CM | POA: Diagnosis not present

## 2019-11-25 DIAGNOSIS — G40909 Epilepsy, unspecified, not intractable, without status epilepticus: Secondary | ICD-10-CM | POA: Diagnosis not present

## 2019-11-25 DIAGNOSIS — E559 Vitamin D deficiency, unspecified: Secondary | ICD-10-CM | POA: Diagnosis not present

## 2019-11-27 DIAGNOSIS — E1142 Type 2 diabetes mellitus with diabetic polyneuropathy: Secondary | ICD-10-CM | POA: Diagnosis not present

## 2019-11-27 DIAGNOSIS — F0280 Dementia in other diseases classified elsewhere without behavioral disturbance: Secondary | ICD-10-CM | POA: Diagnosis not present

## 2019-11-27 DIAGNOSIS — K529 Noninfective gastroenteritis and colitis, unspecified: Secondary | ICD-10-CM | POA: Diagnosis not present

## 2019-11-27 DIAGNOSIS — G309 Alzheimer's disease, unspecified: Secondary | ICD-10-CM | POA: Diagnosis not present

## 2019-11-27 DIAGNOSIS — I110 Hypertensive heart disease with heart failure: Secondary | ICD-10-CM | POA: Diagnosis not present

## 2019-11-27 DIAGNOSIS — Q759 Congenital malformation of skull and face bones, unspecified: Secondary | ICD-10-CM | POA: Diagnosis not present

## 2019-11-27 DIAGNOSIS — E559 Vitamin D deficiency, unspecified: Secondary | ICD-10-CM | POA: Diagnosis not present

## 2019-11-27 DIAGNOSIS — G40909 Epilepsy, unspecified, not intractable, without status epilepticus: Secondary | ICD-10-CM | POA: Diagnosis not present

## 2019-11-27 DIAGNOSIS — I509 Heart failure, unspecified: Secondary | ICD-10-CM | POA: Diagnosis not present

## 2019-12-02 DIAGNOSIS — G40909 Epilepsy, unspecified, not intractable, without status epilepticus: Secondary | ICD-10-CM | POA: Diagnosis not present

## 2019-12-02 DIAGNOSIS — F0280 Dementia in other diseases classified elsewhere without behavioral disturbance: Secondary | ICD-10-CM | POA: Diagnosis not present

## 2019-12-02 DIAGNOSIS — I509 Heart failure, unspecified: Secondary | ICD-10-CM | POA: Diagnosis not present

## 2019-12-02 DIAGNOSIS — E559 Vitamin D deficiency, unspecified: Secondary | ICD-10-CM | POA: Diagnosis not present

## 2019-12-02 DIAGNOSIS — Q759 Congenital malformation of skull and face bones, unspecified: Secondary | ICD-10-CM | POA: Diagnosis not present

## 2019-12-02 DIAGNOSIS — K529 Noninfective gastroenteritis and colitis, unspecified: Secondary | ICD-10-CM | POA: Diagnosis not present

## 2019-12-02 DIAGNOSIS — I110 Hypertensive heart disease with heart failure: Secondary | ICD-10-CM | POA: Diagnosis not present

## 2019-12-02 DIAGNOSIS — E1142 Type 2 diabetes mellitus with diabetic polyneuropathy: Secondary | ICD-10-CM | POA: Diagnosis not present

## 2019-12-02 DIAGNOSIS — G309 Alzheimer's disease, unspecified: Secondary | ICD-10-CM | POA: Diagnosis not present

## 2019-12-05 ENCOUNTER — Telehealth: Payer: Self-pay

## 2019-12-05 MED ORDER — LOPERAMIDE HCL 2 MG PO CAPS: 2.0000 mg | ORAL_CAPSULE | ORAL | 3 refills | Status: DC | PRN

## 2019-12-05 NOTE — Telephone Encounter (Signed)
Pt's husband, [name], was in today for coumadin clinic and brought an empty bottle of medicine, loperamide, 2mg  capsules. was prescribed this at his last hospital stay and reports he never needed it but his wife uses them occasionally. [name] denies any acute diarrhea now but said this has worked great for his wife in the past. Advised this is the same strength as OTC but he explained it is cheaper if a prescription. He is requesting a script be written for his wife. Advised a msg would be sent to Dr. [name] and this office would f/u. [name] was [name].

## 2019-12-05 NOTE — Telephone Encounter (Signed)
Spoke with pt notifying him rx was sent to [facility].  Pt expresses his thanks.

## 2019-12-05 NOTE — Telephone Encounter (Signed)
Imodium sent in for patient to [facility] pharmacy.

## 2019-12-09 DIAGNOSIS — F0280 Dementia in other diseases classified elsewhere without behavioral disturbance: Secondary | ICD-10-CM | POA: Diagnosis not present

## 2019-12-09 DIAGNOSIS — G309 Alzheimer's disease, unspecified: Secondary | ICD-10-CM | POA: Diagnosis not present

## 2019-12-09 DIAGNOSIS — K529 Noninfective gastroenteritis and colitis, unspecified: Secondary | ICD-10-CM | POA: Diagnosis not present

## 2019-12-09 DIAGNOSIS — I509 Heart failure, unspecified: Secondary | ICD-10-CM | POA: Diagnosis not present

## 2019-12-09 DIAGNOSIS — Q759 Congenital malformation of skull and face bones, unspecified: Secondary | ICD-10-CM | POA: Diagnosis not present

## 2019-12-09 DIAGNOSIS — G40909 Epilepsy, unspecified, not intractable, without status epilepticus: Secondary | ICD-10-CM | POA: Diagnosis not present

## 2019-12-09 DIAGNOSIS — I110 Hypertensive heart disease with heart failure: Secondary | ICD-10-CM | POA: Diagnosis not present

## 2019-12-09 DIAGNOSIS — E559 Vitamin D deficiency, unspecified: Secondary | ICD-10-CM | POA: Diagnosis not present

## 2019-12-09 DIAGNOSIS — E1142 Type 2 diabetes mellitus with diabetic polyneuropathy: Secondary | ICD-10-CM | POA: Diagnosis not present

## 2019-12-11 DIAGNOSIS — I509 Heart failure, unspecified: Secondary | ICD-10-CM | POA: Diagnosis not present

## 2019-12-11 DIAGNOSIS — K529 Noninfective gastroenteritis and colitis, unspecified: Secondary | ICD-10-CM | POA: Diagnosis not present

## 2019-12-11 DIAGNOSIS — F0280 Dementia in other diseases classified elsewhere without behavioral disturbance: Secondary | ICD-10-CM | POA: Diagnosis not present

## 2019-12-11 DIAGNOSIS — I110 Hypertensive heart disease with heart failure: Secondary | ICD-10-CM | POA: Diagnosis not present

## 2019-12-11 DIAGNOSIS — G40909 Epilepsy, unspecified, not intractable, without status epilepticus: Secondary | ICD-10-CM | POA: Diagnosis not present

## 2019-12-11 DIAGNOSIS — E559 Vitamin D deficiency, unspecified: Secondary | ICD-10-CM | POA: Diagnosis not present

## 2019-12-11 DIAGNOSIS — G309 Alzheimer's disease, unspecified: Secondary | ICD-10-CM | POA: Diagnosis not present

## 2019-12-11 DIAGNOSIS — Q759 Congenital malformation of skull and face bones, unspecified: Secondary | ICD-10-CM | POA: Diagnosis not present

## 2019-12-11 DIAGNOSIS — E1142 Type 2 diabetes mellitus with diabetic polyneuropathy: Secondary | ICD-10-CM | POA: Diagnosis not present

## 2020-01-09 ENCOUNTER — Telehealth: Payer: Self-pay

## 2020-01-09 MED ORDER — LEVOTHYROXINE SODIUM 25 MCG PO TABS: 25.0000 ug | ORAL_TABLET | Freq: Every day | ORAL | 1 refills | Status: DC

## 2020-01-09 NOTE — Telephone Encounter (Signed)
Pt's husband requesting refills of levothyhroxine   Last apt 11/11/19 Next apt 02/24/20  Sent in script

## 2020-01-15 NOTE — Chronic Care Management (AMB) (Deleted)
Chronic Care Management Pharmacy  Name: Leah Payne  MRN: [id number] DOB: 1945-11-02    Chief Complaint/ HPI  Leah Payne,  74 y.o. , female presents for their {Initial/Follow-up:3041532} CCM visit with the clinical pharmacist {CHL HP Upstream Pharm visit CZYS:[id number]}.  PCP : [name], MD  Their chronic conditions include: HTN, HLD, type 2 diabetes, hypothyroidism, alzheimer disease, WPW, overactive bladder, seizure disorder  Office Visits: 09/19/19 OV - rehab f/u visit. DM2 stable on metformin once daily. Due for an eye exam,. OAB stable on [place] and myrbetriq. Imbalance - HH involved. Alzheimer disease - continue on aricept 5 mg. F/u in 5 months for annual exam.   Consult Visit: Weekly home health visits  Medications: Outpatient Encounter Medications as of 01/16/2020  Medication Sig Note  . Accu-Chek FastClix Lancets MISC Use as instructed to check blood sugar once daily   . acetaminophen (TYLENOL) 500 MG tablet Take 1 tablet (500 mg total) by mouth every 8 (eight) hours as needed.   . Alcohol Swabs (B-D SINGLE USE SWABS REGULAR) PADS Use as instructed to check blood sugar once daily   . atorvastatin (LIPITOR) 40 MG tablet TAKE 1 TABLET EVERY DAY (Patient taking differently: Take 40 mg by mouth daily at 6 PM. TAKE 1 TABLET EVERY DAY) 07/26/2019: Patient states she does not think she takes this medication  . Blood Glucose Monitoring Suppl (ACCU-CHEK GUIDE) w/Device KIT 1 each by Does not apply route as directed. Use as instructed to check blood sugar once daily   . Cholecalciferol (VITAMIN D) 50 MCG (2000 UT) CAPS Take 1 capsule (2,000 Units total) by mouth daily.   Marland Kitchen donepezil (ARICEPT) 10 MG tablet Take 1 tablet (10 mg total) by mouth daily.   . fesoterodine ([name]) 8 MG TB24 tablet TAKE 1 TABLET (8 MG TOTAL) BY MOUTH DAILY.   Marland Kitchen glucose blood (ACCU-CHEK GUIDE) test strip Use as instructed to check blood sugar once daily   . levETIRAcetam (KEPPRA) 500 MG  tablet TAKE 1 TABLET TWICE DAILY (Patient taking differently: Take 500 mg by mouth 2 (two) times daily. ) 07/26/2019: Patient states she does not think she takes this medicaiton  . levothyroxine (SYNTHROID) 25 MCG tablet Take 1 tablet (25 mcg total) by mouth daily before breakfast.   . loperamide (IMODIUM A-D) 2 MG capsule Take 1 capsule (2 mg total) by mouth as needed for diarrhea or loose stools.   Marland Kitchen loratadine (CLARITIN) 10 MG tablet Take 1 tablet (10 mg total) by mouth daily.   . meclizine (ANTIVERT) 25 MG tablet TAKE 1/2 TO 1 TABLET TWICE DAILY AS NEEDED FOR DIZZINESS (Patient taking differently: Take 12.5-25 mg by mouth 2 (two) times daily as needed for dizziness. )   . metFORMIN (GLUCOPHAGE) 500 MG tablet TAKE 1 TABLET EVERY DAY WITH BREAKFAST (Patient taking differently: Take 500 mg by mouth daily with breakfast. )   . mirabegron ER ([name]) 50 MG TB24 tablet Take 1 tablet (50 mg total) by mouth at bedtime.   . propranolol (INDERAL) 10 MG tablet TAKE 1 TABLET TWICE DAILY (Patient taking differently: Take 10 mg by mouth 2 (two) times daily. )   . sertraline (ZOLOFT) 100 MG tablet Take 1 tablet (100 mg total) by mouth daily.   . Zinc Sulfate 140 (50 Zn) MG TABS Take 1 tablet by mouth daily.    No facility-administered encounter medications on file as of 01/16/2020.     Current Diagnosis/Assessment:  Goals Addressed   None  Hypertension   BP today is:  {CHL HP UPSTREAM Pharmacist BP [phone]}  Office blood pressures are  BP Readings from Last 3 Encounters:  09/19/19 136/80  07/31/19 (!) 161/96  05/31/19 (!) 152/88    Patient has failed these meds in the past: none Patient is currently {CHL Controlled/Uncontrolled:[phone]} on the following medications:  . Propranolol 10 mg 1 tablet twice daily  Patient checks BP at home {CHL HP BP Monitoring Frequency:[phone]}  Patient home BP readings are ranging: ***  We discussed {CHL HP Upstream Pharmacy  discussion:[id number]}  Plan  Continue {CHL HP Upstream Pharmacy [phone]}    Hyperlipidemia   Lipid Panel     Component Value Date/Time   CHOL 127 01/31/2019 0927   CHOL 146 03/27/2015 0000   TRIG 113.0 01/31/2019 0927   TRIG 215 03/27/2015 0000   HDL 46.80 01/31/2019 0927   CHOLHDL 3 01/31/2019 0927   VLDL 22.6 01/31/2019 0927   LDLCALC 58 01/31/2019 0927   LDLCALC 101 03/27/2015 0000   LDLDIRECT 100.0 09/17/2015 1522     The ASCVD Risk score (Goff DC Jr., et al., 2013) failed to calculate for the following reasons:   The valid total cholesterol range is 130 to 320 mg/dL   Patient has failed these meds in past: none Patient is currently {CHL Controlled/Uncontrolled:[phone]} on the following medications:  . Atorvastatin 40 mg 1 tablet once daily  We discussed:  {CHL HP Upstream Pharmacy discussion:[id number]}  Plan  Continue {CHL HP Upstream Pharmacy [phone]}   Diabetes   Recent Relevant Labs: Lab Results  Component Value Date/Time   HGBA1C 6.2 08/07/2019 12:00 AM   HGBA1C 6.2 01/31/2019 09:27 AM   HGBA1C 5.9 06/11/2018 09:22 AM   GFR 84.63 01/31/2019 09:27 AM   GFR 91.66 06/11/2018 09:22 AM   MICROALBUR 0.7 10/06/2017 08:49 AM   MICROALBUR <0.7 09/17/2015 03:22 PM     Checking BG: {CHL HP Blood Glucose Monitoring Frequency:[phone]}  Recent FBG Readings: *** Recent pre-meal BG readings: *** Recent 2hr PP BG readings:  *** Recent HS BG readings: ***  Patient has failed these meds in past: none Patient is currently {CHL Controlled/Uncontrolled:[phone]} on the following medications:  Marland Kitchen Metformin 500 mg 1 tablet once daily with breakfast  Last diabetic Eye exam:  Lab Results  Component Value Date/Time   HMDIABEYEEXA No Retinopathy 09/26/2016 12:00 AM    Last diabetic Foot exam: No results found for: HMDIABFOOTEX   We discussed: {CHL HP Upstream Pharmacy discussion:[id number]}  Plan  Continue {CHL HP Upstream Pharmacy  [phone]}    Alzheimer disease   Patient has failed these meds in past: none Patient is currently {CHL Controlled/Uncontrolled:[phone]} on the following medications:  . Donepezil 10 mg 1 tablet once daily . Sertraline 100 mg 1 tablet once daily  We discussed:  ***  Plan  Continue {CHL HP Upstream Pharmacy VOZDG:[id number]}    Overactive bladder   Patient has failed these meds in past: none Patient is currently {CHL Controlled/Uncontrolled:[phone]} on the following medications:  . [name] 8 mg 1 tablet once daily . [name] 50 mg 1 tablet once daily at bedtime  We discussed:  ***  Plan  Continue {CHL HP Upstream Pharmacy [phone]}    Seizure disorder   Patient has failed these meds in past: none Patient is currently {CHL Controlled/Uncontrolled:[phone]} on the following medications:  . Levetiracetam 500 mg 1 tablet twice a day  We discussed:  ***  Plan  Continue {CHL HP Upstream Pharmacy ZDGLO:[id number]}    Vitamin D Deficiency  Lab Results  Component Value Date   VD25OH 57.6 08/07/2019   Patient has failed these meds in past: *** Patient is currently {CHL Controlled/[phone]} on the following medications:   Vitamin D 2000 units 1 capsule daily  We discussed:  ***  Plan  Continue {CHL HP Upstream Pharmacy Plans:[phone]}    Hypothyroidism   Lab Results  Component Value Date/Time   TSH 3.24 08/07/2019 12:00 AM   TSH 1.740 07/26/2019 10:07 AM   TSH 1.95 01/31/2019 09:27 AM   TSH 2.49 09/29/2017 09:11 AM   FREET4 0.91 01/31/2019 09:27 AM    Patient has failed these meds in past: *** Patient is currently {CHL Controlled/[phone]} on the following medications:  . Levothyroxine 25 mcg 1 tablet once daily before breakfast  We discussed:  {CHL HP Upstream Pharmacy [phone]}  Plan  Continue {CHL HP Upstream Pharmacy VFIEP:[id number]}   OTCs/Health Maintenance   Patient  is currently {CHL Controlled/[phone]} on the following medications: . AccuCheck FastClix lancets to check blood sugar once daily . APAP 500 mg 1 tablet every 8 hrs as needed . AccuCheck Guide monitor and test strips to use once daily . Loperamide 2 mg 1 capsule as needed for diarrhea or loose stools . Loratadine 10 mg 1 tablet once daily . Meclizine 25 mg 0.5-1 tablet twice daily as needed for dizziness . Zinc sulfate 140 mg 1 tablet daily  We discussed:  ***  Plan  Continue {CHL HP Upstream Pharmacy SAYTK:[id number]}   Vaccines   Reviewed and discussed patient's vaccination history.    Immunization History  Administered Date(s) Administered  . Fluad Quad(high Dose 65+) 04/30/2019  . Influenza, High Dose Seasonal PF 03/31/2017  . Influenza,inj,Quad PF,6+ Mos 03/23/2016, 04/05/2018  . Influenza-Unspecified 03/26/2015  . Pneumococcal Conjugate-13 08/25/2016  . Pneumococcal Polysaccharide-23 03/16/2015    Plan  Recommended patient receive *** vaccine in *** office.    Medication Management   Pharmacy/Benefits: [employer] [facility] Adherence:  Pt endorses ***% compliance  We discussed: ***  Plan  {US Pharmacy TDDU:20254}   Follow up: *** month phone visit   TIMER :  [id number] Kieley L. Beauchaine  01/00/1900 [place] 0 Yes CPP Chart prep 15:35:26 16:04:42 0:29:16        .

## 2020-01-16 ENCOUNTER — Ambulatory Visit: Payer: Medicare HMO

## 2020-01-16 ENCOUNTER — Telehealth: Payer: Self-pay

## 2020-01-16 DIAGNOSIS — I10 Essential (primary) hypertension: Secondary | ICD-10-CM

## 2020-01-16 DIAGNOSIS — E1142 Type 2 diabetes mellitus with diabetic polyneuropathy: Secondary | ICD-10-CM

## 2020-01-16 DIAGNOSIS — N3281 Overactive bladder: Secondary | ICD-10-CM

## 2020-01-16 NOTE — Chronic Care Management (AMB) (Signed)
Chronic Care Management Pharmacy  Name: Leah Payne  MRN: [id number] DOB: 1946-05-20    Chief Complaint/ HPI  Leah Payne,  74 y.o. , female presents for their Initial CCM visit with the clinical pharmacist In office. Patient presented with her husband, [name].   PCP : [name], MD  Their chronic conditions include: HTN, HLD, type 2 diabetes, hypothyroidism, alzheimer disease, WPW, overactive bladder, seizure disorder  Office Visits: 09/19/19 OV - rehab f/u visit. DM2 stable on metformin once daily. Due for an eye exam, OAB stable on [place] and myrbetriq. Imbalance, home health involved. Alzheimer disease - continue on aricept 5 mg. F/u in 5 months for annual exam.   Consult Visit: Weekly home health visits  Medications: Outpatient Encounter Medications as of 01/16/2020  Medication Sig Note  . Accu-Chek FastClix Lancets MISC Use as instructed to check blood sugar once daily   . acetaminophen (TYLENOL) 500 MG tablet Take 1 tablet (500 mg total) by mouth every 8 (eight) hours as needed.   . Alcohol Swabs (B-D SINGLE USE SWABS REGULAR) PADS Use as instructed to check blood sugar once daily   . atorvastatin (LIPITOR) 40 MG tablet TAKE 1 TABLET EVERY DAY (Patient taking differently: Take 40 mg by mouth daily at 6 PM. TAKE 1 TABLET EVERY DAY) 07/26/2019: Patient states she does not think she takes this medication  . Blood Glucose Monitoring Suppl (ACCU-CHEK GUIDE) w/Device KIT 1 each by Does not apply route as directed. Use as instructed to check blood sugar once daily   . Cholecalciferol (VITAMIN D) 50 MCG (2000 UT) CAPS Take 1 capsule (2,000 Units total) by mouth daily.   Marland Kitchen donepezil (ARICEPT) 10 MG tablet Take 1 tablet (10 mg total) by mouth daily.   Marland Kitchen glucose blood (ACCU-CHEK GUIDE) test strip Use as instructed to check blood sugar once daily   . levothyroxine (SYNTHROID) 25 MCG tablet Take 1 tablet (25 mcg total) by mouth daily before breakfast.   . loperamide  (IMODIUM A-D) 2 MG capsule Take 1 capsule (2 mg total) by mouth as needed for diarrhea or loose stools.   Marland Kitchen loratadine (CLARITIN) 10 MG tablet Take 1 tablet (10 mg total) by mouth daily.   . meclizine (ANTIVERT) 25 MG tablet TAKE 1/2 TO 1 TABLET TWICE DAILY AS NEEDED FOR DIZZINESS (Patient taking differently: Take 12.5-25 mg by mouth 2 (two) times daily as needed for dizziness. )   . metFORMIN (GLUCOPHAGE) 500 MG tablet TAKE 1 TABLET EVERY DAY WITH BREAKFAST (Patient taking differently: Take 500 mg by mouth daily with breakfast. )   . mirabegron ER ([name]) 50 MG TB24 tablet Take 1 tablet (50 mg total) by mouth at bedtime.   . propranolol (INDERAL) 10 MG tablet TAKE 1 TABLET TWICE DAILY (Patient taking differently: Take 10 mg by mouth 2 (two) times daily. )   . sertraline (ZOLOFT) 100 MG tablet Take 1 tablet (100 mg total) by mouth daily.   . Zinc Sulfate 140 (50 Zn) MG TABS Take 1 tablet by mouth daily.   . [DISCONTINUED] fesoterodine ([name]) 8 MG TB24 tablet TAKE 1 TABLET (8 MG TOTAL) BY MOUTH DAILY.   . [DISCONTINUED] levETIRAcetam (KEPPRA) 500 MG tablet TAKE 1 TABLET TWICE DAILY (Patient taking differently: Take 500 mg by mouth 2 (two) times daily. ) 07/26/2019: Patient states she does not think she takes this medicaiton   No facility-administered encounter medications on file as of 01/16/2020.   Current Diagnosis/Assessment:  SDOH Interventions  Most Recent Value  SDOH Interventions  Financial Strain Interventions Intervention Not Indicated     Goals Addressed            This Visit's Progress   . Chronic Care Management       CARE PLAN ENTRY  Current Barriers:  . Chronic Disease Management support, education, and care coordination needs related to Hypertension, Diabetes, and Overactive Bladder   Hypertension BP Readings from Last 3 Encounters:  09/19/19 136/80  07/31/19 (!) 161/96  05/31/19 (!) 152/88 .  Pharmacist Clinical Goal(s): o Over the next 3 months, patient  will work with PharmD and providers to maintain BP goal <140/90 mmHg . Current regimen:  o Propranolol 10 mg - 1 tablet twice daily . Interventions: o Recommend home blood pressure monitoring  . Patient self care activities - Over the next 3 months, patient will: o Check BP twice weekly, document, and provide at future appointments o Ensure daily salt intake < 2300 mg/day  Diabetes Lab Results  Component Value Date/Time   HGBA1C 6.2 08/07/2019 12:00 AM   HGBA1C 6.2 01/31/2019 09:27 AM   HGBA1C 5.9 06/11/2018 09:22 AM .  Pharmacist Clinical Goal(s): o Over the next 3 months, patient will work with PharmD and providers to maintain A1c goal <7% . Current regimen:  o Metformin 500 mg -  1 tablet once daily with breakfast . Interventions: o Recommend annual foot and eye exam  . Patient self care activities - Over the next 3 months, patient will: o Check blood sugar once daily, document, and provide at future appointments o Contact provider with any episodes of hypoglycemia o Schedule annual eye and foot exam   Overactive Bladder . Pharmacist Clinical Goal(s) o Over the next 3 months, patient will work with PharmD and providers to improve urinary retention . Current regimen:  . [name] 8 mg - 1 tablet once daily . [name] 50 mg - 1 tablet once daily at bedtime . Interventions: o Consult with Dr. Danise Mina for medication management - trial off [name] . Patient self care activities - Over the next 3 months, patient will: o Please hold [name] to see if urinary retention resolves  o If worsening urinary retention/abdominal pain, contact office or urgent care   Medication management . Pharmacist Clinical Goal(s): o Over the next 3 months, patient will work with PharmD and providers to achieve optimal medication adherence . Current pharmacy: [facility] . Interventions o Comprehensive medication review performed. o Utilize [facility] for medication synchronization,  packaging and delivery . Patient self care activities - Over the next 3 months, patient will: o Begin pill packaging for all medications o Call Sharyn Lull any questions or concerns regarding [facility] or medication refills   Initial goal documentation      Hypertension   Office blood pressures are  BP Readings from Last 3 Encounters:  09/19/19 136/80  07/31/19 (!) 161/96  05/31/19 (!) 152/88   BP goal < 140/90 mmHg Patient has failed these meds in the past: none Patient is currently controlled on the following medications:  . Propranolol 10 mg - 1 tablet twice daily  Patient checks BP at home: no home monitor Patient home BP readings are ranging: none reported  Pertinent history: Per chart review, h/o tachycardia with WPW  Plan: Continue current medications   Hyperlipidemia   Lipid Panel     Component Value Date/Time   CHOL 127 01/31/2019 0927   CHOL 146 03/27/2015 0000   TRIG 113.0 01/31/2019 0927  TRIG 215 03/27/2015 0000   HDL 46.80 01/31/2019 0927   CHOLHDL 3 01/31/2019 0927   VLDL 22.6 01/31/2019 0927   LDLCALC 58 01/31/2019 0927   LDLCALC 101 03/27/2015 0000   LDLDIRECT 100.0 09/17/2015 1522    The ASCVD Risk score (Goff DC Jr., et al., 2013) failed to calculate for the following reasons:   The valid total cholesterol range is 130 to 320 mg/dL   LDL goal < 100 Patient has failed these meds in past: none Patient is currently controlled on the following medications:  . Atorvastatin 40 mg - 1 tablet once daily  We discussed: confirms adherence, denies concerns   Pla: Continue current medications  Diabetes   Recent Relevant Labs: Lab Results  Component Value Date/Time   HGBA1C 6.2 08/07/2019 12:00 AM   HGBA1C 6.2 01/31/2019 09:27 AM   HGBA1C 5.9 06/11/2018 09:22 AM   GFR 84.63 01/31/2019 09:27 AM   GFR 91.66 06/11/2018 09:22 AM   MICROALBUR 0.7 10/06/2017 08:49 AM   MICROALBUR <0.7 09/17/2015 03:22 PM    Checking BG: Daily Recent FBG Readings:  100s  Patient has failed these meds in past: none Patient is currently controlled on the following medications:  Marland Kitchen Metformin 500 mg -  1 tablet once daily with breakfast  We discussed: Due for eye and foot exam   Plan: Continue current medications   Alzheimer disease   Patient has failed these meds in past: none Patient is currently controlled on the following medications:  . Donepezil 10 mg - 1 tablet once daily . Sertraline 100 mg - 1 tablet once daily  We discussed:  Takes donepezil every morning. Denies nausea, does report some diarrhea (once weekly - takes Imodium). Reports doing well on both sertraline and donepezil, memory and mood stable.   Plan: Continue current medications   Overactive bladder   Patient has failed these meds in past: none Patient is currently uncontrolled on the following medications:  . [name] 8 mg - 1 tablet once daily . [name] 50 mg - 1 tablet once daily at bedtime  We discussed: Pt reports difficulty urinating. May take up to 30 minutes to void and often feels like she has to urinate but unable. She has been on dual therapy > 5 years. Her and her husband would like to know if she could trial monotherapy.  Plan: Consult with PCP regarding trial off [name] due to urinary retention symptoms.    Seizure disorder   Patient has failed these meds in past: none Patient is currently controlled on the following medications:  . Levetiracetam 500 mg - 1 tablet twice a day  We discussed: reports seizure free since January 2015; due for annual visit with neurology   Plan: Continue current medications   Vitamin D Deficiency   Lab Results  Component Value Date   VD25OH 57.6 08/07/2019   Patient has failed these meds in past: none Patient is currently controlled on the following medications:   Vitamin D 2000 units - 1 capsule daily  We discussed: currently out of medication, encouraged to resume  Plan: Continue current medications    Hypothyroidism   Lab Results  Component Value Date/Time   TSH 3.24 08/07/2019 12:00 AM   TSH 1.740 07/26/2019 10:07 AM   TSH 1.95 01/31/2019 09:27 AM   TSH 2.49 09/29/2017 09:11 AM   FREET4 0.91 01/31/2019 09:27 AM   Patient has failed these meds in past: none  Patient is currently controlled on the following medications:  .  Levothyroxine 25 mcg  - 1 tablet daily   We discussed:  Confirmed adherence, denies concerns. TSH WNL despite taking at same time as other morning medications, may continue current administration time.   Plan: Continue current medications  OTCs/Health Maintenance   Patient is currently on the following medications:  Accu Chek Guide monitor  Accu Chek Test strips  . Accu Chek FastClix lancets . Loratadine 10 mg - 1 tablet once daily   PRN use: Marland Kitchen Tylenol 500 mg - 1 tablet every 8 hrs as needed (occasional use) . Loperamide 2 mg - 1 capsule as needed for diarrhea or loose stools (uses about once/week)  Not taking:  Zinc sulfate 140 mg - 1 tablet daily  Meclizine 25 mg - 0.5 to 1 tablet twice daily PRN dizziness   We discussed: appropriate to continue above medications   Plan: Continue current medications   Vaccines   Reviewed and discussed patient's vaccination history.    Immunization History  Administered Date(s) Administered  . Fluad Quad(high Dose 65+) 04/30/2019  . Influenza, High Dose Seasonal PF 03/31/2017  . Influenza,inj,Quad PF,6+ Mos 03/23/2016, 04/05/2018  . Influenza-Unspecified 03/26/2015  . Pneumococcal Conjugate-13 08/25/2016  . Pneumococcal Polysaccharide-23 03/16/2015   Plan: Recommended patient receive Shingrix   Medication Management   Pharm[facility]Benefits[facility] ([facility] effective 01/23/20) Adherence: per review of medications brought into office, multiple pill bottles were expired or past due for refill   We discussed: Offered medication coordination services to improve adherence and  safety Patient would like adherence packaging, 30 DS  Plan: Verbal consent obtained for [facility] enhanced pharmacy services (medication synchronization, adherence packaging, delivery coordination). A medication sync plan was created to allow patient to get all medications delivered once every 30 to 90 days per patient preference. Patient understands they have freedom to choose pharmacy and clinical pharmacist will coordinate care between all prescribers and [facility].  Follow up: 6 month phone visit  Debbora Dus, PharmD Clinical Pharmacist [facility] at [facility] [phone]     .

## 2020-01-16 NOTE — Telephone Encounter (Signed)
Per written referral from PCP, requesting referral in Epic for Leah Payne to chronic care management pharmacy services for the following conditions:   Essential hypertension, benign  [I10]  Type 2 diabetes, controlled, with peripheral neuropathy [E11.42]  Phil Dopp, PharmD Clinical Pharmacist [facility] at [facility] [phone]

## 2020-01-30 ENCOUNTER — Telehealth: Payer: Self-pay

## 2020-01-30 NOTE — Telephone Encounter (Signed)
PCP consult regarding CCM visit 01/16/20:  Pt reports difficulty urinating. Reports trying for up to 30 minutes to void and often unable. She denies any urinary incontinence at this time. Her and her husband would like to know if she could trial off of one of her medications to see if urinary retention improves. Recommend trial off [name] pending PCP approval.   Patient is currently on the following medications:  . [name] 8 mg - 1 tablet once daily . [name] 50 mg - 1 tablet once daily at bedtime  Phil Dopp, PharmD Clinical Pharmacist [facility] at [facility] [phone]

## 2020-01-30 NOTE — Patient Instructions (Addendum)
Dear Leah Payne,  It was a pleasure meeting you during our initial appointment on January 16, 2020. Below is a summary of the goals we discussed and components of chronic care management. Please contact me anytime with questions or concerns.   Visit Information  Goals Addressed            This Visit's Progress   . Chronic Care Management       CARE PLAN ENTRY  Current Barriers:  . Chronic Disease Management support, education, and care coordination needs related to Hypertension, Diabetes, and Overactive Bladder   Hypertension BP Readings from Last 3 Encounters:  09/19/19 136/80  07/31/19 (!) 161/96  05/31/19 (!) 152/88 .  Pharmacist Clinical Goal(s): o Over the next 3 months, patient will work with PharmD and providers to maintain BP goal <140/90 mmHg . Current regimen:  o Propranolol 10 mg - 1 tablet twice daily . Interventions: o Recommend home blood pressure monitoring  . Patient self care activities - Over the next 3 months, patient will: o Check BP twice weekly, document, and provide at future appointments o Ensure daily salt intake < 2300 mg/day  Diabetes Lab Results  Component Value Date/Time   HGBA1C 6.2 08/07/2019 12:00 AM   HGBA1C 6.2 01/31/2019 09:27 AM   HGBA1C 5.9 06/11/2018 09:22 AM .  Pharmacist Clinical Goal(s): o Over the next 3 months, patient will work with PharmD and providers to maintain A1c goal <7% . Current regimen:  o Metformin 500 mg -  1 tablet once daily with breakfast . Interventions: o Recommend annual foot and eye exam  . Patient self care activities - Over the next 3 months, patient will: o Check blood sugar once daily, document, and provide at future appointments o Contact provider with any episodes of hypoglycemia o Schedule annual eye and foot exam   Overactive Bladder . Pharmacist Clinical Goal(s) o Over the next 3 months, patient will work with PharmD and providers to improve urinary retention . Current regimen:  . [name]  8 mg - 1 tablet once daily . [name] 50 mg - 1 tablet once daily at bedtime . Interventions: o Consult with Dr. Sharen Hones for medication management - trial off [name] . Patient self care activities - Over the next 3 months, patient will: o Please hold [name] to see if urinary retention resolves  o If worsening urinary retention/abdominal pain, contact office or urgent care   Medication management . Pharmacist Clinical Goal(s): o Over the next 3 months, patient will work with PharmD and providers to achieve optimal medication adherence . Current pharmacy: [facility] . Interventions o Comprehensive medication review performed. o Utilize [facility] for medication synchronization, packaging and delivery . Patient self care activities - Over the next 3 months, patient will: o Begin pill packaging for all medications o Call Marcelino Duster any questions or concerns regarding [facility] or medication refills   Initial goal documentation      Leah Payne was given information about Chronic Care Management services today including:  1. CCM service includes personalized support from designated clinical staff supervised by her physician, including individualized plan of care and coordination with other care providers 2. 24/7 contact phone numbers for assistance for urgent and routine care needs. 3. Standard insurance, coinsurance, copays and deductibles apply for chronic care management only during months in which we provide at least 20 minutes of these services. Most insurances cover these services at 100%, however patients may be responsible for any copay, coinsurance and/or deductible  if applicable. This service may help you avoid the need for more expensive face-to-face services. 4. Only one practitioner may furnish and bill the service in a calendar month. 5. The patient may stop CCM services at any time (effective at the end of the month) by phone call to the office staff.  Patient  agreed to services and verbal consent obtained.   Verbal consent obtained for [facility] enhanced pharmacy services (medication synchronization, adherence packaging, delivery coordination). A medication sync plan was created to allow patient to get all medications delivered once every 30 to 90 days per patient preference. Patient understands they have freedom to choose pharmacy and clinical pharmacist will coordinate care between all prescribers and [facility].  The patient verbalized understanding of instructions provided today and agreed to receive a mailed copy of patient instruction and/or educational materials. Telephone follow up appointment with pharmacy team member scheduled for: 07/13/20 at 9:30 AM (telephone)  Phil Dopp, PharmD Clinical Pharmacist [facility] at [facility] [phone]   Blood Pressure Record Sheet To take your blood pressure, you will need a blood pressure machine. You can buy a blood pressure machine (blood pressure monitor) at your clinic, drug store, or online. When choosing one, consider: An automatic monitor that has an arm cuff. A cuff that wraps snugly around your upper arm. You should be able to fit only one finger between your arm and the cuff. A device that stores blood pressure reading results. Do not choose a monitor that measures your blood pressure from your wrist or finger. Follow your health care provider's instructions for how to take your blood pressure. To use this form: Get one reading in the morning (a.m.) before you take any medicines. Get one reading in the evening (p.m.) before supper. Take at least 2 readings with each blood pressure check. This makes sure the results are correct. Wait 1-2 minutes between measurements. Write down the results in the spaces on this form. Repeat this once a week, or as told by your health care provider. Make a follow-up appointment with your health care provider to discuss the  results. Blood pressure log Date: _______________________ a.m. _____________________(1st reading) _____________________(2nd reading) p.m. _____________________(1st reading) _____________________(2nd reading) Date: _______________________ a.m. _____________________(1st reading) _____________________(2nd reading) p.m. _____________________(1st reading) _____________________(2nd reading) Date: _______________________ a.m. _____________________(1st reading) _____________________(2nd reading) p.m. _____________________(1st reading) _____________________(2nd reading) Date: _______________________ a.m. _____________________(1st reading) _____________________(2nd reading) p.m. _____________________(1st reading) _____________________(2nd reading) Date: _______________________ a.m. _____________________(1st reading) _____________________(2nd reading) p.m. _____________________(1st reading) _____________________(2nd reading) This information is not intended to replace advice given to you by your health care provider. Make sure you discuss any questions you have with your health care provider. Document Revised: 09/08/2017 Document Reviewed: 07/11/2017 Elsevier Patient Education  2020 [employer].

## 2020-01-31 ENCOUNTER — Encounter: Payer: Self-pay | Admitting: Family Medicine

## 2020-01-31 ENCOUNTER — Other Ambulatory Visit: Payer: Self-pay

## 2020-01-31 ENCOUNTER — Telehealth: Payer: Self-pay

## 2020-01-31 LAB — COLOGUARD

## 2020-01-31 NOTE — Telephone Encounter (Signed)
This has been addressed in another telephone encounter.  

## 2020-01-31 NOTE — Telephone Encounter (Signed)
Agree. Let's stop [name], update [place] with effect. Is she able to make urine? If worsening urinary retention/abdominal pain recommend urgent evaluation either here or at urgent care.

## 2020-01-31 NOTE — Telephone Encounter (Signed)
Pt's husband in today for coumadin clinic and requested refill for pt for keppra and toviaz sent to [facility] because they no longer have [facility] pharmacy. Advised a msg would be sent to PCP.  He report his wife cannot see Dr. Lucia Gaskins until September and he will not give her a refill until she is seen so he said to get Dr. Sharen Hones to fill it until then.

## 2020-01-31 NOTE — Telephone Encounter (Signed)
Noted  

## 2020-01-31 NOTE — Telephone Encounter (Signed)
[facility] Night - Client Nonclinical Telephone Record AccessNurse Client [facility] Night - Client Client Site  [facility] [place] - Night Physician Eustaquio Boyden - MD Contact Type Call Who Is Calling Patient / Member / Family / Caregiver Caller Name Zhuri Krass Caller Phone Number [phone] Patient Name Britanee Vanblarcom Patient DOB 1945-11-16 Call Type Message Only Information Provided Reason for Call Request for General Office Information Initial Comment Caller states his wife needs Leviathan refilled. Dr. Lucia Gaskins office said she can't see his wife until September so Dr. Sharen Hones can go ahead and fill it. Additional Comment Disp. Time Disposition Final User 01/30/2020 5:06:33 PM General Information Provided Yes Jowers, April Call Closed By: April Jowers Transaction Date/Time: 01/30/2020 5:02:10 PM (ET)

## 2020-02-01 MED ORDER — TOVIAZ 8 MG PO TB24: ORAL_TABLET | ORAL | 1 refills | Status: DC

## 2020-02-01 MED ORDER — LEVETIRACETAM 500 MG PO TABS: 500.0000 mg | ORAL_TABLET | Freq: Two times a day (BID) | ORAL | 0 refills | Status: DC

## 2020-02-03 NOTE — Telephone Encounter (Signed)
Reached patient by telephone today to discuss PCP recommendation to stop [name]. Per patient's husband, they stopped [name] a couple weeks ago and frequent urination/urgency worsened so they plan to resume [name] this week pending refill from pharmacy.   Phil Dopp, PharmD Clinical Pharmacist [facility] at [facility] [phone]

## 2020-02-03 NOTE — Telephone Encounter (Signed)
Noted plan to restart toviaz.

## 2020-02-05 ENCOUNTER — Telehealth: Payer: Self-pay

## 2020-02-05 DIAGNOSIS — N3281 Overactive bladder: Secondary | ICD-10-CM

## 2020-02-05 NOTE — Telephone Encounter (Signed)
Received faxed PA request from [facility] for [name] 8 mg tab.  Submitted PA; key:  [id number], PA case ID:  [id number], Rx #:  [id number].  Decision pending.

## 2020-02-11 NOTE — Telephone Encounter (Addendum)
Received denial from insurance for Leah Payne - as it's not on plan's formulary despite patient being stable on this medication for 5 years. I have submitted an appeal but if again denied, will need to try oxybutynin, solifenacin or tolterodine ER first.  (will place in my out box tomorrow)

## 2020-02-11 NOTE — Progress Notes (Signed)
I have collaborated with the care management provider regarding care management and care coordination activities outlined in this encounter and have reviewed this encounter including documentation in the note and care plan. I am certifying that I agree with the content of this note and encounter as supervising physician.  

## 2020-02-12 NOTE — Telephone Encounter (Signed)
Noted.  Faxed appeal.  Decision pending.

## 2020-02-14 ENCOUNTER — Other Ambulatory Visit: Payer: Self-pay | Admitting: Neurology

## 2020-02-19 ENCOUNTER — Ambulatory Visit: Payer: Medicare HMO

## 2020-02-19 ENCOUNTER — Other Ambulatory Visit: Payer: Self-pay | Admitting: Family Medicine

## 2020-02-19 DIAGNOSIS — G40909 Epilepsy, unspecified, not intractable, without status epilepticus: Secondary | ICD-10-CM

## 2020-02-19 DIAGNOSIS — E039 Hypothyroidism, unspecified: Secondary | ICD-10-CM

## 2020-02-19 DIAGNOSIS — E1142 Type 2 diabetes mellitus with diabetic polyneuropathy: Secondary | ICD-10-CM

## 2020-02-19 DIAGNOSIS — E559 Vitamin D deficiency, unspecified: Secondary | ICD-10-CM

## 2020-02-19 DIAGNOSIS — E785 Hyperlipidemia, unspecified: Secondary | ICD-10-CM

## 2020-02-20 ENCOUNTER — Other Ambulatory Visit: Payer: Self-pay

## 2020-02-20 ENCOUNTER — Other Ambulatory Visit (INDEPENDENT_AMBULATORY_CARE_PROVIDER_SITE_OTHER): Payer: Medicare Other

## 2020-02-20 DIAGNOSIS — E039 Hypothyroidism, unspecified: Secondary | ICD-10-CM | POA: Diagnosis not present

## 2020-02-20 DIAGNOSIS — E785 Hyperlipidemia, unspecified: Secondary | ICD-10-CM | POA: Diagnosis not present

## 2020-02-20 DIAGNOSIS — E1142 Type 2 diabetes mellitus with diabetic polyneuropathy: Secondary | ICD-10-CM | POA: Diagnosis not present

## 2020-02-20 DIAGNOSIS — E559 Vitamin D deficiency, unspecified: Secondary | ICD-10-CM

## 2020-02-20 DIAGNOSIS — G40909 Epilepsy, unspecified, not intractable, without status epilepticus: Secondary | ICD-10-CM

## 2020-02-20 LAB — LIPID PANEL
Cholesterol: 147 mg/dL (ref 0–200)
HDL: 46.5 mg/dL (ref >39.00)
LDL Cholesterol: 79 mg/dL (ref 0–99)
NonHDL: 100.51
Total CHOL/HDL Ratio: 3
Triglycerides: 106 mg/dL (ref 0.0–149.0)
VLDL: 21.2 mg/dL (ref 0.0–40.0)

## 2020-02-20 LAB — CBC WITH DIFFERENTIAL/PLATELET
Basophils Absolute: 0.1 10*3/uL (ref 0.0–0.1)
Basophils Relative: 1.1 % (ref 0.0–3.0)
Eosinophils Absolute: 0.2 10*3/uL (ref 0.0–0.7)
Eosinophils Relative: 3.5 % (ref 0.0–5.0)
HCT: 36.8 % (ref 36.0–46.0)
Hemoglobin: 12.4 g/dL (ref 12.0–15.0)
Lymphocytes Relative: 36.8 % (ref 12.0–46.0)
Lymphs Abs: 1.7 10*3/uL (ref 0.7–4.0)
MCHC: 33.6 g/dL (ref 30.0–36.0)
MCV: 85.2 fl (ref 78.0–100.0)
Monocytes Absolute: 0.5 10*3/uL (ref 0.1–1.0)
Monocytes Relative: 9.7 % (ref 3.0–12.0)
Neutro Abs: 2.3 10*3/uL (ref 1.4–7.7)
Neutrophils Relative %: 48.9 % (ref 43.0–77.0)
Platelets: 295 10*3/uL (ref 150.0–400.0)
RBC: 4.32 Mil/uL (ref 3.87–5.11)
RDW: 13.5 % (ref 11.5–15.5)
WBC: 4.7 10*3/uL (ref 4.0–10.5)

## 2020-02-20 LAB — COMPREHENSIVE METABOLIC PANEL
ALT: 11 U/L (ref 0–35)
AST: 13 U/L (ref 0–37)
Albumin: 4 g/dL (ref 3.5–5.2)
Alkaline Phosphatase: 72 U/L (ref 39–117)
BUN: 8 mg/dL (ref 6–23)
CO2: 28 mEq/L (ref 19–32)
Calcium: 9.2 mg/dL (ref 8.4–10.5)
Chloride: 101 mEq/L (ref 96–112)
Creatinine, Ser: 0.71 mg/dL (ref 0.40–1.20)
GFR: 80.28 mL/min (ref >60.00)
Glucose, Bld: 100 mg/dL — ABNORMAL HIGH (ref 70–99)
Potassium: 4.2 mEq/L (ref 3.5–5.1)
Sodium: 134 mEq/L — ABNORMAL LOW (ref 135–145)
Total Bilirubin: 0.4 mg/dL (ref 0.2–1.2)
Total Protein: 6.9 g/dL (ref 6.0–8.3)

## 2020-02-20 LAB — TSH: TSH: 2.84 u[IU]/mL (ref 0.35–4.50)

## 2020-02-20 LAB — VITAMIN D 25 HYDROXY (VIT D DEFICIENCY, FRACTURES): VITD: 39.1 ng/mL (ref 30.00–100.00)

## 2020-02-20 LAB — HEMOGLOBIN A1C: Hgb A1c MFr Bld: 6.3 % (ref 4.6–6.5)

## 2020-02-20 NOTE — Addendum Note (Signed)
Addended by: Alvina Chou on: 02/20/2020 11:24 AM   Modules accepted: Orders

## 2020-02-21 NOTE — Chronic Care Management (AMB) (Signed)
Patient's husband would like to know if Leah Payne has been approved. If not, they would like to go ahead and try an alternative. She is having a lot of urinary urgency and incontinence.   Phil Dopp, PharmD Clinical Pharmacist [facility] at [facility] [phone]

## 2020-02-24 ENCOUNTER — Encounter: Payer: Self-pay | Admitting: Family Medicine

## 2020-02-24 ENCOUNTER — Other Ambulatory Visit: Payer: Medicare Other

## 2020-02-24 ENCOUNTER — Other Ambulatory Visit: Payer: Self-pay

## 2020-02-24 ENCOUNTER — Ambulatory Visit (INDEPENDENT_AMBULATORY_CARE_PROVIDER_SITE_OTHER): Payer: Medicare Other | Admitting: Family Medicine

## 2020-02-24 VITALS — BP 124/78 | HR 72 | Temp 97.6°F | Ht 59.5 in | Wt 149.0 lb

## 2020-02-24 DIAGNOSIS — E1142 Type 2 diabetes mellitus with diabetic polyneuropathy: Secondary | ICD-10-CM | POA: Diagnosis not present

## 2020-02-24 DIAGNOSIS — N3281 Overactive bladder: Secondary | ICD-10-CM

## 2020-02-24 DIAGNOSIS — IMO0001 Reserved for inherently not codable concepts without codable children: Secondary | ICD-10-CM

## 2020-02-24 DIAGNOSIS — E1169 Type 2 diabetes mellitus with other specified complication: Secondary | ICD-10-CM

## 2020-02-24 DIAGNOSIS — H918X1 Other specified hearing loss, right ear: Secondary | ICD-10-CM

## 2020-02-24 DIAGNOSIS — G301 Alzheimer's disease with late onset: Secondary | ICD-10-CM

## 2020-02-24 DIAGNOSIS — F0281 Dementia in other diseases classified elsewhere with behavioral disturbance: Secondary | ICD-10-CM

## 2020-02-24 DIAGNOSIS — Z0000 Encounter for general adult medical examination without abnormal findings: Secondary | ICD-10-CM

## 2020-02-24 DIAGNOSIS — J3489 Other specified disorders of nose and nasal sinuses: Secondary | ICD-10-CM

## 2020-02-24 DIAGNOSIS — Z639 Problem related to primary support group, unspecified: Secondary | ICD-10-CM

## 2020-02-24 DIAGNOSIS — F02818 Dementia in other diseases classified elsewhere, unspecified severity, with other behavioral disturbance: Secondary | ICD-10-CM

## 2020-02-24 DIAGNOSIS — I10 Essential (primary) hypertension: Secondary | ICD-10-CM

## 2020-02-24 DIAGNOSIS — G40909 Epilepsy, unspecified, not intractable, without status epilepticus: Secondary | ICD-10-CM

## 2020-02-24 DIAGNOSIS — Q691 Accessory thumb(s): Secondary | ICD-10-CM

## 2020-02-24 DIAGNOSIS — E559 Vitamin D deficiency, unspecified: Secondary | ICD-10-CM

## 2020-02-24 DIAGNOSIS — Z7189 Other specified counseling: Secondary | ICD-10-CM

## 2020-02-24 DIAGNOSIS — Q039 Congenital hydrocephalus, unspecified: Secondary | ICD-10-CM

## 2020-02-24 DIAGNOSIS — E039 Hypothyroidism, unspecified: Secondary | ICD-10-CM

## 2020-02-24 LAB — MICROALBUMIN / CREATININE URINE RATIO
Creatinine,U: 44.6 mg/dL
Microalb Creat Ratio: 1.6 mg/g (ref 0.0–30.0)
Microalb, Ur: <0.7 mg/dL (ref 0.0–1.9)

## 2020-02-24 MED ORDER — AZELASTINE HCL 0.1 % NA SOLN: 1.0000 | Freq: Two times a day (BID) | NASAL | 12 refills | Status: DC

## 2020-02-24 MED ORDER — OXYBUTYNIN CHLORIDE ER 5 MG PO TB24: 5.0000 mg | ORAL_TABLET | Freq: Every day | ORAL | 6 refills | Status: DC

## 2020-02-24 NOTE — Patient Instructions (Addendum)
Start Oxybutynin XR 5mg  daily in place of [name].  Try astelin nasal spray for daily runny nose. If it's helping, we could try off claritin (allergy medicine).  We will sign you up for cologuard again - let or the company know if any questions.  Schedule eye exam as you had trouble with your vision test today.  We will refer you for hearing test  You are due for mammogram and bone density scan.  Check with local pharmacies about drive up COVID vaccines. May also check with [facility] [phone].  Return as needed or in 6 months for follow up visit.   Health Maintenance After Age 11 After age 75, you are at a higher risk for certain long-term diseases and infections as well as injuries from falls. Falls are a major cause of broken bones and head injuries in people who are older than age 21. Getting regular preventive care can help to keep you healthy and well. Preventive care includes getting regular testing and making lifestyle changes as recommended by your health care provider. Talk with your health care provider about:  Which screenings and tests you should have. A screening is a test that checks for a disease when you have no symptoms.  A diet and exercise plan that is right for you. What should I know about screenings and tests to prevent falls? Screening and testing are the best ways to find a health problem early. Early diagnosis and treatment give you the best chance of managing medical conditions that are common after age 59. Certain conditions and lifestyle choices may make you more likely to have a fall. Your health care provider may recommend:  Regular vision checks. Poor vision and conditions such as cataracts can make you more likely to have a fall. If you wear glasses, make sure to get your prescription updated if your vision changes.  Medicine review. Work with your health care provider to regularly review all of the medicines you are taking, including over-the-counter medicines.  Ask your health care provider about any side effects that may make you more likely to have a fall. Tell your health care provider if any medicines that you take make you feel dizzy or sleepy.  Osteoporosis screening. Osteoporosis is a condition that causes the bones to get weaker. This can make the bones weak and cause them to break more easily.  Blood pressure screening. Blood pressure changes and medicines to control blood pressure can make you feel dizzy.  Strength and balance checks. Your health care provider may recommend certain tests to check your strength and balance while standing, walking, or changing positions.  Foot health exam. Foot pain and numbness, as well as not wearing proper footwear, can make you more likely to have a fall.  Depression screening. You may be more likely to have a fall if you have a fear of falling, feel emotionally low, or feel unable to do activities that you used to do.  Alcohol use screening. Using too much alcohol can affect your balance and may make you more likely to have a fall. What actions can I take to lower my risk of falls? General instructions  Talk with your health care provider about your risks for falling. Tell your health care provider if: ? You fall. Be sure to tell your health care provider about all falls, even ones that seem minor. ? You feel dizzy, sleepy, or off-balance.  Take over-the-counter and prescription medicines only as told by your health care provider.  These include any supplements.  Eat a healthy diet and maintain a healthy weight. A healthy diet includes low-fat dairy products, low-fat (lean) meats, and fiber from whole grains, beans, and lots of fruits and vegetables. Home safety  Remove any tripping hazards, such as rugs, cords, and clutter.  Install safety equipment such as grab bars in bathrooms and safety rails on stairs.  Keep rooms and walkways well-lit. Activity   Follow a regular exercise program to stay  fit. This will help you maintain your balance. Ask your health care provider what types of exercise are appropriate for you.  If you need a cane or walker, use it as recommended by your health care provider.  Wear supportive shoes that have nonskid soles. Lifestyle  Do not drink alcohol if your health care provider tells you not to drink.  If you drink alcohol, limit how much you have: ? 0-1 drink a day for women. ? 0-2 drinks a day for men.  Be aware of how much alcohol is in your drink. In the [place] one drink equals one typical bottle of beer (12 oz), one-half glass of wine (5 oz), or one shot of hard liquor (1 oz).  Do not use any products that contain nicotine or tobacco, such as cigarettes and e-cigarettes. If you need help quitting, ask your health care provider. Summary  Having a healthy lifestyle and getting preventive care can help to protect your health and wellness after age 32.  Screening and testing are the best way to find a health problem early and help you avoid having a fall. Early diagnosis and treatment give you the best chance for managing medical conditions that are more common for people who are older than age 52.  Falls are a major cause of broken bones and head injuries in people who are older than age 48. Take precautions to prevent a fall at home.  Work with your health care provider to learn what changes you can make to improve your health and wellness and to prevent falls. This information is not intended to replace advice given to you by your health care provider. Make sure you discuss any questions you have with your health care provider. Document Revised: 11/01/2018 Document Reviewed: 05/24/2017 Elsevier Patient Education  2020 [employer].

## 2020-02-24 NOTE — Telephone Encounter (Signed)
Looks like Dr. [name] started pt on an alternative at today's wellness visit.

## 2020-02-24 NOTE — Telephone Encounter (Signed)
Received notice that insurance is approving [name]. However I sent in oxybutynin ER 5mg  to local pharmacy - would call patient/husband and see which they prefer to try toviaz or new oxybutynin ER - may have them price out both and then fill more affordable one, let know which they fill.

## 2020-02-24 NOTE — Progress Notes (Signed)
This visit was conducted in person.  BP 124/78 (BP Location: Left Arm, Patient Position: Sitting, Cuff Size: Normal)   Pulse 72   Temp 97.6 F (36.4 C) (Temporal)   Ht 4' 11.5" (1.511 m)   Wt 149 lb (67.6 kg)   SpO2 95%   BMI 29.59 kg/m     Hearing Screening   '125Hz'$  '250Hz'$  '500Hz'$  '1000Hz'$  '2000Hz'$  '3000Hz'$  '4000Hz'$  '6000Hz'$  '8000Hz'$   Right ear:   0 0 25  25    Left ear:   40 25 40  25      Visual Acuity Screening   Right eye Left eye Both eyes  Without correction: 20/70 20/100 20/70  With correction:      CC: AMW Subjective:    Patient ID: Leah Payne, female    DOB: 1946/07/23, 74 y.o.   MRN: [id number]  HPI: Leah ANASTAS is a 74 y.o. female presenting on 02/24/2020 for Medicare Wellness (Pt accompanied by husband, [name]- temp 97.7), Nasal Congestion (C/o runny nose about every evening.  Per husband, allergy med is no longer working. ), and Anorexia (Per husband, pt does not have much of appetite. )   Did not see health advisor    Hearing Screening   '125Hz'$  '250Hz'$  '500Hz'$  '1000Hz'$  '2000Hz'$  '3000Hz'$  '4000Hz'$  '6000Hz'$  '8000Hz'$   Right ear:   0 0 25  25    Left ear:   40 25 40  25      Visual Acuity Screening   Right eye Left eye Both eyes  Without correction: 20/70 20/100 20/70  With correction:         Office Visit from 02/24/2020 in [place] at [facility]  PHQ-2 Total Score 0      Fall Risk  02/24/2020 01/07/2019 03/27/2018 09/29/2017 09/23/2016  Falls in the past year? 1 0 Yes Yes Yes  Comment - - - pt fell in bathroom after losing balance; denies injury -  Number falls in past yr: 0 - 2 or more 1 1  Injury with Fall? 0 - Yes No No  Risk Factor Category  - - High Fall Risk - -  Risk for fall due to : - - - Impaired balance/gait;Impaired mobility;History of fall(s);Impaired vision;Mental status change Impaired balance/gait;Impaired mobility      Dementia and h/o seizure disorder - followed by Dr Jaynee Eagles on aricept '10mg'$  daily, keppra '500mg'$  bid, also on sertraline '100mg'$  daily.  Increased struggle at home given disability and chronic medical conditions of pt and husband - husband is having trouble caring for wife given his own chronic medical conditions. They ask about further assistance at home.   No h/o glaucoma.  Ongoing clear rhinorrhea worse in afternoons despite multiple antihistamines tried.   Preventative: Colon cancer screening -discussed - never completed cologurad last year. Would likecologard. Lung cancer screening -never smoker Breast cancer screening -last mammo 2013. Agrees to rpt mammo - ordered.  Well woman exam -none recently. Menopause around age 24yo. Declines exam. Denies bleeding or pelvic pain.  DEXA scan - will refer again.  Flu shot -yearly prevnar 2018, penumovax 2016 Tetanus -declines Shingrix - discussed  COVID vaccine - interested but haven't been able to schedule.  Advanced directive discussion -has not set up. Packet previously provided. Would want husband to be HCPOA thenbrother andBIL.  Seat belt use discussed Sunscreen usediscussed. No changing moles on skin. Non smoker Alcohol - none Dentist - has not seen - no dental insurance Eye exam - due  Bowel - no constipation  Bladder - incontinence stable when on both toviaz and myrbetriq - insurance stopepd covering [name] so we will try oxybutynin ER. Occasional depends use.  Lives with husband, 2 dogs Moved from [facility] 2009 Occupation: housewife Edu: completed HS GED Activity: no regular exercise. Dizziness precludes activity Diet: good water, fruits/vegetables daily     Relevant past medical, surgical, family and social history reviewed and updated as indicated. Interim medical history since our last visit reviewed. Allergies and medications reviewed and updated. Outpatient Medications Prior to Visit  Medication Sig Dispense Refill  . Accu-Chek FastClix Lancets MISC Use as instructed to check blood sugar once daily 100 each 1  . acetaminophen (TYLENOL) 500 MG  tablet Take 1 tablet (500 mg total) by mouth every 8 (eight) hours as needed.  0  . Alcohol Swabs (B-D SINGLE USE SWABS REGULAR) PADS Use as instructed to check blood sugar once daily 100 each 1  . atorvastatin (LIPITOR) 40 MG tablet TAKE 1 TABLET EVERY DAY (Patient taking differently: Take 40 mg by mouth daily at 6 PM. TAKE 1 TABLET EVERY DAY) 90 tablet 1  . Blood Glucose Monitoring Suppl (ACCU-CHEK GUIDE) w/Device KIT 1 each by Does not apply route as directed. Use as instructed to check blood sugar once daily 1 kit 0  . Cholecalciferol (VITAMIN D) 50 MCG (2000 UT) CAPS Take 1 capsule (2,000 Units total) by mouth daily. 30 capsule   . donepezil (ARICEPT) 10 MG tablet Take 1 tablet (10 mg total) by mouth daily. 90 tablet 2  . glucose blood (ACCU-CHEK GUIDE) test strip Use as instructed to check blood sugar once daily 100 each 1  . levETIRAcetam (KEPPRA) 500 MG tablet TAKE 1 TABLET(500 MG) BY MOUTH TWICE DAILY 60 tablet 1  . levothyroxine (SYNTHROID) 25 MCG tablet Take 1 tablet (25 mcg total) by mouth daily before breakfast. 90 tablet 1  . loperamide (IMODIUM A-D) 2 MG capsule Take 1 capsule (2 mg total) by mouth as needed for diarrhea or loose stools. 30 capsule 3  . loratadine (CLARITIN) 10 MG tablet Take 1 tablet (10 mg total) by mouth daily.    . meclizine (ANTIVERT) 25 MG tablet TAKE 1/2 TO 1 TABLET TWICE DAILY AS NEEDED FOR DIZZINESS (Patient taking differently: Take 12.5-25 mg by mouth 2 (two) times daily as needed for dizziness. ) 60 tablet 0  . metFORMIN (GLUCOPHAGE) 500 MG tablet TAKE 1 TABLET EVERY DAY WITH BREAKFAST (Patient taking differently: Take 500 mg by mouth daily with breakfast. ) 90 tablet 1  . mirabegron ER ([name]) 50 MG TB24 tablet Take 1 tablet (50 mg total) by mouth at bedtime. 90 tablet 1  . propranolol (INDERAL) 10 MG tablet TAKE 1 TABLET TWICE DAILY (Patient taking differently: Take 10 mg by mouth 2 (two) times daily. ) 180 tablet 1  . sertraline (ZOLOFT) 100 MG tablet  Take 1 tablet (100 mg total) by mouth daily. 90 tablet 2  . Zinc Sulfate 140 (50 Zn) MG TABS Take 1 tablet by mouth daily. 90 tablet 3  . fesoterodine ([name]) 8 MG TB24 tablet TAKE 1 TABLET (8 MG TOTAL) BY MOUTH DAILY. 90 tablet 1   No facility-administered medications prior to visit.     Per HPI unless specifically indicated in ROS section below Review of Systems  Constitutional: Positive for appetite change (decreased). Negative for activity change, chills, fatigue, fever and unexpected weight change.  HENT: Negative for hearing loss.   Eyes: Negative for visual disturbance.  Respiratory: Negative for cough, chest  tightness, shortness of breath and wheezing.   Cardiovascular: Negative for chest pain, palpitations and leg swelling.  Gastrointestinal: Positive for nausea (recently, now resolved). Negative for abdominal distention, abdominal pain, blood in stool, constipation, diarrhea and vomiting.  Genitourinary: Negative for difficulty urinating and hematuria.  Musculoskeletal: Negative for arthralgias, myalgias and neck pain.  Skin: Negative for rash.  Neurological: Negative for dizziness, seizures, syncope and headaches.  Hematological: Negative for adenopathy. Does not bruise/bleed easily.  Psychiatric/Behavioral: Negative for dysphoric mood. The patient is not nervous/anxious.    Objective:  BP 124/78 (BP Location: Left Arm, Patient Position: Sitting, Cuff Size: Normal)   Pulse 72   Temp 97.6 F (36.4 C) (Temporal)   Ht 4' 11.5" (1.511 m)   Wt 149 lb (67.6 kg)   SpO2 95%   BMI 29.59 kg/m   Wt Readings from Last 3 Encounters:  02/24/20 149 lb (67.6 kg)  09/19/19 145 lb 8 oz (66 kg)  05/31/19 145 lb 1 oz (65.8 kg)      Physical Exam Vitals and nursing note reviewed.  Constitutional:      General: She is not in acute distress.    Appearance: Normal appearance. She is well-developed. She is not ill-appearing.  HENT:     Head: Normocephalic and atraumatic.     Right  Ear: Hearing, tympanic membrane, ear canal and external ear normal.     Left Ear: Hearing, tympanic membrane, ear canal and external ear normal.     Mouth/Throat:     Pharynx: Uvula midline.  Eyes:     General: No scleral icterus.    Extraocular Movements: Extraocular movements intact.     Conjunctiva/sclera: Conjunctivae normal.     Pupils: Pupils are equal, round, and reactive to light.  Neck:     Thyroid: No thyroid mass, thyromegaly or thyroid tenderness.  Cardiovascular:     Rate and Rhythm: Normal rate and regular rhythm.     Pulses: Normal pulses.          Radial pulses are 2+ on the right side and 2+ on the left side.     Heart sounds: Normal heart sounds. No murmur heard.   Pulmonary:     Effort: Pulmonary effort is normal. No respiratory distress.     Breath sounds: Normal breath sounds. No wheezing, rhonchi or rales.  Abdominal:     General: Abdomen is flat. Bowel sounds are normal. There is no distension.     Palpations: Abdomen is soft. There is no mass.     Tenderness: There is no abdominal tenderness. There is no guarding or rebound.     Hernia: No hernia is present.  Musculoskeletal:        General: Normal range of motion.     Cervical back: Normal range of motion and neck supple.     Right lower leg: No edema.     Left lower leg: No edema.     Comments: R polydactyly  Lymphadenopathy:     Cervical: No cervical adenopathy.  Skin:    General: Skin is warm and dry.     Findings: No rash.  Neurological:     General: No focal deficit present.     Mental Status: She is alert and oriented to person, place, and time.     Comments:  CN grossly intact, station and gait intact Recall 2/3, 3/3 with cue Calculation 5/5 DLROW   Psychiatric:        Mood and Affect: Mood normal.  Behavior: Behavior normal.        Thought Content: Thought content normal.        Judgment: Judgment normal.       Results for orders placed or performed in visit on 02/20/20    VITAMIN D 25 Hydroxy (Vit-D Deficiency, Fractures)  Result Value Ref Range   VITD 39.10 30.00 - 100.00 ng/mL  CBC with Differential/Platelet  Result Value Ref Range   WBC 4.7 4.0 - 10.5 K/uL   RBC 4.32 3.87 - 5.11 Mil/uL   Hemoglobin 12.4 12.0 - 15.0 g/dL   HCT 36.8 36 - 46 %   MCV 85.2 78.0 - 100.0 fl   MCHC 33.6 30.0 - 36.0 g/dL   RDW 13.5 11.5 - 15.5 %   Platelets 295.0 150 - 400 K/uL   Neutrophils Relative % 48.9 43 - 77 %   Lymphocytes Relative 36.8 12 - 46 %   Monocytes Relative 9.7 3 - 12 %   Eosinophils Relative 3.5 0 - 5 %   Basophils Relative 1.1 0 - 3 %   Neutro Abs 2.3 1.4 - 7.7 K/uL   Lymphs Abs 1.7 0.7 - 4.0 K/uL   Monocytes Absolute 0.5 0 - 1 K/uL   Eosinophils Absolute 0.2 0 - 0 K/uL   Basophils Absolute 0.1 0 - 0 K/uL  Hemoglobin A1c  Result Value Ref Range   Hgb A1c MFr Bld 6.3 4.6 - 6.5 %  TSH  Result Value Ref Range   TSH 2.84 0.35 - 4.50 uIU/mL  Comprehensive metabolic panel  Result Value Ref Range   Sodium 134 (L) 135 - 145 mEq/L   Potassium 4.2 3.5 - 5.1 mEq/L   Chloride 101 96 - 112 mEq/L   CO2 28 19 - 32 mEq/L   Glucose, Bld 100 (H) 70 - 99 mg/dL   BUN 8 6 - 23 mg/dL   Creatinine, Ser 0.71 0.40 - 1.20 mg/dL   Total Bilirubin 0.4 0.2 - 1.2 mg/dL   Alkaline Phosphatase 72 39 - 117 U/L   AST 13 0 - 37 U/L   ALT 11 0 - 35 U/L   Total Protein 6.9 6.0 - 8.3 g/dL   Albumin 4.0 3.5 - 5.2 g/dL   GFR 80.28 >60.00 mL/min   Calcium 9.2 8.4 - 10.5 mg/dL  Lipid panel  Result Value Ref Range   Cholesterol 147 0 - 200 mg/dL   Triglycerides 106.0 0 - 149 mg/dL   HDL 46.50 >39.00 mg/dL   VLDL 21.2 0.0 - 40.0 mg/dL   LDL Cholesterol 79 0 - 99 mg/dL   Total CHOL/HDL Ratio 3    NonHDL 100.51    Assessment & Plan:  This visit occurred during the SARS-CoV-2 public health emergency.  Safety protocols were in place, including screening questions prior to the visit, additional usage of staff PPE, and extensive cleaning of exam room while observing appropriate  contact time as indicated for disinfecting solutions.   Problem List Items Addressed This Visit    Vitamin D deficiency    Stable period on 2000 IU replacement.       Type 2 diabetes, controlled, 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HCC)    Chronic, stable. Continue current regimen of metformin.  Encouraged they schedule eye exam.      Seizure disorder ([place])    Stable period on keppra.       Rhinorrhea    Not responding to oral antihistamine - will start astelin nasal spray. No known h/o glaucoma.  Polydactyly of thumb   Overactive bladder    [name] was initially not covered by insurance - will start oxybutynin XL '5mg'$  in interim, continue myrbetriq. She is tolerating and receiving benefit from medications.  After visit I received notice of approval from insurance for [name] - will give patient choice of med to fill based on affordability.       Medicare annual wellness visit, subsequent - Primary    I have personally reviewed the Medicare Annual Wellness questionnaire and have noted 1. The patient's medical and social history 2. Their use of alcohol, tobacco or illicit drugs 3. Their current medications and supplements 4. The patient's functional ability including ADL's, fall risks, home safety risks and hearing or visual impairment. Cognitive function has been assessed and addressed as indicated.  5. Diet and physical activity 6. Evidence for depression or mood disorders The patients weight, height, BMI have been recorded in the chart. I have made referrals, counseling and provided education to the patient based on review of the above and I have provided the pt with a written personalized care plan for preventive services. Provider list updated.. See scanned questionairre as needed for further documentation. Reviewed preventative protocols and updated unless pt declined.       Hypothyroidism    Stable period on low dose levothyroxine      Hypertension    Chronic, stable.  Continue current regimen of propranolol '10mg'$  bid.       Hyperlipidemia associated with type 2 diabetes mellitus (HCC)    Chronic, stable continue atorvastatin.  The 10-year ASCVD risk score Leah Payne DC Brooke Bonito., et al., 2013) is: 30.9%   Values used to calculate the score:     Age: 33 years     Sex: Female     Is Non-Hispanic African American: No     Diabetic: Yes     Tobacco smoker: No     Systolic Blood Pressure: 403 mmHg     Is BP treated: Yes     HDL Cholesterol: 46.5 mg/dL     Total Cholesterol: 147 mg/dL       Health maintenance examination    Preventative protocols reviewed and updated unless pt declined. Discussed healthy diet and lifestyle.       Family circumstance    Husband experiencing caregiver stress given his own chronic medical conditions and inability to leave wife alone for prolonged periods of time (she had fall in bathroom and was down for 16 hours when husband was hospitalized earlier this month).  I will ask our care guide to reach out to patient to see of any social assistance we may provide.       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HCC)   Asymmetrical hearing loss of right ear    Refer to audiology.       Relevant Orders   Ambulatory referral to Audiology   Alzheimer disease [facility])    Appreciate neuro care. Stable period on aricept.       Advanced care planning/counseling discussion    Advanced directive discussion -has not set up. Packet previously provided. Would want husband to be HCPOA thenbrother andBIL.           Meds ordered this encounter  Medications  . oxybutynin (DITROPAN-XL) 5 MG 24 hr tablet    Sig: Take 1 tablet (5 mg total) by mouth at bedtime.    Dispense:  30 tablet    Refill:  6    To replace [name]  . azelastine (ASTELIN) 0.1 % nasal  spray    Sig: Place 1 spray into both nostrils 2 (two) times daily. Use in each nostril as directed    Dispense:  30 mL    Refill:  12   Orders Placed This Encounter  Procedures  . Ambulatory  referral to Audiology    Referral Priority:   Routine    Referral Type:   Audiology Exam    Referral Reason:   Specialty Services Required    Number of Visits Requested:   1    Patient instructions: Start Oxybutynin XR '5mg'$  daily in place of [name].  Try astelin nasal spray for daily runny nose. If it's helping, we could try off claritin (allergy medicine).  We will sign you up for cologuard again - let us or the company know if any questions.  Schedule eye exam as you had trouble with your vision test today.  We will refer you for hearing test  You are due for mammogram and bone density scan.  Check with local pharmacies about drive up COVID vaccines. May also check with [facility] [phone].  Return as needed or in 6 months for follow up visit.   Follow up plan: Return in about 6 months (around 08/26/2020), or if symptoms worsen or fail to improve, for follow up visit.  [name], MD

## 2020-02-25 DIAGNOSIS — H918X3 Other specified hearing loss, bilateral: Secondary | ICD-10-CM | POA: Insufficient documentation

## 2020-02-25 DIAGNOSIS — J3489 Other specified disorders of nose and nasal sinuses: Secondary | ICD-10-CM | POA: Insufficient documentation

## 2020-02-25 DIAGNOSIS — H918X1 Other specified hearing loss, right ear: Secondary | ICD-10-CM | POA: Insufficient documentation

## 2020-02-25 DIAGNOSIS — IMO0001 Reserved for inherently not codable concepts without codable children: Secondary | ICD-10-CM | POA: Insufficient documentation

## 2020-02-25 MED ORDER — TOVIAZ 8 MG PO TB24: ORAL_TABLET | ORAL | 1 refills | Status: DC

## 2020-02-25 NOTE — Assessment & Plan Note (Signed)
Chronic, stable continue atorvastatin.  The 10-year ASCVD risk score Denman George DC Montez Hageman., et al., 2013) is: 30.9%   Values used to calculate the score:     Age: 74 years     Sex: Female     Is Non-Hispanic African American: No     Diabetic: Yes     Tobacco smoker: No     Systolic Blood Pressure: 124 mmHg     Is BP treated: Yes     HDL Cholesterol: 46.5 mg/dL     Total Cholesterol: 147 mg/dL

## 2020-02-25 NOTE — Addendum Note (Signed)
Addended by: Nanci Pina on: 02/25/2020 10:52 AM   Modules accepted: Orders

## 2020-02-25 NOTE — Assessment & Plan Note (Signed)
Preventative protocols reviewed and updated unless pt declined. Discussed healthy diet and lifestyle.  

## 2020-02-25 NOTE — Assessment & Plan Note (Addendum)
Advanced directive discussion -has not set up. Packet previously provided. Would want husband to be HCPOA thenbrother andBIL.

## 2020-02-25 NOTE — Assessment & Plan Note (Signed)
Stable period on 2000 IU replacement.

## 2020-02-25 NOTE — Assessment & Plan Note (Signed)
Not responding to oral antihistamine - will start astelin nasal spray. No known h/o glaucoma.

## 2020-02-25 NOTE — Assessment & Plan Note (Signed)
Chronic, stable. Continue current regimen of propranolol 10mg  bid.

## 2020-02-25 NOTE — Assessment & Plan Note (Signed)

## 2020-02-25 NOTE — Assessment & Plan Note (Signed)
Stable period on low dose levothyroxine.  °

## 2020-02-25 NOTE — Assessment & Plan Note (Signed)
Stable period on keppra.  

## 2020-02-25 NOTE — Assessment & Plan Note (Addendum)
Leah Payne was initially not covered by insurance - will start oxybutynin XL 5mg  in interim, continue myrbetriq. She is tolerating and receiving benefit from medications.  After visit I received notice of approval from insurance for - will give patient choice of med to fill based on affordability.

## 2020-02-25 NOTE — Assessment & Plan Note (Signed)
Refer to audiology.

## 2020-02-25 NOTE — Telephone Encounter (Addendum)
Spoke with pt/pt's husband relaying Dr. Timoteo Expose message.  They verbalize understanding and will call us back with a decision.  Spoke with [facility] informing them of PA approval for [name].  Says they will need a new rx sent in so they can let pt know how much it would be.    Sent new [name] rx.

## 2020-02-25 NOTE — Assessment & Plan Note (Addendum)
Chronic, stable. Continue current regimen of metformin.  Encouraged they schedule eye exam.

## 2020-02-25 NOTE — Assessment & Plan Note (Signed)
Appreciate neuro care. Stable period on aricept.

## 2020-02-26 DIAGNOSIS — Z639 Problem related to primary support group, unspecified: Secondary | ICD-10-CM | POA: Insufficient documentation

## 2020-02-26 NOTE — Assessment & Plan Note (Signed)
Husband experiencing caregiver stress given his own chronic medical conditions and inability to leave wife alone for prolonged periods of time (she had fall in bathroom and was down for 16 hours when husband was hospitalized earlier this month).  I will ask our care guide to reach out to patient to see of any social assistance we may provide.

## 2020-02-26 NOTE — Telephone Encounter (Signed)
Spoke with pt's husband, [name] (on dpr), about [name] and oxybutynin.  Says he and pt decided to stay with [name], since she is used to it.    Notified [facility] not to fill oxybutynin.  Says they will fill [name].  FYI to Dr. [name].

## 2020-02-27 ENCOUNTER — Telehealth: Payer: Self-pay

## 2020-02-27 NOTE — Telephone Encounter (Signed)
02/27/20 Spoke with patient's husband Leah Payne about in home care resources [facility] [place] [phone], [facility] [phone].  He plans to call them in the next few days.  I will continue to research resources and follow-up with him. Olean Ree [phone]

## 2020-02-27 NOTE — Telephone Encounter (Signed)
Great. Thank you.

## 2020-02-27 NOTE — Chronic Care Management (AMB) (Signed)
Called patient's husband to confirm whether they would like to resume [name] since PA approved or start oxybutynin. They would like to resume [name]. Will deliver [name] today through [facility] and cancel prescription for oxybutynin.  Phil Dopp, PharmD Clinical Pharmacist [facility] [phone]

## 2020-03-02 NOTE — Telephone Encounter (Signed)
I have collaborated with the care management provider regarding care management and care coordination activities outlined in this encounter and have reviewed this encounter including documentation in the note and care plan. I am certifying that I agree with the content of this note and encounter as supervising physician.  

## 2020-03-05 ENCOUNTER — Ambulatory Visit: Payer: Medicare Other | Attending: Family Medicine | Admitting: Audiologist

## 2020-03-05 ENCOUNTER — Other Ambulatory Visit: Payer: Self-pay

## 2020-03-05 DIAGNOSIS — H9193 Unspecified hearing loss, bilateral: Secondary | ICD-10-CM | POA: Diagnosis not present

## 2020-03-05 NOTE — Telephone Encounter (Signed)
03/05/20 Spoke with patient's husband Shashana Fullington. Unable to speak patient was with the doctor.  Will call back later this afternoon or tomorrow morning. Olean Ree [phone]

## 2020-03-05 NOTE — Procedures (Signed)
[facility] [address] [place], [place]  [address] [phone]  AUDIOLOGICAL  EVALUATION  NAME: Leah Payne     DOB:   07-Jan-1946      MRN: [id number]                                                                                     DATE: 03/05/2020     REFERENT: Eustaquio Boyden, MD STATUS: Outpatient DIAGNOSIS: Bilateral hearing loss, unspecified hearing loss type   History: Cayden was seen for an audiological evaluation. Raelin was accompanied to the appointment by her husband.  Amaal is receiving a hearing evaluation due to concerns for hearing loss, her husband feels she does not hear him and others well. Cali reports no difficulty hearing. No pain or pressure reported in either ear. No tinnitus present in either ear. Veora has no reported noise exposure.  Medical history positive for Type 2 diabetes, controlled, with peripheral neuropathy, which is a risk factor for hearing loss. Pascale also has a diagnosis of:  Seizure disorder (HCC) 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HCC) Alzheimer disease (HCC)  Evaluation:   Otoscopy showed a clear view of the tympanic membranes with non-occluding cerumen, bilaterally  Tympanometry results were consistent with normal middle ear function   Audiometric testing was completed using conventional audiometry with insert transducer. Speech Recognition Thresholds were consistent with pure tone averages. Word Recognition was excellent at an elevated level. Pure tone thresholds show mild sloping to moderately severe hearing loss in both ears. Hearing is symmetric.   Bone conduction testing could not be completed due to limitations of equipment.   Results:  The test results were reviewed with Claris Che and her husband. Hearing loss is mild sloping to a moderate loss at the very highest pitches. She needs to communicate face to face within five feet of other people. Shamonica is a borderline  hearing aid candidate. Hearing aids require consistent use to be beneficial. At this time Klyn says she does not perceive daily negative impact from the hearing loss. There are alternatives to hearing aids, see recommendations below.    Recommendations: 1. Recommend monitoring hearing loss with audiometric evaluations every other year. Megha has several medical risk factors for hearing loss.  2. Amplification is necessary for both ears. Hearing aids can be purchased from a variety of locations. Hearing aids require consistent daily use for benefit. A list of local providers will be mailed to [facility] with this report.  a. Zoiee was advised to call [employer] and ask for [employer] for a list of local providers who can take her insurance for hearing aids. Hearing aids will be partially or totally covered depending on her plan. Not all providers are in network with [employer] (even if in network with [employer]). I highly advise Sweta to call her insurance to find an in network provider.  b. If Journe decides not to pursue hearing aids at this time, then a personal sound amplifier is also a good option.  i. TV Ears can be purchased online, on [place], or at [employer].  ii. The following are all amplified headsets that Cyrena can  easily wear at home or with family to help her hear day to day conversation without the cost or upkeep of a hearing aid. They can all be purchased online.  1. BeHear Access by [employer]  2. Williams Sound PockeTalker Ultra Duo [employer] with Headphone 3. Orland Penman Ultra 2.0 - Personal Sound Amplifier Rechargeable    Ammie Ferrier  Audiologist, Au.D., CCC-A 03/05/2020  3:36 PM  Cc: Eustaquio Boyden, MD

## 2020-03-06 ENCOUNTER — Telehealth: Payer: Self-pay

## 2020-03-06 MED ORDER — SERTRALINE HCL 100 MG PO TABS: 100.0000 mg | ORAL_TABLET | Freq: Every day | ORAL | 2 refills | Status: DC

## 2020-03-06 NOTE — Progress Notes (Signed)
Chronic Care Management Pharmacy Assistant   Name: Leah Payne  MRN: [id number] DOB: [birth year]  Reason for Encounter: Medication Review  Patient Questions:  1.  Have you seen any other providers since your last visit? Yes, Primary care provider  2.  Any changes in your medicines or health? Yes, Started nasal spray   PCP : [name], MD  Allergies:  No Known Allergies  Medications: Outpatient Encounter Medications as of 03/06/2020  Medication Sig Note  . Accu-Chek FastClix Lancets MISC Use as instructed to check blood sugar once daily   . acetaminophen (TYLENOL) 500 MG tablet Take 1 tablet (500 mg total) by mouth every 8 (eight) hours as needed.   . Alcohol Swabs (B-D SINGLE USE SWABS REGULAR) PADS Use as instructed to check blood sugar once daily   . atorvastatin (LIPITOR) 40 MG tablet TAKE 1 TABLET EVERY DAY (Patient taking differently: Take 40 mg by mouth daily at 6 PM. TAKE 1 TABLET EVERY DAY) 07/26/2019: Patient states she does not think she takes this medication  . azelastine (ASTELIN) 0.1 % nasal spray Place 1 spray into both nostrils 2 (two) times daily. Use in each nostril as directed   . Blood Glucose Monitoring Suppl (ACCU-CHEK GUIDE) w/Device KIT 1 each by Does not apply route as directed. Use as instructed to check blood sugar once daily   . Cholecalciferol (VITAMIN D) 50 MCG (2000 UT) CAPS Take 1 capsule (2,000 Units total) by mouth daily.   Marland Kitchen donepezil (ARICEPT) 10 MG tablet Take 1 tablet (10 mg total) by mouth daily.   . fesoterodine ([name]) 8 MG TB24 tablet TAKE 1 TABLET (8 MG TOTAL) BY MOUTH DAILY.   Marland Kitchen glucose blood (ACCU-CHEK GUIDE) test strip Use as instructed to check blood sugar once daily   . levETIRAcetam (KEPPRA) 500 MG tablet TAKE 1 TABLET(500 MG) BY MOUTH TWICE DAILY   . levothyroxine (SYNTHROID) 25 MCG tablet Take 1 tablet (25 mcg total) by mouth daily before breakfast.   . loperamide (IMODIUM A-D) 2 MG capsule Take 1 capsule (2 mg total) by  mouth as needed for diarrhea or loose stools.   Marland Kitchen loratadine (CLARITIN) 10 MG tablet Take 1 tablet (10 mg total) by mouth daily.   . meclizine (ANTIVERT) 25 MG tablet TAKE 1/2 TO 1 TABLET TWICE DAILY AS NEEDED FOR DIZZINESS (Patient taking differently: Take 12.5-25 mg by mouth 2 (two) times daily as needed for dizziness. )   . metFORMIN (GLUCOPHAGE) 500 MG tablet TAKE 1 TABLET EVERY DAY WITH BREAKFAST (Patient taking differently: Take 500 mg by mouth daily with breakfast. )   . mirabegron ER ([name]) 50 MG TB24 tablet Take 1 tablet (50 mg total) by mouth at bedtime.   Marland Kitchen oxybutynin (DITROPAN-XL) 5 MG 24 hr tablet Take 1 tablet (5 mg total) by mouth at bedtime.   . propranolol (INDERAL) 10 MG tablet TAKE 1 TABLET TWICE DAILY (Patient taking differently: Take 10 mg by mouth 2 (two) times daily. )   . sertraline (ZOLOFT) 100 MG tablet Take 1 tablet (100 mg total) by mouth daily.   . Zinc Sulfate 140 (50 Zn) MG TABS Take 1 tablet by mouth daily.    No facility-administered encounter medications on file as of 03/06/2020.    Current Diagnosis: Patient Active Problem List   Diagnosis Date Noted  . Family circumstance 02/26/2020  . Asymmetrical hearing loss of right ear 02/25/2020  . Rhinorrhea 02/25/2020  . 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[place]) 09/19/2019  . Chronic diarrhea  05/06/2019  . 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 Candidal intertrigo 02/16/2018  . Fall at home, initial encounter 08/01/2017  . Abdominal distension 05/16/2017  . Vitamin D deficiency 03/21/2017  . Medicare annual wellness visit, subsequent 09/23/2016  . Health maintenance examination 09/23/2016  . Advanced care planning/counseling discussion 09/23/2016  . Dizziness 03/23/2016  . Alzheimer disease ([place]) 12/17/2015  . Imbalance 12/17/2015  . WPW (Wolff-Parkinson-White syndrome)   . Type 2 diabetes, controlled, 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[place]) 09/17/2015  . Hypothyroidism 09/17/2015  . Overactive bladder 08/29/2014  . Seizure  disorder ([place]) 08/04/2013  . 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[place]) 08/04/2013  . Hypertension 08/04/2013  . Hyperlipidemia associated with type 2 diabetes mellitus ([place]) 08/04/2013    Goals Addressed   None     Follow-Up:  Coordination of Enhanced Pharmacy Services  Reviewed chart for medication changes ahead of medication coordination call.  Office Visit 02/24/20 with PCP for yearly PE. Started on astelin nasal spray. Also referred for hearing test during this visit.    BP Readings from Last 3 Encounters:  02/24/20 124/78  09/19/19 136/80  07/31/19 (!) 161/96    Lab Results  Component Value Date   HGBA1C 6.3 02/20/2020     Patient obtains medications through Adherence Packaging  30 Days   Last adherence delivery included:  Donepezil 10 mg daily 47 DS on 01/28/20 Vitamin D 2000 IU daily 47 DS on 01/28/20 Sertraline 100 mg daily 47 DS on 01/28/20 [name] 8 mg daily - 38 DS  Levetiracetam 500 mg BID- 35 DS 02/07/20 Atorvastatin 40 mg daily 35 DS 02/07/20 [name] 50 SD 02/07/20  Patient declined the following last month: Levothyroxine 25 mcg daily - adequate supply on hand Metformin 500 mg daily - adequate supply on hand Propranolol 10 mg BID - had adequate supply on hand   Patient is due for next adherence delivery on: 03/13/2020. Called patient and reviewed medications and coordinated delivery.  This delivery to include: . Atorvastatin 40 mg- 1 EM . Vitamin D - 1 breakfast . Donepezil 10 mg - 1 breakfast . [name] 8 mg - 1 EM . Levetiracetam 500 mg - 1 breakfast & 1 EM . Loratadine 10 mg - 1 EM . [name] 50 mg - 1 EM  . Sertraline 100 mg - 1 Breakfast Patient declined the following medications  o Levothyroxine 25 mcg-1 Breakfast- 90DS on 01/09/20- will include in September packaging o Metformin 500 mg - 1 breakfast - Will not need until 05/30/20 o Propranolol 10 mg - 1 Breakfast 1 EM - do not fill until 03/2020  Patient needs refills for Sertraline- message sent to Emeterio Reeve  for coordinating refill.  Confirmed delivery date of 03/13/2020, advised patient that pharmacy will contact them the morning of delivery.  Martinique Uselman, [facility] Pharmacist Assistant  [phone]

## 2020-03-06 NOTE — Chronic Care Management (AMB) (Signed)
Leah Payne is out of refills on her sertraline. She is using [employer] for packaging and delivery.   Please submit refill to [facility].   Thank you! Juliane Lack, PharmD, [facility] Clinical Pharmacist [facility] [phone] (office) [phone] (mobile)

## 2020-03-06 NOTE — Telephone Encounter (Signed)
03/06/20 Left message on voicemail for patient's husband Isis Costanza to return my call regarding resources given for in-home aide for patient.  Will attempt to call again later today. Olean Ree [phone]

## 2020-03-06 NOTE — Addendum Note (Signed)
Addended by: Natalyn Szymanowski on: 03/06/2020 04:49 PM   Modules accepted: Orders  

## 2020-03-11 NOTE — Telephone Encounter (Signed)
03/11/20 Spoke with patient's husband Dayna Alia he plans to call resources for in-home care for the patient.  Left message for Julaine Fusi at [facility] In-home Care to return my call.  Olean Ree [phone]

## 2020-03-23 NOTE — Telephone Encounter (Signed)
    MA8/30/2021   Name: Leah Payne   MRN: [id number]   DOB: [birth year]   AGE: 74 y.o.   GENDER: female   PCP Eustaquio Boyden, MD.   03/23/20 Spoke with patient's husband Leah Payne he plans on calling [employer] In-home care, [facility] [place] In-home care and [employer] [facility] In-home care resources.  No other resources needed at this time. Closing referral.    Antoinette Haskett, AAS Paralegal, [facility] Care Guide . Embedded Care Coordination [facility]  Care Management  [address] [place], [place] [address] [email or web address]  [phone]   [email or web address]

## 2020-03-23 NOTE — Telephone Encounter (Signed)
Thank you :)

## 2020-04-03 ENCOUNTER — Telehealth: Payer: Self-pay

## 2020-04-03 MED ORDER — PROPRANOLOL HCL 10 MG PO TABS: 10.0000 mg | ORAL_TABLET | Freq: Two times a day (BID) | ORAL | 1 refills | Status: DC

## 2020-04-03 NOTE — Chronic Care Management (AMB) (Signed)
Patient is utilizing [facility] for coordination and delivery services.   Patient needs a refill for: Propanolol  Please send refill to [facility] in [place].   Thank you,  Juliane Lack, PharmD, [facility] Clinical Pharmacist [facility] [phone] (office) [phone] (mobile)

## 2020-04-03 NOTE — Addendum Note (Signed)
Addended by: Eustaquio Boyden on: 04/03/2020 01:47 PM   Modules accepted: Orders

## 2020-04-06 ENCOUNTER — Telehealth: Payer: Self-pay

## 2020-04-06 ENCOUNTER — Ambulatory Visit: Payer: Self-pay

## 2020-04-06 NOTE — Chronic Care Management (AMB) (Signed)
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   The following medications are out of refills:   Atorvastatin  Metformin  Levetiracetam  Please send refills to [facility] in [place].   Thank you,  Juliane Lack, PharmD, [facility] Clinical Pharmacist [facility] [phone] (office) [phone] (mobile)

## 2020-04-07 ENCOUNTER — Ambulatory Visit (INDEPENDENT_AMBULATORY_CARE_PROVIDER_SITE_OTHER): Payer: Medicare Other

## 2020-04-07 ENCOUNTER — Other Ambulatory Visit: Payer: Self-pay

## 2020-04-07 DIAGNOSIS — Z23 Encounter for immunization: Secondary | ICD-10-CM | POA: Diagnosis not present

## 2020-04-07 MED ORDER — LEVETIRACETAM 500 MG PO TABS: ORAL_TABLET | ORAL | 1 refills | Status: DC

## 2020-04-07 MED ORDER — METFORMIN HCL 500 MG PO TABS: ORAL_TABLET | ORAL | 3 refills | Status: DC

## 2020-04-07 MED ORDER — ATORVASTATIN CALCIUM 40 MG PO TABS: 40.0000 mg | ORAL_TABLET | Freq: Every day | ORAL | 3 refills | Status: DC

## 2020-04-07 NOTE — Progress Notes (Signed)
I have collaborated with the care management provider regarding care management and care coordination activities outlined in this encounter and have reviewed this encounter including documentation in the note and care plan. I am certifying that I agree with the content of this note and encounter as supervising physician.   See documented note. She needs to f/u with neurology for further keppra refills (Dr Lucia Gaskins).

## 2020-04-07 NOTE — Addendum Note (Signed)
Addended by: Eustaquio Boyden on: 04/07/2020 12:15 AM   Modules accepted: Orders

## 2020-04-07 NOTE — Progress Notes (Signed)
meds refilled. keppra is through neurology, patient is overdue for f/u. I have sent in 1 month supply with 1 refill but pt needs to schedule neurology f/u for further refills.

## 2020-04-07 NOTE — Progress Notes (Signed)
Patient informed and aware to follow-up with neurology for additional refills of Keppra.   Juliane Lack, PharmD, [facility] Clinical Pharmacist [facility] [phone] (office) [phone] (mobile)

## 2020-04-09 NOTE — Chronic Care Management (AMB) (Addendum)
Chronic Care Management Pharmacy Assistant    Name: Leah Payne         MRN: [id number]        DOB: 1946/03/29   Reason for Encounter: Medication Review       PCP : [name], MD   Allergies:  No Known Allergies   Medications:      Outpatient Encounter Medications as of 04/06/2020  Medication Sig Note   Accu-Chek FastClix Lancets MISC Use as instructed to check blood sugar once daily     acetaminophen (TYLENOL) 500 MG tablet Take 1 tablet (500 mg total) by mouth every 8 (eight) hours as needed.     Alcohol Swabs (B-D SINGLE USE SWABS REGULAR) PADS Use as instructed to check blood sugar once daily     atorvastatin (LIPITOR) 40 MG tablet TAKE 1 TABLET EVERY DAY (Patient taking differently: Take 40 mg by mouth daily at 6 PM. TAKE 1 TABLET EVERY DAY) 07/26/2019: Patient states she does not think she takes this medication   azelastine (ASTELIN) 0.1 % nasal spray Place 1 spray into both nostrils 2 (two) times daily. Use in each nostril as directed     Blood Glucose Monitoring Suppl (ACCU-CHEK GUIDE) w/Device KIT 1 each by Does not apply route as directed. Use as instructed to check blood sugar once daily     Cholecalciferol (VITAMIN D) 50 MCG (2000 UT) CAPS Take 1 capsule (2,000 Units total) by mouth daily.     donepezil (ARICEPT) 10 MG tablet Take 1 tablet (10 mg total) by mouth daily.     fesoterodine ([name]) 8 MG TB24 tablet TAKE 1 TABLET (8 MG TOTAL) BY MOUTH DAILY.     glucose blood (ACCU-CHEK GUIDE) test strip Use as instructed to check blood sugar once daily     levETIRAcetam (KEPPRA) 500 MG tablet TAKE 1 TABLET(500 MG) BY MOUTH TWICE DAILY     levothyroxine (SYNTHROID) 25 MCG tablet Take 1 tablet (25 mcg total) by mouth daily before breakfast.     loperamide (IMODIUM A-D) 2 MG capsule Take 1 capsule (2 mg total) by mouth as needed for diarrhea or loose stools.     loratadine (CLARITIN) 10 MG tablet Take 1 tablet (10 mg total) by mouth daily.     meclizine (ANTIVERT)  25 MG tablet TAKE 1/2 TO 1 TABLET TWICE DAILY AS NEEDED FOR DIZZINESS (Patient taking differently: Take 12.5-25 mg by mouth 2 (two) times daily as needed for dizziness. )     metFORMIN (GLUCOPHAGE) 500 MG tablet TAKE 1 TABLET EVERY DAY WITH BREAKFAST (Patient taking differently: Take 500 mg by mouth daily with breakfast. )     mirabegron ER ([name]) 50 MG TB24 tablet Take 1 tablet (50 mg total) by mouth at bedtime.     oxybutynin (DITROPAN-XL) 5 MG 24 hr tablet Take 1 tablet (5 mg total) by mouth at bedtime.     propranolol (INDERAL) 10 MG tablet Take 1 tablet (10 mg total) by mouth 2 (two) times daily.     sertraline (ZOLOFT) 100 MG tablet Take 1 tablet (100 mg total) by mouth daily.     Zinc Sulfate 140 (50 Zn) MG TABS Take 1 tablet by mouth daily.      No facility-administered encounter medications on file as of 04/06/2020.      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   Candidal intertrigo 02/16/2018   Fall at home, initial encounter 08/01/2017   Abdominal distension 05/16/2017   Vitamin D deficiency 03/21/2017   Medicare annual wellness visit, subsequent 09/23/2016   Health maintenance examination 09/23/2016   Advanced care planning/counseling discussion 09/23/2016   Dizziness 03/23/2016   Alzheimer disease ([place]) 12/17/2015   Imbalance 12/17/2015   WPW (Wolff-Parkinson-White syndrome)     Type 2 diabetes, controlled, 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[place]) 09/17/2015   Hypothyroidism 09/17/2015   Overactive bladder 08/29/2014   Seizure disorder ([place]) 08/04/2013   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[place]) 08/04/2013   Hypertension 08/04/2013   Hyperlipidemia associated with type 2 diabetes mellitus ([place]) 08/04/2013    Reviewed chart for medication  changes ahead of medication coordination call.   No OVs, Consults, or hospital visits since last care coordination call/Pharmacist visit.    No medications changes indicated,       BP Readings from Last 3 Encounters:  02/24/20 124/78  09/19/19 136/80  07/31/19 (!) 161/96    Recent Labs       Lab Results  Component Value Date    HGBA1C 6.3 02/20/2020        Patient obtains medications through Adherence Packaging  30 Days Patient would like to change to vials.   Last adherence delivery included: Atorvastatin 40 mg-1 Tablet Daily,  Vitamin D 50 MCG - 1 Capsule Daily, Donepezil 10 mg - 1 Tablet daily, [name] 8 mg - 1 Tablet daily, Levetiracetam 500 mg - 2 tablets daily, Loratadine 10 mg - 1 tablet daily, [name] 50 mg - 1 tablet daily, Sertraline 100 mg -  1 tablet daily   Patient declined Levothyroxine 25 mcg-1 tablet daily, Metformin 500 mg - 1 tablet daily, Propranolol 10 mg - 1 tablet daily,     Patient is due for next adherence delivery on: 04/12/20. Called patient and reviewed medications and coordinated delivery.   This delivery to include: Atorvastatin 40 mg- 1 tablet daily Vitamin D 50 mcg - 1 tablet daily Donepezil 10 mg - 1 tablet daily [name] 8 mg - 1 tablet daily Levetiracetam 500 mg - 1 tablet twice a day [name] 50 mg - 1 tablet daily Sertraline 100 mg - 1 tablet daily Levothyroxine 25 mcg - 1 tablet daily  Propranolol 10 mg - 1 tablet twice daily   Patient needs refills for medications above.   Confirmed delivery date of 04/12/2020, advised patient spouse that pharmacy will contact them the morning of delivery.   Follow-Up:  Coordination of Enhanced Pharmacy Services and pharmacist review- patient did not do well with packaging and would like to go back to vials.    [place] Pharmacist Assistant  [phone]

## 2020-04-13 ENCOUNTER — Telehealth: Payer: Self-pay

## 2020-04-13 NOTE — Telephone Encounter (Signed)
Resent email to homebound vaccine program for pt to receive covid vaccine at home.

## 2020-04-20 NOTE — Progress Notes (Deleted)
PATIENT: Leah Payne DOB: Jun 23, 1946  REASON FOR VISIT: follow up HISTORY FROM: patient  No chief complaint on file.    HISTORY OF PRESENT ILLNESS: Today 04/20/20 Leah Payne is a 74 y.o. female here today for follow up for AD and seizures. She continues levetiracetam 500mg  BID and donepezil 10mg  daily (prescribed by PCP). She was recently seen for Medicare Wellness visit in 02/2020. Social work referral was discussed by PCP due to family needs. Leah Payne is primary caregiver. He suffers from chronic medical issues himself and is having a harder time caring for [name]. Leah Payne suggested social work referral in 11/2018. She was being seen by [facility]   She did have a fall in 01/2020 where she was down for approx 16 hours before someone found her.     HISTORY: (copied from Leah Payne note on 12/03/2018)  Interval 12/03/2018: Patient with mild-mod Alzheimer's disease diagnosed by formal neurocognitive Payne 04/26/2018 by Leah Payne. She also has PMHx seizure, hydrocephalus and fainting, DM2. She has had her sertraline  increased since last being seen. B12 was last 637, TSH normal, HgbA1c 6.1 and her diabetes is managed by her pcp. LDL 57. NA was 130.  She doesn't acknowledge and denies having dementia. Husband has been in the hospital a few times 2 weeks in March after falling. People from church are helping. It is a strain on him physically and mentally. They have medicaid. Discussed a memory unit. Discussed adult day centers. Patient can;t hardly walk at all. She leans over. She refuses the walker. She can't get in the shower alone, husband has to help with all ADLs. She has fallen. Memory declining. Needs social work. They are looking for someplace to move.He needs help with services in the area. Patient sleeps all the time, sleeps more than she is awake. She sleeps from 7pm-930am and then naps for 3 hours. She is apathetic. She appears depressed. Zoloft not helping.I  advised them to find a day center if possible during the Covid 19 andemic.Also she has medicaid and they may help with nursing twice a week.   Leah Payne's last note: HISTORY OF PRESENT ILLNESS:03/27/18:  Leah Payne a 74 year old female with a history of memory disturbance. She returns today for follow-up. Her other reports that her memory continues to decline. She does require assistance with ADLs. She had a fall approximately 4 to 5 weeks ago and was in the hospital briefly. They recommended home therapy. She does have a therapist that coming throughout the week. Theylooking at getting help in the home regularly after therapy is. Husband reports that her appetite has decreased. He reports that she sleeps a lot during the day. He denies any seizure events. She continues on Keppra. She did [facility] Leah[facility]. However she has a follow-up in October to review those results. She returns today for follow-up.  HPI 09/19/2017:  Leah Payne is a 74 y.o. female here as a referral from Leah. Danise Payne for memory problems. PMHx seizure, hydrocephalus and fainting, DM2.  She had a possible seizure in 2015 but no showed her neurology appointment with Leah Payne. She has an MRI scheduled. She had a possible seizure in 2015 and has stayed on the [place].No more episodes since then. She has chronic dizziness. She is with her husband who provides most information. It is more her short-term memory. She can't remember immediate things. Started about 4 years ago after the seizure. Husband feels as  this is progressive. She denies anything is wrong. Memory is worsening. Knows the year. She remembers birthdays. She forget events at church. Married for 55 years. Husband has always been responsible for bills. She has never driven. Husband goes to the grocery store, he started going for her in the 90s he said she couldn't do it, husband does the cooking. Her father had  Alzheimer's disease. Around 61 or 70. More short-term memory loss. She does not cook anymore, husband is concerned if she remembers. She can heat up coffee in the microwave. She repeats herself and asks the same questions in the same day. She acts normal with other people. Having difficulty walking. Can't pick feet up. No tremors. No hallucinations or delusions. No alcohol. She likes to go out, she is still social. No mood changes but she does worry a lot. Progressive. Patient denies any problems. Husband has to manage her medications.  Reviewed notes, labs and imaging from outside physicians, which showed:  B12, hemoglobin A1c, normal in July 2018, TSH normal in February 2018.  Reviewed emergency room notes in 2015.  Suspected new onset seizures.  They were at church, the husband was behind the pulpit performing a reading while is why set him up use, noticed her arm starting to [place] and she subsequently passed out.  She was somewhat difficult to arouse and appeared confused.  In the emergency department and nurse witnessed another similar episode. She had no previous seizure history. She was continued on Keppra, EEG and MRI ordered.  EEG was nonspecific but did not show evidence of an epileptic disorder.  CT scan of the head showed marketed chronic ventriculomegaly with prominent lateral and third ventricles and chronic compression of cerebral tissue along its periphery.  Fourth ventricle is normal in size.  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   REVIEW OF SYSTEMS: Out of a complete 14 system review of symptoms, the patient complains only of the following symptoms, and all other reviewed systems are negative.  ALLERGIES: No Known Allergies  HOME MEDICATIONS: Outpatient Medications Prior to Visit  Medication Sig Dispense Refill  . Accu-Chek FastClix Lancets MISC Use as instructed to check blood sugar once daily 100 each 1  . acetaminophen (TYLENOL) 500  MG tablet Take 1 tablet (500 mg total) by mouth every 8 (eight) hours as needed.  0  . Alcohol Swabs (B-D SINGLE USE SWABS REGULAR) PADS Use as instructed to check blood sugar once daily 100 each 1  . atorvastatin (LIPITOR) 40 MG tablet Take 1 tablet (40 mg total) by mouth daily. 90 tablet 3  . azelastine (ASTELIN) 0.1 % nasal spray Place 1 spray into both nostrils 2 (two) times daily. Use in each nostril as directed 30 mL 12  . Blood Glucose Monitoring Suppl (ACCU-CHEK GUIDE) w/Device KIT 1 each by Does not apply route as directed. Use as instructed to check blood sugar once daily 1 kit 0  . Cholecalciferol (VITAMIN D) 50 MCG (2000 UT) CAPS Take 1 capsule (2,000 Units total) by mouth daily. 30 capsule   . donepezil (ARICEPT) 10 MG tablet Take 1 tablet (10 mg total) by mouth daily. 90 tablet 2  . fesoterodine ([name]) 8 MG TB24 tablet TAKE 1 TABLET (8 MG TOTAL) BY MOUTH DAILY. 90 tablet 1  . glucose blood (ACCU-CHEK GUIDE) test strip Use as instructed to check blood sugar once daily 100 each 1  . levETIRAcetam (KEPPRA) 500 MG tablet TAKE 1 TABLET(500 MG) BY MOUTH TWICE  DAILY 60 tablet 1  . levothyroxine (SYNTHROID) 25 MCG tablet Take 1 tablet (25 mcg total) by mouth daily before breakfast. 90 tablet 1  . loperamide (IMODIUM A-D) 2 MG capsule Take 1 capsule (2 mg total) by mouth as needed for diarrhea or loose stools. 30 capsule 3  . loratadine (CLARITIN) 10 MG tablet Take 1 tablet (10 mg total) by mouth daily.    . meclizine (ANTIVERT) 25 MG tablet TAKE 1/2 TO 1 TABLET TWICE DAILY AS NEEDED FOR DIZZINESS (Patient taking differently: Take 12.5-25 mg by mouth 2 (two) times daily as needed for dizziness. ) 60 tablet 0  . metFORMIN (GLUCOPHAGE) 500 MG tablet TAKE 1 TABLET EVERY DAY WITH BREAKFAST 90 tablet 3  . mirabegron ER ([name]) 50 MG TB24 tablet Take 1 tablet (50 mg total) by mouth at bedtime. 90 tablet 1  . oxybutynin (DITROPAN-XL) 5 MG 24 hr tablet Take 1 tablet (5 mg total) by mouth at  bedtime. 30 tablet 6  . propranolol (INDERAL) 10 MG tablet Take 1 tablet (10 mg total) by mouth 2 (two) times daily. 180 tablet 1  . sertraline (ZOLOFT) 100 MG tablet Take 1 tablet (100 mg total) by mouth daily. 90 tablet 2  . Zinc Sulfate 140 (50 Zn) MG TABS Take 1 tablet by mouth daily. 90 tablet 3   No facility-administered medications prior to visit.    PAST MEDICAL HISTORY: Past Medical History:  Diagnosis Date  . Anomaly of cranium    frontal bossing, enlarged cranium, adult strabismus  . Diabetes mellitus type 2, controlled ([place]) 08/04/2013  . Heart disease   . History of chicken pox   . History of fainting   . 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 Hyperlipidemia 08/04/2013  . Hyponatremia 08/29/2014  . Preaxial polydactyly of right hand congenital  . Seizure disorder ([place]) 08/04/2013  . Tachycardia   . WPW (Wolff-Parkinson-White syndrome)    has seen Leah Wynonia Lawman    PAST SURGICAL HISTORY: Past Surgical History:  Procedure Laterality Date  . CHOLECYSTECTOMY  1978    FAMILY HISTORY: Family History  Problem Relation Age of Onset  . Stroke Mother 40  . Pneumonia Mother   . CAD Father 26  . Alzheimer's disease Father   . Pneumonia Father   . Cancer Maternal Aunt        lung (non-smoker)  . Diabetes Neg Hx     SOCIAL HISTORY: Social History   Socioeconomic History  . Marital status: Married    Spouse name: Not on file  . Number of children: Not on file  . Years of education: Not on file  . Highest education level: High school graduate  Occupational History  . Not on file  Tobacco Use  . Smoking status: Never Smoker  . Smokeless tobacco: Never Used  Vaping Use  . Vaping Use: Never used  Substance and Sexual Activity  . Alcohol use: No    Alcohol/week: 0.0 standard drinks  . Drug  use: No  . Sexual activity: Not on file  Other Topics Concern  . Not on file  Social History Narrative   Lives with husband, 2 dogs   Moved from [facility] [place]   Occupation: retired, was housewife   Edu: completed HS    Activity: no regular exercise. Dizziness precludes activity   Diet: good water,  fruits/vegetables daily   Caffeine: 2 cups daily   Social Determinants of Health   Financial Resource Strain: Low Risk   . Difficulty of Paying Living Expenses: Not very hard  Food Insecurity:   . Worried About Charity fundraiser in the Last Year: Not on file  . Ran Out of Food in the Last Year: Not on file  Transportation Needs:   . Lack of Transportation (Medical): Not on file  . Lack of Transportation (Non-Medical): Not on file  Physical Activity:   . Days of Exercise per Week: Not on file  . Minutes of Exercise per Session: Not on file  Stress:   . Feeling of Stress : Not on file  Social Connections:   . Frequency of Communication with Friends and Family: Not on file  . Frequency of Social Gatherings with Friends and Family: Not on file  . Attends Religious Services: Not on file  . Active Member of Clubs or Organizations: Not on file  . Attends Archivist Meetings: Not on file  . Marital Status: Not on file  Intimate Partner Violence:   . Fear of Current or Ex-Partner: Not on file  . Emotionally Abused: Not on file  . Physically Abused: Not on file  . Sexually Abused: Not on file      PHYSICAL EXAM  There were no vitals filed for this visit. There is no height or weight on file to calculate BMI.  Generalized: Well developed, in no acute distress  Cardiology: normal rate and rhythm, no murmur noted Respiratory: clear to auscultation bilaterally  Neurological examination  Mentation: Alert oriented to time, place, history taking. Follows all commands speech and language fluent Cranial nerve II-XII: Pupils were equal round reactive to light. Extraocular movements  were full, visual field were full on confrontational test. Facial sensation and strength were normal. Uvula tongue midline. Head turning and shoulder shrug  were normal and symmetric. Motor: The motor Payne reveals 5 over 5 strength of all 4 extremities. Good symmetric motor tone is noted throughout.  Sensory: Sensory Payne is intact to soft touch on all 4 extremities. No evidence of extinction is noted.  Coordination: Cerebellar Payne reveals good finger-nose-finger and heel-to-shin bilaterally.  Gait and station: Gait is normal. Tandem gait is normal. Romberg is negative. No drift is seen.  Reflexes: Deep tendon reflexes are symmetric and normal bilaterally.   DIAGNOSTIC DATA (LABS, IMAGING, Payne) - I reviewed patient records, labs, notes, Payne and imaging myself where available.  MMSE - Mini Mental State Exam 01/07/2019 03/27/2018 09/29/2017  Not completed: - - (No Data)  Orientation to time 5 3 -  Orientation to Place 5 4 -  Registration 3 3 -  Attention/ Calculation 0 5 -  Recall 2 3 -  Recall-comments unable to recall 1 of 3 words - -  Language- name 2 objects 0 2 -  Language- repeat 1 1 -  Language- follow 3 step command 0 2 -  Language- read & follow direction 0 1 -  Write a sentence 0 1 -  Copy design 0 0 -  Total score 16 25 -     Lab Results  Component Value Date   WBC 4.7 02/20/2020   HGB 12.4 02/20/2020   HCT 36.8 02/20/2020   MCV 85.2 02/20/2020   PLT 295.0 02/20/2020      Component Value Date/Time   NA 134 (L) 02/20/2020 1005   K 4.2 02/20/2020 1005   CL 101 02/20/2020 1005  CO2 28 02/20/2020 1005   GLUCOSE 100 (H) 02/20/2020 1005   BUN 8 02/20/2020 1005   CREATININE 0.71 02/20/2020 1005   CALCIUM 9.2 02/20/2020 1005   PROT 6.9 02/20/2020 1005   ALBUMIN 4.0 02/20/2020 1005   AST 13 02/20/2020 1005   AST 21 03/27/2015 0000   ALT 11 02/20/2020 1005   ALT 29 03/27/2015 0000   ALKPHOS 72 02/20/2020 1005   ALKPHOS 70 03/27/2015 0000   BILITOT 0.4  02/20/2020 1005   BILITOT 0.7 03/27/2015 0000   GFRNONAA >60 07/26/2019 1007   GFRAA >60 07/26/2019 1007   Lab Results  Component Value Date   CHOL 147 02/20/2020   HDL 46.50 02/20/2020   LDLCALC 79 02/20/2020   LDLDIRECT 100.0 09/17/2015   TRIG 106.0 02/20/2020   CHOLHDL 3 02/20/2020   Lab Results  Component Value Date   HGBA1C 6.3 02/20/2020   Lab Results  Component Value Date   VITAMINB12 470 08/07/2019   Lab Results  Component Value Date   TSH 2.84 02/20/2020       ASSESSMENT AND PLAN 74 y.o. year old female  has a past medical history of Anomaly of cranium, Diabetes mellitus type 2, controlled ([place]) (08/04/2013), Heart disease, History of chicken pox, History of fainting, Hydrocephalus in adult [facility]) (08/29/2014), Hyperlipidemia (08/04/2013), Hyponatremia (08/29/2014), Preaxial polydactyly of right hand (congenital), Seizure disorder ([place]) (08/04/2013), Tachycardia, and WPW (Wolff-Parkinson-White syndrome). here with ***  No diagnosis found.     No orders of the defined types were placed in this encounter.    No orders of the defined types were placed in this encounter.     I spent 15 minutes with the patient. 50% of this time was spent counseling and educating patient on plan of care and medications.    Debbora Presto, FNP-C 04/20/2020, 10:23 AM [facility] [address] [place], [place] [address] [phone]

## 2020-04-21 ENCOUNTER — Encounter: Payer: Self-pay | Admitting: Family Medicine

## 2020-04-21 ENCOUNTER — Ambulatory Visit: Payer: Medicare HMO | Admitting: Family Medicine

## 2020-04-30 ENCOUNTER — Telehealth: Payer: Self-pay

## 2020-04-30 NOTE — Chronic Care Management (AMB) (Signed)
Chronic Care Management Pharmacy Assistant   Name: Leah Payne  MRN: [id number] DOB: 02-23-1946  Reason for Encounter: Medication Review/ Monthly Dispensing Call  Patient Questions:  1.  Have you seen any other providers since your last visit? No  2.  Any changes in your medicines or health? No  PCP : [name], MD  Allergies:  No Known Allergies  Medications: Outpatient Encounter Medications as of 04/30/2020  Medication Sig  . Accu-Chek FastClix Lancets MISC Use as instructed to check blood sugar once daily  . acetaminophen (TYLENOL) 500 MG tablet Take 1 tablet (500 mg total) by mouth every 8 (eight) hours as needed.  . Alcohol Swabs (B-D SINGLE USE SWABS REGULAR) PADS Use as instructed to check blood sugar once daily  . atorvastatin (LIPITOR) 40 MG tablet Take 1 tablet (40 mg total) by mouth daily.  Marland Kitchen azelastine (ASTELIN) 0.1 % nasal spray Place 1 spray into both nostrils 2 (two) times daily. Use in each nostril as directed  . Blood Glucose Monitoring Suppl (ACCU-CHEK GUIDE) w/Device KIT 1 each by Does not apply route as directed. Use as instructed to check blood sugar once daily  . Cholecalciferol (VITAMIN D) 50 MCG (2000 UT) CAPS Take 1 capsule (2,000 Units total) by mouth daily.  Marland Kitchen donepezil (ARICEPT) 10 MG tablet Take 1 tablet (10 mg total) by mouth daily.  . fesoterodine ([name]) 8 MG TB24 tablet TAKE 1 TABLET (8 MG TOTAL) BY MOUTH DAILY.  Marland Kitchen glucose blood (ACCU-CHEK GUIDE) test strip Use as instructed to check blood sugar once daily  . levETIRAcetam (KEPPRA) 500 MG tablet TAKE 1 TABLET(500 MG) BY MOUTH TWICE DAILY  . levothyroxine (SYNTHROID) 25 MCG tablet Take 1 tablet (25 mcg total) by mouth daily before breakfast.  . loperamide (IMODIUM A-D) 2 MG capsule Take 1 capsule (2 mg total) by mouth as needed for diarrhea or loose stools.  Marland Kitchen loratadine (CLARITIN) 10 MG tablet Take 1 tablet (10 mg total) by mouth daily.  . meclizine (ANTIVERT) 25 MG tablet TAKE 1/2 TO  1 TABLET TWICE DAILY AS NEEDED FOR DIZZINESS (Patient taking differently: Take 12.5-25 mg by mouth 2 (two) times daily as needed for dizziness. )  . metFORMIN (GLUCOPHAGE) 500 MG tablet TAKE 1 TABLET EVERY DAY WITH BREAKFAST  . mirabegron ER ([name]) 50 MG TB24 tablet Take 1 tablet (50 mg total) by mouth at bedtime.  Marland Kitchen oxybutynin (DITROPAN-XL) 5 MG 24 hr tablet Take 1 tablet (5 mg total) by mouth at bedtime.  . propranolol (INDERAL) 10 MG tablet Take 1 tablet (10 mg total) by mouth 2 (two) times daily.  . sertraline (ZOLOFT) 100 MG tablet Take 1 tablet (100 mg total) by mouth daily.  . Zinc Sulfate 140 (50 Zn) MG TABS Take 1 tablet by mouth daily.   No facility-administered encounter medications on file as of 04/30/2020.    Current Diagnosis: Patient Active Problem List   Diagnosis Date Noted  . Family circumstance 02/26/2020  . Asymmetrical hearing loss of right ear 02/25/2020  . Rhinorrhea 02/25/2020  . 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[place]) 09/19/2019  . Chronic diarrhea 05/06/2019  . 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 [place]idal intertrigo 02/16/2018  . Fall at home, initial encounter 08/01/2017  . Abdominal distension 05/16/2017  . Vitamin D deficiency 03/21/2017  . Medicare annual wellness visit, subsequent 09/23/2016  . Health maintenance examination 09/23/2016  . Advanced care planning/counseling discussion 09/23/2016  . Dizziness 03/23/2016  . Alzheimer disease ([place]) 12/17/2015  . Imbalance 12/17/2015  .  WPW (Wolff-Parkinson-White syndrome)   . Type 2 diabetes, controlled, 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[place]) 09/17/2015  . Hypothyroidism 09/17/2015  . Overactive bladder 08/29/2014  . Seizure disorder ([place]) 08/04/2013  . 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[place]) 08/04/2013  . Hypertension 08/04/2013  . Hyperlipidemia associated with type 2 diabetes mellitus ([place]) 08/04/2013    Goals Addressed   None    Reviewed chart for medication changes ahead of medication coordination call.  No OVs,  Consults, or hospital visits since last care coordination call/Pharmacist visit. No medication changes indicated.  BP Readings from Last 3 Encounters:  02/24/20 124/78  09/19/19 136/80  07/31/19 (!) 161/96    Lab Results  Component Value Date   HGBA1C 6.3 02/20/2020     Patient obtains medications through Vials  30 Days   Last adherence delivery included: Atorvastatin 40 mg- 1 tablet daily, Vitamin D 50 mcg -1 tablet daily, Donepezil 10 mg -1 tablet daily, [name] 8 mg -1 tablet daily, Levetiracetam 500 mg -1 tablet twice a day, [name] 50 mg -1 tablet daily, Sertraline 100 mg -1 tablet daily, Levothyroxine 25 mcg -1 tablet daily, Propranolol 10 mg -1 tablet twice daily.  Patient declined the following medications last month: Metformin 500 mg- 1 tablet daily due to using medication received from local pharmacy, Azelastine Spray 1% due to using as needed and Loratadine 10 mg due to getting over the counter for as needed use.  Patient is due for next adherence delivery on: 05/08/2020.  Called patient and reviewed medications and coordinated delivery.  This delivery to include: Atorvastatin 40 mg- 1 tablet daily, Vitamin D 50 mcg -1 tablet daily, Donepezil 10 mg -1 tablet daily, [name] 8 mg -1 tablet daily, Levetiracetam 500 mg -1 tablet twice a day, [name] 50 mg -1 tablet daily, Sertraline 100 mg -1 tablet daily, Levothyroxine 25 mcg -1 tablet daily, Propranolol 10 mg -1 tablet twice daily, Metformin 500 mg- 1 tablet daily.  Patient declined the following medications: Azelastine Spray 1% due to using as needed and Loratadine 10 mg due to getting over the counter for as needed use.    Patient needs refills for:  [name] 50 mg -1 tablet daily, Donepezil 10 mg -1 tablet daily, Levothyroxine 25 mcg -1 tablet daily.  Confirmed delivery date of 05/08/2020, advised patient that pharmacy will contact them the morning of delivery.  Follow-Up:  Coordination of Enhanced  Pharmacy Services and Pharmacist Review- Patient is now needing Metformin filled from [facility], prescription is available to be refilled at the pharmacy. Donette Larry, CPP notified.  Pattricia Boss, [place] Pharmacist Assistant [phone]

## 2020-05-01 ENCOUNTER — Telehealth: Payer: Self-pay

## 2020-05-01 MED ORDER — MIRABEGRON ER 50 MG PO TB24
50.0000 mg | ORAL_TABLET | Freq: Every day | ORAL | 1 refills | Status: DC
Start: 2020-05-01 — End: 2020-10-28

## 2020-05-01 NOTE — Telephone Encounter (Addendum)
E-scribed refill.   ----- Message from Earvin Hansen, [facility] sent at 05/01/2020  8:16 AM EDT ----- Regarding: Refill Request Happy Friday!   Leah Payne is due for her delivery next week and needs a refill for [name] if possible.   Thanks so much for all of your help! Juliane Lack, PharmD, [facility] Clinical Pharmacist [facility] [phone] (office) [phone] (mobile)

## 2020-05-04 ENCOUNTER — Telehealth: Payer: Self-pay

## 2020-05-04 NOTE — Telephone Encounter (Signed)
Nurse Social Worker from [employer] called asking if we could start the process of getting the pt a home health aide or getting her in to a facility because her husband is in the hospital and he is struggling to take care of her. There were several issues including there is no phone at home right now if we were to start home health going to the home. When the husband gets home from the hospital, he will call us with more details as to what we really need to do.

## 2020-05-22 ENCOUNTER — Telehealth: Payer: Self-pay | Admitting: Physician Assistant

## 2020-05-22 NOTE — Telephone Encounter (Signed)
I connected by phone with [name] and/or patient's caregiver on 05/22/2020 at 2:38 PM to discuss the potential vaccination through our Homebound vaccination initiative.   Prevaccination Checklist for COVID-19 Vaccines  1.  Are you feeling sick today? no  2.  Have you ever received a dose of a COVID-19 vaccine?  no      If yes, which one? None   3.  Have you ever had an allergic reaction: (This would include a severe reaction [ e.g., anaphylaxis] that required treatment with epinephrine or EpiPen or that caused you to go to the hospital.  It would also include an allergic reaction that occurred within 4 hours that caused hives, swelling, or respiratory distress, including wheezing.) A.  A previous dose of COVID-19 vaccine. no  B.  A vaccine or injectable therapy that contains multiple components, one of which is a COVID-19 vaccine component, but it is not known which component elicited the immediate reaction. no  C.  Are you allergic to polyethylene glycol? no  D. Are you allergic to Polysorbate, which is found in some vaccines, film coated tablets and intravenous steroids?  no   4.  Have you ever had an allergic reaction to another vaccine (other than COVID-19 vaccine) or an injectable medication? (This would include a severe reaction [ e.g., anaphylaxis] that required treatment with epinephrine or EpiPen or that caused you to go to the hospital.  It would also include an allergic reaction that occurred within 4 hours that caused hives, swelling, or respiratory distress, including wheezing.)  no   5.  Have you ever had a severe allergic reaction (e.g., anaphylaxis) to something other than a component of the COVID-19 vaccine, or any vaccine or injectable medication?  This would include food, pet, venom, environmental, or oral medication allergies.  no   6.  Have you received any vaccine in the last 14 days? no   7.  Have you ever had a positive test for COVID-19 or has a doctor ever  told you that you had COVID-19?  no   8.  Have you received passive antibody therapy (monoclonal antibodies or convalescent serum) as a treatment for COVID-19? no   9.  Do you have a weakened immune system caused by something such as HIV infection or cancer or do you take immunosuppressive drugs or therapies?  no   10.  Do you have a bleeding disorder or are you taking a blood thinner? no   11.  Are you pregnant or breast-feeding? no   12.  Do you have dermal fillers? no   __________________   This patient is a 74 y.o. female that meets the FDA criteria to receive homebound vaccination. Patient or parent/caregiver understands they have the option to accept or refuse homebound vaccination.  Patient passed the pre-screening checklist and would like to proceed with homebound vaccination.  Based on questionnaire above, I recommend the patient be observed for 15 minutes.  There are an estimated 1 other household members/caregivers who are also interested in receiving the vaccine. (husband who has been separately screened)   I will send the patient's information to our scheduling team who will reach out to schedule the patient and potential caregiver/family members for homebound vaccination.   Cline Crock 05/22/2020 2:38 PM

## 2020-05-22 NOTE — Telephone Encounter (Signed)
Great - thanks

## 2020-05-25 ENCOUNTER — Other Ambulatory Visit: Payer: Self-pay

## 2020-05-25 ENCOUNTER — Encounter: Payer: Self-pay | Admitting: Family Medicine

## 2020-05-25 ENCOUNTER — Ambulatory Visit (INDEPENDENT_AMBULATORY_CARE_PROVIDER_SITE_OTHER): Payer: Medicare Other | Admitting: Family Medicine

## 2020-05-25 VITALS — BP 140/86 | HR 70 | Temp 97.7°F | Ht 59.5 in | Wt 145.5 lb

## 2020-05-25 DIAGNOSIS — Z9181 History of falling: Secondary | ICD-10-CM

## 2020-05-25 DIAGNOSIS — R5381 Other malaise: Secondary | ICD-10-CM

## 2020-05-25 DIAGNOSIS — N3281 Overactive bladder: Secondary | ICD-10-CM

## 2020-05-25 DIAGNOSIS — G309 Alzheimer's disease, unspecified: Secondary | ICD-10-CM

## 2020-05-25 DIAGNOSIS — G40909 Epilepsy, unspecified, not intractable, without status epilepticus: Secondary | ICD-10-CM

## 2020-05-25 DIAGNOSIS — Q039 Congenital hydrocephalus, unspecified: Secondary | ICD-10-CM

## 2020-05-25 DIAGNOSIS — Z639 Problem related to primary support group, unspecified: Secondary | ICD-10-CM

## 2020-05-25 DIAGNOSIS — F0280 Dementia in other diseases classified elsewhere without behavioral disturbance: Secondary | ICD-10-CM

## 2020-05-25 DIAGNOSIS — R2689 Other abnormalities of gait and mobility: Secondary | ICD-10-CM

## 2020-05-25 MED ORDER — OXYBUTYNIN CHLORIDE ER 5 MG PO TB24
5.0000 mg | ORAL_TABLET | Freq: Every day | ORAL | 1 refills | Status: DC
Start: 2020-05-25 — End: 2020-10-28

## 2020-05-25 NOTE — Patient Instructions (Addendum)
Oxybutynin XR 5mg  should replace toviaz.  Send application form for Medicaid assistance at home and we will fill out.  Good to see you today.  Call to schedule follow up with neurology.

## 2020-05-25 NOTE — Progress Notes (Signed)
This visit was conducted in person.  BP 140/86 (BP Location: Left Arm, Patient Position: Sitting, Cuff Size: Normal)   Pulse 70   Temp 97.7 F (36.5 C) (Temporal)   Ht 4' 11.5" (1.511 m)   Wt 145 lb 8 oz (66 kg)   SpO2 96%   BMI 28.90 kg/m    CC: Medicaid assistance in the home Subjective:    Patient ID: Leah Payne, female    DOB: [birth year], 74 y.o.   MRN: [id number]  HPI: Leah Payne is a 74 y.o. female presenting on 05/25/2020 for Follow-up (Needs to see PCP for Medicaid to help with getting assistance in the home.   Pt accompanied by husband, [name]- temp 98.1.) and Nausea (Per husband, pt had some nausea/vomiting 2-3 wks ago.  Sxs have resolved. )   Dementia with seizure disorder - established with neurology Dr Jaynee Eagles on aricept $RemoveBe'10mg'yBOMckCXj$  daily, keppra $RemoveBefor'500mg'LhYlUsfcBPoB$  bid, sertraline $RemoveBeforeD'100mg'gpwVFspCihfnGm$  daily.   Nausea with vomiting x3 this month. This last happened 2 wks ago.   No recent falls or near falls.  Denies pain anywhere. No arthralgias, chest pain, abd pain, dyspnea.   Increased struggle at home given disability and chronic medical conditions of pt and husband - husband is having trouble caring for wife given his own chronic medical conditions.   They did not bring application form but it will be sent to me after they fill out their part.   Geriatric Assessment: Activities of Daily Living:     Bathing- independent - hand bath (they only have tub at home)     Dressing- independent     Eating- independent     Toileting- independent     Transferring- independent      Continence- independent  Overall Assessment: independent   Instrumental Activities of Daily Living:     Transportation- dependent     Meal/Food Preparation- dependent     Shopping Errands- dependent     Housekeeping/Chores- dependent     Money Management/Finances- dependent     Medication Management- dependent     Ability to Use Telephone- independent     Laundry- dependent  Overall Assessment: dependent           Relevant past medical, surgical, family and social history reviewed and updated as indicated. Interim medical history since our last visit reviewed. Allergies and medications reviewed and updated. Outpatient Medications Prior to Visit  Medication Sig Dispense Refill  . Accu-Chek FastClix Lancets MISC Use as instructed to check blood sugar once daily 100 each 1  . acetaminophen (TYLENOL) 500 MG tablet Take 1 tablet (500 mg total) by mouth every 8 (eight) hours as needed.  0  . Alcohol Swabs (B-D SINGLE USE SWABS REGULAR) PADS Use as instructed to check blood sugar once daily 100 each 1  . atorvastatin (LIPITOR) 40 MG tablet Take 1 tablet (40 mg total) by mouth daily. 90 tablet 3  . azelastine (ASTELIN) 0.1 % nasal spray Place 1 spray into both nostrils 2 (two) times daily. Use in each nostril as directed 30 mL 12  . Blood Glucose Monitoring Suppl (ACCU-CHEK GUIDE) w/Device KIT 1 each by Does not apply route as directed. Use as instructed to check blood sugar once daily 1 kit 0  . Cholecalciferol (VITAMIN D) 50 MCG (2000 UT) CAPS Take 1 capsule (2,000 Units total) by mouth daily. 30 capsule   . donepezil (ARICEPT) 10 MG tablet Take 1 tablet (10 mg total) by mouth daily. 90 tablet 2  .  glucose blood (ACCU-CHEK GUIDE) test strip Use as instructed to check blood sugar once daily 100 each 1  . levETIRAcetam (KEPPRA) 500 MG tablet TAKE 1 TABLET(500 MG) BY MOUTH TWICE DAILY 60 tablet 1  . levothyroxine (SYNTHROID) 25 MCG tablet Take 1 tablet (25 mcg total) by mouth daily before breakfast. 90 tablet 1  . loperamide (IMODIUM A-D) 2 MG capsule Take 1 capsule (2 mg total) by mouth as needed for diarrhea or loose stools. 30 capsule 3  . loratadine (CLARITIN) 10 MG tablet Take 1 tablet (10 mg total) by mouth daily.    . meclizine (ANTIVERT) 25 MG tablet TAKE 1/2 TO 1 TABLET TWICE DAILY AS NEEDED FOR DIZZINESS (Patient taking differently: Take 12.5-25 mg by mouth 2 (two) times daily as needed for  dizziness. ) 60 tablet 0  . metFORMIN (GLUCOPHAGE) 500 MG tablet TAKE 1 TABLET EVERY DAY WITH BREAKFAST 90 tablet 3  . mirabegron ER ([name]) 50 MG TB24 tablet Take 1 tablet (50 mg total) by mouth at bedtime. 90 tablet 1  . propranolol (INDERAL) 10 MG tablet Take 1 tablet (10 mg total) by mouth 2 (two) times daily. 180 tablet 1  . sertraline (ZOLOFT) 100 MG tablet Take 1 tablet (100 mg total) by mouth daily. 90 tablet 2  . Zinc Sulfate 140 (50 Zn) MG TABS Take 1 tablet by mouth daily. 90 tablet 3  . fesoterodine ([name]) 8 MG TB24 tablet TAKE 1 TABLET (8 MG TOTAL) BY MOUTH DAILY. 90 tablet 1  . oxybutynin (DITROPAN-XL) 5 MG 24 hr tablet Take 1 tablet (5 mg total) by mouth at bedtime. 30 tablet 6   No facility-administered medications prior to visit.     Per HPI unless specifically indicated in ROS section below Review of Systems Objective:  BP 140/86 (BP Location: Left Arm, Patient Position: Sitting, Cuff Size: Normal)   Pulse 70   Temp 97.7 F (36.5 C) (Temporal)   Ht 4' 11.5" (1.511 m)   Wt 145 lb 8 oz (66 kg)   SpO2 96%   BMI 28.90 kg/m   Wt Readings from Last 3 Encounters:  05/25/20 145 lb 8 oz (66 kg)  02/24/20 149 lb (67.6 kg)  09/19/19 145 lb 8 oz (66 kg)      Physical Exam Vitals and nursing note reviewed.  Constitutional:      Appearance: Normal appearance. She is not ill-appearing.     Comments:  Disheveled  Ambulates with rolling walker  Eyes:     Extraocular Movements: Extraocular movements intact.     Pupils: Pupils are equal, round, and reactive to light.  Cardiovascular:     Rate and Rhythm: Normal rate and regular rhythm.     Pulses: Normal pulses.     Heart sounds: Normal heart sounds. No murmur heard.   Pulmonary:     Effort: Pulmonary effort is normal. No respiratory distress.     Breath sounds: Normal breath sounds. No wheezing, rhonchi or rales.  Musculoskeletal:     Right lower leg: No edema.     Left lower leg: No edema.     Comments:  Polydactyly  Skin:    General: Skin is warm and dry.     Findings: No rash.  Neurological:     Mental Status: She is alert.     Comments:  Slowed cautious gait with walker.  Needs assistance to get up from exam chair.   Psychiatric:        Mood and Affect: Mood normal.  Results for orders placed or performed in visit on 02/24/20  Microalbumin / creatinine urine ratio  Result Value Ref Range   Microalb, Ur <0.7 0.0 - 1.9 mg/dL   Creatinine,U 44.6 mg/dL   Microalb Creat Ratio 1.6 0.0 - 30.0 mg/g   Lab Results  Component Value Date   HGBA1C 6.3 02/20/2020    Assessment & Plan:  This visit occurred during the SARS-CoV-2 public health emergency.  Safety protocols were in place, including screening questions prior to the visit, additional usage of staff PPE, and extensive cleaning of exam room while observing appropriate contact time as indicated for disinfecting solutions.   Problem List Items Addressed This Visit    Seizure disorder ([place])    No recent seizures on keppra.       Overactive bladder    Has been taking both toviaz and oxybutynin as well as myrbetriq.  rec stop toviaz and only continue oxybutynin/myrbetriq.        Imbalance   History of fall    She is high risk for falls due to unsteadiness/imbalance. Ambulates with walker.       Family circumstance    Difficulty with care at home due to pt and husband's chronic medical issues. They would benefit from any further assistance provided to facilitate ability to stay living at home safely.       Debility - Primary    Would benefit from any extra assistance available to stay safely at home.  She is dependent for IADLs. Largely independent in ADLs - however based on today's exam she does need assistance with transfers, and she does not shower due to fall risk (they only have tub bath).       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[facility])   Alzheimer disease ([place])    Sees neuro. Continue aricept.           Meds ordered  this encounter  Medications  . oxybutynin (DITROPAN-XL) 5 MG 24 hr tablet    Sig: Take 1 tablet (5 mg total) by mouth at bedtime.    Dispense:  90 tablet    Refill:  1    To replace [name]   No orders of the defined types were placed in this encounter.  Patient Instructions  Oxybutynin XR $RemoveBefor'5mg'MvSEplwInlBY$  should replace toviaz.  Send [place] application form for Medicaid assistance at home and we will fill out.  Good to see you today.  Call to schedule follow up with neurology.    Follow up plan: Return in about 6 months (around 11/22/2020), or if symptoms worsen or fail to improve, for follow up visit.  [name], MD

## 2020-05-28 NOTE — Assessment & Plan Note (Signed)
She is high risk for falls due to unsteadiness/imbalance. Ambulates with walker.

## 2020-05-28 NOTE — Assessment & Plan Note (Addendum)
Difficulty with care at home due to pt and husband's chronic medical issues. They would benefit from any further assistance provided to facilitate ability to stay living at home safely.

## 2020-05-28 NOTE — Assessment & Plan Note (Addendum)
Has been taking both toviaz and oxybutynin as well as myrbetriq.  rec stop [name] and only continue oxybutynin/myrbetriq.

## 2020-05-28 NOTE — Assessment & Plan Note (Signed)
Sees neuro. Continue aricept.

## 2020-05-28 NOTE — Assessment & Plan Note (Signed)
No recent seizures on keppra.

## 2020-05-28 NOTE — Assessment & Plan Note (Signed)
Would benefit from any extra assistance available to stay safely at home.  She is dependent for IADLs. Largely independent in ADLs - however based on today's exam she does need assistance with transfers, and she does not shower due to fall risk (they only have tub bath).

## 2020-05-29 ENCOUNTER — Other Ambulatory Visit: Payer: Self-pay

## 2020-05-29 NOTE — Telephone Encounter (Signed)
Keppra Last rx:  04/07/20, #60/1 Last OV: 05/25/20, debility Next OV:  none

## 2020-06-03 ENCOUNTER — Ambulatory Visit: Payer: Medicare Other

## 2020-06-03 ENCOUNTER — Telehealth: Payer: Self-pay

## 2020-06-03 NOTE — Progress Notes (Signed)
   Covid-19 Vaccination Clinic  Name:  ANELY SPIEWAK    MRN: [id number] DOB: 1946/02/15  06/03/2020  Ms. Schulke was observed post Covid-19 immunization for 15 minutes without incident. She was provided with Vaccine Information Sheet and instruction to access the V-Safe system.   Ms. Stivers was instructed to call 911 with any severe reactions post vaccine: Marland Kitchen Difficulty breathing  . Swelling of face and throat  . A fast heartbeat  . A bad rash all over body  . Dizziness and weakness

## 2020-06-03 NOTE — Progress Notes (Signed)
Chronic Care Management Pharmacy Assistant   Name: Leah Payne  MRN: [id number] DOB: January 27, 1946  Reason for Encounter: Medication Review  Patient Questions:  1.  Have you seen any other providers since your last visit? Yes, [facility]  2.  Any changes in your medicines or health? Yes, Pt needs assistance with care in home; Pt recently self d/c toviaz. Given Oxybutynin $RemoveBeforeDE'5mg'yucMnlxDwCgyQOu$  24 hr tablet-1 tablet by mouth at bedtime to replace [name].    Leah Payne,  74 y.o. , female presents for their Follow-Up CCM visit with the clinical pharmacist via telephone.  PCP : [name], MD  Allergies:  No Known Allergies  Medications: Outpatient Encounter Medications as of 06/03/2020  Medication Sig  . Accu-Chek FastClix Lancets MISC Use as instructed to check blood sugar once daily  . acetaminophen (TYLENOL) 500 MG tablet Take 1 tablet (500 mg total) by mouth every 8 (eight) hours as needed.  . Alcohol Swabs (B-D SINGLE USE SWABS REGULAR) PADS Use as instructed to check blood sugar once daily  . atorvastatin (LIPITOR) 40 MG tablet Take 1 tablet (40 mg total) by mouth daily.  Marland Kitchen azelastine (ASTELIN) 0.1 % nasal spray Place 1 spray into both nostrils 2 (two) times daily. Use in each nostril as directed  . Blood Glucose Monitoring Suppl (ACCU-CHEK GUIDE) w/Device KIT 1 each by Does not apply route as directed. Use as instructed to check blood sugar once daily  . Cholecalciferol (VITAMIN D) 50 MCG (2000 UT) CAPS Take 1 capsule (2,000 Units total) by mouth daily.  Marland Kitchen donepezil (ARICEPT) 10 MG tablet Take 1 tablet (10 mg total) by mouth daily.  Marland Kitchen glucose blood (ACCU-CHEK GUIDE) test strip Use as instructed to check blood sugar once daily  . levETIRAcetam (KEPPRA) 500 MG tablet TAKE 1 TABLET(500 MG) BY MOUTH TWICE DAILY  . levothyroxine (SYNTHROID) 25 MCG tablet Take 1 tablet (25 mcg total) by mouth daily before breakfast.  . loperamide (IMODIUM A-D) 2 MG capsule Take 1 capsule (2 mg total) by  mouth as needed for diarrhea or loose stools.  Marland Kitchen loratadine (CLARITIN) 10 MG tablet Take 1 tablet (10 mg total) by mouth daily.  . meclizine (ANTIVERT) 25 MG tablet TAKE 1/2 TO 1 TABLET TWICE DAILY AS NEEDED FOR DIZZINESS (Patient taking differently: Take 12.5-25 mg by mouth 2 (two) times daily as needed for dizziness. )  . metFORMIN (GLUCOPHAGE) 500 MG tablet TAKE 1 TABLET EVERY DAY WITH BREAKFAST  . mirabegron ER ([name]) 50 MG TB24 tablet Take 1 tablet (50 mg total) by mouth at bedtime.  Marland Kitchen oxybutynin (DITROPAN-XL) 5 MG 24 hr tablet Take 1 tablet (5 mg total) by mouth at bedtime.  . propranolol (INDERAL) 10 MG tablet Take 1 tablet (10 mg total) by mouth 2 (two) times daily.  . sertraline (ZOLOFT) 100 MG tablet Take 1 tablet (100 mg total) by mouth daily.  . Zinc Sulfate 140 (50 Zn) MG TABS Take 1 tablet by mouth daily.   No facility-administered encounter medications on file as of 06/03/2020.    Current Diagnosis: Patient Active Problem List   Diagnosis Date Noted  . Family circumstance 02/26/2020  . Asymmetrical hearing loss of right ear 02/25/2020  . Rhinorrhea 02/25/2020  . 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 Chronic diarrhea 05/06/2019  . 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 Candidal intertrigo 02/16/2018  . History of fall 08/01/2017  . Abdominal distension 05/16/2017  . Vitamin D deficiency 03/21/2017  . Medicare annual wellness visit, subsequent 09/23/2016  .  Health maintenance examination 09/23/2016  . Advanced care planning/counseling discussion 09/23/2016  . Dizziness 03/23/2016  . Alzheimer disease ([place]) 12/17/2015  . Imbalance 12/17/2015  . WPW (Wolff-Parkinson-White syndrome)   . 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 Hypothyroidism 09/17/2015  . Debility   . Overactive bladder 08/29/2014  . Seizure disorder ([place]) 08/04/2013  . 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[place]) 08/04/2013  . Hypertension 08/04/2013  . Hyperlipidemia  associated with type 2 diabetes mellitus ([place]) 08/04/2013    Goals Addressed   None     Follow-Up:  Coordination of Enhanced Pharmacy Services  .Marland Kitchen Reviewed chart for medication changes ahead of medication coordination call.  No OVs, Consults, or hospital visits since last care coordination call/Pharmacist visit. (If appropriate, list visit date, provider name)  No medication changes indicated OR if recent visit, treatment plan here.  BP Readings from Last 3 Encounters:  05/25/20 140/86  02/24/20 124/78  09/19/19 136/80    Lab Results  Component Value Date   HGBA1C 6.3 02/20/2020     Patient obtains medications through Vials  30 Days   Last adherence delivery included:  Atorvastatin 40 mg- 1 tablet daily, Vitamin D 50 mcg -1 tablet daily, Donepezil 10 mg -1 tablet daily, [name] 8 mg -1 tablet daily, Levetiracetam 500 mg -1 tablet twice a day, [name] 50 mg -1 tablet daily, Sertraline 100 mg -1 tablet daily, Levothyroxine 25 mcg -1 tablet daily, Propranolol 10 mg -1 tablet twice daily, Metformin 500 mg- 1 tablet daily   Patient declined Azelastine Spray 1% due to using as needed and Loratadine 10 mg due to getting over the counter for as needed use  Patient is due for next adherence delivery on: 06/08/2020. Reviewed medications. Debbora Dus to coordinate delivery needs

## 2020-06-04 MED ORDER — LEVETIRACETAM 500 MG PO TABS
ORAL_TABLET | ORAL | 1 refills | Status: DC
Start: 2020-06-04 — End: 2020-10-28

## 2020-06-04 NOTE — Telephone Encounter (Signed)
Refilled. Last saw neurology Lucia Gaskins) 11/2018

## 2020-06-08 ENCOUNTER — Telehealth: Payer: Self-pay

## 2020-06-08 MED ORDER — LEVOTHYROXINE SODIUM 25 MCG PO TABS: 25.0000 ug | ORAL_TABLET | Freq: Every day | ORAL | 2 refills | Status: DC

## 2020-06-08 NOTE — Telephone Encounter (Signed)
E-scribed refill 

## 2020-06-08 NOTE — Telephone Encounter (Signed)
-----   Message from [place], [facility] sent at 06/08/2020  1:52 PM EST ----- Regarding: Refill request Refill needed on levothyroxine 25 mcg to [facility].  Thanks,  Marcelino Duster

## 2020-06-08 NOTE — Telephone Encounter (Signed)
Pt's husband, [name], dropped off a Request for Independent Assessment For [employer] form on 05/28/20.  Completed form was faxed today to [facility] at [phone] (alt fax # [phone]).  Phn # [phone] or [phone].

## 2020-06-30 ENCOUNTER — Telehealth: Payer: Self-pay

## 2020-06-30 NOTE — Chronic Care Management (AMB) (Addendum)
Chronic Care Management Pharmacy Assistant   Name: Leah Payne  MRN: [id number] DOB: [birth year]  Reason for Encounter: Medication Review  Patient Questions:  1.  Have you seen any other providers since your last visit? No  2.  Any changes in your medicines or health? No   PCP : [name], MD  Allergies:  No Known Allergies  Medications: Outpatient Encounter Medications as of 06/30/2020  Medication Sig   Accu-Chek FastClix Lancets MISC Use as instructed to check blood sugar once daily   acetaminophen (TYLENOL) 500 MG tablet Take 1 tablet (500 mg total) by mouth every 8 (eight) hours as needed.   Alcohol Swabs (B-D SINGLE USE SWABS REGULAR) PADS Use as instructed to check blood sugar once daily   atorvastatin (LIPITOR) 40 MG tablet Take 1 tablet (40 mg total) by mouth daily.   azelastine (ASTELIN) 0.1 % nasal spray Place 1 spray into both nostrils 2 (two) times daily. Use in each nostril as directed   Blood Glucose Monitoring Suppl (ACCU-CHEK GUIDE) w/Device KIT 1 each by Does not apply route as directed. Use as instructed to check blood sugar once daily   Cholecalciferol (VITAMIN D) 50 MCG (2000 UT) CAPS Take 1 capsule (2,000 Units total) by mouth daily.   donepezil (ARICEPT) 10 MG tablet Take 1 tablet (10 mg total) by mouth daily.   glucose blood (ACCU-CHEK GUIDE) test strip Use as instructed to check blood sugar once daily   levETIRAcetam (KEPPRA) 500 MG tablet TAKE 1 TABLET(500 MG) BY MOUTH TWICE DAILY   levothyroxine (SYNTHROID) 25 MCG tablet Take 1 tablet (25 mcg total) by mouth daily before breakfast.   loperamide (IMODIUM A-D) 2 MG capsule Take 1 capsule (2 mg total) by mouth as needed for diarrhea or loose stools.   loratadine (CLARITIN) 10 MG tablet Take 1 tablet (10 mg total) by mouth daily.   meclizine (ANTIVERT) 25 MG tablet TAKE 1/2 TO 1 TABLET TWICE DAILY AS NEEDED FOR DIZZINESS (Patient taking differently: Take 12.5-25 mg by mouth 2 (two) times daily as  needed for dizziness. )   metFORMIN (GLUCOPHAGE) 500 MG tablet TAKE 1 TABLET EVERY DAY WITH BREAKFAST   mirabegron ER ([name]) 50 MG TB24 tablet Take 1 tablet (50 mg total) by mouth at bedtime.   oxybutynin (DITROPAN-XL) 5 MG 24 hr tablet Take 1 tablet (5 mg total) by mouth at bedtime.   propranolol (INDERAL) 10 MG tablet Take 1 tablet (10 mg total) by mouth 2 (two) times daily.   sertraline (ZOLOFT) 100 MG tablet Take 1 tablet (100 mg total) by mouth daily.   Zinc Sulfate 140 (50 Zn) MG TABS Take 1 tablet by mouth daily.   No facility-administered encounter medications on file as of 06/30/2020.    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/2019   Candidal intertrigo 02/16/2018   History of fall 08/01/2017   Abdominal distension 05/16/2017   Vitamin D deficiency 03/21/2017   Medicare annual wellness visit, subsequent 09/23/2016   Health maintenance examination 09/23/2016   Advanced care planning/counseling discussion 09/23/2016   Dizziness 03/23/2016   Alzheimer disease ([place]) 12/17/2015   Imbalance 12/17/2015   WPW (Wolff-Parkinson-White syndrome)    Type 2 diabetes, controlled, 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  No OVs, Consults, or hospital visits since last care coordination call/Pharmacist visit.  No medication changes indicated.  BP Readings from Last 3 Encounters:  05/25/20 140/86  02/24/20 124/78   09/19/19 136/80    Lab Results  Component Value Date   HGBA1C 6.3 02/20/2020     Patient obtains medications through Vials  30 Days  Previously in adherence packaging, prefers vials due to med changes  Last adherence delivery included:  Atorvastatin 40 mg- 1 tablet daily Donepezil 10 mg - 1 tablet daily Sertraline 100 mg - 1 tablet daily Levetiracetam 500 mg - 1 tablet twice a day Levothyroxine 25 mcg - 1 tablet daily Metformin 500 mg- 1 tablet daily  [name] 50 mg - 1 tablet daily Propranolol 10 mg - 1 tablet twice daily [name] 8 mg - 1 tablet daily Vitamin D3 2000 units - 1 tablet daily  Patient declined the following medications last month: Azelastine Spray 1% due to using as needed Loratadine 10 mg due to getting over the counter for as needed use   Patient is due for next adherence delivery on: 07/08/20.  Called patient and reviewed medications and coordinated delivery.  This delivery to include: Atorvastatin 40 mg- 1 tablet daily Donepezil 10 mg - 1 tablet daily Sertraline 100 mg - 1 tablet daily Levetiracetam 500 mg - 1 tablet twice a day Levothyroxine 25 mcg - 1 tablet daily Loratadine 10 mg 1 tablet every morning Metformin 500 mg- 1 tablet daily  [name] 50 mg - 1 tablet daily Propranolol 10 mg - 1 tablet twice daily Vitamin D3 2000 units - 1 tablet daily  Patient will not need a short fill or acute fill prior to adherence delivery  Patient declined the following medications: Oxybutynin ER 5 mg 1 tablet at bedtime --> received 90 DS from [facility] on 05/27/20 [name] 8 mg - discontinued --> replaced with oxybutynin + [name] Azelastine Spray 1% due to using as needed  Patient needs refills for Donepezil 10 mg - 1 tablet daily  Confirmed delivery date of 07/08/20, advised patient that pharmacy will contact them the morning of delivery.  Follow-Up:  Comptroller and Pharmacist Review   Leah Payne, [place]  Pharmacy Assistant [phone]  Reviewed by: Debbora Dus, PharmD Clinical Pharmacist [facility] at [facility] [phone]

## 2020-07-01 ENCOUNTER — Other Ambulatory Visit: Payer: Self-pay

## 2020-07-01 NOTE — Telephone Encounter (Signed)
Donepezil Last rx:  10/22/19, #90/2 Last OV:  05/25/20, debility Next OV:  none

## 2020-07-01 NOTE — Telephone Encounter (Signed)
-----   Message from [place], [facility] sent at 06/30/2020 12:08 PM EST ----- Regarding: Refill request Leah Payne needs the following medication refills: donepezil 10 mg   Please send refills to [facility].  Thank you,  Phil Dopp, PharmD Clinical Pharmacist [place] [facility] at [facility] [phone]

## 2020-07-02 MED ORDER — DONEPEZIL HCL 10 MG PO TABS
10.0000 mg | ORAL_TABLET | Freq: Every day | ORAL | 2 refills | Status: DC
Start: 2020-07-02 — End: 2021-05-11

## 2020-07-13 ENCOUNTER — Ambulatory Visit: Payer: Self-pay

## 2020-07-13 ENCOUNTER — Telehealth: Payer: Medicare HMO

## 2020-07-13 DIAGNOSIS — E1169 Type 2 diabetes mellitus with other specified complication: Secondary | ICD-10-CM

## 2020-07-13 DIAGNOSIS — E1142 Type 2 diabetes mellitus with diabetic polyneuropathy: Secondary | ICD-10-CM

## 2020-07-13 NOTE — Chronic Care Management (AMB) (Deleted)
Chronic Care Management Pharmacy  Name: Leah Payne  MRN: [id number] DOB: [birth year]    Chief Complaint/ HPI  Leah Payne,  74 y.o. , female presents for their Initial CCM visit with the clinical pharmacist In office. Patient presented with her husband, Leah Payne.   PCP : Leah Bush, MD  Their chronic conditions include: HTN, HLD, type 2 diabetes, hypothyroidism, alzheimer disease, WPW, overactive bladder, seizure disorder  Office Visits:  05/25/20: PCP visit - taking [name], oxybutynin and [name], Stop [name]. Apply for medicaid assistance in home.  09/19/19 PCP - rehab f/u visit. DM2 stable on metformin once daily. Due for an eye exam, OAB stable on [place] and myrbetriq. Imbalance, home health involved. Alzheimer disease - continue on aricept 5 mg. F/u in 5 months for annual exam.   Consult Visit: none in past 6 months  Medications: Outpatient Encounter Medications as of 07/13/2020  Medication Sig  . Accu-Chek FastClix Lancets MISC Use as instructed to check blood sugar once daily  . acetaminophen (TYLENOL) 500 MG tablet Take 1 tablet (500 mg total) by mouth every 8 (eight) hours as needed.  . Alcohol Swabs (B-D SINGLE USE SWABS REGULAR) PADS Use as instructed to check blood sugar once daily  . atorvastatin (LIPITOR) 40 MG tablet Take 1 tablet (40 mg total) by mouth daily.  Marland Kitchen azelastine (ASTELIN) 0.1 % nasal spray Place 1 spray into both nostrils 2 (two) times daily. Use in each nostril as directed  . Blood Glucose Monitoring Suppl (ACCU-CHEK GUIDE) w/Device KIT 1 each by Does not apply route as directed. Use as instructed to check blood sugar once daily  . Cholecalciferol (VITAMIN D) 50 MCG (2000 UT) CAPS Take 1 capsule (2,000 Units total) by mouth daily.  Marland Kitchen donepezil (ARICEPT) 10 MG tablet Take 1 tablet (10 mg total) by mouth daily.  Marland Kitchen glucose blood (ACCU-CHEK GUIDE) test strip Use as instructed to check blood sugar once daily  . levETIRAcetam (KEPPRA) 500 MG  tablet TAKE 1 TABLET(500 MG) BY MOUTH TWICE DAILY  . levothyroxine (SYNTHROID) 25 MCG tablet Take 1 tablet (25 mcg total) by mouth daily before breakfast.  . loperamide (IMODIUM A-D) 2 MG capsule Take 1 capsule (2 mg total) by mouth as needed for diarrhea or loose stools.  Marland Kitchen loratadine (CLARITIN) 10 MG tablet Take 1 tablet (10 mg total) by mouth daily.  . meclizine (ANTIVERT) 25 MG tablet TAKE 1/2 TO 1 TABLET TWICE DAILY AS NEEDED FOR DIZZINESS (Patient taking differently: Take 12.5-25 mg by mouth 2 (two) times daily as needed for dizziness. )  . metFORMIN (GLUCOPHAGE) 500 MG tablet TAKE 1 TABLET EVERY DAY WITH BREAKFAST  . mirabegron ER ([name]) 50 MG TB24 tablet Take 1 tablet (50 mg total) by mouth at bedtime.  Marland Kitchen oxybutynin (DITROPAN-XL) 5 MG 24 hr tablet Take 1 tablet (5 mg total) by mouth at bedtime.  . propranolol (INDERAL) 10 MG tablet Take 1 tablet (10 mg total) by mouth 2 (two) times daily.  . sertraline (ZOLOFT) 100 MG tablet Take 1 tablet (100 mg total) by mouth daily.  . Zinc Sulfate 140 (50 Zn) MG TABS Take 1 tablet by mouth daily.   No facility-administered encounter medications on file as of 07/13/2020.   Current Diagnosis/Assessment:   Goals Addressed   None    Hypertension   Office blood pressures are  BP Readings from Last 3 Encounters:  05/25/20 140/86  02/24/20 124/78  09/19/19 136/80   BP goal < 140/90 mmHg Patient has failed  these meds in the past: none Patient is currently controlled on the following medications:  . Propranolol 10 mg - 1 tablet twice daily  Patient checks BP at home: no home monitor Patient home BP readings are ranging: none reported  Pertinent history: Per chart review, h/o tachycardia with WPW  Plan: Continue current medications   Hyperlipidemia   Lipid Panel     Component Value Date/Time   CHOL 147 02/20/2020 1005   CHOL 146 03/27/2015 0000   TRIG 106.0 02/20/2020 1005   TRIG 215 03/27/2015 0000   HDL 46.50 02/20/2020 1005    CHOLHDL 3 02/20/2020 1005   VLDL 21.2 02/20/2020 1005   LDLCALC 79 02/20/2020 1005   LDLCALC 101 03/27/2015 0000   LDLDIRECT 100.0 09/17/2015 1522    The 10-year ASCVD risk score Mikey Bussing DC Jr., et al., 2013) is: 41.2%   Values used to calculate the score:     Age: 16 years     Sex: Female     Is Non-Hispanic African American: No     Diabetic: Yes     Tobacco smoker: No     Systolic Blood Pressure: 539 mmHg     Is BP treated: Yes     HDL Cholesterol: 46.5 mg/dL     Total Cholesterol: 147 mg/dL   LDL goal < 100 Patient has failed these meds in past: none Patient is currently controlled on the following medications:  . Atorvastatin 40 mg - 1 tablet once daily  We discussed: confirms adherence, denies concerns   Pla: Continue current medications  Diabetes   Recent Relevant Labs: Lab Results  Component Value Date/Time   HGBA1C 6.3 02/20/2020 10:05 AM   HGBA1C 6.2 08/07/2019 12:00 AM   HGBA1C 6.2 01/31/2019 09:27 AM   GFR 80.28 02/20/2020 10:05 AM   GFR 84.63 01/31/2019 09:27 AM   MICROALBUR <0.7 02/24/2020 01:03 PM   MICROALBUR 0.7 10/06/2017 08:49 AM    Checking BG: Daily Recent FBG Readings: 100s  Patient has failed these meds in past: none Patient is currently controlled on the following medications:  Marland Kitchen Metformin 500 mg -  1 tablet once daily with breakfast  We discussed: Due for eye and foot exam   Plan: Continue current medications   Alzheimer disease   Patient has failed these meds in past: none Patient is currently controlled on the following medications:  . Donepezil 10 mg - 1 tablet once daily . Sertraline 100 mg - 1 tablet once daily  We discussed:  Takes donepezil every morning. Denies nausea, does report some diarrhea (once weekly - takes Imodium). Reports doing well on both sertraline and donepezil, memory and mood stable.   Plan: Continue current medications   Overactive bladder   Patient has failed these meds in past: none Patient is  currently uncontrolled on the following medications:  . Oxybutynin ER 5 mg - 1 tablet once daily at bedtime . [name] 50 mg - 1 tablet once daily at bedtime  We discussed: Pt reports difficulty urinating. May take up to 30 minutes to void and often feels like she has to urinate but unable. She has been on dual therapy > 5 years. Her and her husband would like to know if she could trial monotherapy.  Plan: Consult with PCP regarding trial off [name] due to urinary retention symptoms.    Seizure disorder   Patient has failed these meds in past: none Patient is currently controlled on the following medications:  . Levetiracetam 500 mg -  1 tablet twice a day  We discussed: reports seizure free since January 2015; due for annual visit with neurology   Plan: Continue current medications   Vitamin D Deficiency   Lab Results  Component Value Date   VD25OH 39.10 02/20/2020   Patient has failed these meds in past: none Patient is currently controlled on the following medications:   Vitamin D3 2000 units - 1 capsule daily  We discussed: currently out of medication, encouraged to resume  Plan: Continue current medications   Hypothyroidism   Lab Results  Component Value Date/Time   TSH 2.84 02/20/2020 10:05 AM   TSH 3.24 08/07/2019 12:00 AM   TSH 1.740 07/26/2019 10:07 AM   TSH 1.95 01/31/2019 09:27 AM   FREET4 0.91 01/31/2019 09:27 AM   Patient has failed these meds in past: none  Patient is currently controlled on the following medications:  . Levothyroxine 25 mcg  - 1 tablet daily   We discussed:  Confirmed adherence, denies concerns. TSH WNL despite taking at same time as other morning medications, may continue current administration time.   Plan: Continue current medications  OTCs/Health Maintenance   Patient is currently on the following medications:  Accu Chek Guide monitor  Accu Chek Test strips  . Accu Chek FastClix lancets . Loratadine 10 mg - 1 tablet once  daily   PRN use: Marland Kitchen Tylenol 500 mg - 1 tablet every 8 hrs as needed (occasional use) . Loperamide 2 mg - 1 capsule as needed for diarrhea or loose stools (uses about once/week)  Not taking:  Zinc sulfate 140 mg - 1 tablet daily  Meclizine 25 mg - 0.5 to 1 tablet twice daily PRN dizziness   We discussed: appropriate to continue above medications   Plan: Continue current medications   Vaccines   Reviewed and discussed patient's vaccination history.    Immunization History  Administered Date(s) Administered  . Fluad Quad(high Dose 65+) 04/30/2019, 04/07/2020  . Influenza, High Dose Seasonal PF 03/31/2017  . Influenza,inj,Quad PF,6+ Mos 03/23/2016, 04/05/2018  . Influenza-Unspecified 03/26/2015  . Janssen (J&J) SARS-COV-2 Vaccination 06/03/2020  . Pneumococcal Conjugate-13 08/25/2016  . Pneumococcal Polysaccharide-23 03/16/2015   Plan: Recommended patient receive Shingrix   Medication Management   Patient is currently using [facility] for medication coordination and delivery Adherence: Much improved since transition to [facility] Follow up: 6 month phone visit  Debbora Dus, PharmD Clinical Pharmacist [place] [facility] at [facility] [phone]     .

## 2020-07-13 NOTE — Progress Notes (Signed)
I have collaborated with the care management provider regarding care management and care coordination activities outlined in this encounter and have reviewed this encounter including documentation in the note and care plan. I am certifying that I agree with the content of this note and encounter as supervising physician.  

## 2020-07-13 NOTE — Chronic Care Management (AMB) (Signed)
  Chronic Care Management   Outreach Note  07/13/2020 Name: Leah Payne MRN: [id number] DOB: [birth year]  PCP: Eustaquio Boyden, MD  Attempted to reach patient by cell and husbands contact number to complete CCM follow up visit for comprehensive medication review. Unable to leave voicemail as mailbox was full. Patient has contact information for rescheduling. Will continue contact monthly for medication adherence coordination of delivery. 15 minutes spent reviewing chart for medication changes, recent office visits, updates labs prior to call.  Phil Dopp, PharmD Clinical Pharmacist [place] [facility] at [facility] [phone]

## 2020-07-14 DIAGNOSIS — H5213 Myopia, bilateral: Secondary | ICD-10-CM | POA: Diagnosis not present

## 2020-08-04 ENCOUNTER — Telehealth: Payer: Self-pay

## 2020-08-04 NOTE — Chronic Care Management (AMB) (Addendum)
Chronic Care Management Pharmacy Assistant   Name: Leah Payne  MRN: [id number] DOB: Aug 11, 1945  Reason for Encounter: Medication Review  Patient Questions:  1.  Have you seen any other providers since your last visit? No  2.  Any changes in your medicines or health? No    PCP : [name], MD  Allergies:  No Known Allergies  Medications: Outpatient Encounter Medications as of 08/04/2020  Medication Sig   Accu-Chek FastClix Lancets MISC Use as instructed to check blood sugar once daily   acetaminophen (TYLENOL) 500 MG tablet Take 1 tablet (500 mg total) by mouth every 8 (eight) hours as needed.   Alcohol Swabs (B-D SINGLE USE SWABS REGULAR) PADS Use as instructed to check blood sugar once daily   atorvastatin (LIPITOR) 40 MG tablet Take 1 tablet (40 mg total) by mouth daily.   azelastine (ASTELIN) 0.1 % nasal spray Place 1 spray into both nostrils 2 (two) times daily. Use in each nostril as directed   Blood Glucose Monitoring Suppl (ACCU-CHEK GUIDE) w/Device KIT 1 each by Does not apply route as directed. Use as instructed to check blood sugar once daily   Cholecalciferol (VITAMIN D) 50 MCG (2000 UT) CAPS Take 1 capsule (2,000 Units total) by mouth daily.   donepezil (ARICEPT) 10 MG tablet Take 1 tablet (10 mg total) by mouth daily.   glucose blood (ACCU-CHEK GUIDE) test strip Use as instructed to check blood sugar once daily   levETIRAcetam (KEPPRA) 500 MG tablet TAKE 1 TABLET(500 MG) BY MOUTH TWICE DAILY   levothyroxine (SYNTHROID) 25 MCG tablet Take 1 tablet (25 mcg total) by mouth daily before breakfast.   loperamide (IMODIUM A-D) 2 MG capsule Take 1 capsule (2 mg total) by mouth as needed for diarrhea or loose stools.   loratadine (CLARITIN) 10 MG tablet Take 1 tablet (10 mg total) by mouth daily.   meclizine (ANTIVERT) 25 MG tablet TAKE 1/2 TO 1 TABLET TWICE DAILY AS NEEDED FOR DIZZINESS (Patient taking differently: Take 12.5-25 mg by mouth 2 (two) times daily as  needed for dizziness. )   metFORMIN (GLUCOPHAGE) 500 MG tablet TAKE 1 TABLET EVERY DAY WITH BREAKFAST   mirabegron ER ([name]) 50 MG TB24 tablet Take 1 tablet (50 mg total) by mouth at bedtime.   oxybutynin (DITROPAN-XL) 5 MG 24 hr tablet Take 1 tablet (5 mg total) by mouth at bedtime.   propranolol (INDERAL) 10 MG tablet Take 1 tablet (10 mg total) by mouth 2 (two) times daily.   sertraline (ZOLOFT) 100 MG tablet Take 1 tablet (100 mg total) by mouth daily.   No facility-administered encounter medications on file as of 08/04/2020.    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   Candidal intertrigo 02/16/2018   History of fall 08/01/2017   Abdominal distension 05/16/2017   Vitamin D deficiency 03/21/2017   Medicare annual wellness visit, subsequent 09/23/2016   Health maintenance examination 09/23/2016   Advanced care planning/counseling discussion 09/23/2016   Dizziness 03/23/2016   Alzheimer disease ([place]) 12/17/2015   Imbalance 12/17/2015   WPW (Wolff-Parkinson-White syndrome)    Type 2 diabetes, controlled, 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[place]) 09/17/2015   Hypothyroidism 09/17/2015   Debility    Overactive bladder 08/29/2014   Seizure disorder ([place]) 08/04/2013  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  No OVs, Consults, or hospital visits since last care coordination call/Pharmacist visit.  No medication changes indicated.  BP Readings from Last 3 Encounters:  05/25/20 140/86  02/24/20 124/78  09/19/19 136/80    Lab Results  Component Value Date    HGBA1C 6.3 02/20/2020     Patient obtains medications through Vials  90 Days (requested change to 90 DS today)  Last adherence delivery included: 07/06/20 30 DS  Atorvastatin 40 mg- 1 tablet daily Donepezil 10 mg - 1 tablet daily Levetiracetam 500 mg - 1 tablet twice a day Levothyroxine 25 mcg - 1 tablet daily Loratadine 10 mg 1 tablet every morning Metformin 500 mg- 1 tablet daily  [name] 50 mg - 1 tablet daily Propranolol 10 mg - 1 tablet twice daily Sertraline 100 mg - 1 tablet daily Vitamin D3 2000 units - 1 tablet daily   Patient declined the following medications last month:  Azelastine Spray 1% due to using as needed Oxybutynin ER 5 mg 1 tablet at bedtime --> received 90 DS from [facility] on 05/27/20 [name] 8 mg - discontinued --> replaced with oxybutynin + [name]  Patient is due for next adherence delivery on: 08/05/2020.  Called patient and reviewed medications and coordinated delivery.  This delivery to include: 90 DS 08/05/20 Atorvastatin 40 mg- 1 tablet daily Donepezil 10 mg - 1 tablet daily Levetiracetam 500 mg - 1 tablet twice a day Levothyroxine 25 mcg - 1 tablet daily Metformin 500 mg- 1 tablet daily [name] 50 mg - 1 tablet daily Oxybutynin Chloride ER - 1 tablet bedtime  Propranolol 10 mg - 1 tablet twice daily Sertraline 100 mg - 1 tablet daily Vitamin D3 2000 units - 1 tablet daily  Patient declined the following medications: Azelastine Spray 1% due to using as needed Loratadine 10 mg 1 tablet every morning- husband states they just bought some OTC [name] 8 mg - discontinued --> replaced with oxybutynin + [name]  Patient needs refills for no medications.  Confirmed delivery date of 08/05/2020, advised patient that pharmacy will contact them the morning of delivery.  Follow-Up:  Coordination of Enhanced Pharmacy Services and Pharmacist Review   Patients husband states blood glucose has averaged about 106 per her meter. He does not have  individual readings at this time.   Debbora Dus, CPP notified  Margaretmary Dys, [place] Pharmacy Assistant [phone]  I have reviewed the care management and care coordination activities outlined in this encounter and I am certifying that I agree with the content of this note.  Contacted patient by telephone as patient's husband was declining multiple medications that were DUE and reported excess supply. Discussed importance of daily adherence. Reviewed expiration dates and the excess metformin,sertraline and propranolol were expired so patient discarded these.   Debbora Dus, PharmD Clinical Pharmacist [place] [facility] at [facility] [phone]

## 2020-08-05 ENCOUNTER — Telehealth: Payer: Self-pay

## 2020-08-05 MED ORDER — PROPRANOLOL HCL 10 MG PO TABS
10.0000 mg | ORAL_TABLET | Freq: Two times a day (BID) | ORAL | 2 refills | Status: DC
Start: 2020-08-05 — End: 2021-04-22

## 2020-08-05 NOTE — Telephone Encounter (Signed)
E-scribed refill 

## 2020-08-05 NOTE — Telephone Encounter (Signed)
-----   Message from Phil Dopp, [facility] sent at 08/04/2020  8:20 PM EST ----- Regarding: Refill Request Refill request propranolol. We have enough for 60 DS, but patient requesting 90 day supply. Send to [facility].   Thanks for all of your help!  Phil Dopp, PharmD Clinical Pharmacist [facility] at [facility] [phone]

## 2020-08-24 ENCOUNTER — Ambulatory Visit: Payer: Medicare Other | Admitting: Family Medicine

## 2020-08-28 ENCOUNTER — Telehealth: Payer: Self-pay

## 2020-08-28 NOTE — Chronic Care Management (AMB) (Addendum)
Chronic Care Management Pharmacy Assistant   Name: Leah Payne  MRN: [id number] DOB: 10-16-1945  Reason for Encounter: Medication Review- Medication Adherence and Delivery Coordination  Chart review: Has the patient seen any other providers since last adherence review? No  PCP : Leah Bush, MD  Allergies:  No Known Allergies  Medications: Outpatient Encounter Medications as of 08/28/2020  Medication Sig   Accu-Chek FastClix Lancets MISC Use as instructed to check blood sugar once daily   acetaminophen (TYLENOL) 500 MG tablet Take 1 tablet (500 mg total) by mouth every 8 (eight) hours as needed.   Alcohol Swabs (B-D SINGLE USE SWABS REGULAR) PADS Use as instructed to check blood sugar once daily   atorvastatin (LIPITOR) 40 MG tablet Take 1 tablet (40 mg total) by mouth daily.   azelastine (ASTELIN) 0.1 % nasal spray Place 1 spray into both nostrils 2 (two) times daily. Use in each nostril as directed   Blood Glucose Monitoring Suppl (ACCU-CHEK GUIDE) w/Device KIT 1 each by Does not apply route as directed. Use as instructed to check blood sugar once daily   Cholecalciferol (VITAMIN D) 50 MCG (2000 UT) CAPS Take 1 capsule (2,000 Units total) by mouth daily.   donepezil (ARICEPT) 10 MG tablet Take 1 tablet (10 mg total) by mouth daily.   glucose blood (ACCU-CHEK GUIDE) test strip Use as instructed to check blood sugar once daily   levETIRAcetam (KEPPRA) 500 MG tablet TAKE 1 TABLET(500 MG) BY MOUTH TWICE DAILY   levothyroxine (SYNTHROID) 25 MCG tablet Take 1 tablet (25 mcg total) by mouth daily before breakfast.   loperamide (IMODIUM A-D) 2 MG capsule Take 1 capsule (2 mg total) by mouth as needed for diarrhea or loose stools.   loratadine (CLARITIN) 10 MG tablet Take 1 tablet (10 mg total) by mouth daily.   meclizine (ANTIVERT) 25 MG tablet TAKE 1/2 TO 1 TABLET TWICE DAILY AS NEEDED FOR DIZZINESS (Patient taking differently: Take 12.5-25 mg by mouth 2 (two) times daily as  needed for dizziness. )   metFORMIN (GLUCOPHAGE) 500 MG tablet TAKE 1 TABLET EVERY DAY WITH BREAKFAST   mirabegron ER ([name]) 50 MG TB24 tablet Take 1 tablet (50 mg total) by mouth at bedtime.   oxybutynin (DITROPAN-XL) 5 MG 24 hr tablet Take 1 tablet (5 mg total) by mouth at bedtime.   propranolol (INDERAL) 10 MG tablet Take 1 tablet (10 mg total) by mouth 2 (two) times daily.   sertraline (ZOLOFT) 100 MG tablet Take 1 tablet (100 mg total) by mouth daily.   No facility-administered encounter medications on file as of 08/28/2020.    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[place]) 09/19/2019   Chronic diarrhea 05/06/2019   Polydactyly of thumb 01/31/2019   Candidal intertrigo 02/16/2018   History of fall 08/01/2017   Abdominal distension 05/16/2017   Vitamin D deficiency 03/21/2017   Medicare annual wellness visit, subsequent 09/23/2016   Health maintenance examination 09/23/2016   Advanced care planning/counseling discussion 09/23/2016   Dizziness 03/23/2016   Alzheimer disease ([place]) 12/17/2015   Imbalance 12/17/2015   WPW (Wolff-Parkinson-White syndrome)    Type 2 diabetes, controlled, 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  No OVs, Consults, or hospital visits since last care coordination call / Pharmacist visit. No medication changes indicated  BP Readings from Last 3 Encounters:  05/25/20 140/86  02/24/20 124/78  09/19/19 136/80    Lab Results  Component Value Date    HGBA1C 6.3 02/20/2020    Attempted to contact Leah Payne on 3 separate occasions to review adherence of medications. Multiple messages left for patient to return call.   Per pt's husband's best friend Leah Payne, Leah Payne is at home while her husband is in rehab facility for the next week. States that her neighbors have been checking on her some but no one is staying with her.   Patient obtains medications through [facility]  90 Days   Last adherence delivery date: 08/05/20, 90 DS Atorvastatin 40 mg- 1 tablet daily Donepezil 10 mg - 1 tablet daily Levetiracetam 500 mg - 1 tablet twice a day Levothyroxine 25 mcg - 1 tablet daily Metformin 500 mg- 1 tablet daily [name] 50 mg - 1 tablet daily Oxybutynin Chloride ER - 1 tablet bedtime  Propranolol 10 mg - 1 tablet twice daily Sertraline 100 mg - 1 tablet daily Vitamin D3 2000 units - 1 tablet daily  Patient declined the following medications last delivery: Azelastine Spray 1% due to using as needed Loratadine 10 mg 1 tablet every morning- OTC, just purchaes [name] 8 mg - discontinued --> replaced with oxybutynin + [name]  Patient is due for next adherence delivery on: 11/02/20   This delivery to include: NONE  The following medications not due this month: DUE TO RECEIVING 90 DS 08/05/20 Atorvastatin 40 mg- 1 tablet daily Donepezil 10 mg - 1 tablet daily Levetiracetam 500 mg - 1 tablet twice a day Levothyroxine 25 mcg - 1 tablet daily Metformin 500 mg- 1 tablet daily [name] 50 mg - 1 tablet daily Oxybutynin Chloride ER - 1 tablet bedtime  Propranolol 10 mg - 1 tablet twice daily Sertraline 100 mg - 1 tablet daily Vitamin D3 2000 units - 1 tablet daily  Follow-Up:  Coordination of Enhanced Pharmacy Services and Pharmacist Review  Leah Payne, CPP notified  Leah Payne, [place] [phone]  I have reviewed the care management and care coordination activities outlined in this  encounter and I am certifying that I agree with the content of this note. No further action required.  Leah Payne, PharmD Clinical Pharmacist [facility] at [facility] [phone]

## 2020-09-09 DIAGNOSIS — H524 Presbyopia: Secondary | ICD-10-CM | POA: Diagnosis not present

## 2020-09-09 DIAGNOSIS — H52223 Regular astigmatism, bilateral: Secondary | ICD-10-CM | POA: Diagnosis not present

## 2020-10-21 ENCOUNTER — Ambulatory Visit (INDEPENDENT_AMBULATORY_CARE_PROVIDER_SITE_OTHER): Payer: Medicare Other | Admitting: Family Medicine

## 2020-10-21 ENCOUNTER — Encounter: Payer: Self-pay | Admitting: Family Medicine

## 2020-10-21 ENCOUNTER — Other Ambulatory Visit: Payer: Self-pay

## 2020-10-21 VITALS — BP 126/82 | HR 61 | Temp 97.7°F | Ht 59.5 in | Wt 143.4 lb

## 2020-10-21 DIAGNOSIS — G40909 Epilepsy, unspecified, not intractable, without status epilepticus: Secondary | ICD-10-CM | POA: Diagnosis not present

## 2020-10-21 DIAGNOSIS — Z639 Problem related to primary support group, unspecified: Secondary | ICD-10-CM | POA: Diagnosis not present

## 2020-10-21 DIAGNOSIS — E785 Hyperlipidemia, unspecified: Secondary | ICD-10-CM | POA: Diagnosis not present

## 2020-10-21 DIAGNOSIS — N3281 Overactive bladder: Secondary | ICD-10-CM | POA: Diagnosis not present

## 2020-10-21 DIAGNOSIS — G309 Alzheimer's disease, unspecified: Secondary | ICD-10-CM

## 2020-10-21 DIAGNOSIS — I10 Essential (primary) hypertension: Secondary | ICD-10-CM | POA: Diagnosis not present

## 2020-10-21 DIAGNOSIS — E1169 Type 2 diabetes mellitus with other specified complication: Secondary | ICD-10-CM | POA: Diagnosis not present

## 2020-10-21 DIAGNOSIS — E1142 Type 2 diabetes mellitus with diabetic polyneuropathy: Secondary | ICD-10-CM

## 2020-10-21 DIAGNOSIS — F0280 Dementia in other diseases classified elsewhere without behavioral disturbance: Secondary | ICD-10-CM

## 2020-10-21 LAB — POCT GLYCOSYLATED HEMOGLOBIN (HGB A1C): Hemoglobin A1C: 6 % — AB (ref 4.0–5.6)

## 2020-10-21 MED ORDER — ONDANSETRON HCL 4 MG PO TABS: 4.0000 mg | ORAL_TABLET | Freq: Three times a day (TID) | ORAL | 0 refills | Status: DC | PRN

## 2020-10-21 MED ORDER — TOVIAZ 8 MG PO TB24
ORAL_TABLET | ORAL | 1 refills | Status: DC
Start: 2020-10-21 — End: 2020-11-16

## 2020-10-21 NOTE — Patient Instructions (Addendum)
May use zofran as needed for nausea.  Will price out [name] in place of oxybutynin - to see if insurance will approve.  Sugars are well controlled Continue current medicines.  Good to see you today  Return in 6 months for physical

## 2020-10-21 NOTE — Assessment & Plan Note (Signed)
Chronic, stable. Continue propranolol. 

## 2020-10-21 NOTE — Assessment & Plan Note (Signed)
Continue working towards getting further assistance at home as patient is unable to care for herself, husband who acts as caregiver has his own chronic medical illnesses.

## 2020-10-21 NOTE — Assessment & Plan Note (Signed)
Overdue for neurology f/u. No recent seizures on keppra 500mg  bid.

## 2020-10-21 NOTE — Assessment & Plan Note (Signed)
Stable period on aricept.

## 2020-10-21 NOTE — Progress Notes (Signed)
Patient ID: Leah Payne, female    DOB: Aug 02, 1945, 75 y.o.   MRN: [id number]  This visit was conducted in person.  BP 126/82   Pulse 61   Temp 97.7 F (36.5 C) (Temporal)   Ht 4' 11.5" (1.511 m)   Wt 143 lb 7 oz (65.1 kg)   SpO2 99%   BMI 28.49 kg/m    CC: f/u visit Subjective:   HPI: Leah Payne is a 75 y.o. female presenting on 10/21/2020 for Follow-up (Pt accompanied by husband, Leah Payne- temp 97.8.)   Last seen 05/2020.  Receiving medications through [facility].   Urinary incontinence - she stopped toviaz last year, in its place we started oxybutynin 5mg  XR nightly - also takes myrbetriq 50mg  nightly. Occasionally wets pad at night time. They note Leah Payne worked better than oxybutynin.   Dementia with seizure disorder - established with neurology Dr Jaynee Eagles on aricept 10mg  daily, keppra 500mg  bid, sertraline 100mg  daily. Not seen in >1 yr - due for f/u.   Intermittent nausea/vomiting - twice in the past month. No abd pain, fevers with this. Pepto bismol helps. Doesn't have nausea medication. No known inciting food.   She received COVID J&J vaccine 05/2020.      Relevant past medical, surgical, family and social history reviewed and updated as indicated. Interim medical history since our last visit reviewed. Allergies and medications reviewed and updated. Outpatient Medications Prior to Visit  Medication Sig Dispense Refill  . Accu-Chek FastClix Lancets MISC Use as instructed to check blood sugar once daily 100 each 1  . Alcohol Swabs (B-D SINGLE USE SWABS REGULAR) PADS Use as instructed to check blood sugar once daily 100 each 1  . atorvastatin (LIPITOR) 40 MG tablet Take 1 tablet (40 mg total) by mouth daily. 90 tablet 3  . Blood Glucose Monitoring Suppl (ACCU-CHEK GUIDE) w/Device KIT 1 each by Does not apply route as directed. Use as instructed to check blood sugar once daily 1 kit 0  . Cholecalciferol (VITAMIN D) 50 MCG (2000 UT) CAPS Take 1 capsule  (2,000 Units total) by mouth daily. 30 capsule   . donepezil (ARICEPT) 10 MG tablet Take 1 tablet (10 mg total) by mouth daily. 90 tablet 2  . glucose blood (ACCU-CHEK GUIDE) test strip Use as instructed to check blood sugar once daily 100 each 1  . levETIRAcetam (KEPPRA) 500 MG tablet TAKE 1 TABLET(500 MG) BY MOUTH TWICE DAILY 180 tablet 1  . levothyroxine (SYNTHROID) 25 MCG tablet Take 1 tablet (25 mcg total) by mouth daily before breakfast. 90 tablet 2  . loperamide (IMODIUM A-D) 2 MG capsule Take 1 capsule (2 mg total) by mouth as needed for diarrhea or loose stools. 30 capsule 3  . loratadine (CLARITIN) 10 MG tablet Take 1 tablet (10 mg total) by mouth daily.    . metFORMIN (GLUCOPHAGE) 500 MG tablet TAKE 1 TABLET EVERY DAY WITH BREAKFAST 90 tablet 3  . mirabegron ER ([name]) 50 MG TB24 tablet Take 1 tablet (50 mg total) by mouth at bedtime. 90 tablet 1  . oxybutynin (DITROPAN-XL) 5 MG 24 hr tablet Take 1 tablet (5 mg total) by mouth at bedtime. 90 tablet 1  . propranolol (INDERAL) 10 MG tablet Take 1 tablet (10 mg total) by mouth 2 (two) times daily. 180 tablet 2  . sertraline (ZOLOFT) 100 MG tablet Take 1 tablet (100 mg total) by mouth daily. 90 tablet 2  . acetaminophen (TYLENOL) 500 MG tablet Take 1 tablet (  500 mg total) by mouth every 8 (eight) hours as needed.  0  . azelastine (ASTELIN) 0.1 % nasal spray Place 1 spray into both nostrils 2 (two) times daily. Use in each nostril as directed 30 mL 12  . meclizine (ANTIVERT) 25 MG tablet TAKE 1/2 TO 1 TABLET TWICE DAILY AS NEEDED FOR DIZZINESS (Patient taking differently: Take 12.5-25 mg by mouth 2 (two) times daily as needed for dizziness. ) 60 tablet 0   No facility-administered medications prior to visit.     Per HPI unless specifically indicated in ROS section below Review of Systems Objective:  BP 126/82   Pulse 61   Temp 97.7 F (36.5 C) (Temporal)   Ht 4' 11.5" (1.511 m)   Wt 143 lb 7 oz (65.1 kg)   SpO2 99%   BMI 28.49  kg/m   Wt Readings from Last 3 Encounters:  10/21/20 143 lb 7 oz (65.1 kg)  05/25/20 145 lb 8 oz (66 kg)  02/24/20 149 lb (67.6 kg)      Physical Exam Vitals and nursing note reviewed.  Constitutional:      Appearance: Normal appearance. She is not ill-appearing.     Comments: Sitting in wheelchair  Cardiovascular:     Rate and Rhythm: Normal rate and regular rhythm.     Pulses: Normal pulses.     Heart sounds: Normal heart sounds. No murmur heard.   Pulmonary:     Effort: Pulmonary effort is normal. No respiratory distress.     Breath sounds: Normal breath sounds. No wheezing, rhonchi or rales.  Abdominal:     General: Bowel sounds are normal. There is no distension.     Palpations: Abdomen is soft. There is no mass.     Tenderness: There is no abdominal tenderness. There is no guarding or rebound.     Hernia: No hernia is present.  Musculoskeletal:     Right lower leg: No edema.     Left lower leg: No edema.  Skin:    General: Skin is warm and dry.     Findings: No rash.  Neurological:     Mental Status: She is alert.  Psychiatric:        Mood and Affect: Mood normal.        Behavior: Behavior normal.       Results for orders placed or performed in visit on 10/21/20  POCT glycosylated hemoglobin (Hb A1C)  Result Value Ref Range   Hemoglobin A1C 6.0 (A) 4.0 - 5.6 %   HbA1c POC (<> result, manual entry)     HbA1c, POC (prediabetic range)     HbA1c, POC (controlled diabetic range)     Assessment & Plan:  This visit occurred during the SARS-CoV-2 public health emergency.  Safety protocols were in place, including screening questions prior to the visit, additional usage of staff PPE, and extensive cleaning of exam room while observing appropriate contact time as indicated for disinfecting solutions.   Problem List Items Addressed This Visit    Seizure disorder [facility])    Overdue for neurology f/u. No recent seizures on keppra $RemoveB'500mg'TfvBpqNo$  bid.       Hypertension     Chronic, stable. Continue propranolol       Hyperlipidemia associated with type 2 diabetes mellitus ([place])   Overactive bladder    They note Leah Payne was more effective then oxybutynin XR - will send this in to price out.  Continue myrbetriq.       Type 2  diabetes, controlled, 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[place]) - Primary    Great control on low dose metformin.  Consider titrating off metformin if remaining well controlled.       Relevant Orders   POCT glycosylated hemoglobin (Hb A1C) (Completed)   Alzheimer disease ([place])    Stable period on aricept.       Family circumstance    Continue working towards getting further assistance at home as patient is unable to care for herself, husband who acts as caregiver has his own chronic medical illnesses.           Meds ordered this encounter  Medications  . ondansetron (ZOFRAN) 4 MG tablet    Sig: Take 1 tablet (4 mg total) by mouth every 8 (eight) hours as needed for nausea or vomiting.    Dispense:  20 tablet    Refill:  0  . fesoterodine ([name]) 8 MG TB24 tablet    Sig: TAKE 1 TABLET (8 MG TOTAL) BY MOUTH DAILY.    Dispense:  90 tablet    Refill:  1    To price out in place of oxybutynin XR as [name] more effective   Orders Placed This Encounter  Procedures  . POCT glycosylated hemoglobin (Hb A1C)    Patient Instructions  May use zofran as needed for nausea.  Will price out [name] in place of oxybutynin - to see if insurance will approve.  Sugars are well controlled Continue current medicines.  Good to see you today  Return in 6 months for physical    Follow up plan: Return in about 6 months (around 04/23/2021) for annual exam, prior fasting for blood work, medicare wellness visit.  [name], MD

## 2020-10-21 NOTE — Assessment & Plan Note (Signed)
Great control on low dose metformin.  Consider titrating off metformin if remaining well controlled.

## 2020-10-21 NOTE — Assessment & Plan Note (Signed)
They note Leah Payne was more effective then oxybutynin XR - will send this in to price out.  Continue myrbetriq.

## 2020-10-23 ENCOUNTER — Telehealth: Payer: Self-pay

## 2020-10-23 NOTE — Chronic Care Management (AMB) (Addendum)
Chronic Care Management Pharmacy Assistant   Name: Leah Payne  MRN: [id number] DOB: 1946/07/13  Reason for Encounter: Medication Review- Medication Adherence and Delivery Coordination   Recent office visits:  10/21/20- Dr. Danise Mina- PCP- Kevan Rosebush 8 mg daily/ discontinue oxybutynin   Recent consult visits:  None since last CCM contact  Hospital visits:  None in previous 6 months  Medications: Outpatient Encounter Medications as of 10/23/2020  Medication Sig   Accu-Chek FastClix Lancets MISC Use as instructed to check blood sugar once daily   Alcohol Swabs (B-D SINGLE USE SWABS REGULAR) PADS Use as instructed to check blood sugar once daily   atorvastatin (LIPITOR) 40 MG tablet Take 1 tablet (40 mg total) by mouth daily.   Blood Glucose Monitoring Suppl (ACCU-CHEK GUIDE) w/Device KIT 1 each by Does not apply route as directed. Use as instructed to check blood sugar once daily   Cholecalciferol (VITAMIN D) 50 MCG (2000 UT) CAPS Take 1 capsule (2,000 Units total) by mouth daily.   donepezil (ARICEPT) 10 MG tablet Take 1 tablet (10 mg total) by mouth daily.   fesoterodine ([name]) 8 MG TB24 tablet TAKE 1 TABLET (8 MG TOTAL) BY MOUTH DAILY.   glucose blood (ACCU-CHEK GUIDE) test strip Use as instructed to check blood sugar once daily   levETIRAcetam (KEPPRA) 500 MG tablet TAKE 1 TABLET(500 MG) BY MOUTH TWICE DAILY   levothyroxine (SYNTHROID) 25 MCG tablet Take 1 tablet (25 mcg total) by mouth daily before breakfast.   loperamide (IMODIUM A-D) 2 MG capsule Take 1 capsule (2 mg total) by mouth as needed for diarrhea or loose stools.   loratadine (CLARITIN) 10 MG tablet Take 1 tablet (10 mg total) by mouth daily.   metFORMIN (GLUCOPHAGE) 500 MG tablet TAKE 1 TABLET EVERY DAY WITH BREAKFAST   mirabegron ER ([name]) 50 MG TB24 tablet Take 1 tablet (50 mg total) by mouth at bedtime.   ondansetron (ZOFRAN) 4 MG tablet Take 1 tablet (4 mg total) by mouth every 8 (eight) hours as  needed for nausea or vomiting.   oxybutynin (DITROPAN-XL) 5 MG 24 hr tablet Take 1 tablet (5 mg total) by mouth at bedtime.   propranolol (INDERAL) 10 MG tablet Take 1 tablet (10 mg total) by mouth 2 (two) times daily.   sertraline (ZOLOFT) 100 MG tablet Take 1 tablet (100 mg total) by mouth daily.   No facility-administered encounter medications on file as of 10/23/2020.   BP Readings from Last 3 Encounters:  10/21/20 126/82  05/25/20 140/86  02/24/20 124/78    Lab Results  Component Value Date   HGBA1C 6.0 (A) 10/21/2020     Recent OV, Consult or Hospital visit:  10/21/20- Dr. Danise Mina- PCP Recent medication changes indicated:  Started [name] 8 mg daily  Patient obtains medications through Adherence Packaging  90 Days   Last adherence delivery date:08/05/20, 90 DS      Patient is due for next adherence delivery on: 11/04/20  Contacted patient and reviewed medications and coordinated delivery.   This delivery to include: Adherence Packaging  90 Days  90 DS packs: Atorvastatin 40 mg- 1 tablet daily (1 evening meal)  Donepezil 10 mg -?1 tablet daily (1 breakfast)  Levetiracetam 500 mg -?1 tablet twice a day (1 breakfast, 1 evening meal)  Levothyroxine 25 mcg -?1 tablet daily (1 breakfast)  Metformin 500 mg- 1 tablet daily (1 breakfast)  [name] 50 mg -?1 tablet daily (1 bedtime)  Propranolol 10 mg -?1 tablet twice daily (  1 breakfast, 1 evening meal)  Sertraline 100 mg -?1 tablet daily (1 breakfast)  [name] 8 mg - 1 tablet daily (1 evening meal)  Vitamin D3 2000 units -?1 tablet daily (1 breakfast)    Patient declined the following medications this month: PRN/OTC Azelastine Spray 1% due to using as needed Loratadine 10 mg 1 tablet every morning- OTC, patient buys Oxybutynin discontinued by PCP 10/21/20  Confirmed delivery date of 11/04/20, advised patient that pharmacy will contact her the morning of delivery.  Follow-Up:  Coordination of Enhanced Pharmacy Services and  Pharmacist Review  Debbora Dus, CPP notified  Margaretmary Dys, [place] Pharmacy Assistant [phone]  I have reviewed the care management and care coordination activities outlined in this encounter and I am certifying that I agree with the content of this note. No further action required.  Debbora Dus, PharmD Clinical Pharmacist [facility] at [facility] [phone]

## 2020-10-28 ENCOUNTER — Other Ambulatory Visit: Payer: Self-pay

## 2020-10-28 NOTE — Telephone Encounter (Signed)
Keppra last rx:  06/04/20, #180/1 Myrbetriz last rx:  05/01/20, #90/1 Oxybutynin last rx:  05/25/20, #90/1 Sertraline last rx:  03/06/20, #90/2  Last OV:  10/21/20, f/u Next OV:  04/27/21, AWV prt 2

## 2020-10-29 MED ORDER — MIRABEGRON ER 50 MG PO TB24: 50.0000 mg | ORAL_TABLET | Freq: Every day | ORAL | 1 refills | Status: DC

## 2020-10-29 MED ORDER — OXYBUTYNIN CHLORIDE ER 5 MG PO TB24: 5.0000 mg | ORAL_TABLET | Freq: Every day | ORAL | 1 refills | Status: DC

## 2020-10-29 MED ORDER — SERTRALINE HCL 100 MG PO TABS: 100.0000 mg | ORAL_TABLET | Freq: Every day | ORAL | 2 refills | Status: DC

## 2020-10-29 MED ORDER — LEVETIRACETAM 500 MG PO TABS: ORAL_TABLET | ORAL | 1 refills | Status: DC

## 2020-10-29 NOTE — Telephone Encounter (Signed)
ERx 

## 2020-11-06 DIAGNOSIS — R531 Weakness: Secondary | ICD-10-CM | POA: Diagnosis not present

## 2020-11-06 DIAGNOSIS — R5381 Other malaise: Secondary | ICD-10-CM | POA: Diagnosis not present

## 2020-11-15 ENCOUNTER — Encounter (HOSPITAL_COMMUNITY): Payer: Self-pay | Admitting: Emergency Medicine

## 2020-11-15 ENCOUNTER — Emergency Department (HOSPITAL_COMMUNITY): Payer: Medicare Other

## 2020-11-15 ENCOUNTER — Other Ambulatory Visit: Payer: Self-pay

## 2020-11-15 ENCOUNTER — Emergency Department (HOSPITAL_COMMUNITY)
Admission: EM | Admit: 2020-11-15 | Discharge: 2020-11-15 | Disposition: A | Discharge status: Home / Self Care | Payer: Medicare Other | Attending: Emergency Medicine | Admitting: Emergency Medicine

## 2020-11-15 DIAGNOSIS — Z743 Need for continuous supervision: Secondary | ICD-10-CM | POA: Diagnosis not present

## 2020-11-15 DIAGNOSIS — G309 Alzheimer's disease, unspecified: Secondary | ICD-10-CM | POA: Insufficient documentation

## 2020-11-15 DIAGNOSIS — E039 Hypothyroidism, unspecified: Secondary | ICD-10-CM | POA: Insufficient documentation

## 2020-11-15 DIAGNOSIS — R4182 Altered mental status, unspecified: Secondary | ICD-10-CM | POA: Diagnosis not present

## 2020-11-15 DIAGNOSIS — R410 Disorientation, unspecified: Secondary | ICD-10-CM | POA: Diagnosis not present

## 2020-11-15 DIAGNOSIS — R0902 Hypoxemia: Secondary | ICD-10-CM | POA: Diagnosis not present

## 2020-11-15 DIAGNOSIS — Z7984 Long term (current) use of oral hypoglycemic drugs: Secondary | ICD-10-CM | POA: Insufficient documentation

## 2020-11-15 DIAGNOSIS — I10 Essential (primary) hypertension: Secondary | ICD-10-CM | POA: Diagnosis not present

## 2020-11-15 DIAGNOSIS — Z79899 Other long term (current) drug therapy: Secondary | ICD-10-CM | POA: Diagnosis not present

## 2020-11-15 DIAGNOSIS — E1140 Type 2 diabetes mellitus with diabetic neuropathy, unspecified: Secondary | ICD-10-CM | POA: Insufficient documentation

## 2020-11-15 LAB — COMPREHENSIVE METABOLIC PANEL
ALT: 21 U/L (ref 0–44)
AST: 24 U/L (ref 15–41)
Albumin: 3.9 g/dL (ref 3.5–5.0)
Alkaline Phosphatase: 82 U/L (ref 38–126)
Anion gap: 10 (ref 5–15)
BUN: 20 mg/dL (ref 8–23)
CO2: 25 mmol/L (ref 22–32)
Calcium: 9.2 mg/dL (ref 8.9–10.3)
Chloride: 99 mmol/L (ref 98–111)
Creatinine, Ser: 1 mg/dL (ref 0.44–1.00)
GFR, Estimated: 59 mL/min — ABNORMAL LOW (ref >60)
Glucose, Bld: 123 mg/dL — ABNORMAL HIGH (ref 70–99)
Potassium: 3.7 mmol/L (ref 3.5–5.1)
Sodium: 134 mmol/L — ABNORMAL LOW (ref 135–145)
Total Bilirubin: 0.7 mg/dL (ref 0.3–1.2)
Total Protein: 7.3 g/dL (ref 6.5–8.1)

## 2020-11-15 LAB — CBC WITH DIFFERENTIAL/PLATELET
Abs Immature Granulocytes: 0.01 10*3/uL (ref 0.00–0.07)
Basophils Absolute: 0 10*3/uL (ref 0.0–0.1)
Basophils Relative: 0 %
Eosinophils Absolute: 0 10*3/uL (ref 0.0–0.5)
Eosinophils Relative: 0 %
HCT: 36.8 % (ref 36.0–46.0)
Hemoglobin: 12.1 g/dL (ref 12.0–15.0)
Immature Granulocytes: 0 %
Lymphocytes Relative: 7 %
Lymphs Abs: 0.4 10*3/uL — ABNORMAL LOW (ref 0.7–4.0)
MCH: 28.9 pg (ref 26.0–34.0)
MCHC: 32.9 g/dL (ref 30.0–36.0)
MCV: 88 fL (ref 80.0–100.0)
Monocytes Absolute: 0.2 10*3/uL (ref 0.1–1.0)
Monocytes Relative: 4 %
Neutro Abs: 4.3 10*3/uL (ref 1.7–7.7)
Neutrophils Relative %: 89 %
Platelets: 235 10*3/uL (ref 150–400)
RBC: 4.18 MIL/uL (ref 3.87–5.11)
RDW: 12.7 % (ref 11.5–15.5)
WBC: 4.9 10*3/uL (ref 4.0–10.5)
nRBC: 0 % (ref 0.0–0.2)

## 2020-11-15 LAB — URINALYSIS, ROUTINE W REFLEX MICROSCOPIC
Bilirubin Urine: NEGATIVE
Glucose, UA: NEGATIVE mg/dL
Hgb urine dipstick: NEGATIVE
Ketones, ur: NEGATIVE mg/dL
Leukocytes,Ua: NEGATIVE
Nitrite: NEGATIVE
Protein, ur: NEGATIVE mg/dL
Specific Gravity, Urine: 1.018 (ref 1.005–1.030)
pH: 6 (ref 5.0–8.0)

## 2020-11-15 NOTE — ED Triage Notes (Signed)
Pts husband called EMS due to pt having some confusion today. Pt has no complaints at this time. Pt alert and oriented on arrival to ED.

## 2020-11-15 NOTE — ED Notes (Signed)
MSE signature not obtained due to signature pad not working. Pt verbalizes understanding and agrees.

## 2020-11-15 NOTE — ED Provider Notes (Signed)
[facility] EMERGENC[facility] DEPARTMENT Provider Note   CSN: [id number] Arrival date & time: 11/15/20  1644     History Chief Complaint  Patient presents with  . Altered Mental Status    Leah Payne is a 75 y.o. female.  Pt presents to the ED today with AMS.  The pt's husband called EMS today due to some confusion.  Pt remembers feeling a little confused, but feels fine now.  Pt denies any complaints.        Past Medical History:  Diagnosis Date  . Anomaly of cranium    frontal bossing, enlarged cranium, adult strabismus  . Diabetes mellitus type 2, controlled (HCC) 08/04/2013  . Heart disease   . History of chicken pox   . History of fainting   . 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 Hyperlipidemia 08/04/2013  . Hyponatremia 08/29/2014  . Preaxial polydactyly of right hand congenital  . Seizure disorder (HCC) 08/04/2013  . Tachycardia   . WPW (Wolff-Parkinson-White syndrome)    has seen Dr Tilley    Patient Active Problem List   Diagnosis Date Noted  . Family circumstance 02/26/2020  . Asymmetrical hearing loss of right ear 02/25/2020  . Rhinorrhea 02/25/2020  . 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HCC) 09/19/2019  . Chronic diarrhea 05/06/2019  . 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 Candidal intertrigo 02/16/2018  . History of fall 08/01/2017  . Abdominal distension 05/16/2017  . Vitamin D deficiency 03/21/2017  . Medicare annual wellness visit, subsequent 09/23/2016  . Health maintenance examination 09/23/2016  . Advanced care planning/counseling discussion 09/23/2016  . Dizziness 03/23/2016  . Alzheimer disease (HCC) 12/17/2015  . Imbalance 12/17/2015  . WPW (Wolff-Parkinson-White syndrome)   . Type 2 diabetes, controlled, 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HCC) 09/17/2015  . Hypothyroidism 09/17/2015  .  Debility   . Overactive bladder 08/29/2014  . Seizure disorder (HCC) 08/04/2013  . 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HCC) 08/04/2013  . Hypertension 08/04/2013  . Hyperlipidemia associated with type 2 diabetes mellitus (HCC) 08/04/2013    Past Surgical History:  Procedure Laterality Date  . CHOLECYSTECTOMY  1978     OB History   No obstetric history on file.     Family History  Problem Relation Age of Onset  . Stroke Mother 92  . Pneumonia Mother   . CAD Father 79  . Alzheimer's disease Father   . Pneumonia Father   . Cancer Maternal Aunt        lung (non-smoker)  . Diabetes Neg Hx     Social History   Tobacco Use  . Smoking status: Never Smoker  . Smokeless tobacco: Never Used  Vaping Use  . Vaping Use: Never used  Substance Use Topics  . Alcohol use: No    Alcohol/week: 0.0 standard drinks  . Drug use: No    Home Medications Prior to Admission medications   Medication Sig Start Date End Date Taking? Authorizing Provider  Accu-Chek FastClix Lancets MISC Use as instructed to check blood sugar once daily 09/06/19   Gutierrez, Javier, MD  Alcohol Swabs (B-D SINGLE USE SWABS REGULAR) PADS Use as instructed to check blood sugar once daily 09/06/19   Gutierrez, Javier, MD  atorvastatin (LIPITOR) 40 MG tablet Take 1 tablet (40 mg total) by mouth daily. 04/07/20   Gutierrez, Javier, MD  Blood Glucose Monitoring Suppl (ACCU-CHEK GUIDE) w/Device KIT 1   each by Does not apply route as directed. Use as instructed to check blood sugar once daily 09/06/19   [name], MD  Cholecalciferol (VITAMIN D) 50 MCG (2000 UT) CAPS Take 1 capsule (2,000 Units total) by mouth daily. 02/02/19   [name], MD  donepezil (ARICEPT) 10 MG tablet Take 1 tablet (10 mg total) by mouth daily. 07/02/20   [name], MD  fesoterodine ([name]) 8 MG TB24 tablet TAKE 1 TABLET (8 MG TOTAL) BY MOUTH DAILY. 10/21/20   [name], MD  glucose blood (ACCU-CHEK GUIDE) test strip Use as  instructed to check blood sugar once daily 09/06/19   [name], MD  levETIRAcetam (KEPPRA) 500 MG tablet TAKE 1 TABLET(500 MG) BY MOUTH TWICE DAILY 10/29/20   [name], MD  levothyroxine (SYNTHROID) 25 MCG tablet Take 1 tablet (25 mcg total) by mouth daily before breakfast. 06/08/20   [name], MD  loperamide (IMODIUM A-D) 2 MG capsule Take 1 capsule (2 mg total) by mouth as needed for diarrhea or loose stools. 12/05/19   [name], MD  loratadine (CLARITIN) 10 MG tablet Take 1 tablet (10 mg total) by mouth daily. 05/31/19   [name], MD  metFORMIN (GLUCOPHAGE) 500 MG tablet TAKE 1 TABLET EVERY DAY WITH BREAKFAST 04/07/20   [name], MD  mirabegron ER ([name]) 50 MG TB24 tablet Take 1 tablet (50 mg total) by mouth at bedtime. 10/29/20   [name], MD  ondansetron (ZOFRAN) 4 MG tablet Take 1 tablet (4 mg total) by mouth every 8 (eight) hours as needed for nausea or vomiting. 10/21/20   [name], MD  oxybutynin (DITROPAN-XL) 5 MG 24 hr tablet Take 1 tablet (5 mg total) by mouth at bedtime. 10/29/20   [name], MD  propranolol (INDERAL) 10 MG tablet Take 1 tablet (10 mg total) by mouth 2 (two) times daily. 08/05/20   [name], MD  sertraline (ZOLOFT) 100 MG tablet Take 1 tablet (100 mg total) by mouth daily. 10/29/20   [name], MD    Allergies    Patient has no known allergies.  Review of Systems   Review of Systems  All other systems reviewed and are negative.   Physical Exam Updated Vital Signs BP (!) 134/56   Pulse 93   Temp 98.8 F (37.1 C) (Oral)   Resp (!) 28   Ht 4' 11" (1.499 m)   Wt 65.1 kg   SpO2 92%   BMI 28.99 kg/m   Physical Exam Vitals and nursing note reviewed.  Constitutional:      Appearance: Normal appearance.  HENT:     Head: Normocephalic and atraumatic.     Right Ear: External ear normal.     Left Ear: External ear normal.     Nose: Nose normal.     Mouth/Throat:      Mouth: Mucous membranes are dry.  Eyes:     Extraocular Movements: Extraocular movements intact.     Conjunctiva/sclera: Conjunctivae normal.     Pupils: Pupils are equal, round, and reactive to light.  Cardiovascular:     Rate and Rhythm: Normal rate and regular rhythm.     Pulses: Normal pulses.     Heart sounds: Normal heart sounds.  Pulmonary:     Effort: Pulmonary effort is normal.     Breath sounds: Normal breath sounds.  Abdominal:     General: Abdomen is flat. Bowel sounds are normal.     Palpations: Abdomen is soft.  Musculoskeletal:  General: Normal range of motion.     Cervical back: Normal range of motion and neck supple.  Skin:    General: Skin is warm.     Capillary Refill: Capillary refill takes less than 2 seconds.  Neurological:     General: No focal deficit present.     Mental Status: She is alert and oriented to person, place, and time.  Psychiatric:        Mood and Affect: Mood normal.        Behavior: Behavior normal.        Thought Content: Thought content normal.        Judgment: Judgment normal.     ED Results / Procedures / Treatments   Labs (all labs ordered are listed, but only abnormal results are displayed) Labs Reviewed  CBC WITH DIFFERENTIAL/PLATELET - Abnormal; Notable for the following components:      Result Value   Lymphs Abs 0.4 (*)    All other components within normal limits  COMPREHENSIVE METABOLIC PANEL - Abnormal; Notable for the following components:   Sodium 134 (*)    Glucose, Bld 123 (*)    GFR, Estimated 59 (*)    All other components within normal limits  URINE CULTURE  URINALYSIS, ROUTINE W REFLEX MICROSCOPIC  CBG MONITORING, ED    EKG None  Radiology DG Chest Portable 1 View  Result Date: 11/15/2020 CLINICAL DATA:  Altered mental status. EXAM: PORTABLE CHEST 1 VIEW COMPARISON:  August 15, 2016 FINDINGS: The heart size and mediastinal contours are within normal limits. Chronic interstitial changes. No focal  consolidation. No visible pleural effusion or pneumothorax. Degenerative changes spine and bilateral shoulders. IMPRESSION: No acute cardiopulmonary disease. Electronically Signed   By: Jeffrey  Waltz MD   On: 11/15/2020 17:55    Procedures Procedures   Medications Ordered in ED Medications - No data to display  ED Course  I have reviewed the triage vital signs and the nursing notes.  Pertinent labs & imaging results that were available during my care of the patient were reviewed by me and considered in my medical decision making (see chart for details).    MDM Rules/Calculators/A&P                          Pt has been awake and alert while here.  She has no complaints.  She is oriented.  Work up negative.  Pt is stable for d/c.  Return if worse. Final Clinical Impression(s) / ED Diagnoses Final diagnoses:  Altered mental status, unspecified altered mental status type    Rx / DC Orders ED Discharge Orders    None       , , MD 11/15/20 2246  

## 2020-11-16 ENCOUNTER — Ambulatory Visit (INDEPENDENT_AMBULATORY_CARE_PROVIDER_SITE_OTHER): Payer: Medicare Other | Admitting: Internal Medicine

## 2020-11-16 ENCOUNTER — Encounter: Payer: Self-pay | Admitting: Internal Medicine

## 2020-11-16 DIAGNOSIS — G309 Alzheimer's disease, unspecified: Secondary | ICD-10-CM

## 2020-11-16 DIAGNOSIS — M545 Low back pain, unspecified: Secondary | ICD-10-CM | POA: Diagnosis not present

## 2020-11-16 DIAGNOSIS — R410 Disorientation, unspecified: Secondary | ICD-10-CM

## 2020-11-16 DIAGNOSIS — F0280 Dementia in other diseases classified elsewhere without behavioral disturbance: Secondary | ICD-10-CM

## 2020-11-16 DIAGNOSIS — N3281 Overactive bladder: Secondary | ICD-10-CM

## 2020-11-16 NOTE — Assessment & Plan Note (Signed)
I doubt the tylenol would cause this (though could cause sedation) Will stop toviaz and oxybutynin due to high anticholinergic burden

## 2020-11-16 NOTE — Assessment & Plan Note (Signed)
Will stop all except the mybetriq

## 2020-11-16 NOTE — Assessment & Plan Note (Signed)
From a fall 10 days ago Hard to tell---but no clear spine damage (took 3 days before it was an issue) Discussed lidocaine or diclofenac (or heat rub) topically and limited tylenol

## 2020-11-16 NOTE — Patient Instructions (Signed)
Please stop the toviaz and oxybutynin. You can try over the counter diclofenac gel or lidocaine gel/patch on the painful area of her back--in addition to the heat rub.  Tylenol is okay with limit at 3000mg  per day

## 2020-11-16 NOTE — Progress Notes (Signed)
Subjective:    Patient ID: Leah Payne, female    DOB: September 11, 1945, 75 y.o.   MRN: [id number]  HPI Here for ER follow up With husband This visit occurred during the SARS-CoV-2 public health emergency.  Safety protocols were in place, including screening questions prior to the visit, additional usage of staff PPE, and extensive cleaning of exam room while observing appropriate contact time as indicated for disinfecting solutions.   Fell down 10 days ago--sitting at piano and must have gotten up without the walker Husband tried to catch her but he fell also. She fell right onto buttocks Called EMS to help get her up The noted back pain 3 days later Husband used muscle rub and tylenol --not helping  Yesterday, "she was acting delirious" per husband Wondered if it was due to the tylenol (total of 6 extra strength tylenol in under 12 hours) Couldn't get up out of chair, confused, sleepy Felt cold and was shaking EMS came and brought her to ER Reviewed those records---CXR, labs, urinalysis all benign Still having trouble getting up and down Still more sleepy  Husband also tried heat rub Seemed to help  Current Outpatient Medications on File Prior to Visit  Medication Sig Dispense Refill  . Accu-Chek FastClix Lancets MISC Use as instructed to check blood sugar once daily 100 each 1  . Alcohol Swabs (B-D SINGLE USE SWABS REGULAR) PADS Use as instructed to check blood sugar once daily 100 each 1  . atorvastatin (LIPITOR) 40 MG tablet Take 1 tablet (40 mg total) by mouth daily. 90 tablet 3  . Blood Glucose Monitoring Suppl (ACCU-CHEK GUIDE) w/Device KIT 1 each by Does not apply route as directed. Use as instructed to check blood sugar once daily 1 kit 0  . Cholecalciferol (VITAMIN D) 50 MCG (2000 UT) CAPS Take 1 capsule (2,000 Units total) by mouth daily. 30 capsule   . donepezil (ARICEPT) 10 MG tablet Take 1 tablet (10 mg total) by mouth daily. 90 tablet 2  . fesoterodine ([name]) 8  MG TB24 tablet TAKE 1 TABLET (8 MG TOTAL) BY MOUTH DAILY. 90 tablet 1  . glucose blood (ACCU-CHEK GUIDE) test strip Use as instructed to check blood sugar once daily 100 each 1  . levETIRAcetam (KEPPRA) 500 MG tablet TAKE 1 TABLET(500 MG) BY MOUTH TWICE DAILY 180 tablet 1  . levothyroxine (SYNTHROID) 25 MCG tablet Take 1 tablet (25 mcg total) by mouth daily before breakfast. 90 tablet 2  . loperamide (IMODIUM A-D) 2 MG capsule Take 1 capsule (2 mg total) by mouth as needed for diarrhea or loose stools. 30 capsule 3  . loratadine (CLARITIN) 10 MG tablet Take 1 tablet (10 mg total) by mouth daily.    . metFORMIN (GLUCOPHAGE) 500 MG tablet TAKE 1 TABLET EVERY DAY WITH BREAKFAST 90 tablet 3  . mirabegron ER ([name]) 50 MG TB24 tablet Take 1 tablet (50 mg total) by mouth at bedtime. 90 tablet 1  . ondansetron (ZOFRAN) 4 MG tablet Take 1 tablet (4 mg total) by mouth every 8 (eight) hours as needed for nausea or vomiting. 20 tablet 0  . oxybutynin (DITROPAN-XL) 5 MG 24 hr tablet Take 1 tablet (5 mg total) by mouth at bedtime. 90 tablet 1  . propranolol (INDERAL) 10 MG tablet Take 1 tablet (10 mg total) by mouth 2 (two) times daily. 180 tablet 2  . sertraline (ZOLOFT) 100 MG tablet Take 1 tablet (100 mg total) by mouth daily. 90 tablet 2  No current facility-administered medications on file prior to visit.    No Known Allergies  Past Medical History:  Diagnosis Date  . Anomaly of cranium    frontal bossing, enlarged cranium, adult strabismus  . Diabetes mellitus type 2, controlled ([facility]) 08/04/2013  . Heart disease   . History of chicken pox   . History of fainting   . 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 Hyperlipidemia 08/04/2013  . Hyponatremia 08/29/2014  . Preaxial polydactyly of right hand congenital  . Seizure  disorder ([place]) 08/04/2013  . Tachycardia   . WPW (Wolff-Parkinson-White syndrome)    has seen Dr Wynonia Lawman    Past Surgical History:  Procedure Laterality Date  . CHOLECYSTECTOMY  1978    Family History  Problem Relation Age of Onset  . Stroke Mother 74  . Pneumonia Mother   . CAD Father 62  . Alzheimer's disease Father   . Pneumonia Father   . Cancer Maternal Aunt        lung (non-smoker)  . Diabetes Neg Hx     Social History   Socioeconomic History  . Marital status: Married    Spouse name: Not on file  . Number of children: Not on file  . Years of education: Not on file  . Highest education level: High school graduate  Occupational History  . Not on file  Tobacco Use  . Smoking status: Never Smoker  . Smokeless tobacco: Never Used  Vaping Use  . Vaping Use: Never used  Substance and Sexual Activity  . Alcohol use: No    Alcohol/week: 0.0 standard drinks  . Drug use: No  . Sexual activity: Not on file  Other Topics Concern  . Not on file  Social History Narrative   Lives with husband, 2 dogs   Moved from [facility] 2009   Occupation: retired, was housewife   Edu: completed HS    Activity: no regular exercise. Dizziness precludes activity   Diet: good water, fruits/vegetables daily   Caffeine: 2 cups daily   Social Determinants of Health   Financial Resource Strain: Low Risk   . Difficulty of Paying Living Expenses: Not very hard  Food Insecurity: Not on file  Transportation Needs: Not on file  Physical Activity: Not on file  Stress: Not on file  Social Connections: Not on file  Intimate Partner Violence: Not on file   Review of Systems Does wear incontinence briefs--mostly for night (despite the 3 medications) No N/V Has been eating okay Sleeps well---very hard last night    Objective:   Physical Exam Musculoskeletal:     Comments: No spine or back tenderness SLR negative   Neurological:     Mental Status: She is alert.     Comments: Able to stand  out of wheelchair with great difficulty            Assessment & Plan:

## 2020-11-16 NOTE — Assessment & Plan Note (Signed)
No apparent striking cognitive change per husband (just somnolence, etc)

## 2020-11-17 LAB — URINE CULTURE

## 2020-11-24 ENCOUNTER — Ambulatory Visit: Payer: Medicare Other | Admitting: Family Medicine

## 2020-11-27 ENCOUNTER — Telehealth: Payer: Self-pay

## 2020-11-27 DIAGNOSIS — Z9181 History of falling: Secondary | ICD-10-CM

## 2020-11-27 DIAGNOSIS — E1142 Type 2 diabetes mellitus with diabetic polyneuropathy: Secondary | ICD-10-CM

## 2020-11-27 DIAGNOSIS — I10 Essential (primary) hypertension: Secondary | ICD-10-CM

## 2020-11-27 DIAGNOSIS — R5381 Other malaise: Secondary | ICD-10-CM

## 2020-11-27 DIAGNOSIS — F0280 Dementia in other diseases classified elsewhere without behavioral disturbance: Secondary | ICD-10-CM

## 2020-11-27 NOTE — Telephone Encounter (Signed)
Patient's husband has contacted our office in the past week requesting additional in home care supports for he and his wife.  I have spoken with George Ina our Case Manager and will be placing a referral order for chronic care management services to help patient and her husband in the home.

## 2020-11-30 ENCOUNTER — Telehealth: Payer: Self-pay | Admitting: *Deleted

## 2020-11-30 NOTE — Chronic Care Management (AMB) (Signed)
  Chronic Care Management   Note  11/30/2020 Name: IMONI KOHEN MRN: [id number] DOB: 05/09/1946  [name] is a 75 y.o. year old female who is a primary care patient of [name], MD. I reached out to [name] by phone today in response to a referral sent by Ms. Sharol Roussel PCP, [name], MD.     Ms. Chojnowski was given information about Chronic Care Management services today including:  1. CCM service includes personalized support from designated clinical staff supervised by her physician, including individualized plan of care and coordination with other care providers 2. 24/7 contact phone numbers for assistance for urgent and routine care needs. 3. Service will only be billed when office clinical staff spend 20 minutes or more in a month to coordinate care. 4. Only one practitioner may furnish and bill the service in a calendar month. 5. The patient may stop CCM services at any time (effective at the end of the month) by phone call to the office staff. 6. The patient will be responsible for cost sharing (co-pay) of up to 20% of the service fee (after annual deductible is met).  Confirmed by spouse [name] DPR on file verbally agreed to assistance and services provided by embedded care coordination/care management team today.  Follow up plan: Telephone appointment with care management team member scheduled for:12/08/2020  [facility]

## 2020-12-08 ENCOUNTER — Telehealth: Payer: Medicare Other

## 2020-12-10 ENCOUNTER — Other Ambulatory Visit: Payer: Self-pay

## 2020-12-10 NOTE — Telephone Encounter (Signed)
Pharmacy requests refill on: Ondansetron 4 mg   LAST REFILL: 10/21/2020 (Q-20, R-0) LAST OV: 10/21/2020 NEXT OV: 04/27/2021 PHARMACY: [facility]   Patient stated that she has been feeling nauseated this morning and wanted a refill on medication.

## 2020-12-11 ENCOUNTER — Ambulatory Visit (INDEPENDENT_AMBULATORY_CARE_PROVIDER_SITE_OTHER): Payer: Medicare Other

## 2020-12-11 DIAGNOSIS — G309 Alzheimer's disease, unspecified: Secondary | ICD-10-CM | POA: Diagnosis not present

## 2020-12-11 DIAGNOSIS — F0280 Dementia in other diseases classified elsewhere without behavioral disturbance: Secondary | ICD-10-CM | POA: Diagnosis not present

## 2020-12-11 DIAGNOSIS — Z9181 History of falling: Secondary | ICD-10-CM

## 2020-12-11 MED ORDER — ONDANSETRON HCL 4 MG PO TABS: 4.0000 mg | ORAL_TABLET | Freq: Three times a day (TID) | ORAL | 0 refills | Status: DC | PRN

## 2020-12-11 NOTE — Chronic Care Management (AMB) (Signed)
Chronic Care Management   CCM RN Visit Note  12/14/2020  Late entry for 12/11/20 Name: Leah Payne MRN: [id number] DOB: 1945/09/15  Subjective: Leah Payne is a 75 y.o. year old female who is a primary care patient of [name], MD. The care management team was consulted for assistance with disease management and care coordination needs.    Engaged with patient by telephone for initial visit in response to provider referral for case management and/or care coordination services.   Consent to Services:  The patient was given the following information about Chronic Care Management services today, agreed to services, and gave verbal consent: 1. CCM service includes personalized support from designated clinical staff supervised by the primary care provider, including individualized plan of care and coordination with other care providers 2. 24/7 contact phone numbers for assistance for urgent and routine care needs. 3. Service will only be billed when office clinical staff spend 20 minutes or more in a month to coordinate care. 4. Only one practitioner may furnish and bill the service in a calendar month. 5.The patient may stop CCM services at any time (effective at the end of the month) by phone call to the office staff. 6. The patient will be responsible for cost sharing (co-pay) of up to 20% of the service fee (after annual deductible is met). Patient agreed to services and consent obtained.  Patient agreed to services and verbal consent obtained.   Assessment: Review of patient past medical history, allergies, medications, health status, including review of consultants reports, laboratory and other test data, was performed as part of comprehensive evaluation and provision of chronic care management services.   SDOH (Social Determinants of Health) assessments and interventions performed:  SDOH Interventions   Flowsheet Row Most Recent Value  SDOH Interventions   Food  Insecurity Interventions Intervention Not Indicated  Financial Strain Interventions Intervention Not Indicated  Housing Interventions Intervention Not Indicated  Transportation Interventions Intervention Not Indicated       CCM Care Plan  No Known Allergies  Outpatient Encounter Medications as of 12/11/2020  Medication Sig  . atorvastatin (LIPITOR) 40 MG tablet Take 1 tablet (40 mg total) by mouth daily.  . Cholecalciferol (VITAMIN D) 50 MCG (2000 UT) CAPS Take 1 capsule (2,000 Units total) by mouth daily.  Marland Kitchen donepezil (ARICEPT) 10 MG tablet Take 1 tablet (10 mg total) by mouth daily.  Marland Kitchen levETIRAcetam (KEPPRA) 500 MG tablet TAKE 1 TABLET(500 MG) BY MOUTH TWICE DAILY  . levothyroxine (SYNTHROID) 25 MCG tablet Take 1 tablet (25 mcg total) by mouth daily before breakfast.  . loperamide (IMODIUM A-D) 2 MG capsule Take 1 capsule (2 mg total) by mouth as needed for diarrhea or loose stools.  Marland Kitchen loratadine (CLARITIN) 10 MG tablet Take 1 tablet (10 mg total) by mouth daily.  . metFORMIN (GLUCOPHAGE) 500 MG tablet TAKE 1 TABLET EVERY DAY WITH BREAKFAST  . mirabegron ER ([name]) 50 MG TB24 tablet Take 1 tablet (50 mg total) by mouth at bedtime.  . ondansetron (ZOFRAN) 4 MG tablet Take 1 tablet (4 mg total) by mouth every 8 (eight) hours as needed for nausea or vomiting.  . propranolol (INDERAL) 10 MG tablet Take 1 tablet (10 mg total) by mouth 2 (two) times daily.  . sertraline (ZOLOFT) 100 MG tablet Take 1 tablet (100 mg total) by mouth daily.  . Accu-Chek FastClix Lancets MISC Use as instructed to check blood sugar once daily  . Alcohol Swabs (B-D SINGLE USE SWABS REGULAR)  PADS Use as instructed to check blood sugar once daily  . Blood Glucose Monitoring Suppl (ACCU-CHEK GUIDE) w/Device KIT 1 each by Does not apply route as directed. Use as instructed to check blood sugar once daily  . glucose blood (ACCU-CHEK GUIDE) test strip Use as instructed to check blood sugar once daily   No  facility-administered encounter medications on file as of 12/11/2020.    Patient Active Problem List   Diagnosis Date Noted  . Low back pain 11/16/2020  . Delirium 11/16/2020  . Family circumstance 02/26/2020  . Asymmetrical hearing loss of right ear 02/25/2020  . Rhinorrhea 02/25/2020  . 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[place]) 09/19/2019  . Chronic diarrhea 05/06/2019  . 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 Candidal intertrigo 02/16/2018  . History of fall 08/01/2017  . Abdominal distension 05/16/2017  . Vitamin D deficiency 03/21/2017  . Medicare annual wellness visit, subsequent 09/23/2016  . Health maintenance examination 09/23/2016  . Advanced care planning/counseling discussion 09/23/2016  . Dizziness 03/23/2016  . Alzheimer disease ([place]) 12/17/2015  . Imbalance 12/17/2015  . WPW (Wolff-Parkinson-White syndrome)   . 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 Hypothyroidism 09/17/2015  . Debility   . Overactive bladder 08/29/2014  . Seizure disorder ([place]) 08/04/2013  . 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[place]) 08/04/2013  . Hypertension 08/04/2013  . Hyperlipidemia associated with type 2 diabetes mellitus ([place]) 08/04/2013    Conditions to be addressed/monitored:Dementia and Falls  Care Plan : Dementia (Adult)  Updates made by Dannielle Karvonen, RN since 12/14/2020 12:00 AM  Problem: Caregiver Stress   Priority: High  Long-Range Goal: Caregiver Coping Optimized   Start Date: 12/11/2020  Expected End Date: 03/24/2021  This Visit's Progress: On track  Priority: High  Current Barriers: Spouse states he has been sole caregiver to patient for the past 10 years. He states he has noticed patients dementia symptoms worsening over the past year. Spouse states patient has sustained several falls over the past 3 months because she forgets to hold on to her walker when standing or ambulating.   Caregiver Role strain  Caregiver with chronic health problems   Patient not able  to independently care for herself Clinical Goal(s):  Marland Kitchen Collaboration with [name], MD regarding development and update of comprehensive plan of care as evidenced by provider attestation and co-signature . Inter-disciplinary care team collaboration (see longitudinal plan of care)  Caregiver will work with care management team to address care coordination and chronic disease management needs related to Disease Management, educational Needs, care coordination, caregiver stress support, dementia and caregiver support.  Interventions:   Evaluation of current treatment plan related to Dementia, Lacks knowledge of community resources, patient health management and adherence to plan as established by provider.  Collaboration with [name], MD regarding development and update of comprehensive plan of care as evidenced by provider attestation       and co-signature  Inter-disciplinary care team collaboration (see longitudinal plan of care)  Assessed caregivers ongoing ability to care for patient due to own chronic health conditions.   Assessed caregivers knowledge and ability to provide patient care, including bathing, skin care, safety, nutrition, medications, and ambulation.   Allowed caregiver time to discuss problems, concerns, and feelings.   Discussed plans with cargiver for ongoing care management follow up and provided caregiver with direct contact information for care management team Self Care Activities:  . Self administers medications as prescribed . Attends all scheduled provider appointments . Calls provider office for new concerns  or questions Patient/ Caregiver Goals: -Identify available family/ friends who can assist with caregiving - use suggested community resources as appropriate - Set aside time for self if/ when able.  Identify those things that bring peace and relaxation - Review educational  information on patients condition and management strategies  provided in Mychart.  - Work with Education officer, museum on caregiver resources Follow Up Plan: The patient has been provided with contact information for the care management team and has been advised to call with any health related questions or concerns.  The care management team will reach out to the patient again over the next 30 days.    Problem: High risk for falls   Priority: High  Long-Range Goal: Patient and/ or caregiver will implement strategies to increase safety and prevent falls in the home.   Start Date: 12/11/2020  Expected End Date: 03/24/2021  This Visit's Progress: On track  Priority: High  Current Barriers: Spouse, Arwyn Besaw caregiver for patient due to dementia.  Spouse states patient has had several falls within the past 3 months without injury. Spouse states patient forgets to hold on to her walker when standing or walking at times. Patient states it is becoming more difficult to care for patient due to him having his own chronic medical conditions.  . Knowledge Deficits related to fall precautions in patient with dementia  . Unable to self administer medications as prescribed . Unable to perform IADLs independently . Cognitive Deficits Clinical Goal(s):  . patient will demonstrate improved adherence to prescribed treatment plan for decreasing falls as evidenced by patient utilizing walker when ambulating  . Caregiver will verbalize using fall risk reduction strategies as discussed . patient will minimize falls . patient will attend scheduled medical appointments . Caregiver will verbalize basic understanding of dementia disease process and  patient health management plan as evidenced by implementing fall risk precautions, assisting patient with attending provider follow up visit, taking medications as prescribed, and Long term care planning.  . The caregiver will work with the care management team toward's patients health management plan.  Interventions:  . Collaboration with  [name], MD regarding development and update of comprehensive plan of care as evidenced by provider attestation and co-signature . Inter-disciplinary care team collaboration (see longitudinal plan of care):  Collaborated with social worker regarding resource and long term care planning for patient. Social worker is scheduled to speak with patients spouse/ caregiver. . Provided written and verbal education re: Potential causes of falls and Fall prevention strategies . Reviewed medications and discussed potential side effects of medications such as dizziness and frequent urination . Assessed for falls since last encounter. . Assessed caregiver's knowledge of fall risk prevention secondary to previously provided education. . Provided patient information for fall alert systems . Reviewed scheduled/upcoming provider appointments including:  Patient Goals:  - Utilize walker appropriately with all ambulation - De-clutter walkways - Change positions slowly - Wear secure fitting shoes at all times with ambulation - Utilize home lighting for dim lit areas - Demonstrate self - Keep items used often in easy to reach places - Review education article regarding fall prevention. Follow Up Plan: The patient has been provided with contact information for the care management team and has been advised to call with any health related questions or concerns.  The care management team will reach out to the patient again over the next 30 days.       Plan:The patient has been provided with contact information for the care  management team and has been advised to call with any health related questions or concerns.  and The care management team will reach out to the patient again over the next 30 days. Quinn Plowman RN,BSN,CCM RN Case Manager [facility]  [phone]

## 2020-12-14 NOTE — Patient Instructions (Addendum)
Visit Information:  Thank you for taking the time to speak with me.  Leah Payne GOALS:  Goals Addressed            This Visit's Progress   . Caregiver Coping Optimized   On track    Timeframe:  Long-Range Goal Priority:  High Start Date:  12/11/2020                           Expected End Date:  03/24/2021  Follow up: 01/08/2021  Leah Payne / caregiver goals:  -Identify available family/ friends who can assist with caregiving - use suggested community resources as appropriate - Set aside time for self if/ when able.  Identify those things that bring peace and relaxation - Review educational  information on patients condition and management strategies provided in Mychart.  - Work with Education officer, museum on caregiver resources        . Fall prevention-Dementia   On track    Timeframe:  Long-Range Goal Priority:  High Start Date:  12/11/2020                           Expected End Date: 03/24/2021                      Follow Up Date 01/08/2021    - Utilize walker appropriately with all ambulation - De-clutter walkways - Change positions slowly - Wear secure fitting shoes at all times with ambulation - Utilize home lighting for dim lit areas - Demonstrate self - Keep items used often in easy to reach places - Review education article regarding fall prevention.   Why is this important?    There may be trouble with balance and getting around. Falls can happen.           Dementia Dementia is a condition that affects the way the brain works. It often affects memory and thinking. There are many types of dementia. Some types get worse with time and cannot be reversed. Some types of dementia include:  Alzheimer's disease. This is the most common type.  Vascular dementia. This type may happen due to a stroke.  Lewy body dementia. This type may happen to people who have Parkinson's disease.  Frontotemporal dementia. This type is caused by damage to nerve cells in certain parts of the  brain. Some people may have more than one type. What are the causes? This condition is caused by damage to cells in the brain. Some causes that cannot be reversed include:  Having a condition that affects the blood vessels of the brain, such as diabetes, heart disease, or blood vessel disease.  Changes to genes. Some causes that can be reversed or slowed include:  Injury to the brain.  Certain medicines.  Infection.  Not having enough vitamin B12 in the body, or thyroid problems.  A tumor, blood clot, or too much fluid in the brain.  Certain diseases that cause your body's defense system (immune system) to attack healthy parts of the body. What are the signs or symptoms?  Problems remembering events or people.  Having trouble taking a bath or putting clothes on.  Forgetting appointments.  Forgetting to pay bills.  Trouble planning and making meals.  Having trouble speaking.  Getting lost easily.  Changes in behavior or mood. How is this treated? Treatment depends on the cause of the dementia. It might include:  Taking medicines  for symptoms or to help control or slow down the dementia.  Treating the cause of your dementia. Your doctor can help you find support groups and other doctors who can help with your care. Follow these instructions at home: Medicines  Take over-the-counter and prescription medicines only as told by your doctor.  Use a pill organizer to help you manage your medicines.  Avoidtaking medicines for pain or for sleep. Lifestyle  Make healthy choices: ? Be active as told by your doctor. ? Do not smoke or use any products that contain nicotine or tobacco. If you need help quitting, ask your doctor. ? Do not drink alcohol. ? When you get stressed, do something that will help you relax. Your doctor can give you tips. ? Spend time with other people.  Make sure you get good sleep. To get good sleep: ? Try not to take naps during the  day. ? Keep your bedroom dark and cool. ? In the few hours before you go to bed, try not to do any exercise. ? Do not have foods and drinks with caffeine at night. Eating and drinking  Drink enough fluid to keep your pee (urine) pale yellow.  Eat a healthy diet. General instructions  Talk with your doctor to figure out: ? What you need help with. ? What your safety needs are.  Ask your doctor if it is safe for you to drive.  If told, wear a bracelet that tracks where you are or shows that you are a person with memory loss.  Work with your family to make big decisions.  Keep all follow-up visits.   Where to find more information  Alzheimer's Association: CapitalMile.co.nz  National Institute on Aging: DVDEnthusiasts.nl  World Health Organization: RoleLink.com.br Contact a doctor if:  You have any new symptoms.  Your symptoms get worse.  You have problems with swallowing or choking. Get help right away if:  You feel very sad, or feel that you want to harm yourself.  Your family members are worried for your safety. Get help right away if you feel like you may hurt yourself or others, or have thoughts about taking your own life. Go to your nearest emergency room or:  Call your local emergency services (911 in the [place]  Call the [facility] at 248-660-1196. This is open 24 hours a day.  Text the Crisis Text Line at 778-147-3909. Summary  Dementia often affects memory and thinking.  Some types of dementia get worse with time and cannot be reversed.  Treatment for this condition depends on the cause.  Talk with your doctor to figure out what you need help with.  Your doctor can help you find support groups and other doctors who can help with your care. This information is not intended to replace advice given to you by your health care provider. Make sure you discuss any questions you have with your health care provider. Document Revised:  11/25/2019 Document Reviewed: 11/25/2019 Elsevier Leah Payne Education  2021 [place] Prevention in the Home, Adult Falls can cause injuries and can happen to people of all ages. There are many things you can do to make your home safe and to help prevent falls. Ask for help when making these changes. What actions can I take to prevent falls? General Instructions  Use good lighting in all rooms. Replace any light bulbs that burn out.  Turn on the lights in dark areas. Use night-lights.  Keep items that you use  often in easy-to-reach places. Lower the shelves around your home if needed.  Set up your furniture so you have a clear path. Avoid moving your furniture around.  Do not have throw rugs or other things on the floor that can make you trip.  Avoid walking on wet floors.  If any of your floors are uneven, fix them.  Add color or contrast paint or tape to clearly mark and help you see: ? Grab bars or handrails. ? First and last steps of staircases. ? Where the edge of each step is.  If you use a stepladder: ? Make sure that it is fully opened. Do not climb a closed stepladder. ? Make sure the sides of the stepladder are locked in place. ? Ask someone to hold the stepladder while you use it.  Know where your pets are when moving through your home. What can I do in the bathroom?  Keep the floor dry. Clean up any water on the floor right away.  Remove soap buildup in the tub or shower.  Use nonskid mats or decals on the floor of the tub or shower.  Attach bath mats securely with double-sided, nonslip rug tape.  If you need to sit down in the shower, use a plastic, nonslip stool.  Install grab bars by the toilet and in the tub and shower. Do not use towel bars as grab bars.      What can I do in the bedroom?  Make sure that you have a light by your bed that is easy to reach.  Do not use any sheets or blankets for your bed that hang to the floor.  Have a  firm chair with side arms that you can use for support when you get dressed. What can I do in the kitchen?  Clean up any spills right away.  If you need to reach something above you, use a step stool with a grab bar.  Keep electrical cords out of the way.  Do not use floor polish or wax that makes floors slippery. What can I do with my stairs?  Do not leave any items on the stairs.  Make sure that you have a light switch at the top and the bottom of the stairs.  Make sure that there are handrails on both sides of the stairs. Fix handrails that are broken or loose.  Install nonslip stair treads on all your stairs.  Avoid having throw rugs at the top or bottom of the stairs.  Choose a carpet that does not hide the edge of the steps on the stairs.  Check carpeting to make sure that it is firmly attached to the stairs. Fix carpet that is loose or worn. What can I do on the outside of my home?  Use bright outdoor lighting.  Fix the edges of walkways and driveways and fix any cracks.  Remove anything that might make you trip as you walk through a door, such as a raised step or threshold.  Trim any bushes or trees on paths to your home.  Check to see if handrails are loose or broken and that both sides of all steps have handrails.  Install guardrails along the edges of any raised decks and porches.  Clear paths of anything that can make you trip, such as tools or rocks.  Have leaves, snow, or ice cleared regularly.  Use sand or salt on paths during winter.  Clean up any spills in your garage right away. This  includes grease or oil spills. What other actions can I take?  Wear shoes that: ? Have a low heel. Do not wear high heels. ? Have rubber bottoms. ? Feel good on your feet and fit well. ? Are closed at the toe. Do not wear open-toe sandals.  Use tools that help you move around if needed. These include: ? Canes. ? Walkers. ? Scooters. ? Crutches.  Review your  medicines with your doctor. Some medicines can make you feel dizzy. This can increase your chance of falling. Ask your doctor what else you can do to help prevent falls. Where to find more information  Centers for Disease Control and Prevention, STEADI: http://www.wolf.info/  National Institute on Aging: http://kim-miller.com/ Contact a doctor if:  You are afraid of falling at home.  You feel weak, drowsy, or dizzy at home.  You fall at home. Summary  There are many simple things that you can do to make your home safe and to help prevent falls.  Ways to make your home safe include removing things that can make you trip and installing grab bars in the bathroom.  Ask for help when making these changes in your home. This information is not intended to replace advice given to you by your health care provider. Make sure you discuss any questions you have with your health care provider. Document Revised: 02/12/2020 Document Reviewed: 02/12/2020 Elsevier Leah Payne Education  [place].   Consent to CCM Services: Leah Payne was given information about Chronic Care Management services today including:  1. CCM service includes personalized support from designated clinical staff supervised by her physician, including individualized plan of care and coordination with other care providers 2. 24/7 contact phone numbers for assistance for urgent and routine care needs. 3. Service will only be billed when office clinical staff spend 20 minutes or more in a month to coordinate care. 4. Only one practitioner may furnish and bill the service in a calendar month. 5. The Leah Payne may stop CCM services at any time (effective at the end of the month) by phone call to the office staff. 6. The Leah Payne will be responsible for cost sharing (co-pay) of up to 20% of the service fee (after annual deductible is met).  Leah Payne agreed to services and verbal consent obtained.   The Leah Payne verbalized understanding of  instructions, educational materials, and care plan provided today and agreed to receive a mailed copy of Leah Payne instructions, educational materials, and care plan.   The Leah Payne has been provided with contact information for the care management team and has been advised to call with any health related questions or concerns.  The care management team will reach out to the Leah Payne again over the next 30 days.   Quinn Plowman RN,BSN,CCM RN Case Manager [facility]  [phone]   CLINICAL CARE PLAN: Leah Payne Care Plan: Dementia (Adult)  Problem Identified: Caregiver Stress   Priority: High  Long-Range Goal: Caregiver Coping Optimized   Start Date: 12/11/2020  Expected End Date: 03/24/2021  This Visit's Progress: On track  Priority: High  Current Barriers: Spouse states he has been sole caregiver to Leah Payne for the past 10 years. He states he has noticed patients dementia symptoms worsening over the past year. Spouse states Leah Payne has sustained several falls over the past 3 months because she forgets to hold on to her walker when standing or ambulating.   Caregiver Role strain  Caregiver with chronic health problems   Leah Payne not able to independently care  for herself Clinical Goal(s):  Marland Kitchen Collaboration with [name], MD regarding development and update of comprehensive plan of care as evidenced by provider attestation and co-signature . Inter-disciplinary care team collaboration (see longitudinal plan of care)  Caregiver will work with care management team to address care coordination and chronic disease management needs related to Disease Management, educational Needs, care coordination, caregiver stress support, dementia and caregiver support.  Interventions:   Evaluation of current treatment plan related to Dementia, Lacks knowledge of community resources, Leah Payne health management and adherence to plan as established by provider.  Collaboration with [name], MD regarding development and update of comprehensive plan of care as evidenced by provider attestation       and co-signature  Inter-disciplinary care team collaboration (see longitudinal plan of care)  Assessed caregivers ongoing ability to care for Leah Payne due to own chronic health conditions.   Assessed caregivers knowledge and ability to provide Leah Payne care, including bathing, skin care, safety, nutrition, medications, and ambulation.   Allowed caregiver time to discuss problems, concerns, and feelings.   Discussed plans with cargiver for ongoing care management follow up and provided caregiver with direct contact information for care management team Self Care Activities:  . Self administers medications as prescribed . Attends all scheduled provider appointments . Calls provider office for new concerns or questions Leah Payne/ Caregiver Goals: -Identify available family/ friends who can assist with caregiving - use suggested community resources as appropriate - Set aside time for self if/ when able.  Identify those things that bring peace and relaxation - Review educational  information on patients condition and management strategies provided in Mychart.  - Work with Education officer, museum on caregiver resources Follow Up Plan: The Leah Payne has been provided with contact information for the care management team and has been advised to call with any health related questions or concerns.  The care management team will reach out to the Leah Payne again over the next 30 days.     Problem Identified: High risk for falls   Priority: High  Long-Range Goal: Leah Payne and/ or caregiver will implement strategies to increase safety and prevent falls in the home.   Start Date: 12/11/2020  Expected End Date: 03/24/2021  This Visit's Progress: On track  Priority: High  Current Barriers: Spouse, Leah Payne caregiver for Leah Payne due to dementia.  Spouse states Leah Payne has had several falls within the  past 3 months without injury. Spouse states Leah Payne forgets to hold on to her walker when standing or walking at times. Leah Payne states it is becoming more difficult to care for Leah Payne due to him having his own chronic medical conditions.  . Knowledge Deficits related to fall precautions in Leah Payne with dementia  . Unable to self administer medications as prescribed . Unable to perform IADLs independently . Cognitive Deficits Clinical Goal(s):  . Leah Payne will demonstrate improved adherence to prescribed treatment plan for decreasing falls as evidenced by Leah Payne utilizing walker when ambulating  . Caregiver will verbalize using fall risk reduction strategies as discussed . Leah Payne will minimize falls . Leah Payne will attend scheduled medical appointments . Caregiver will verbalize basic understanding of dementia disease process and  Leah Payne health management plan as evidenced by implementing fall risk precautions, assisting Leah Payne with attending provider follow up visit, taking medications as prescribed, and Long term care planning.  . The caregiver will work with the care management team toward's patients health management plan.  Interventions:  . Collaboration with [name], MD regarding development and update of  comprehensive plan of care as evidenced by provider attestation and co-signature . Inter-disciplinary care team collaboration (see longitudinal plan of care):  Collaborated with social worker regarding resource and long term care planning for Leah Payne. Social worker is scheduled to speak with patients spouse/ caregiver. . Provided written and verbal education re: Potential causes of falls and Fall prevention strategies . Reviewed medications and discussed potential side effects of medications such as dizziness and frequent urination . Assessed for falls since last encounter. . Assessed caregiver's knowledge of fall risk prevention secondary to previously provided  education. . Provided Leah Payne information for fall alert systems . Reviewed scheduled/upcoming provider appointments including:  Leah Payne Goals:  - Utilize walker appropriately with all ambulation - De-clutter walkways - Change positions slowly - Wear secure fitting shoes at all times with ambulation - Utilize home lighting for dim lit areas - Demonstrate self - Keep items used often in easy to reach places - Review education article regarding fall prevention. Follow Up Plan: The Leah Payne has been provided with contact information for the care management team and has been advised to call with any health related questions or concerns.  The care management team will reach out to the Leah Payne again over the next 30 days.

## 2021-01-08 ENCOUNTER — Telehealth: Payer: Medicare Other

## 2021-01-08 ENCOUNTER — Ambulatory Visit (INDEPENDENT_AMBULATORY_CARE_PROVIDER_SITE_OTHER): Payer: Medicare Other

## 2021-01-08 DIAGNOSIS — G309 Alzheimer's disease, unspecified: Secondary | ICD-10-CM | POA: Diagnosis not present

## 2021-01-08 DIAGNOSIS — Z9181 History of falling: Secondary | ICD-10-CM

## 2021-01-08 DIAGNOSIS — F0280 Dementia in other diseases classified elsewhere without behavioral disturbance: Secondary | ICD-10-CM | POA: Diagnosis not present

## 2021-01-08 DIAGNOSIS — I10 Essential (primary) hypertension: Secondary | ICD-10-CM | POA: Diagnosis not present

## 2021-01-08 NOTE — Patient Instructions (Signed)
Visit Information:  Thank you for taking the time to speak with me.  PATIENT GOALS:  Goals Addressed             This Visit's Progress    Caregiver Coping Optimized   On track    Timeframe:  Long-Range Goal Priority:  High Start Date:  12/11/2020                           Expected End Date:  03/24/2021  Follow up: 01/22/2021  Patient / caregiver goals:  - Continue to try and identify available family/ friends who can assist with caregiving - use suggested community resources as appropriate - Set aside time for self if/ when able.  Identify those things that bring peace and relaxation - Work with Child psychotherapist on caregiver resources          Fall prevention-Dementia   Not on track    Timeframe:  Long-Range Goal Priority:  High Start Date:  12/11/2020                           Expected End Date: 03/24/2021                      Follow Up Date 01/22/2021    - Continue with reminders to patient regarding the use of her walker.   - De-clutter walkways - Change positions slowly - Wear secure fitting shoes at all times with ambulation - Utilize home lighting for dim lit areas - Keep items used often in easy to reach places   Why is this important?   There may be trouble with balance and getting around. Falls can happen.             Patient verbalizes understanding of instructions provided today and agrees to view in MyChart.   The patient has been provided with contact information for the care management team and has been advised to call with any health related questions or concerns.  A HIPAA compliant phone message was left for the patient providing contact information and requesting a return call.   George Ina RN,BSN,CCM RN Case Manager [facility] [place]  [phone]

## 2021-01-08 NOTE — Chronic Care Management (AMB) (Signed)
Chronic Care Management   CCM RN Visit Note  01/08/2021 Name: Leah Payne MRN: [id number] DOB: 01/15/1946  Subjective: Leah Payne is a 75 y.o. year old female who is a primary care patient of [name], MD. The care management team was consulted for assistance with disease management and care coordination needs.    Engaged with patient by telephone for follow up visit in response to provider referral for case management and/or care coordination services.   Consent to Services:  The patient was given information about Chronic Care Management services, agreed to services, and gave verbal consent prior to initiation of services.  Please see initial visit note for detailed documentation.   Patient agreed to services and verbal consent obtained.   Assessment: Review of patient past medical history, allergies, medications, health status, including review of consultants reports, laboratory and other test data, was performed as part of comprehensive evaluation and provision of chronic care management services.   SDOH (Social Determinants of Health) assessments and interventions performed:    CCM Care Plan  No Known Allergies  Outpatient Encounter Medications as of 01/08/2021  Medication Sig   atorvastatin (LIPITOR) 40 MG tablet Take 1 tablet (40 mg total) by mouth daily.   Cholecalciferol (VITAMIN D) 50 MCG (2000 UT) CAPS Take 1 capsule (2,000 Units total) by mouth daily.   donepezil (ARICEPT) 10 MG tablet Take 1 tablet (10 mg total) by mouth daily.   levothyroxine (SYNTHROID) 25 MCG tablet Take 1 tablet (25 mcg total) by mouth daily before breakfast.   loperamide (IMODIUM A-D) 2 MG capsule Take 1 capsule (2 mg total) by mouth as needed for diarrhea or loose stools.   loratadine (CLARITIN) 10 MG tablet Take 1 tablet (10 mg total) by mouth daily.   metFORMIN (GLUCOPHAGE) 500 MG tablet TAKE 1 TABLET EVERY DAY WITH BREAKFAST   mirabegron ER ([name]) 50 MG TB24 tablet Take  1 tablet (50 mg total) by mouth at bedtime.   ondansetron (ZOFRAN) 4 MG tablet Take 1 tablet (4 mg total) by mouth every 8 (eight) hours as needed for nausea or vomiting.   propranolol (INDERAL) 10 MG tablet Take 1 tablet (10 mg total) by mouth 2 (two) times daily.   sertraline (ZOLOFT) 100 MG tablet Take 1 tablet (100 mg total) by mouth daily.   Accu-Chek FastClix Lancets MISC Use as instructed to check blood sugar once daily   Alcohol Swabs (B-D SINGLE USE SWABS REGULAR) PADS Use as instructed to check blood sugar once daily   Blood Glucose Monitoring Suppl (ACCU-CHEK GUIDE) w/Device KIT 1 each by Does not apply route as directed. Use as instructed to check blood sugar once daily   glucose blood (ACCU-CHEK GUIDE) test strip Use as instructed to check blood sugar once daily   levETIRAcetam (KEPPRA) 500 MG tablet TAKE 1 TABLET(500 MG) BY MOUTH TWICE DAILY   No facility-administered encounter medications on file as of 01/08/2021.    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   Candidal intertrigo 02/16/2018   History of fall 08/01/2017   Abdominal distension 05/16/2017   Vitamin D deficiency 03/21/2017   Medicare annual wellness visit, subsequent 09/23/2016   Health maintenance examination 09/23/2016   Advanced care planning/counseling  discussion 09/23/2016   Dizziness 03/23/2016   Alzheimer disease ([place]) 12/17/2015   Imbalance 12/17/2015   WPW (Wolff-Parkinson-White syndrome)    Type 2 diabetes, controlled, 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[place]) 09/17/2015   Hypothyroidism 09/17/2015   Debility    Overactive bladder 08/29/2014   Seizure disorder ([place]) 08/04/2013   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 He states otherwise patient is doing fine. He states she continues to take her medications as prescribed.   Caregiver Role strain Caregiver with chronic health problems  Patient not able to independently care for herself Clinical Goal(s):  Collaboration with [name], MD regarding development and update of comprehensive plan of care as evidenced by provider attestation and co-signature Inter-disciplinary care team collaboration (see longitudinal plan of care) Caregiver will work with care management team to address care coordination and chronic disease management needs related to Disease Management, educational Needs, care coordination, caregiver stress support, dementia and caregiver support.  Interventions:  Evaluation of current treatment plan related to Dementia, Lacks knowledge of community resources, patient health management and adherence to plan as established by provider. Collaboration with [name], MD regarding development and update of comprehensive plan of care as evidenced by provider attestation       and co-signature Inter-disciplinary care team collaboration (see longitudinal plan of care):  Referred patient to social worker. Assessed caregivers ongoing ability to care for patient due to own chronic health conditions.  Assessed caregivers knowledge and ability to provide patient care, including bathing, skin  care, safety, nutrition, medications, and ambulation.  Allowed caregiver time to discuss problems, concerns, and feelings.  Discussed plans with cargiver for ongoing care management follow up and provided caregiver with direct contact information for care management team Patient/ Caregiver Goals: - Continue to try and identify available family/ friends who can assist with caregiving - use suggested community resources as appropriate - Set aside time for self if/ when able.  Identify those things that bring peace and relaxation - Work with Education officer, museum on caregiver resources Follow Up Plan: The patient has been provided with contact information for the care management team and has been advised to call with any health related questions or concerns.  The care management team will reach out to the patient again over the next 30 days.     Problem: High risk for falls   Priority: High   Long-Range Goal: Patient and/ or caregiver will implement strategies to increase safety and prevent falls in the home.   Start Date: 12/11/2020  Expected End Date: 03/24/2021  This Visit's Progress: On track  Recent Progress: On track  Priority: High  Current Barriers: Telephone call to patient's spouse, Imagene Boss.  Spouse states patient is doing fine other than a recent fall two evenings ago.  He states patient was in the bedroom, got up and started walking and forgot to use her walker and fell.  He states his neighbor assisted him in getting the patient  up.  Spouse denies patient having any injuries. Spouse states he needs help with caring for patient.  He states due to his own medical conditions he is concerned as well when he is hospitalized there is no one to care for her.  Spouse states it is becoming more difficult for him to care for her.  Knowledge Deficits related to fall precautions in patient with dementia  Unable to self administer medications as prescribed Unable to perform IADLs  independently Cognitive Deficits Clinical Goal(s):  patient will demonstrate improved adherence to prescribed treatment plan for decreasing falls as evidenced by patient utilizing walker when ambulating  Caregiver will verbalize using fall risk reduction strategies as discussed patient will minimize falls patient will attend scheduled medical appointments Caregiver will verbalize basic understanding of dementia disease process and  patient health management plan as evidenced by implementing fall risk precautions, assisting patient with attending provider follow up visit, taking medications as prescribed, and Long term care planning.  The caregiver will work with the care management team toward's patients health management plan.  Interventions:  Collaboration with [name], MD regarding development and update of comprehensive plan of care as evidenced by provider attestation and co-signature Inter-disciplinary care team collaboration (see longitudinal plan of care) Fall prevention re-discussed with spouse Reviewed medications and discussed potential side effects of medications such as dizziness and frequent urination Assessed for falls since last encounter. Assessed caregiver's knowledge of fall risk prevention secondary to previously provided education. Provided patient information for fall alert systems Referral made to social worker to assist with community resource needs.  Reviewed scheduled/upcoming provider appointments including:  Patient is scheduled for an annual wellness exam with primary care provider on 04/23/2021.  Patient Goals:  - Continue with reminders to patient regarding the use of her walker.   - De-clutter walkways - Change positions slowly - Wear secure fitting shoes at all times with ambulation - Utilize home lighting for dim lit areas - Keep items used often in easy to reach places Follow Up Plan: The patient has been provided with contact information for the  care management team and has been advised to call with any health related questions or concerns.  The care management team will reach out to the patient again over the next 30 days.      Plan:The patient has been provided with contact information for the care management team and has been advised to call with any health related questions or concerns.  and The care management team will reach out to the patient again over the next 30 days. Quinn Plowman RN,BSN,CCM RN Case Manager [place]  [phone]

## 2021-01-12 ENCOUNTER — Ambulatory Visit: Payer: Medicare Other | Admitting: *Deleted

## 2021-01-12 DIAGNOSIS — I10 Essential (primary) hypertension: Secondary | ICD-10-CM

## 2021-01-12 DIAGNOSIS — F0280 Dementia in other diseases classified elsewhere without behavioral disturbance: Secondary | ICD-10-CM

## 2021-01-12 DIAGNOSIS — G309 Alzheimer's disease, unspecified: Secondary | ICD-10-CM

## 2021-01-12 DIAGNOSIS — Z9181 History of falling: Secondary | ICD-10-CM

## 2021-01-12 NOTE — Patient Instructions (Signed)
Visit Information  PATIENT GOALS:  Goals Addressed             This Visit's Progress    To gain knowledge of resources and services available to aide in safety and care       Timeframe:  Long-Range Goal Priority:  High Start Date:  01/12/21                           Expected End Date:    03/24/21                   Follow Up Date 01/26/2021    -review resource/material sent with community programs and support options - begin a notebook of services in my neighborhood or community - call 211 when I need some help - follow-up on any referrals for help I am given - think ahead to make sure my need does not become an emergency - make a note about what I need to have by the phone or take with me, like an identification card or social security number have a back-up plan - have a back-up plan - make a list of family or friends that I can call    Why is this important?   Knowing how and where to find help for yourself or family in your neighborhood and community is an important skill.  You will want to take some steps to learn how.    Notes:          The patient verbalized understanding of instructions, educational materials, and care plan provided today and declined offer to receive copy of patient instructions, educational materials, and care plan.   Telephone follow up appointment with care management team member scheduled for:01/26/2021 Reece Levy MSW, LCSW Licensed Clinical Social Worker [facility] [phone]

## 2021-01-12 NOTE — Chronic Care Management (AMB) (Signed)
Chronic Care Management    Clinical Social Work Note  01/12/2021 Name: Leah Payne MRN: [id number] DOB: 1946/01/07  Leah Payne is a 75 y.o. year old female who is a primary care patient of [name], MD. The CCM team was consulted to assist the patient with chronic disease management and/or care coordination needs related to: [employer] , Level of Care Concerns, and Caregiver Stress.   Engaged with patient's spouse, [name], by telephone for initial visit in response to provider referral for social work chronic care management and care coordination services.   Consent to Services:  The patient was given information about Chronic Care Management services, agreed to services, and gave verbal consent prior to initiation of services.  Please see initial visit note for detailed documentation.   Patient agreed to services and consent obtained.   Assessment: Review of patient past medical history, allergies, medications, and health status, including review of relevant consultants reports was performed today as part of a comprehensive evaluation and provision of chronic care management and care coordination services.     SDOH (Social Determinants of Health) assessments and interventions performed:    Advanced Directives Status: Not addressed in this encounter.  CCM Care Plan  No Known Allergies  Outpatient Encounter Medications as of 01/12/2021  Medication Sig   Accu-Chek FastClix Lancets MISC Use as instructed to check blood sugar once daily   Alcohol Swabs (B-D SINGLE USE SWABS REGULAR) PADS Use as instructed to check blood sugar once daily   atorvastatin (LIPITOR) 40 MG tablet Take 1 tablet (40 mg total) by mouth daily.   Blood Glucose Monitoring Suppl (ACCU-CHEK GUIDE) w/Device KIT 1 each by Does not apply route as directed. Use as instructed to check blood sugar once daily   Cholecalciferol (VITAMIN D) 50 MCG (2000 UT) CAPS Take 1 capsule (2,000 Units  total) by mouth daily.   donepezil (ARICEPT) 10 MG tablet Take 1 tablet (10 mg total) by mouth daily.   glucose blood (ACCU-CHEK GUIDE) test strip Use as instructed to check blood sugar once daily   levETIRAcetam (KEPPRA) 500 MG tablet TAKE 1 TABLET(500 MG) BY MOUTH TWICE DAILY   levothyroxine (SYNTHROID) 25 MCG tablet Take 1 tablet (25 mcg total) by mouth daily before breakfast.   loperamide (IMODIUM A-D) 2 MG capsule Take 1 capsule (2 mg total) by mouth as needed for diarrhea or loose stools.   loratadine (CLARITIN) 10 MG tablet Take 1 tablet (10 mg total) by mouth daily.   metFORMIN (GLUCOPHAGE) 500 MG tablet TAKE 1 TABLET EVERY DAY WITH BREAKFAST   mirabegron ER ([name]) 50 MG TB24 tablet Take 1 tablet (50 mg total) by mouth at bedtime.   ondansetron (ZOFRAN) 4 MG tablet Take 1 tablet (4 mg total) by mouth every 8 (eight) hours as needed for nausea or vomiting.   propranolol (INDERAL) 10 MG tablet Take 1 tablet (10 mg total) by mouth 2 (two) times daily.   sertraline (ZOLOFT) 100 MG tablet Take 1 tablet (100 mg total) by mouth daily.   No facility-administered encounter medications on file as of 01/12/2021.    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   Candidal intertrigo 02/16/2018   History of fall 08/01/2017  Abdominal distension 05/16/2017   Vitamin D deficiency 03/21/2017   Medicare annual wellness visit, subsequent 09/23/2016   Health maintenance examination 09/23/2016   Advanced care planning/counseling discussion 09/23/2016   Dizziness 03/23/2016   Alzheimer disease ([place]) 12/17/2015   Imbalance 12/17/2015   WPW (Wolff-Parkinson-White syndrome)    Type 2 diabetes, controlled, with peripheral neuropathy  ([place]) 09/17/2015   Hypothyroidism 09/17/2015   Debility    Overactive bladder 08/29/2014   Seizure disorder ([place]) 08/04/2013   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/2013   Hypertension 08/04/2013   Hyperlipidemia associated with type 2 diabetes mellitus ([place]) 08/04/2013    Conditions to be addressed/monitored: Dementia; Limited social support, Level of care concerns, Cognitive Deficits, Memory Deficits, and Lacks knowledge of community resources, caregiver stress/support  Care Plan : LCSW Plan of Care  Updates made by [name], LCSW since 01/12/2021 12:00 AM     Problem: Functional Decline and lack of caregiver knowledge for long term care planning/support   Priority: High  Note:   Current barriers:    Limited social support, Level of care concerns, Cognitive Deficits, Memory Deficits, Lacks knowledge of community resources, caregiver stress, and long term care planning/needs Clinical Goals: Patient will work with CSW to address needs related to Long term care planning and needs to provide safety and optimal care/QOL for patient with cognitive impairment and caregiving needs Clinical Interventions:  CSW spoke with pt's husband by phone. He is primary caregiver for his wife who has Alzheimer's dementia.  Per husband, pt has full Medicaid as well as [facility] Medicare plan. Pt's husband does not want to pursue placement of pt at this time, but is in need of community resources, support to help in the safety and care of pt.  CSW discussed Medicaid programs and other community based support/programs and will consult Care Guide to follow up with husband in regards to these and other as identified.   Collaboration with [name], MD regarding development and update of comprehensive plan of care as evidenced by provider attestation and co-signature Inter-disciplinary care team collaboration (see longitudinal plan of care) Assessment of needs, barriers , agencies contacted, as well as  how impacting  Review various resources, discussed options and provided patient information about Referral to care guide for community support and other options  , PACE Program, Private pay options home health needs, Services provided by [employer] , Dementia resources and support , and Discussed several options for long term counseling based on need and insurance Patient interviewed and appropriate assessments performed Referred patient to community resources care guide team for assistance with community resources/support, long term care planning, level of care/facility options  Provided patient with information about options to consider with long term planning; in-home and facility options Discussed plans with patient for ongoing care management follow up and provided patient with direct contact information for care management team Assisted patient/caregiver with obtaining information about health plan benefits Provided education to patient/caregiver regarding level of care options. Clinical interventions provided:Solution-Focused Strategies, Active listening / Reflection utilized , Emotional Supportive Provided, Problem Solving /Task Center , Motivational Interviewing, Brief CBT , Caregiver stress acknowledged , Participation in support group encouraged , Consideration of in-home help encouraged , and community resources to be provided for review and consideration Patient Goals/Self-Care Activities: Over the next 30 days  review resource/material sent with community programs and support options begin a notebook of services in my neighborhood or community call 211 when I need some help follow-up on any referrals for help I  am given think ahead to make sure my need does not become an emergency make a note about what I need to have by the phone or take with me, like an identification card or social security number have a back-up plan have a back-up plan make a list of family or friends that I  can call          Follow Up Plan: [place] will reach out to patient for assistance with community programs and support; including Medicaid PCS, PACE, Adult Day Care, memory care programs. and Appointment scheduled for SW follow up with client by phone on: 01/26/2021      Eduard Clos MSW, [place] Licensed Clinical Social Worker [facility] [place]

## 2021-01-13 ENCOUNTER — Telehealth: Payer: Medicare Other

## 2021-01-13 ENCOUNTER — Telehealth: Payer: Self-pay | Admitting: *Deleted

## 2021-01-13 NOTE — Telephone Encounter (Signed)
   Telephone encounter was:  Successful.  01/13/2021 Name: MIHO MONDA MRN: [id number] DOB: 09-Sep-1945  [name] is a 75 y.o. year old female who is a primary care patient of Eustaquio Boyden, MD . The community resource team was consulted for assistance with Caregiver Stress  Enclosed: Pace, Adult Day Care ,  [facility] Senior [facility] , Medicaid PCS, [facility] cares ( program ) , Adult Care Homes, [facility]  Care guide performed the following interventions: Patient provided with information about care guide support team and interviewed to confirm resource needs.  Follow Up Plan:   No further follow up needed at this time , let patient know if they needed additional information to please call     Alois Cliche -[facility] Guide , Embedded Care Coordination [facility], Care Management  [phone] [address] , [place] [address] Email : [email or web address]

## 2021-01-19 ENCOUNTER — Ambulatory Visit: Payer: Medicare Other

## 2021-01-19 DIAGNOSIS — Z9181 History of falling: Secondary | ICD-10-CM

## 2021-01-19 DIAGNOSIS — I10 Essential (primary) hypertension: Secondary | ICD-10-CM | POA: Diagnosis not present

## 2021-01-19 DIAGNOSIS — G309 Alzheimer's disease, unspecified: Secondary | ICD-10-CM | POA: Diagnosis not present

## 2021-01-19 DIAGNOSIS — F0280 Dementia in other diseases classified elsewhere without behavioral disturbance: Secondary | ICD-10-CM

## 2021-01-19 NOTE — Patient Instructions (Signed)
Visit Information:  Thank you for taking the time to speak with me today  PATIENT GOALS:  Goals Addressed             This Visit's Progress    Caregiver Coping Optimized   On track    Timeframe:  Long-Range Goal Priority:  High Start Date:  12/11/2020                           Expected End Date:  03/24/2021  Follow up: 02/26/2021  Patient / caregiver goals:  - Continue to try and identify available family/ friends who can assist with caregiving - use suggested community resources as appropriate - Set aside time for self if/ when able.  Identify those things that bring peace and relaxation - Notify social worker or RN case manager if list of community resources not received.           Fall prevention-Dementia   On track    Timeframe:  Long-Range Goal Priority:  High Start Date:  12/11/2020                           Expected End Date: 03/24/2021                      Follow Up Date 02/26/2021   - Continue with reminders to patient regarding the use of her walker.   - De-clutter walkways - Change positions slowly - Wear secure fitting shoes at all times with ambulation - Utilize home lighting for dim lit areas - Keep items used often in easy to reach places - Building services engineer or RN case Production designer, theatre/television/film if you do not receive the list of community resources in the mail.    Why is this important?   There may be trouble with balance and getting around. Falls can happen.             The patient verbalized understanding of instructions, educational materials, and care plan provided today and agreed to receive a mailed copy of patient instructions, educational materials, and care plan.   The patient has been provided with contact information for the care management team and has been advised to call with any health related questions or concerns.  The care management team will reach out to the patient again over the next 45 days.   George Ina RN,BSN,CCM RN Case Manager [place] [place]  [place]  [phone]

## 2021-01-19 NOTE — Chronic Care Management (AMB) (Signed)
Chronic Care Management   CCM RN Visit Note  01/19/2021 Name: Leah Payne MRN: [id number] DOB: [birth year]  Subjective: Leah Payne is a 75 y.o. year old female who is a primary care patient of [name], MD. The care management team was consulted for assistance with disease management and care coordination needs.    Engaged with patient by telephone for follow up visit in response to provider referral for case management and/or care coordination services.   Consent to Services:  The patient was given information about Chronic Care Management services, agreed to services, and gave verbal consent prior to initiation of services.  Please see initial visit note for detailed documentation.   Patient agreed to services and verbal consent obtained.   Assessment: Review of patient past medical history, allergies, medications, health status, including review of consultants reports, laboratory and other test data, was performed as part of comprehensive evaluation and provision of chronic care management services.   SDOH (Social Determinants of Health) assessments and interventions performed:    CCM Care Plan  No Known Allergies  Outpatient Encounter Medications as of 01/19/2021  Medication Sig   Accu-Chek FastClix Lancets MISC Use as instructed to check blood sugar once daily   Alcohol Swabs (B-D SINGLE USE SWABS REGULAR) PADS Use as instructed to check blood sugar once daily   atorvastatin (LIPITOR) 40 MG tablet Take 1 tablet (40 mg total) by mouth daily.   Blood Glucose Monitoring Suppl (ACCU-CHEK GUIDE) w/Device KIT 1 each by Does not apply route as directed. Use as instructed to check blood sugar once daily   Cholecalciferol (VITAMIN D) 50 MCG (2000 UT) CAPS Take 1 capsule (2,000 Units total) by mouth daily.   donepezil (ARICEPT) 10 MG tablet Take 1 tablet (10 mg total) by mouth daily.   glucose blood (ACCU-CHEK GUIDE) test strip Use as instructed to check blood sugar  once daily   levETIRAcetam (KEPPRA) 500 MG tablet TAKE 1 TABLET(500 MG) BY MOUTH TWICE DAILY   levothyroxine (SYNTHROID) 25 MCG tablet Take 1 tablet (25 mcg total) by mouth daily before breakfast.   loperamide (IMODIUM A-D) 2 MG capsule Take 1 capsule (2 mg total) by mouth as needed for diarrhea or loose stools.   loratadine (CLARITIN) 10 MG tablet Take 1 tablet (10 mg total) by mouth daily.   metFORMIN (GLUCOPHAGE) 500 MG tablet TAKE 1 TABLET EVERY DAY WITH BREAKFAST   mirabegron ER ([name]) 50 MG TB24 tablet Take 1 tablet (50 mg total) by mouth at bedtime.   ondansetron (ZOFRAN) 4 MG tablet Take 1 tablet (4 mg total) by mouth every 8 (eight) hours as needed for nausea or vomiting.   propranolol (INDERAL) 10 MG tablet Take 1 tablet (10 mg total) by mouth 2 (two) times daily.   sertraline (ZOLOFT) 100 MG tablet Take 1 tablet (100 mg total) by mouth daily.   No facility-administered encounter medications on file as of 01/19/2021.    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   Candidal intertrigo 02/16/2018   History of fall 08/01/2017   Abdominal distension 05/16/2017   Vitamin D deficiency 03/21/2017   Medicare annual wellness visit, subsequent 09/23/2016   Health maintenance examination 09/23/2016   Advanced care planning/counseling  discussion 09/23/2016   Dizziness 03/23/2016   Alzheimer disease ([place]) 12/17/2015   Imbalance 12/17/2015   WPW (Wolff-Parkinson-White syndrome)    Type 2 diabetes, controlled, 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[place]) 09/17/2015   Hypothyroidism 09/17/2015   Debility    Overactive bladder 08/29/2014   Seizure disorder ([place]) 08/04/2013   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    Hypertension 08/04/2013   Hyperlipidemia associated with type 2 diabetes mellitus ([place]) 08/04/2013    Conditions to be addressed/monitored:Dementia and Falls  Care Plan : Dementia (Adult)  Updates made by Dannielle Karvonen, RN since 01/19/2021 12:00 AM   Problem: Caregiver Stress   Priority: High   Long-Range Goal: Caregiver Coping Optimized   Start Date: 12/11/2020  Expected End Date: 03/24/2021  This Visit's Progress: On track  Recent Progress: On track  Priority: High  Current Barriers: Spouse confirms he spoke with social worker regarding community resources to assist patient with care. He states he is awaiting information to be sent to him regarding this.    Caregiver Role strain Caregiver with chronic health problems  Patient not able to independently care for herself Clinical Goal(s):  Collaboration with [name], MD regarding development and update of comprehensive plan of care as evidenced by provider attestation and co-signature Inter-disciplinary care team collaboration (see longitudinal plan of care) Caregiver will work with care management team to address care coordination and chronic disease management needs related to Disease Management, educational Needs, care coordination, caregiver stress support, dementia and caregiver support.  Interventions:  Evaluation of current treatment plan related to Dementia, Lacks knowledge of community resources, patient health management and adherence to plan as established by provider. Collaboration with [name], MD regarding development and update of comprehensive plan of care as evidenced by provider attestation       and co-signature Inter-disciplinary care team collaboration (see longitudinal plan of care):  Spouse has been contacted by social worker to discuss community resources.  Assessed caregivers ongoing ability to care for patient due to own chronic health conditions.  Assessed caregivers knowledge and ability  to provide patient care, including bathing, skin care, safety, nutrition, medications, and ambulation.  Allowed caregiver time to discuss problems, concerns, and feelings.  Discussed plans with cargiver for ongoing care management follow up and provided caregiver with direct contact information for care management team Patient/ Caregiver Goals: - Continue to try and identify available family/ friends who can assist with caregiving - use suggested community resources as appropriate - Set aside time for self if/ when able.  Identify those things that bring peace and relaxation - Notify social worker or RN case manager if list of community resources not received.  Follow Up Plan: The patient has been provided with contact information for the care management team and has been advised to call with any health related questions or concerns.  The care management team will reach out to the patient again over the next 30 days.     Problem: High risk for falls   Priority: High   Long-Range Goal: Patient and/ or caregiver will implement strategies to increase safety and prevent falls in the home.   Start Date: 12/11/2020  Expected End Date: 03/24/2021  This Visit's Progress: On track  Recent Progress: On track  Priority: High  Current Barriers: Telephone call to patient's spouse, Leah Payne.  HIPAA verified for patient.  Spouse confirms he spoke with social worker regarding community / care resources for patient. He states he is waiting for a list of resources to  be sent in the mail.  Spouse states patient is doing fine.  He denies patient having any further falls since last outreach with RNCM.    Knowledge Deficits related to fall precautions in patient with dementia  Unable to self administer medications as prescribed Unable to perform IADLs independently Cognitive Deficits Clinical Goal(s):  patient will demonstrate improved adherence to prescribed treatment plan for decreasing falls as evidenced  by patient utilizing walker when ambulating  Caregiver will verbalize using fall risk reduction strategies as discussed patient will minimize falls patient will attend scheduled medical appointments Caregiver will verbalize basic understanding of dementia disease process and  patient health management plan as evidenced by implementing fall risk precautions, assisting patient with attending provider follow up visit, taking medications as prescribed, and Long term care planning.  The caregiver will work with the care management team toward's patients health management plan.  Interventions:  Collaboration with [name], MD regarding development and update of comprehensive plan of care as evidenced by provider attestation and co-signature Inter-disciplinary care team collaboration (see longitudinal plan of care) Fall prevention re-discussed with spouse Reviewed medications and discussed potential side effects of medications such as dizziness and frequent urination Assessed for falls since last encounter. Assessed caregiver's knowledge of fall risk prevention secondary to previously provided education. Provided patient information for fall alert systems Reviewed scheduled/upcoming provider appointments including:  Patient is scheduled for an annual wellness exam with primary care provider on 04/23/2021.  Patient Goals:  - Continue with reminders to patient regarding the use of her walker.   - De-clutter walkways - Change positions slowly - Wear secure fitting shoes at all times with ambulation - Utilize home lighting for dim lit areas - Keep items used often in easy to reach places - Counselling psychologist or RN case Freight forwarder if you do not receive the list of community resources in the mail.  Follow Up Plan: The patient has been provided with contact information for the care management team and has been advised to call with any health related questions or concerns.  The care management team  will reach out to the patient again over the next 30 days.       Plan:The patient has been provided with contact information for the care management team and has been advised to call with any health related questions or concerns.  and The care management team will reach out to the patient again over the next 45 days. Quinn Plowman RN,BSN,CCM RN Case Manager [place]  [phone]

## 2021-01-22 ENCOUNTER — Telehealth: Payer: Medicare Other

## 2021-01-22 ENCOUNTER — Other Ambulatory Visit: Payer: Self-pay | Admitting: Family Medicine

## 2021-01-26 ENCOUNTER — Telehealth: Payer: Medicare Other

## 2021-01-26 NOTE — Telephone Encounter (Signed)
Leah Payne is not on her current med list. Is she taking both this and myrbetriq for bladder control?

## 2021-01-26 NOTE — Telephone Encounter (Signed)
Spoke with pt's husband, Leah Payne (on dpr) asking about Leah Payne and [name].  He states pt has not taken [name] in a long time.  She is only taking [name] for bladder control.  FYI to Dr. [name].   Request denied.

## 2021-01-27 ENCOUNTER — Telehealth: Payer: Self-pay

## 2021-01-27 ENCOUNTER — Telehealth: Payer: Self-pay | Admitting: *Deleted

## 2021-01-27 NOTE — Chronic Care Management (AMB) (Addendum)
Chronic Care Management Pharmacy Assistant   Name: Leah Payne  MRN: [id number] DOB: 1946-06-19  Reason for Encounter: Medication Adherence and Delivery Coordination   Recent office visits:  11/16/20 Leah Payne - ED follow up - Stop oxybutynin and toviaz due to anticholinergic burden, she may try OTC diclofenac topical gel for pain  Recent consult visits:  None since last CCM contact  Hospital visits:  11/15/20 - [facility] Altered mental status - no medication changes  Medications: Outpatient Encounter Medications as of 01/27/2021  Medication Sig   Accu-Chek FastClix Lancets MISC Use as instructed to check blood sugar once daily   Alcohol Swabs (B-D SINGLE USE SWABS REGULAR) PADS Use as instructed to check blood sugar once daily   atorvastatin (LIPITOR) 40 MG tablet Take 1 tablet (40 mg total) by mouth daily.   Blood Glucose Monitoring Suppl (ACCU-CHEK GUIDE) w/Device KIT 1 each by Does not apply route as directed. Use as instructed to check blood sugar once daily   Cholecalciferol (VITAMIN D) 50 MCG (2000 UT) CAPS Take 1 capsule (2,000 Units total) by mouth daily.   donepezil (ARICEPT) 10 MG tablet Take 1 tablet (10 mg total) by mouth daily.   glucose blood (ACCU-CHEK GUIDE) test strip Use as instructed to check blood sugar once daily   levETIRAcetam (KEPPRA) 500 MG tablet TAKE 1 TABLET(500 MG) BY MOUTH TWICE DAILY   levothyroxine (SYNTHROID) 25 MCG tablet Take 1 tablet (25 mcg total) by mouth daily before breakfast.   loperamide (IMODIUM A-D) 2 MG capsule Take 1 capsule (2 mg total) by mouth as needed for diarrhea or loose stools.   loratadine (CLARITIN) 10 MG tablet Take 1 tablet (10 mg total) by mouth daily.   metFORMIN (GLUCOPHAGE) 500 MG tablet TAKE 1 TABLET EVERY DAY WITH BREAKFAST   mirabegron ER ([name]) 50 MG TB24 tablet Take 1 tablet (50 mg total) by mouth at bedtime.   ondansetron (ZOFRAN) 4 MG tablet Take 1 tablet (4 mg total) by mouth every 8 (eight) hours  as needed for nausea or vomiting.   propranolol (INDERAL) 10 MG tablet Take 1 tablet (10 mg total) by mouth 2 (two) times daily.   sertraline (ZOLOFT) 100 MG tablet Take 1 tablet (100 mg total) by mouth daily.   No facility-administered encounter medications on file as of 01/27/2021.   BP Readings from Last 3 Encounters:  11/16/20 112/72  11/15/20 (!) 134/56  10/21/20 126/82    Lab Results  Component Value Date   HGBA1C 6.0 (A) 10/21/2020      Recent OV, Consult or Hospital visit: 11/16/20 - Dr.Letvak - Stop oxybutynin and [name]   Patient obtains medications through Adherence Packaging  90 Days   Patient is due for next adherence delivery on: 02/03/2021  Spoke with patient on 01/27/2021 reviewed medications and coordinated delivery. Spoke with the husband to review medications.  This delivery to include: Adherence Packaging  90 Days  PACKS: Atorvastatin 40 mg- 1 tablet evening meal Donepezil 10 mg - 1 tablet breakfast [name]   PRN/VIAL medications: Azelastine Spray 1% due to using as needed  Patient declined  the following medications this month: [name] 6m -1 tablet evening meal (11/16/20 Dr.Letvak stopped) She was no longer on oxybutynin at time of last PCP visit. She will remain off this.  No refill request needed from PCP.  Confirmed delivery date of 02/03/2021, advised patient that pharmacy will contact her the morning of delivery.  Follow-Up:  Coordination of Enhanced Pharmacy Services and Pharmacist Review  MDebbora Dus CPP notified  VAvel Sensor CFruitvaleAssistant [phone] I have  reviewed the care management and care coordination activities outlined in this encounter and I am certifying that I agree with the content of this note. No further action required.  MDebbora Dus PharmD Clinical Pharmacist LRedondo BeachPrimary Care at [facility]Jac[phone]

## 2021-01-27 NOTE — Telephone Encounter (Signed)
  Care Management   Follow Up Note   01/27/2021 Name: Leah Payne MRN: [id number] DOB: [birth year]   Referred by: Eustaquio Boyden, MD Reason for referral : No chief complaint on file.   An unsuccessful telephone outreach was attempted today. The patient was referred to the case management team for assistance with care management and care coordination.   Follow Up Plan: The care management team will reach out to the patient again over the next 10 days.   Reece Levy MSW, LCSW Licensed Clinical Social Worker [facility] [phone]

## 2021-02-01 ENCOUNTER — Ambulatory Visit (INDEPENDENT_AMBULATORY_CARE_PROVIDER_SITE_OTHER): Payer: Medicare Other | Admitting: *Deleted

## 2021-02-01 DIAGNOSIS — G309 Alzheimer's disease, unspecified: Secondary | ICD-10-CM | POA: Diagnosis not present

## 2021-02-01 DIAGNOSIS — E1142 Type 2 diabetes mellitus with diabetic polyneuropathy: Secondary | ICD-10-CM

## 2021-02-01 DIAGNOSIS — F0280 Dementia in other diseases classified elsewhere without behavioral disturbance: Secondary | ICD-10-CM

## 2021-02-01 DIAGNOSIS — R5381 Other malaise: Secondary | ICD-10-CM

## 2021-02-01 NOTE — Patient Instructions (Signed)
Visit Information  PATIENT GOALS:  Goals Addressed             This Visit's Progress    To gain knowledge of resources and services available to aide in safety and care       Timeframe:  Long-Range Goal Priority:  High Start Date:  01/12/21                           Expected End Date:    03/24/21                   Follow Up Date 01/26/2021    -review resource/material sent with community programs and support options - begin a notebook of services in my neighborhood or community - call 211 when I need some help - follow-up on any referrals for help I am given - think ahead to make sure my need does not become an emergency - make a note about what I need to have by the phone or take with me, like an identification card or social security number have a back-up plan - have a back-up plan - make a list of family or friends that I can call   Why is this important?   Knowing how and where to find help for yourself or family in your neighborhood and community is an important skill.  You will want to take some steps to learn how.    Notes:          The patient verbalized understanding of instructions, educational materials, and care plan provided today and declined offer to receive copy of patient instructions, educational materials, and care plan.   Telephone follow up appointment with care management team member scheduled for:02/12/21   Reece Levy MSW, LCSW Licensed Clinical Social Worker [facility] [place] [phone]

## 2021-02-01 NOTE — Chronic Care Management (AMB) (Signed)
Chronic Care Management    Clinical Social Work Note  02/01/2021 Name: Leah Payne MRN: [id number] DOB: June 11, 1946  Leah Payne is a 75 y.o. year old female who is a primary care patient of Leah Bush, MD. The CCM team was consulted to assist the patient with chronic disease management and/or care coordination needs related to: [employer] , Level of Care Concerns, and Caregiver Stress.   Engaged with patient by telephone for follow up visit in response to provider referral for social work chronic care management and care coordination services.   Consent to Services:  The patient was given information about Chronic Care Management services, agreed to services, and gave verbal consent prior to initiation of services.  Please see initial visit note for detailed documentation.   Patient agreed to services and consent obtained.   Assessment: Review of patient past medical history, allergies, medications, and health status, including review of relevant consultants reports was performed today as part of a comprehensive evaluation and provision of chronic care management and care coordination services.     SDOH (Social Determinants of Health) assessments and interventions performed:    Advanced Directives Status: Not addressed in this encounter.  CCM Care Plan  No Known Allergies  Outpatient Encounter Medications as of 02/01/2021  Medication Sig   Accu-Chek FastClix Lancets MISC Use as instructed to check blood sugar once daily   Alcohol Swabs (B-D SINGLE USE SWABS REGULAR) PADS Use as instructed to check blood sugar once daily   atorvastatin (LIPITOR) 40 MG tablet Take 1 tablet (40 mg total) by mouth daily.   Blood Glucose Monitoring Suppl (ACCU-CHEK GUIDE) w/Device KIT 1 each by Does not apply route as directed. Use as instructed to check blood sugar once daily   Cholecalciferol (VITAMIN D) 50 MCG (2000 UT) CAPS Take 1 capsule (2,000 Units total) by mouth daily.    donepezil (ARICEPT) 10 MG tablet Take 1 tablet (10 mg total) by mouth daily.   glucose blood (ACCU-CHEK GUIDE) test strip Use as instructed to check blood sugar once daily   levETIRAcetam (KEPPRA) 500 MG tablet TAKE 1 TABLET(500 MG) BY MOUTH TWICE DAILY   levothyroxine (SYNTHROID) 25 MCG tablet Take 1 tablet (25 mcg total) by mouth daily before breakfast.   loperamide (IMODIUM A-D) 2 MG capsule Take 1 capsule (2 mg total) by mouth as needed for diarrhea or loose stools.   loratadine (CLARITIN) 10 MG tablet Take 1 tablet (10 mg total) by mouth daily.   metFORMIN (GLUCOPHAGE) 500 MG tablet TAKE 1 TABLET EVERY DAY WITH BREAKFAST   mirabegron ER ([name]) 50 MG TB24 tablet Take 1 tablet (50 mg total) by mouth at bedtime.   ondansetron (ZOFRAN) 4 MG tablet Take 1 tablet (4 mg total) by mouth every 8 (eight) hours as needed for nausea or vomiting.   propranolol (INDERAL) 10 MG tablet Take 1 tablet (10 mg total) by mouth 2 (two) times daily.   sertraline (ZOLOFT) 100 MG tablet Take 1 tablet (100 mg total) by mouth daily.   No facility-administered encounter medications on file as of 02/01/2021.    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   Candidal intertrigo 02/16/2018   History of fall 08/01/2017  Abdominal distension 05/16/2017   Vitamin D deficiency 03/21/2017   Medicare annual wellness visit, subsequent 09/23/2016   Health maintenance examination 09/23/2016   Advanced care planning/counseling discussion 09/23/2016   Dizziness 03/23/2016   Alzheimer disease ([place]) 12/17/2015   Imbalance 12/17/2015   WPW (Wolff-Parkinson-White syndrome)    Type 2 diabetes, controlled, 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[place]) 09/17/2015    Hypothyroidism 09/17/2015   Debility    Overactive bladder 08/29/2014   Seizure disorder ([place]) 08/04/2013   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   Hypertension 08/04/2013   Hyperlipidemia associated with type 2 diabetes mellitus ([place]) 08/04/2013    Conditions to be addressed/monitored: Dementia; Level of care concerns and Lacks knowledge of community resources  Care Plan : LCSW Plan of Care  Updates made by Leah Peer, LCSW since 02/01/2021 12:00 AM     Problem: Functional Decline and lack of caregiver knowledge for long term care planning/support   Priority: High  Note:   Current barriers:    Limited social support, Level of care concerns, Cognitive Deficits, Memory Deficits, Lacks knowledge of community resources, caregiver stress, and long term care planning/needs Clinical Goals: Patient will work with CSW to address needs related to Long term care planning and needs to provide safety and optimal care/QOL for patient with cognitive impairment and caregiving needs Clinical Interventions:  CSW spoke with pt's husband by phone. He is primary caregiver for his wife who has Alzheimer's dementia.  Per husband, pt has full Medicaid as well as [facility] Medicare plan. Pt's husband does not want to pursue placement of pt at this time, but is in need of community resources, support to help in the safety and care of pt.  CSW discussed Medicaid programs and other community based support/programs and will consult Care Guide to follow up with husband in regards to these and other as identified. Pt's husband reports he has not received material being mailed by Care Guide- will request follow up on this.   Collaboration with Leah Bush, MD regarding development and update of comprehensive plan of care as evidenced by provider attestation and co-signature Inter-disciplinary care team collaboration (see longitudinal plan of care) Assessment of needs, barriers , agencies contacted, as well as  how impacting  Review various resources, discussed options and provided patient information about Referral to care guide for community support and other options  , PACE Program, Private pay options home health needs, Services provided by [employer] , Dementia resources and support , and Discussed several options for long term counseling based on need and insurance Patient interviewed and appropriate assessments performed Referred patient to community resources care guide team for assistance with community resources/support, long term care planning, level of care/facility options  Provided patient with information about options to consider with long term planning; in-home and facility options Discussed plans with patient for ongoing care management follow up and provided patient with direct contact information for care management team Assisted patient/caregiver with obtaining information about health plan benefits Provided education to patient/caregiver regarding level of care options. Clinical interventions provided:Solution-Focused Strategies, Active listening / Reflection utilized , Emotional Supportive Provided, Problem Solving /Task Center , Motivational Interviewing, Brief CBT , Caregiver stress acknowledged , Participation in support group encouraged , Consideration of in-home help encouraged , and community resources to be provided for review and consideration Patient Goals/Self-Care Activities: Over the next 30 days review resource/material sent with community programs and support options begin a notebook of services in my neighborhood or community call 211 when I need  some help follow-up on any referrals for help I am given think ahead to make sure my need does not become an emergency make a note about what I need to have by the phone or take with me, like an identification card or social security number have a back-up plan have a back-up plan make a list of family or friends that I  can call          Follow Up Plan: Appointment scheduled for SW follow up with client by phone on:  02/12/21      Eduard Clos MSW, [place] Licensed Clinical Social Worker [place] [phone]

## 2021-02-02 ENCOUNTER — Encounter: Payer: Self-pay | Admitting: *Deleted

## 2021-02-12 ENCOUNTER — Ambulatory Visit: Payer: Medicare Other | Admitting: *Deleted

## 2021-02-12 DIAGNOSIS — G309 Alzheimer's disease, unspecified: Secondary | ICD-10-CM

## 2021-02-12 DIAGNOSIS — F0280 Dementia in other diseases classified elsewhere without behavioral disturbance: Secondary | ICD-10-CM

## 2021-02-12 NOTE — Chronic Care Management (AMB) (Signed)
Chronic Care Management    Clinical Social Work Note  02/12/2021 Name: Leah Payne MRN: [id number] DOB: Jan 15, 1946  Leah Payne is a 75 y.o. year old female who is a primary care patient of [name], MD. The CCM team was consulted to assist the patient with chronic disease management and/or care coordination needs related to: [employer]  and Caregiver Stress.   Engaged with patient by telephone for follow up visit in response to provider referral for social work chronic care management and care coordination services.   Consent to Services:  The patient was given information about Chronic Care Management services, agreed to services, and gave verbal consent prior to initiation of services.  Please see initial visit note for detailed documentation.   Patient agreed to services and consent obtained.   Assessment: Review of patient past medical history, allergies, medications, and health status, including review of relevant consultants reports was performed today as part of a comprehensive evaluation and provision of chronic care management and care coordination services.     SDOH (Social Determinants of Health) assessments and interventions performed:    Advanced Directives Status: Not addressed in this encounter.  CCM Care Plan  No Known Allergies  Outpatient Encounter Medications as of 02/12/2021  Medication Sig   Accu-Chek FastClix Lancets MISC Use as instructed to check blood sugar once daily   Alcohol Swabs (B-D SINGLE USE SWABS REGULAR) PADS Use as instructed to check blood sugar once daily   atorvastatin (LIPITOR) 40 MG tablet Take 1 tablet (40 mg total) by mouth daily.   Blood Glucose Monitoring Suppl (ACCU-CHEK GUIDE) w/Device KIT 1 each by Does not apply route as directed. Use as instructed to check blood sugar once daily   Cholecalciferol (VITAMIN D) 50 MCG (2000 UT) CAPS Take 1 capsule (2,000 Units total) by mouth daily.   donepezil (ARICEPT) 10  MG tablet Take 1 tablet (10 mg total) by mouth daily.   glucose blood (ACCU-CHEK GUIDE) test strip Use as instructed to check blood sugar once daily   levETIRAcetam (KEPPRA) 500 MG tablet TAKE 1 TABLET(500 MG) BY MOUTH TWICE DAILY   levothyroxine (SYNTHROID) 25 MCG tablet Take 1 tablet (25 mcg total) by mouth daily before breakfast.   loperamide (IMODIUM A-D) 2 MG capsule Take 1 capsule (2 mg total) by mouth as needed for diarrhea or loose stools.   loratadine (CLARITIN) 10 MG tablet Take 1 tablet (10 mg total) by mouth daily.   metFORMIN (GLUCOPHAGE) 500 MG tablet TAKE 1 TABLET EVERY DAY WITH BREAKFAST   mirabegron ER ([name]) 50 MG TB24 tablet Take 1 tablet (50 mg total) by mouth at bedtime.   ondansetron (ZOFRAN) 4 MG tablet Take 1 tablet (4 mg total) by mouth every 8 (eight) hours as needed for nausea or vomiting.   propranolol (INDERAL) 10 MG tablet Take 1 tablet (10 mg total) by mouth 2 (two) times daily.   sertraline (ZOLOFT) 100 MG tablet Take 1 tablet (100 mg total) by mouth daily.   No facility-administered encounter medications on file as of 02/12/2021.    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   Candidal intertrigo 02/16/2018   History of fall 08/01/2017   Abdominal distension 05/16/2017  Vitamin D deficiency 03/21/2017   Medicare annual wellness visit, subsequent 09/23/2016   Health maintenance examination 09/23/2016   Advanced care planning/counseling discussion 09/23/2016   Dizziness 03/23/2016   Alzheimer disease ([place]) 12/17/2015   Imbalance 12/17/2015   WPW (Wolff-Parkinson-White syndrome)    Type 2 diabetes, controlled, 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[place]) 09/17/2015   Hypothyroidism 09/17/2015    Debility    Overactive bladder 08/29/2014   Seizure disorder ([place]) 08/04/2013   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   Hypertension 08/04/2013   Hyperlipidemia associated with type 2 diabetes mellitus ([place]) 08/04/2013    Conditions to be addressed/monitored: Dementia; Level of care concerns, Cognitive Deficits, and Memory Deficits  Care Plan : LCSW Plan of Care  Updates made by [name], LCSW since 02/12/2021 12:00 AM     Problem: Functional Decline and lack of caregiver knowledge for long term care planning/support   Priority: High  Note:   Current barriers:    Limited social support, Level of care concerns, Cognitive Deficits, Memory Deficits, Lacks knowledge of community resources, caregiver stress, and long term care planning/needs Clinical Goals: Patient will work with CSW to address needs related to Long term care planning and needs to provide safety and optimal care/QOL for patient with cognitive impairment and caregiving needs Clinical Interventions:  Pt's husband reports he has received material mailed-  CSW reviewed PCS material with husband and instructed him of plans to have Care guide mail him an application to take to PCP office at pt's next office visit for completion.  CSW also discussed other info/program material and suggested he review and consider options.   CSW spoke with pt's husband by phone. He is primary caregiver for his wife who has Alzheimer's dementia.  Per husband, pt has full Medicaid as well as [facility] Medicare plan. Pt's husband does not want to pursue placement of pt at this time, but is in need of community resources, support to help in the safety and care of pt.  CSW discussed Medicaid programs and other community based support/programs and will consult Care Guide to follow up with husband in regards to these and other as identified Collaboration with [name], MD regarding development and update of comprehensive plan of care as evidenced  by provider attestation and co-signature Inter-disciplinary care team collaboration (see longitudinal plan of care) Assessment of needs, barriers , agencies contacted, as well as how impacting  Review various resources, discussed options and provided patient information about Referral to care guide for community support and other options  , PACE Program, Private pay options home health needs, Services provided by [employer] , Dementia resources and support , and Discussed several options for long term counseling based on need and insurance Patient interviewed and appropriate assessments performed Referred patient to community resources care guide team for assistance with community resources/support, long term care planning, level of care/facility options  Provided patient with information about options to consider with long term planning; in-home and facility options Discussed plans with patient for ongoing care management follow up and provided patient with direct contact information for care management team Assisted patient/caregiver with obtaining information about health plan benefits Provided education to patient/caregiver regarding level of care options. Clinical interventions provided:Solution-Focused Strategies, Active listening / Reflection utilized , Emotional Supportive Provided, Problem Solving /Task Center , Motivational Interviewing, Brief CBT , Caregiver stress acknowledged , Participation in support group encouraged , Consideration of in-home help encouraged , and community resources to be provided for review and consideration Patient Goals/Self-Care Activities:  Over the next 30 days review resource/material sent with community programs and support options begin a notebook of services in my neighborhood or community call 211 when I need some help follow-up on any referrals for help I am given think ahead to make sure my need does not become an emergency make a note about what I  need to have by the phone or take with me, like an identification card or social security number have a back-up plan have a back-up plan make a list of family or friends that I can call          Follow Up Plan: Appointment scheduled for SW follow up with client by phone on: 03/08/21      Eduard Clos MSW, [place] Licensed Clinical Social Worker [place] [phone]

## 2021-02-12 NOTE — Patient Instructions (Signed)
Visit Information  PATIENT GOALS:  Goals Addressed             This Visit's Progress    To gain knowledge of resources and services available to aide in safety and care       Timeframe:  Long-Range Goal Priority:  High Start Date:  01/12/21                           Expected End Date:    03/24/21                   Follow Up Date 03/08/2021    -review resource/material sent with community programs and support options - begin a notebook of services in my neighborhood or community - call 211 when I need some help - follow-up on any referrals for help I am given - think ahead to make sure my need does not become an emergency - make a note about what I need to have by the phone or take with me, like an identification card or social security number have a back-up plan - have a back-up plan - make a list of family or friends that I can call   Why is this important?   Knowing how and where to find help for yourself or family in your neighborhood and community is an important skill.  You will want to take some steps to learn how.    Notes:         The patient verbalized understanding of instructions, educational materials, and care plan provided today and declined offer to receive copy of patient instructions, educational materials, and care plan.   Telephone follow up appointment with care management team member scheduled for:03/08/21  Reece Levy MSW, LCSW Licensed Clinical Social Worker [facility] [place] [phone]

## 2021-02-23 ENCOUNTER — Telehealth: Payer: Self-pay

## 2021-02-23 NOTE — Chronic Care Management (AMB) (Addendum)
Chronic Care Management Pharmacy Assistant   Name: Leah Payne  MRN: [id number] DOB: 02-02-1946  Reason for Encounter: Medication Adherence and Delivery Coordination   Recent office visits:  02/12/21- Family Medicine telephone call, CCM Social worker - no medication changes 02/01/21- Family Medicine telephone call, CCM Social worker - no medication changes  Recent consult visits: None since last CCM contact  Hospital visits:  None in previous 6 months  Medications: Outpatient Encounter Medications as of 02/23/2021  Medication Sig   Accu-Chek FastClix Lancets MISC Use as instructed to check blood sugar once daily   Alcohol Swabs (B-D SINGLE USE SWABS REGULAR) PADS Use as instructed to check blood sugar once daily   atorvastatin (LIPITOR) 40 MG tablet Take 1 tablet (40 mg total) by mouth daily.   Blood Glucose Monitoring Suppl (ACCU-CHEK GUIDE) w/Device KIT 1 each by Does not apply route as directed. Use as instructed to check blood sugar once daily   Cholecalciferol (VITAMIN D) 50 MCG (2000 UT) CAPS Take 1 capsule (2,000 Units total) by mouth daily.   donepezil (ARICEPT) 10 MG tablet Take 1 tablet (10 mg total) by mouth daily.   glucose blood (ACCU-CHEK GUIDE) test strip Use as instructed to check blood sugar once daily   levETIRAcetam (KEPPRA) 500 MG tablet TAKE 1 TABLET(500 MG) BY MOUTH TWICE DAILY   levothyroxine (SYNTHROID) 25 MCG tablet Take 1 tablet (25 mcg total) by mouth daily before breakfast.   loperamide (IMODIUM A-D) 2 MG capsule Take 1 capsule (2 mg total) by mouth as needed for diarrhea or loose stools.   loratadine (CLARITIN) 10 MG tablet Take 1 tablet (10 mg total) by mouth daily.   metFORMIN (GLUCOPHAGE) 500 MG tablet TAKE 1 TABLET EVERY DAY WITH BREAKFAST   mirabegron ER ([name]) 50 MG TB24 tablet Take 1 tablet (50 mg total) by mouth at bedtime.   ondansetron (ZOFRAN) 4 MG tablet Take 1 tablet (4 mg total) by mouth every 8 (eight) hours as needed for nausea  or vomiting.   propranolol (INDERAL) 10 MG tablet Take 1 tablet (10 mg total) by mouth 2 (two) times daily.   sertraline (ZOLOFT) 100 MG tablet Take 1 tablet (100 mg total) by mouth daily.   No facility-administered encounter medications on file as of 02/23/2021.    BP Readings from Last 3 Encounters:  11/16/20 112/72  11/15/20 (!) 134/56  10/21/20 126/82    Lab Results  Component Value Date   HGBA1C 6.0 (A) 10/21/2020      Patient is due for next adherence delivery on: 03/04/2021  Spoke with patient on 02/23/21 reviewed medications and coordinated delivery. Spoke to husband [name]  This delivery to include: Adherence Packaging  30 Days  Packs: Atorvastatin 40 mg- 1 tablet evening meal Donepezil 10 mg - 1 tablet breakfast Levetiracetam 500 mg - 1 tablet breakfast 1 tablet evening meal Levothyroxine 25 mcg - 1 tablet before breakfast Metformin 500 mg- 1 tablet breakfast [name] 50 mg - 1 tablet bedtime Propranolol 10 mg - 1 tablet breakfast 1 tablet evening meal Sertraline 100 mg - 1 tablet breakfast Vitamin D3 2000 units - 1 capsule breakfast Loratadine 10mg  - 1 tablet evening meal   VIAL medications: Ondansetron 4mg  - 1 tablet every 8 hours as needed for nausea (pt request refill)  Patient declined the following medications this month: None  Any concerns about your medications? Yes the patients husband reports she is wetting the bed with just the Myrbetric 50mg  and would like to  add the [name] 8mg  and the patient continues to have nausea intermittently and is requesting to have a refill on Zofran 4mg  for prn use   How often do you forget or accidentally miss a dose? Never  Do you use a pillbox? Yes  Is patient in packaging Yes  If yes  What is the date on your next pill pack? 02/26/2021  Any concerns or issues with your packaging?No the patient reports she likes the packs   Refills requested from PCP include: Ondansetron 4mg   take 1 tablet every 8 hours as needed for  nausea  Confirmed delivery date of 03/04/2021, advised patient that pharmacy will contact them the morning of delivery.  No BG or BP readings reported.  Debbora Dus, CPP notified  Avel Sensor, [place] Assistant [phone]  I have reviewed the care management and care coordination activities outlined in this encounter and I am certifying that I agree with the content of this note. Consulted PCP regarding urinary symptoms. Do not recommend restarting any anticholinergic medications. She had side effects previously on [name] and oxybutynin. Requested Zofran refill.  Debbora Dus, PharmD Clinical Pharmacist [facility] at [facility] [phone]

## 2021-02-24 ENCOUNTER — Other Ambulatory Visit: Payer: Self-pay

## 2021-02-24 ENCOUNTER — Telehealth: Payer: Self-pay

## 2021-02-24 DIAGNOSIS — N3281 Overactive bladder: Secondary | ICD-10-CM

## 2021-02-24 NOTE — Telephone Encounter (Signed)
Zofran Last Rx:  12/11/20, #20/0 Last OV:  10/21/20, f/u Next OV:  04/27/21, AWV prt 2

## 2021-02-24 NOTE — Telephone Encounter (Signed)
-----   Message from Phil Dopp, [facility] sent at 02/24/2021  3:37 PM EDT ----- Regarding: Refill [name] requesting refill on Zofran for [facility] due to nausea. Please send to [facility] pharmacy if approved.  Thank you!  Phil Dopp, PharmD Clinical Pharmacist [facility] at [facility] [phone]

## 2021-02-24 NOTE — Telephone Encounter (Signed)
Patients husband reports Leah Payne is still wetting bed with just the Myrbetric 50mg  and would like to add the [name] 8 mg back. I believe she experienced side effects with multiple anticholinergics in the past so I do not recommend this. Do you have any other suggestions?  , PharmD Clinical Pharmacist [facility] at [facility] [phone]

## 2021-02-25 MED ORDER — ONDANSETRON HCL 4 MG PO TABS: 4.0000 mg | ORAL_TABLET | Freq: Three times a day (TID) | ORAL | 0 refills | Status: DC | PRN

## 2021-02-25 NOTE — Telephone Encounter (Signed)
Also don't recommend [name] given increased risk of falls, AMS, worsening dementia and other side effects.  Would offer urology evaluation to consider other treatment options such as posterior tibial nerve stimulation

## 2021-02-26 ENCOUNTER — Telehealth: Payer: Self-pay | Admitting: Family Medicine

## 2021-02-26 ENCOUNTER — Other Ambulatory Visit: Payer: Self-pay | Admitting: *Deleted

## 2021-02-26 ENCOUNTER — Ambulatory Visit (INDEPENDENT_AMBULATORY_CARE_PROVIDER_SITE_OTHER): Payer: Medicare Other

## 2021-02-26 DIAGNOSIS — F0280 Dementia in other diseases classified elsewhere without behavioral disturbance: Secondary | ICD-10-CM

## 2021-02-26 DIAGNOSIS — Z9181 History of falling: Secondary | ICD-10-CM

## 2021-02-26 NOTE — Chronic Care Management (AMB) (Signed)
Chronic Care Management   CCM RN Visit Note  02/26/2021 Name: Leah Payne MRN: [id number] DOB: Dec 05, 1945  Subjective: Leah Payne is a 75 y.o. year old female who is a primary care patient of Leah Bush, MD. The care management team was consulted for assistance with disease management and care coordination needs.    Engaged with patient face to face for follow up visit in response to provider referral for case management and/or care coordination services.   Consent to Services:  The patient was given information about Chronic Care Management services, agreed to services, and gave verbal consent prior to initiation of services.  Please see initial visit note for detailed documentation.   Patient agreed to services and verbal consent obtained.   Assessment: Review of patient past medical history, allergies, medications, health status, including review of consultants reports, laboratory and other test data, was performed as part of comprehensive evaluation and provision of chronic care management services.   SDOH (Social Determinants of Health) assessments and interventions performed:    CCM Care Plan  No Known Allergies  Outpatient Encounter Medications as of 02/26/2021  Medication Sig   Accu-Chek FastClix Lancets MISC Use as instructed to check blood sugar once daily   Alcohol Swabs (B-D SINGLE USE SWABS REGULAR) PADS Use as instructed to check blood sugar once daily   atorvastatin (LIPITOR) 40 MG tablet Take 1 tablet (40 mg total) by mouth daily.   Blood Glucose Monitoring Suppl (ACCU-CHEK GUIDE) w/Device KIT 1 each by Does not apply route as directed. Use as instructed to check blood sugar once daily   Cholecalciferol (VITAMIN D) 50 MCG (2000 UT) CAPS Take 1 capsule (2,000 Units total) by mouth daily.   donepezil (ARICEPT) 10 MG tablet Take 1 tablet (10 mg total) by mouth daily.   glucose blood (ACCU-CHEK GUIDE) test strip Use as instructed to check blood sugar once  daily   levETIRAcetam (KEPPRA) 500 MG tablet TAKE 1 TABLET(500 MG) BY MOUTH TWICE DAILY   levothyroxine (SYNTHROID) 25 MCG tablet Take 1 tablet (25 mcg total) by mouth daily before breakfast.   loperamide (IMODIUM A-D) 2 MG capsule Take 1 capsule (2 mg total) by mouth as needed for diarrhea or loose stools.   loratadine (CLARITIN) 10 MG tablet Take 1 tablet (10 mg total) by mouth daily.   metFORMIN (GLUCOPHAGE) 500 MG tablet TAKE 1 TABLET EVERY DAY WITH BREAKFAST   mirabegron ER ([name]) 50 MG TB24 tablet Take 1 tablet (50 mg total) by mouth at bedtime.   ondansetron (ZOFRAN) 4 MG tablet Take 1 tablet (4 mg total) by mouth every 8 (eight) hours as needed for nausea or vomiting.   propranolol (INDERAL) 10 MG tablet Take 1 tablet (10 mg total) by mouth 2 (two) times daily.   sertraline (ZOLOFT) 100 MG tablet Take 1 tablet (100 mg total) by mouth daily.   No facility-administered encounter medications on file as of 02/26/2021.    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   Candidal intertrigo 02/16/2018   History of fall 08/01/2017   Abdominal distension 05/16/2017   Vitamin D deficiency 03/21/2017   Medicare annual wellness visit, subsequent 09/23/2016   Health maintenance examination 09/23/2016   Advanced care  planning/counseling discussion 09/23/2016   Dizziness 03/23/2016   Alzheimer disease ([place]) 12/17/2015   Imbalance 12/17/2015   WPW (Wolff-Parkinson-White syndrome)    Type 2 diabetes, controlled, 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[place]) 09/17/2015   Hypothyroidism 09/17/2015   Debility    Overactive bladder 08/29/2014   Seizure disorder ([place]) 08/04/2013   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   Start Date: 12/11/2020  Expected End Date: 05/24/2021  This Visit's Progress: On track  Recent Progress: On track  Priority: High  Note:   Current Barriers: Telephone call to patient's spouse, Leah Payne.  HIPAA verified for patient.  Spouse denies patient having any falls since last outreach with RNCM.  He states she has not had any new issues.  He reports that he is continuing to work with the Education officer, museum regarding community resources for patient.  Spouse states he received the community resource information but has not received the application that needs to be completed by the doctor. RNCM to inform Education officer, museum.  Knowledge Deficits related to fall precautions in patient with dementia  Unable to self administer medications as prescribed Unable to perform IADLs independently Cognitive Deficits Clinical Goal(s):  patient will demonstrate improved adherence to prescribed treatment plan for decreasing falls as evidenced by patient utilizing walker when ambulating  Caregiver will verbalize using fall risk reduction strategies as discussed patient will minimize falls patient will attend scheduled medical appointments Caregiver will verbalize basic understanding of dementia disease process and  patient health management plan as evidenced by implementing fall risk precautions, assisting patient with attending provider follow up visit, taking medications as prescribed, and Long term care planning.  The caregiver will work with the care management team toward's patients health  management plan.  Interventions:  Collaboration with Leah Bush, MD regarding development and update of comprehensive plan of care as evidenced by provider attestation and co-signature Inter-disciplinary care team collaboration (see longitudinal plan of care) Fall prevention re-discussed with spouse Medications reviewed Assessed for falls since last encounter. Discussed tips for everyday care of patients with dementia: keep a routine, such as bathing dressing eating at the same time each day, plan activities that the person enjoys and try to do them at the same time each day, serve meals in a consistent, familiar place and give patient enough time to eat, encourage use of loose-fitting, comfortable, easy to use clothing such as, elastic waistbands, fabric fasteners, or large zippers pulls instead of shoelaces, buttons, or buckles.  Assessed caregiver's knowledge of fall risk prevention secondary to previously provided education. Reviewed scheduled/upcoming provider appointments including:  Patient is scheduled for an annual wellness exam with primary care provider on 04/23/2021.  Patient Goals:  -tips for everyday care of patients with dementia: keep a routine, such as bathing dressing eating at the same time each day, plan activities that the person enjoys and try to do them at the same time each day, serve meals in a consistent, familiar place and give patient enough time to eat, encourage use of loose-fitting, comfortable, easy to use clothing such as, elastic waistbands,  fabric fasteners, or large zippers pulls instead of shoelaces, buttons, or buckles.  - Continue with reminders to patient regarding the use of her walker.   - De-clutter walkways - Change positions slowly - Wear secure fitting shoes at all times with ambulation - Utilize home lighting for dim lit areas - Keep items used often in easy to reach places - Have provider complete community resource application once received.  (  RN will inform the social worker that the application has not been received as of yet) Follow Up Plan: The patient has been provided with contact information for the care management team and has been advised to call with any health related questions or concerns.  The care management team will reach out to the patient again over the next 45 days.           Plan:The patient has been provided with contact information for the care management team and has been advised to call with any health related questions or concerns.  and The care management team will reach out to the patient again over the next 45 days. Quinn Plowman RN,BSN,CCM RN Case Manager [place]  [phone]

## 2021-02-26 NOTE — Telephone Encounter (Signed)
   Telephone encounter was:  Successful.  02/26/2021 Name: [name] MRN: [id number] DOB: 1946/02/28  Leah Payne is a 75 y.o. year old female who is a primary care patient of Eustaquio Boyden, MD . The community resource team was consulted for assistance with  mailing PCS application to patient for husband to take to PCP office.  Care guide performed the following interventions: Patient provided with information about care guide support team and interviewed to confirm resource needs  Advised patient's husband I would mail out the [facility] form and verified the address on file.  Follow Up Plan:  No further follow up planned at this time. The patient has been provided with needed resources.  April Green Care Guide, Embedded Care Coordination [facility], Care Management Phone: [phone] Email: [email or web address]

## 2021-02-26 NOTE — Patient Instructions (Signed)
Visit Information: Thank you for taking the time to speak with me today.   PATIENT GOALS:  Goals Addressed             This Visit's Progress    Fall prevention-Dementia       Timeframe:  Long-Range Goal Priority:  High Start Date:  12/11/2020                           Expected End Date: 05/24/2021                    Follow Up Date 04/06/2021   -tips for everyday care of patients with dementia: keep a routine, such as bathing dressing eating at the same time each day, plan activities that the person enjoys and try to do them at the same time each day, serve meals in a consistent, familiar place and give patient enough time to eat, encourage use of loose-fitting, comfortable, easy to use clothing such as, elastic waistbands, fabric fasteners, or large zippers pulls instead of shoelaces, buttons, or buckles.  - Continue with reminders to patient regarding the use of her walker.   - De-clutter walkways - Change positions slowly - Wear secure fitting shoes at all times with ambulation - Utilize home lighting for dim lit areas - Keep items used often in easy to reach places - Have provider complete community resource application once received.  ( RN will inform the social worker that the application has not been received as of yet)   Why is this important?   There may be trouble with balance and getting around. Falls can happen.            The patient verbalized understanding of instructions, educational materials, and care plan provided today and agreed to receive a mailed copy of patient instructions, educational materials, and care plan.   The patient has been provided with contact information for the care management team and has been advised to call with any health related questions or concerns.  The care management team will reach out to the patient again over the next 45 days.   George Ina RN,BSN,CCM RN Case Manager [place] [place]  [phone]

## 2021-03-02 ENCOUNTER — Telehealth: Payer: Self-pay

## 2021-03-02 MED ORDER — LEVOTHYROXINE SODIUM 25 MCG PO TABS: 25.0000 ug | ORAL_TABLET | Freq: Every day | ORAL | 0 refills | Status: DC

## 2021-03-02 NOTE — Telephone Encounter (Signed)
Leah Payne spoke to Leah Payne and he declined urology evaluation. He was not aware [name] could worsen her dementia. He is okay with current treatment.

## 2021-03-02 NOTE — Telephone Encounter (Signed)
E-scribed refill 

## 2021-03-08 ENCOUNTER — Ambulatory Visit: Payer: Medicare Other | Admitting: *Deleted

## 2021-03-08 DIAGNOSIS — R5381 Other malaise: Secondary | ICD-10-CM

## 2021-03-08 DIAGNOSIS — F0280 Dementia in other diseases classified elsewhere without behavioral disturbance: Secondary | ICD-10-CM

## 2021-03-08 DIAGNOSIS — Z9181 History of falling: Secondary | ICD-10-CM

## 2021-03-08 NOTE — Patient Instructions (Signed)
Visit Information  PATIENT GOALS:  Goals Addressed             This Visit's Progress    Caregiver Coping Optimized       Timeframe:  Long-Range Goal Priority:  High Start Date:  12/11/2020                           Expected End Date:  03/24/2021  Follow up: 03/24/2021  Patient / caregiver goals:  -Take PCS application to office visit with PCP - Continue to try and identify available family/ friends who can assist with caregiving - use suggested community resources as appropriate - Set aside time for self if/ when able.  Identify those things that bring peace and relaxation - Notify social worker or RN case manager if list of community resources not received.             The patient verbalized understanding of instructions, educational materials, and care plan provided today and declined offer to receive copy of patient instructions, educational materials, and care plan.   Telephone follow up appointment with care management team member scheduled for:03/24/21 Reece Levy MSW, LCSW Licensed Clinical Social Worker [facility] [place] [phone]

## 2021-03-08 NOTE — Chronic Care Management (AMB) (Signed)
Chronic Care Management    Clinical Social Work Note  03/08/2021 Name: SHALAH ESTELLE MRN: [id number] DOB: Feb 08, 1946  [name] is a 75 y.o. year old female who is a primary care patient of [name], MD. The CCM team was consulted to assist the patient with chronic disease management and/or care coordination needs related to: [employer]  and Caregiver Stress.   Engaged with patient by telephone for follow up visit in response to provider referral for social work chronic care management and care coordination services.   Consent to Services:  The patient was given information about Chronic Care Management services, agreed to services, and gave verbal consent prior to initiation of services.  Please see initial visit note for detailed documentation.   Patient agreed to services and consent obtained.   Assessment: Review of patient past medical history, allergies, medications, and health status, including review of relevant consultants reports was performed today as part of a comprehensive evaluation and provision of chronic care management and care coordination services.     SDOH (Social Determinants of Health) assessments and interventions performed:    Advanced Directives Status: Not addressed in this encounter.  CCM Care Plan  No Known Allergies  Outpatient Encounter Medications as of 03/08/2021  Medication Sig   Accu-Chek FastClix Lancets MISC Use as instructed to check blood sugar once daily   Alcohol Swabs (B-D SINGLE USE SWABS REGULAR) PADS Use as instructed to check blood sugar once daily   atorvastatin (LIPITOR) 40 MG tablet Take 1 tablet (40 mg total) by mouth daily.   Blood Glucose Monitoring Suppl (ACCU-CHEK GUIDE) w/Device KIT 1 each by Does not apply route as directed. Use as instructed to check blood sugar once daily   Cholecalciferol (VITAMIN D) 50 MCG (2000 UT) CAPS Take 1 capsule (2,000 Units total) by mouth daily.   donepezil (ARICEPT) 10  MG tablet Take 1 tablet (10 mg total) by mouth daily.   glucose blood (ACCU-CHEK GUIDE) test strip Use as instructed to check blood sugar once daily   levETIRAcetam (KEPPRA) 500 MG tablet TAKE 1 TABLET(500 MG) BY MOUTH TWICE DAILY   levothyroxine (SYNTHROID) 25 MCG tablet Take 1 tablet (25 mcg total) by mouth daily before breakfast.   loperamide (IMODIUM A-D) 2 MG capsule Take 1 capsule (2 mg total) by mouth as needed for diarrhea or loose stools.   loratadine (CLARITIN) 10 MG tablet Take 1 tablet (10 mg total) by mouth daily.   metFORMIN (GLUCOPHAGE) 500 MG tablet TAKE 1 TABLET EVERY DAY WITH BREAKFAST   mirabegron ER ([name]) 50 MG TB24 tablet Take 1 tablet (50 mg total) by mouth at bedtime.   ondansetron (ZOFRAN) 4 MG tablet Take 1 tablet (4 mg total) by mouth every 8 (eight) hours as needed for nausea or vomiting.   propranolol (INDERAL) 10 MG tablet Take 1 tablet (10 mg total) by mouth 2 (two) times daily.   sertraline (ZOLOFT) 100 MG tablet Take 1 tablet (100 mg total) by mouth daily.   No facility-administered encounter medications on file as of 03/08/2021.    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   Candidal intertrigo 02/16/2018   History of fall 08/01/2017   Abdominal distension 05/16/2017  Vitamin D deficiency 03/21/2017   Medicare annual wellness visit, subsequent 09/23/2016   Health maintenance examination 09/23/2016   Advanced care planning/counseling discussion 09/23/2016   Dizziness 03/23/2016   Alzheimer disease ([place]) 12/17/2015   Imbalance 12/17/2015   WPW (Wolff-Parkinson-White syndrome)    Type 2 diabetes, controlled, 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[place]) 09/17/2015   Hypothyroidism 09/17/2015    Debility    Overactive bladder 08/29/2014   Seizure disorder ([place]) 08/04/2013   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   Hypertension 08/04/2013   Hyperlipidemia associated with type 2 diabetes mellitus ([place]) 08/04/2013    Conditions to be addressed/monitored: Dementia; Level of care concerns, Limited access to caregiver, Cognitive Deficits, and Memory Deficits  Care Plan : LCSW Plan of Care  Updates made by [name], LCSW since 03/08/2021 12:00 AM     Problem: Functional Decline and lack of caregiver knowledge for long term care planning/support   Priority: High  Note:   Current barriers:    Limited social support, Level of care concerns, Cognitive Deficits, Memory Deficits, Lacks knowledge of community resources, caregiver stress, and long term care planning/needs Clinical Goals: Patient will work with CSW to address needs related to Long term care planning and needs to provide safety and optimal care/QOL for patient with cognitive impairment and caregiving needs Clinical Interventions:  Pt's husband reports plans to take the PCS application to PCP office visit tomorrow.  CSW also discussed other info/program material and provided for resource/support and he wants to pursue the PCS first.  CSW encouraged husband to call CSW if questions or needs while at PCP tomorrow with paperwork or with questions/further needs.     CSW spoke with pt's husband who is primary caregiver for his wife who has Alzheimer's dementia.  Per husband, pt has full Medicaid as well as [facility] Medicare plan. Pt's husband does not want to pursue placement of pt at this time, but is in need of community resources, support to help in the safety and care of pt.  CSW discussed Medicaid programs and other community based support/programs and will consult Care Guide to follow up with husband in regards to these and other as identified Collaboration with [name], MD regarding development and update of  comprehensive plan of care as evidenced by provider attestation and co-signature Inter-disciplinary care team collaboration (see longitudinal plan of care) Assessment of needs, barriers , agencies contacted, as well as how impacting  Review various resources, discussed options and provided patient information about Referral to care guide for community support and other options  , PACE Program, Private pay options home health needs, Services provided by [employer] , Dementia resources and support , and Discussed several options for long term counseling based on need and insurance Patient interviewed and appropriate assessments performed Referred patient to community resources care guide team for assistance with community resources/support, long term care planning, level of care/facility options  Provided patient with information about options to consider with long term planning; in-home and facility options Discussed plans with patient for ongoing care management follow up and provided patient with direct contact information for care management team Assisted patient/caregiver with obtaining information about health plan benefits Provided education to patient/caregiver regarding level of care options. Clinical interventions provided:Solution-Focused Strategies, Active listening / Reflection utilized , Emotional Supportive Provided, Problem Solving /Task Center , Motivational Interviewing, Brief CBT , Caregiver stress acknowledged , Participation in support group encouraged , Consideration of in-home help encouraged , and community resources to be provided for review and  consideration Patient Goals/Self-Care Activities: Over the next 30 days Take PCS application to office visit with PCP review resource/material sent with community programs and support options begin a notebook of services in my neighborhood or community call 211 when I need some help follow-up on any referrals for help I am  given think ahead to make sure my need does not become an emergency make a note about what I need to have by the phone or take with me, like an identification card or social security number have a back-up plan have a back-up plan make a list of family or friends that I can call          Follow Up Plan: Appointment scheduled for SW follow up with client by phone on: 03/24/21      Eduard Clos MSW, [place] Licensed Clinical Social Worker [place] [phone]

## 2021-03-22 ENCOUNTER — Telehealth: Payer: Self-pay

## 2021-03-22 NOTE — Chronic Care Management (AMB) (Addendum)
Chronic Care Management Pharmacy Assistant   Name: Leah Payne  MRN: [id number] DOB: 05-May-1946  Reason for Encounter: Medication Adherence and Delivery Coordination   Recent office visits:  None since last CCM contact  Recent consult visits:  None since last CCM contact  Hospital visits:  None in previous 6 months  Medications: Outpatient Encounter Medications as of 03/22/2021  Medication Sig   Accu-Chek FastClix Lancets MISC Use as instructed to check blood sugar once daily   Alcohol Swabs (B-D SINGLE USE SWABS REGULAR) PADS Use as instructed to check blood sugar once daily   atorvastatin (LIPITOR) 40 MG tablet Take 1 tablet (40 mg total) by mouth daily.   Blood Glucose Monitoring Suppl (ACCU-CHEK GUIDE) w/Device KIT 1 each by Does not apply route as directed. Use as instructed to check blood sugar once daily   Cholecalciferol (VITAMIN D) 50 MCG (2000 UT) CAPS Take 1 capsule (2,000 Units total) by mouth daily.   donepezil (ARICEPT) 10 MG tablet Take 1 tablet (10 mg total) by mouth daily.   glucose blood (ACCU-CHEK GUIDE) test strip Use as instructed to check blood sugar once daily   levETIRAcetam (KEPPRA) 500 MG tablet TAKE 1 TABLET(500 MG) BY MOUTH TWICE DAILY   levothyroxine (SYNTHROID) 25 MCG tablet Take 1 tablet (25 mcg total) by mouth daily before breakfast.   loperamide (IMODIUM A-D) 2 MG capsule Take 1 capsule (2 mg total) by mouth as needed for diarrhea or loose stools.   loratadine (CLARITIN) 10 MG tablet Take 1 tablet (10 mg total) by mouth daily.   metFORMIN (GLUCOPHAGE) 500 MG tablet TAKE 1 TABLET EVERY DAY WITH BREAKFAST   mirabegron ER ([name]) 50 MG TB24 tablet Take 1 tablet (50 mg total) by mouth at bedtime.   ondansetron (ZOFRAN) 4 MG tablet Take 1 tablet (4 mg total) by mouth every 8 (eight) hours as needed for nausea or vomiting.   propranolol (INDERAL) 10 MG tablet Take 1 tablet (10 mg total) by mouth 2 (two) times daily.   sertraline (ZOLOFT) 100  MG tablet Take 1 tablet (100 mg total) by mouth daily.   No facility-administered encounter medications on file as of 03/22/2021.   BP Readings from Last 3 Encounters:  11/16/20 112/72  11/15/20 (!) 134/56  10/21/20 126/82    Lab Results  Component Value Date   HGBA1C 6.0 (A) 10/21/2020     No OVs, Consults, or hospital visits since last care coordination call / Pharmacist visit.  Last adherence delivery date:03/04/21      Patient is due for next adherence delivery on: 04/02/21  Spoke with patient on 03/23/21 reviewed medications and coordinated delivery.  This delivery to include: Adherence Packaging  30 Days  Packs: Atorvastatin 40 mg- 1 tablet evening meal Donepezil 10 mg - 1 tablet breakfast Levetiracetam 500 mg - 1 tablet breakfast 1 tablet evening meal Levothyroxine 25 mcg - 1 tablet before breakfast Metformin 500 mg- 1 tablet breakfast [name] 50 mg - 1 tablet bedtime Propranolol 10 mg - 1 tablet breakfast 1 tablet evening meal Sertraline 100 mg - 1 tablet breakfast Vitamin D3 2000 units - 1 capsule breakfast Loratadine 10mg  - 1 tablet evening meal   VIAL medications: none   Patient declined the following medications this month: Ondansetron 4mg  - 1 tablet every 8 hours as needed for nausea (just got refill and has enough supply)   Any concerns about your medications? No  How often do you forget or accidentally miss a dose? Never  Is patient in packaging Yes  If yes  What is the date on your next pill pack? 03/22/21  Any concerns or issues with your packaging?no   Refills requested from providers include:  Confirmed delivery date of 04/02/21, advised patient that pharmacy will contact them the morning of delivery.  No BG or BP readings available   Debbora Dus, CPP notified  Avel Sensor, [place] Assistant [phone]  I have reviewed the care management and care coordination activities outlined in this encounter and I am certifying  that I agree with the content of this note. No further action required.  Debbora Dus, PharmD Clinical Pharmacist [facility] [phone]

## 2021-03-24 ENCOUNTER — Ambulatory Visit: Payer: Medicare Other | Admitting: *Deleted

## 2021-03-24 DIAGNOSIS — F0280 Dementia in other diseases classified elsewhere without behavioral disturbance: Secondary | ICD-10-CM

## 2021-03-24 DIAGNOSIS — G309 Alzheimer's disease, unspecified: Secondary | ICD-10-CM

## 2021-03-24 DIAGNOSIS — R5381 Other malaise: Secondary | ICD-10-CM

## 2021-03-24 DIAGNOSIS — E1142 Type 2 diabetes mellitus with diabetic polyneuropathy: Secondary | ICD-10-CM

## 2021-03-24 DIAGNOSIS — Z9181 History of falling: Secondary | ICD-10-CM

## 2021-03-24 NOTE — Patient Instructions (Signed)
Visit Information  PATIENT GOALS:  Goals Addressed             This Visit's Progress    Caregiver Coping Optimized       Timeframe:  Long-Range Goal Priority:  High Start Date:  12/11/2020                           Expected End Date:  03/24/2021  Follow up: 03/24/2021  Patient / caregiver goals:  Expect call from [facility] re: PCS application  review resource/material sent with community programs and support options begin a notebook of services in my neighborhood or community call 211 when I need some help follow-up on any referrals for help I am given think ahead to make sure my need does not become an emergency make a note about what I need to have by the phone or take with me, like an identification card or social security number have a back-up plan have a back-up plan make a list of family or friends that I can call              The patient verbalized understanding of instructions, educational materials, and care plan provided today and declined offer to receive copy of patient instructions, educational materials, and care plan.   Telephone follow up appointment with care management team member scheduled for: in the next 2 weeks with updates on PCS  Reece Levy MSW, LCSW Licensed Clinical Social Worker [facility] [place] [phone]

## 2021-03-24 NOTE — Chronic Care Management (AMB) (Signed)
Chronic Care Management    Clinical Social Work Note  03/24/2021 Name: Leah Payne MRN: [id number] DOB: March 05, 1946  Leah Payne is a 75 y.o. year old female who is a primary care patient of Leah Boyden, MD. The CCM team was consulted to assist the patient with chronic disease management and/or care coordination needs related to: [employer] , Level of Care Concerns, and Caregiver Stress.   Engaged with patient by telephone for follow up visit in response to provider referral for social work chronic care management and care coordination services.   Consent to Services:  The patient was given information about Chronic Care Management services, agreed to services, and gave verbal consent prior to initiation of services.  Please see initial visit note for detailed documentation.   Patient agreed to services and consent obtained.   Assessment: Review of patient past medical history, allergies, medications, and health status, including review of relevant consultants reports was performed today as part of a comprehensive evaluation and provision of chronic care management and care coordination services.     SDOH (Social Determinants of Health) assessments and interventions performed:    Advanced Directives Status: Not addressed in this encounter.  CCM Care Plan  No Known Allergies  Outpatient Encounter Medications as of 03/24/2021  Medication Sig   Accu-Chek FastClix Lancets MISC Use as instructed to check blood sugar once daily   Alcohol Swabs (B-D SINGLE USE SWABS REGULAR) PADS Use as instructed to check blood sugar once daily   atorvastatin (LIPITOR) 40 MG tablet Take 1 tablet (40 mg total) by mouth daily.   Blood Glucose Monitoring Suppl (ACCU-CHEK GUIDE) w/Device KIT 1 each by Does not apply route as directed. Use as instructed to check blood sugar once daily   Cholecalciferol (VITAMIN D) 50 MCG (2000 UT) CAPS Take 1 capsule (2,000 Units total) by mouth daily.    donepezil (ARICEPT) 10 MG tablet Take 1 tablet (10 mg total) by mouth daily.   glucose blood (ACCU-CHEK GUIDE) test strip Use as instructed to check blood sugar once daily   levETIRAcetam (KEPPRA) 500 MG tablet TAKE 1 TABLET(500 MG) BY MOUTH TWICE DAILY   levothyroxine (SYNTHROID) 25 MCG tablet Take 1 tablet (25 mcg total) by mouth daily before breakfast.   loperamide (IMODIUM A-D) 2 MG capsule Take 1 capsule (2 mg total) by mouth as needed for diarrhea or loose stools.   loratadine (CLARITIN) 10 MG tablet Take 1 tablet (10 mg total) by mouth daily.   metFORMIN (GLUCOPHAGE) 500 MG tablet TAKE 1 TABLET EVERY DAY WITH BREAKFAST   mirabegron ER ([name]) 50 MG TB24 tablet Take 1 tablet (50 mg total) by mouth at bedtime.   ondansetron (ZOFRAN) 4 MG tablet Take 1 tablet (4 mg total) by mouth every 8 (eight) hours as needed for nausea or vomiting.   propranolol (INDERAL) 10 MG tablet Take 1 tablet (10 mg total) by mouth 2 (two) times daily.   sertraline (ZOLOFT) 100 MG tablet Take 1 tablet (100 mg total) by mouth daily.   No facility-administered encounter medications on file as of 03/24/2021.    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   Candidal intertrigo 02/16/2018   History of fall 08/01/2017  Abdominal distension 05/16/2017   Vitamin D deficiency 03/21/2017   Medicare annual wellness visit, subsequent 09/23/2016   Health maintenance examination 09/23/2016   Advanced care planning/counseling discussion 09/23/2016   Dizziness 03/23/2016   Alzheimer disease ([place]) 12/17/2015   Imbalance 12/17/2015   WPW (Wolff-Parkinson-White syndrome)    Type 2 diabetes, controlled, 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[place]) 09/17/2015    Hypothyroidism 09/17/2015   Debility    Overactive bladder 08/29/2014   Seizure disorder ([place]) 08/04/2013   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   Hypertension 08/04/2013   Hyperlipidemia associated with type 2 diabetes mellitus ([place]) 08/04/2013    Conditions to be addressed/monitored: Dementia; Limited social support, Level of care concerns, Limited access to caregiver, Cognitive Deficits, and Memory Deficits  Care Plan : LCSW Plan of Care  Updates made by Leah Peer, LCSW since 03/24/2021 12:00 AM     Problem: Functional Decline and lack of caregiver knowledge for long term care planning/support   Priority: High  Note:   Current barriers:    Limited social support, Level of care concerns, Cognitive Deficits, Memory Deficits, Lacks knowledge of community resources, caregiver stress, and long term care planning/needs Clinical Goals: Patient will work with CSW to address needs related to Long term care planning and needs to provide safety and optimal care/QOL for patient with cognitive impairment and caregiving needs Clinical Interventions:  Pt's husband delivered the [facility] application to PCP office visit and per PCP it has been completed and faxed for review. CSW advised husband to expect call from the [facility] office/[facility] for further processing and determination.  Meanwhile, husband reports things are "about the same".  CSW will attempt to get update from agency as well.  Collaboration with Leah Bush, MD regarding development and update of comprehensive plan of care as evidenced by provider attestation and co-signature Inter-disciplinary care team collaboration (see longitudinal plan of care) Assessment of needs, barriers , agencies contacted, as well as how impacting  Review various resources, discussed options and provided patient information about Referral to care guide for community support and other options  , PACE Program, Private pay options home health needs,  Services provided by [employer] , Dementia resources and support , and Discussed several options for long term counseling based on need and insurance Patient interviewed and appropriate assessments performed Referred patient to community resources care guide team for assistance with community resources/support, long term care planning, level of care/facility options  Provided patient with information about options to consider with long term planning; in-home and facility options Discussed plans with patient for ongoing care management follow up and provided patient with direct contact information for care management team Assisted patient/caregiver with obtaining information about health plan benefits Provided education to patient/caregiver regarding level of care options. Clinical interventions provided:Solution-Focused Strategies, Active listening / Reflection utilized , Emotional Supportive Provided, Problem Solving /Task Center , Motivational Interviewing, Brief CBT , Caregiver stress acknowledged , Participation in support group encouraged , Consideration of in-home help encouraged , and community resources to be provided for review and consideration Patient Goals/Self-Care Activities: Over the next 30 days Expect call from [facility] re: PCS application  review resource/material sent with community programs and support options begin a notebook of services in my neighborhood or community call 211 when I need some help follow-up on any referrals for help I am given think ahead to make sure my need does not become an emergency make a note about what I need to have by the phone or take  with me, like an identification card or social security number have a back-up plan have a back-up plan make a list of family or friends that I can call          Follow Up Plan: SW will follow up with patient by phone over the next 2 weeks      Eduard Clos MSW, [place] Licensed Clinical Social  Worker [place] [phone]

## 2021-04-06 ENCOUNTER — Ambulatory Visit (INDEPENDENT_AMBULATORY_CARE_PROVIDER_SITE_OTHER): Payer: Medicare Other

## 2021-04-06 DIAGNOSIS — F0280 Dementia in other diseases classified elsewhere without behavioral disturbance: Secondary | ICD-10-CM

## 2021-04-06 DIAGNOSIS — Z9181 History of falling: Secondary | ICD-10-CM

## 2021-04-06 DIAGNOSIS — G309 Alzheimer's disease, unspecified: Secondary | ICD-10-CM

## 2021-04-06 NOTE — Chronic Care Management (AMB) (Signed)
Chronic Care Management   CCM RN Visit Note  04/06/2021 Name: Leah Payne MRN: [id number] DOB: [birth year]  Subjective: Leah Payne is a 75 y.o. year old female who is a primary care patient of [name], MD. The care management team was consulted for assistance with disease management and care coordination needs.    Engaged with patient by telephone for follow up visit in response to provider referral for case management and/or care coordination services.   Consent to Services:  The patient was given information about Chronic Care Management services, agreed to services, and gave verbal consent prior to initiation of services.  Please see initial visit note for detailed documentation.   Patient agreed to services and verbal consent obtained.   Assessment: Review of patient past medical history, allergies, medications, health status, including review of consultants reports, laboratory and other test data, was performed as part of comprehensive evaluation and provision of chronic care management services.   SDOH (Social Determinants of Health) assessments and interventions performed:    CCM Care Plan  No Known Allergies  Outpatient Encounter Medications as of 04/06/2021  Medication Sig   Accu-Chek FastClix Lancets MISC Use as instructed to check blood sugar once daily   Alcohol Swabs (B-D SINGLE USE SWABS REGULAR) PADS Use as instructed to check blood sugar once daily   atorvastatin (LIPITOR) 40 MG tablet Take 1 tablet (40 mg total) by mouth daily.   Blood Glucose Monitoring Suppl (ACCU-CHEK GUIDE) w/Device KIT 1 each by Does not apply route as directed. Use as instructed to check blood sugar once daily   Cholecalciferol (VITAMIN D) 50 MCG (2000 UT) CAPS Take 1 capsule (2,000 Units total) by mouth daily.   donepezil (ARICEPT) 10 MG tablet Take 1 tablet (10 mg total) by mouth daily.   glucose blood (ACCU-CHEK GUIDE) test strip Use as instructed to check blood sugar  once daily   levETIRAcetam (KEPPRA) 500 MG tablet TAKE 1 TABLET(500 MG) BY MOUTH TWICE DAILY   levothyroxine (SYNTHROID) 25 MCG tablet Take 1 tablet (25 mcg total) by mouth daily before breakfast.   loperamide (IMODIUM A-D) 2 MG capsule Take 1 capsule (2 mg total) by mouth as needed for diarrhea or loose stools.   loratadine (CLARITIN) 10 MG tablet Take 1 tablet (10 mg total) by mouth daily.   metFORMIN (GLUCOPHAGE) 500 MG tablet TAKE 1 TABLET EVERY DAY WITH BREAKFAST   mirabegron ER ([name]) 50 MG TB24 tablet Take 1 tablet (50 mg total) by mouth at bedtime.   ondansetron (ZOFRAN) 4 MG tablet Take 1 tablet (4 mg total) by mouth every 8 (eight) hours as needed for nausea or vomiting.   propranolol (INDERAL) 10 MG tablet Take 1 tablet (10 mg total) by mouth 2 (two) times daily.   sertraline (ZOLOFT) 100 MG tablet Take 1 tablet (100 mg total) by mouth daily.   No facility-administered encounter medications on file as of 04/06/2021.    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   Candidal intertrigo 02/16/2018   History of fall 08/01/2017   Abdominal distension 05/16/2017   Vitamin D deficiency 03/21/2017   Medicare annual wellness visit, subsequent 09/23/2016   Health maintenance examination 09/23/2016   Advanced care planning/counseling  discussion 09/23/2016   Dizziness 03/23/2016   Alzheimer disease ([place]) 12/17/2015   Imbalance 12/17/2015   WPW (Wolff-Parkinson-White syndrome)    Type 2 diabetes, controlled, 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[place]) 09/17/2015   Hypothyroidism 09/17/2015   Debility    Overactive bladder 08/29/2014   Seizure disorder ([place]) 08/04/2013   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[place]) 08/04/2013    Hypertension 08/04/2013   Hyperlipidemia associated with type 2 diabetes mellitus ([place]) 08/04/2013    Conditions to be addressed/monitored:Dementia and Falls  Care Plan : Dementia (Adult)  Updates made by Dannielle Karvonen, RN since 04/06/2021 12:00 AM     Problem: Caregiver Stress   Priority: High     Long-Range Goal: Caregiver Coping Optimized   Start Date: 12/11/2020  Expected End Date: 06/23/2021  This Visit's Progress: On track  Recent Progress: On track  Priority: High  Note:   Current Barriers: Spouse confirms personal care aide application has been completed and turned in to provider office. Per chart review, provider office has completed and faxed to [facility] for processing.  Caregiver Role strain Caregiver with chronic health problems  Patient not able to independently care for herself Clinical Goal(s):  Collaboration with [name], MD regarding development and update of comprehensive plan of care as evidenced by provider attestation and co-signature Inter-disciplinary care team collaboration (see longitudinal plan of care) Caregiver will work with care management team to address care coordination and chronic disease management needs related to Disease Management, educational Needs, care coordination, caregiver stress support, dementia and caregiver support.  Interventions:  Evaluation of current treatment plan related to Dementia, Lacks knowledge of community resources, patient health management and adherence to plan as established by provider. Collaboration with [name], MD regarding development and update of comprehensive plan of care as evidenced by provider attestation       and co-signature Inter-disciplinary care team collaboration (see longitudinal plan of care)  Assessed caregivers ongoing ability to care for patient due to own chronic health conditions.  Assessed caregivers knowledge and ability to provide patient care, including bathing,  skin care, safety, nutrition, medications, and ambulation.  Allowed caregiver time to discuss problems, concerns, and feelings.  Discussed plans with cargiver for ongoing care management follow up and provided caregiver with direct contact information for care management team Patient/ Caregiver Goals: - Continue to try and identify available family/ friends who can assist with caregiving - use suggested community resources as appropriate - Set aside time for self if/ when able.  Identify those things that bring peace and relaxation - Notify social worker or RN case manager if you do not hear back from [facility] regarding personal care aide approval decision.  Follow Up Plan: The patient has been provided with contact information for the care management team and has been advised to call with any health related questions or concerns.  The care management team will reach out to the patient again over the next 30 days.      Problem: High risk for falls   Priority: High     Long-Range Goal: Patient and/ or caregiver will implement strategies to increase safety and prevent falls in the home.   Start Date: 12/11/2020  Expected End Date: 06/23/2021  This Visit's Progress: On track  Recent Progress: On track  Priority: High  Note:   Current Barriers: Telephone call to patient's spouse, Leah Payne.  HIPAA verified for patient.  Spouse denies patient having any falls since last outreach with RNCM.  He states, " things are  about the same."  Knowledge Deficits related to fall precautions in patient with dementia  Unable to self administer medications as prescribed Unable to perform IADLs independently Cognitive Deficits Clinical Goal(s):  patient will demonstrate improved adherence to prescribed treatment plan for decreasing falls as evidenced by patient utilizing walker when ambulating  Caregiver will verbalize using fall risk reduction strategies as discussed patient will minimize  falls patient will attend scheduled medical appointments Caregiver will verbalize basic understanding of dementia disease process and  patient health management plan as evidenced by implementing fall risk precautions, assisting patient with attending provider follow up visit, taking medications as prescribed, and Long term care planning.  The caregiver will work with the care management team toward's patients health management plan.  Interventions:  Collaboration with [name], MD regarding development and update of comprehensive plan of care as evidenced by provider attestation and co-signature Inter-disciplinary care team collaboration (see longitudinal plan of care) Fall prevention re-discussed with spouse Medications reviewed Assessed for falls since last encounter. Discussed tips for everyday care of patients with dementia: keep a routine, such as bathing dressing eating at the same time each day, plan activities that the person enjoys and try to do them at the same time each day, serve meals in a consistent, familiar place and give patient enough time to eat, encourage use of loose-fitting, comfortable, easy to use clothing such as, elastic waistbands, fabric fasteners, or large zippers pulls instead of shoelaces, buttons, or buckles.  Assessed caregiver's knowledge of fall risk prevention secondary to previously provided education. Reviewed scheduled/upcoming provider appointments including:  Patient is scheduled for an annual wellness exam with primary care provider on 04/23/2021.  Patient Goals:  -tips for everyday care of patients with dementia: keep a routine, such as bathing dressing eating at the same time each day, plan activities that the person enjoys and try to do them at the same time each day, serve meals in a consistent, familiar place and give patient enough time to eat, encourage use of loose-fitting, comfortable, easy to use clothing such as, elastic waistbands, fabric  fasteners, or large zippers pulls instead of shoelaces, buttons, or buckles.  - Continue with reminders to patient regarding the use of her walker.   - De-clutter walkways - Change positions slowly - Always wear secure fitting shoes at all times with ambulation - Utilize home lighting for dim lit areas - Keep items used often in easy to reach places - Continue to try and identify available family/ friends who can assist with caregiving - use suggested community resources as appropriate - Set aside time for self if/ when able.  Identify those things that bring peace and relaxation - Notify social worker or RN case manager if you do not hear back from [facility] regarding personal care aide approval decision.  Follow Up Plan: The patient has been provided with contact information for the care management team and has been advised to call with any health related questions or concerns.  The care management team will reach out to the patient again over the next 45 days.       Plan:The patient has been provided with contact information for the care management team and has been advised to call with any health related questions or concerns.  and The care management team will reach out to the patient again over the next 45 days. Quinn Plowman RN,BSN,CCM RN Case Manager [place]  [phone]

## 2021-04-06 NOTE — Patient Instructions (Signed)
Visit Information  PATIENT GOALS:  Goals Addressed             This Visit's Progress    Caregiver Coping Optimized   On track    Timeframe:  Long-Range Goal Priority:  High Start Date:  12/11/2020                           Expected End Date:  03/24/2021//  Follow up: 03/24/2021  Patient / caregiver goals:  Expect call from [facility] re: PCS application  review resource/material sent with community programs and support options begin a notebook of services in my neighborhood or community call 211 when I need some help follow-up on any referrals for help I am given think ahead to make sure my need does not become an emergency make a note about what I need to have by the phone or take with me, like an identification card or social security number have a back-up plan have a back-up plan make a list of family or friends that I can call   /        Fall prevention-Dementia   On track    Timeframe:  Long-Range Goal Priority:  High Start Date:  12/11/2020                           Expected End Date: 06/23/2021                  Follow Up Date 05/18/2021   -tips for everyday care of patients with dementia: keep a routine, such as bathing dressing eating at the same time each day, plan activities that the person enjoys and try to do them at the same time each day, serve meals in a consistent, familiar place and give patient enough time to eat, encourage use of loose-fitting, comfortable, easy to use clothing such as, elastic waistbands, fabric fasteners, or large zippers pulls instead of shoelaces, buttons, or buckles.  - Continue with reminders to patient regarding the use of her walker.   - De-clutter walkways - Change positions slowly - Always wear secure fitting shoes at all times with ambulation - Utilize home lighting for dim lit areas - Keep items used often in easy to reach places - Continue to try and identify available family/ friends who can assist with caregiving -  use suggested community resources as appropriate - Set aside time for self if/ when able.  Identify those things that bring peace and relaxation - Notify social worker or RN case manager if you do not hear back from [facility] regarding personal care aide approval decision.    Why is this important?   There may be trouble with balance and getting around. Falls can happen.            The patient verbalized understanding of instructions, educational materials, and care plan provided today and agreed to receive a mailed copy of patient instructions, educational materials, and care plan.   The patient has been provided with contact information for the care management team and has been advised to call with any health related questions or concerns.  The care management team will reach out to the patient again over the next 45 days.   George Ina RN,BSN,CCM RN Case Manager [facility] [place]  [phone]

## 2021-04-09 ENCOUNTER — Ambulatory Visit: Payer: Medicare Other | Admitting: *Deleted

## 2021-04-12 ENCOUNTER — Telehealth: Payer: Self-pay | Admitting: Family Medicine

## 2021-04-12 NOTE — Chronic Care Management (AMB) (Signed)
Chronic Care Management    Clinical Social Work Note  04/12/2021 Name: Leah Payne MRN: [id number] DOB: 1946/06/29  Leah Payne is a 75 y.o. year old female who is a primary care patient of Eustaquio Boyden, MD. The CCM team was consulted to assist the patient with chronic disease management and/or care coordination needs related to: [employer] , Level of Care Concerns, and Caregiver Stress.   Engaged with patient by telephone for follow up visit in response to provider referral for social work chronic care management and care coordination services.   Consent to Services:  The patient was given information about Chronic Care Management services, agreed to services, and gave verbal consent prior to initiation of services.  Please see initial visit note for detailed documentation.   Patient agreed to services and consent obtained.   Assessment: Review of patient past medical history, allergies, medications, and health status, including review of relevant consultants reports was performed today as part of a comprehensive evaluation and provision of chronic care management and care coordination services.     SDOH (Social Determinants of Health) assessments and interventions performed:    Advanced Directives Status: Not addressed in this encounter.  CCM Care Plan  No Known Allergies  Outpatient Encounter Medications as of 04/09/2021  Medication Sig   Accu-Chek FastClix Lancets MISC Use as instructed to check blood sugar once daily   Alcohol Swabs (B-D SINGLE USE SWABS REGULAR) PADS Use as instructed to check blood sugar once daily   atorvastatin (LIPITOR) 40 MG tablet Take 1 tablet (40 mg total) by mouth daily.   Blood Glucose Monitoring Suppl (ACCU-CHEK GUIDE) w/Device KIT 1 each by Does not apply route as directed. Use as instructed to check blood sugar once daily   Cholecalciferol (VITAMIN D) 50 MCG (2000 UT) CAPS Take 1 capsule (2,000 Units total) by mouth daily.    donepezil (ARICEPT) 10 MG tablet Take 1 tablet (10 mg total) by mouth daily.   glucose blood (ACCU-CHEK GUIDE) test strip Use as instructed to check blood sugar once daily   levETIRAcetam (KEPPRA) 500 MG tablet TAKE 1 TABLET(500 MG) BY MOUTH TWICE DAILY   levothyroxine (SYNTHROID) 25 MCG tablet Take 1 tablet (25 mcg total) by mouth daily before breakfast.   loperamide (IMODIUM A-D) 2 MG capsule Take 1 capsule (2 mg total) by mouth as needed for diarrhea or loose stools.   loratadine (CLARITIN) 10 MG tablet Take 1 tablet (10 mg total) by mouth daily.   metFORMIN (GLUCOPHAGE) 500 MG tablet TAKE 1 TABLET EVERY DAY WITH BREAKFAST   mirabegron ER ([name]) 50 MG TB24 tablet Take 1 tablet (50 mg total) by mouth at bedtime.   ondansetron (ZOFRAN) 4 MG tablet Take 1 tablet (4 mg total) by mouth every 8 (eight) hours as needed for nausea or vomiting.   propranolol (INDERAL) 10 MG tablet Take 1 tablet (10 mg total) by mouth 2 (two) times daily.   sertraline (ZOLOFT) 100 MG tablet Take 1 tablet (100 mg total) by mouth daily.   No facility-administered encounter medications on file as of 04/09/2021.    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   Candidal intertrigo 02/16/2018   History of fall 08/01/2017  Abdominal distension 05/16/2017   Vitamin D deficiency 03/21/2017   Medicare annual wellness visit, subsequent 09/23/2016   Health maintenance examination 09/23/2016   Advanced care planning/counseling discussion 09/23/2016   Dizziness 03/23/2016   Alzheimer disease ([place]) 12/17/2015   Imbalance 12/17/2015   WPW (Wolff-Parkinson-White syndrome)    Type 2 diabetes, controlled, 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[place]) 09/17/2015    Hypothyroidism 09/17/2015   Debility    Overactive bladder 08/29/2014   Seizure disorder ([place]) 08/04/2013   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   Hypertension 08/04/2013   Hyperlipidemia associated with type 2 diabetes mellitus ([place]) 08/04/2013    Conditions to be addressed/monitored: Dementia; Level of care concerns, Cognitive Deficits, and Memory Deficits  Care Plan : LCSW Plan of Care  Updates made by [name], LCSW since 04/12/2021 12:00 AM     Problem: Functional Decline and lack of caregiver knowledge for long term care planning/support   Priority: High  Note:   Current barriers:    Limited social support, Level of care concerns, Cognitive Deficits, Memory Deficits, Lacks knowledge of community resources, caregiver stress, and long term care planning/needs Clinical Goals: Patient will work with CSW to address needs related to Long term care planning and needs to provide safety and optimal care/QOL for patient with cognitive impairment and caregiving needs Clinical Interventions:  Pt's husband has not heard from [facility] about PCS application- awaiting in-home face to face assessment. CSW reminded husband to expect call from the [facility] office/[facility] for further processing and determination.  Meanwhile, husband reports he is considering finding a new PCP for pt and himself as their PCP is being relocated until Feb 2023 to an office "too far away for [place]".  Per husband, he is looking into providers in [place]. CSW suggested possible Virtual visits if appropriate during the office temporary relocating.   Collaboration with [name], MD regarding development and update of comprehensive plan of care as evidenced by provider attestation and co-signature Inter-disciplinary care team collaboration (see longitudinal plan of care) Assessment of needs, barriers , agencies contacted, as well as how impacting  Review various resources, discussed options and provided  patient information about Referral to care guide for community support and other options  , PACE Program, Private pay options home health needs, Services provided by [employer] , Dementia resources and support , and Discussed several options for long term counseling based on need and insurance Patient interviewed and appropriate assessments performed Referred patient to community resources care guide team for assistance with community resources/support, long term care planning, level of care/facility options  Provided patient with information about options to consider with long term planning; in-home and facility options Discussed plans with patient for ongoing care management follow up and provided patient with direct contact information for care management team Assisted patient/caregiver with obtaining information about health plan benefits Provided education to patient/caregiver regarding level of care options. Clinical interventions provided:Solution-Focused Strategies, Active listening / Reflection utilized , Emotional Supportive Provided, Problem Solving /Task Center , Motivational Interviewing, Brief CBT , Caregiver stress acknowledged , Participation in support group encouraged , Consideration of in-home help encouraged , and community resources to be provided for review and consideration Patient Goals/Self-Care Activities: Over the next 30 days Expect call from [facility] re: PCS application  review resource/material sent with community programs and support options begin a notebook of services in my neighborhood or community call 211 when I need some help follow-up on any referrals for help I am given think ahead to  make sure my need does not become an emergency make a note about what I need to have by the phone or take with me, like an identification card or social security number have a back-up plan have a back-up plan make a list of family or friends that I can call           Follow Up Plan: Appointment scheduled for SW follow up with client by phone on: 05/06/21      Eduard Clos MSW, [place] Licensed Clinical Social Worker [place] [phone]

## 2021-04-12 NOTE — Patient Instructions (Signed)
Visit Information  PATIENT GOALS:  Goals Addressed             This Visit's Progress    Caregiver Coping Optimized       Timeframe:  Long-Range Goal Priority:  High Start Date:  12/11/2020                           Expected End Date:  05/24/2021  Follow up: 05/06/2021  Patient / caregiver goals:  Expect call from [facility] re: PCS application  review resource/material sent with community programs and support options begin a notebook of services in my neighborhood or community call 211 when I need some help follow-up on any referrals for help I am given think ahead to make sure my need does not become an emergency make a note about what I need to have by the phone or take with me, like an identification card or social security number have a back-up plan have a back-up plan make a list of family or friends that I can call   /           The patient verbalized understanding of instructions, educational materials, and care plan provided today and declined offer to receive copy of patient instructions, educational materials, and care plan.   Telephone follow up appointment with care management team member scheduled for:05/06/21  Reece Levy MSW, LCSW Licensed Clinical Social Worker [facility] [place] [phone]

## 2021-04-12 NOTE — Telephone Encounter (Signed)
Opened in error

## 2021-04-16 ENCOUNTER — Ambulatory Visit: Payer: Medicare Other

## 2021-04-19 ENCOUNTER — Other Ambulatory Visit: Payer: Self-pay | Admitting: Family Medicine

## 2021-04-19 DIAGNOSIS — E1142 Type 2 diabetes mellitus with diabetic polyneuropathy: Secondary | ICD-10-CM

## 2021-04-19 DIAGNOSIS — E785 Hyperlipidemia, unspecified: Secondary | ICD-10-CM

## 2021-04-19 DIAGNOSIS — G40909 Epilepsy, unspecified, not intractable, without status epilepticus: Secondary | ICD-10-CM

## 2021-04-19 DIAGNOSIS — E559 Vitamin D deficiency, unspecified: Secondary | ICD-10-CM

## 2021-04-19 DIAGNOSIS — E039 Hypothyroidism, unspecified: Secondary | ICD-10-CM

## 2021-04-19 DIAGNOSIS — E1169 Type 2 diabetes mellitus with other specified complication: Secondary | ICD-10-CM

## 2021-04-20 ENCOUNTER — Other Ambulatory Visit: Payer: Medicare Other

## 2021-04-20 ENCOUNTER — Other Ambulatory Visit: Payer: Self-pay | Admitting: Family Medicine

## 2021-04-20 NOTE — Telephone Encounter (Signed)
Keppra last filled:  03/31/21, #180 [name] last filled:  03/31/21, #90 Propranolol last filled:  03/31/21, #180 Last OV:  10/21/20, f/u Next OV:  04/27/21, AWV

## 2021-04-21 ENCOUNTER — Other Ambulatory Visit: Payer: Self-pay

## 2021-04-21 MED ORDER — METFORMIN HCL 500 MG PO TABS: ORAL_TABLET | ORAL | 0 refills | Status: DC

## 2021-04-23 ENCOUNTER — Ambulatory Visit: Payer: Medicare Other

## 2021-04-23 ENCOUNTER — Telehealth: Payer: Self-pay

## 2021-04-23 DIAGNOSIS — G309 Alzheimer's disease, unspecified: Secondary | ICD-10-CM

## 2021-04-23 DIAGNOSIS — F0280 Dementia in other diseases classified elsewhere without behavioral disturbance: Secondary | ICD-10-CM | POA: Diagnosis not present

## 2021-04-23 NOTE — Chronic Care Management (AMB) (Addendum)
Chronic Care Management Pharmacy Assistant   Name: Leah Payne  MRN: [id number] DOB: May 23, 1946  Reason for Encounter: Medication Adherence and Delivery Coordination   Recent office visits:  04/09/21 - Family Medicine - CCM Social Worker visit  Recent consult visits:  None since last CCM contact  Hospital visits:  None in previous 6 months  Medications: Outpatient Encounter Medications as of 04/23/2021  Medication Sig   Accu-Chek FastClix Lancets MISC Use as instructed to check blood sugar once daily   Alcohol Swabs (B-D SINGLE USE SWABS REGULAR) PADS Use as instructed to check blood sugar once daily   atorvastatin (LIPITOR) 40 MG tablet Take 1 tablet (40 mg total) by mouth daily.   Blood Glucose Monitoring Suppl (ACCU-CHEK GUIDE) w/Device KIT 1 each by Does not apply route as directed. Use as instructed to check blood sugar once daily   Cholecalciferol (VITAMIN D) 50 MCG (2000 UT) CAPS Take 1 capsule (2,000 Units total) by mouth daily.   donepezil (ARICEPT) 10 MG tablet Take 1 tablet (10 mg total) by mouth daily.   glucose blood (ACCU-CHEK GUIDE) test strip Use as instructed to check blood sugar once daily   levETIRAcetam (KEPPRA) 500 MG tablet TAKE ONE TABLET BY MOUTH TWICE DAILY   levothyroxine (SYNTHROID) 25 MCG tablet Take 1 tablet (25 mcg total) by mouth daily before breakfast.   loperamide (IMODIUM A-D) 2 MG capsule Take 1 capsule (2 mg total) by mouth as needed for diarrhea or loose stools.   loratadine (CLARITIN) 10 MG tablet Take 1 tablet (10 mg total) by mouth daily.   metFORMIN (GLUCOPHAGE) 500 MG tablet TAKE 1 TABLET EVERY DAY WITH BREAKFAST   [name] 50 MG TB24 tablet TAKE ONE TABLET BY MOUTH EVERYDAY AT BEDTIME   ondansetron (ZOFRAN) 4 MG tablet Take 1 tablet (4 mg total) by mouth every 8 (eight) hours as needed for nausea or vomiting.   propranolol (INDERAL) 10 MG tablet TAKE ONE TABLET BY MOUTH TWICE DAILY   sertraline (ZOLOFT) 100 MG tablet Take 1 tablet  (100 mg total) by mouth daily.   No facility-administered encounter medications on file as of 04/23/2021.   BP Readings from Last 3 Encounters:  11/16/20 112/72  11/15/20 (!) 134/56  10/21/20 126/82    Lab Results  Component Value Date   HGBA1C 6.0 (A) 10/21/2020      No OVs, Consults, or hospital visits since last care coordination call / Pharmacist visit. No medication changes indicated   Last adherence delivery date:04/02/21      Patient is due for next adherence delivery on: 05/04/21  Spoke with patient on 04/23/21 reviewed medications and coordinated delivery.  This delivery to include: Adherence Packaging  30 Days  Packs: Atorvastatin 40 mg- 1 tablet evening meal Donepezil 10 mg - 1 tablet breakfast Levetiracetam 500 mg - 1 tablet breakfast 1 tablet evening meal Levothyroxine 25 mcg - 1 tablet before breakfast Metformin 500 mg- 1 tablet breakfast [name] 50 mg - 1 tablet bedtime Propranolol 10 mg - 1 tablet breakfast 1 tablet evening meal Sertraline 100 mg - 1 tablet breakfast Vitamin D3 2000 units - 1 capsule breakfast Loratadine 57m - 1 tablet evening meal    VIAL medications: none  Patient declined the following medications this month: none  Any concerns about your medications? No  The Husband Leah Jewinformed me today that he is trying to move his PCP closer to where he lives. This is in process and he does not want to lose  the pharmacy or the CCM team.  How often do you forget or accidentally miss a dose? Never  Do you use a pillbox? Yes  - adherence packs  Is patient in packaging Yes  What is the date on your next pill pack? 04/30/21  Refills requested from providers include: Metformin 569m, Propranolol 177m Levetiracetam 50039mMyrbetriq 39m43monepezil 10mg76morvastatin 40mg.65mnfirmed delivery date of 05/04/21, advised patient that pharmacy will contact them the morning of delivery.  No readings available  Annual wellness visit in last year?    02/24/20 Most Recent BP reading: 112/72  90-P  11/16/20  If Diabetic: Most recent A1C reading: 6.0  10/21/20  MichelDebbora Dusnotified  VelmenAvel Sensor CSheltontant [phone] reviewed the care management and care coordination activities outlined in this encounter and I am certifying that I agree with the content of this note. No further action required.  MichelDebbora DusmD Clinical Pharmacist [phone]

## 2021-04-27 ENCOUNTER — Encounter: Payer: Medicare Other | Admitting: Family Medicine

## 2021-05-06 ENCOUNTER — Ambulatory Visit (INDEPENDENT_AMBULATORY_CARE_PROVIDER_SITE_OTHER): Payer: Medicare Other | Admitting: *Deleted

## 2021-05-06 DIAGNOSIS — E1169 Type 2 diabetes mellitus with other specified complication: Secondary | ICD-10-CM

## 2021-05-06 DIAGNOSIS — E785 Hyperlipidemia, unspecified: Secondary | ICD-10-CM

## 2021-05-06 DIAGNOSIS — F0280 Dementia in other diseases classified elsewhere without behavioral disturbance: Secondary | ICD-10-CM

## 2021-05-06 DIAGNOSIS — Z9181 History of falling: Secondary | ICD-10-CM

## 2021-05-06 DIAGNOSIS — G309 Alzheimer's disease, unspecified: Secondary | ICD-10-CM

## 2021-05-06 DIAGNOSIS — R2689 Other abnormalities of gait and mobility: Secondary | ICD-10-CM

## 2021-05-07 NOTE — Chronic Care Management (AMB) (Signed)
Chronic Care Management    Clinical Social Work Note  05/07/2021 Name: Leah Payne MRN: [id number] DOB: 09-23-1945  Leah Payne is a 75 y.o. year old female who is a primary care patient of [name], MD. The CCM team was consulted to assist the patient with chronic disease management and/or care coordination needs related to: [employer] , Level of Care Concerns, and Caregiver Stress.   Engaged with patient by telephone for follow up visit in response to provider referral for social work chronic care management and care coordination services.   Consent to Services:  The patient was given information about Chronic Care Management services, agreed to services, and gave verbal consent prior to initiation of services.  Please see initial visit note for detailed documentation.   Patient agreed to services and consent obtained.   Assessment: Review of patient past medical history, allergies, medications, and health status, including review of relevant consultants reports was performed today as part of a comprehensive evaluation and provision of chronic care management and care coordination services.     SDOH (Social Determinants of Health) assessments and interventions performed:    Advanced Directives Status: Not addressed in this encounter.  CCM Care Plan  No Known Allergies  Outpatient Encounter Medications as of 05/06/2021  Medication Sig   Accu-Chek FastClix Lancets MISC Use as instructed to check blood sugar once daily   Alcohol Swabs (B-D SINGLE USE SWABS REGULAR) PADS Use as instructed to check blood sugar once daily   atorvastatin (LIPITOR) 40 MG tablet Take 1 tablet (40 mg total) by mouth daily.   Blood Glucose Monitoring Suppl (ACCU-CHEK GUIDE) w/Device KIT 1 each by Does not apply route as directed. Use as instructed to check blood sugar once daily   Cholecalciferol (VITAMIN D) 50 MCG (2000 UT) CAPS Take 1 capsule (2,000 Units total) by mouth daily.    donepezil (ARICEPT) 10 MG tablet Take 1 tablet (10 mg total) by mouth daily.   glucose blood (ACCU-CHEK GUIDE) test strip Use as instructed to check blood sugar once daily   levETIRAcetam (KEPPRA) 500 MG tablet TAKE ONE TABLET BY MOUTH TWICE DAILY   levothyroxine (SYNTHROID) 25 MCG tablet Take 1 tablet (25 mcg total) by mouth daily before breakfast.   loperamide (IMODIUM A-D) 2 MG capsule Take 1 capsule (2 mg total) by mouth as needed for diarrhea or loose stools.   loratadine (CLARITIN) 10 MG tablet Take 1 tablet (10 mg total) by mouth daily.   metFORMIN (GLUCOPHAGE) 500 MG tablet TAKE 1 TABLET EVERY DAY WITH BREAKFAST   [name] 50 MG TB24 tablet TAKE ONE TABLET BY MOUTH EVERYDAY AT BEDTIME   ondansetron (ZOFRAN) 4 MG tablet Take 1 tablet (4 mg total) by mouth every 8 (eight) hours as needed for nausea or vomiting.   propranolol (INDERAL) 10 MG tablet TAKE ONE TABLET BY MOUTH TWICE DAILY   sertraline (ZOLOFT) 100 MG tablet Take 1 tablet (100 mg total) by mouth daily.   No facility-administered encounter medications on file as of 05/06/2021.    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   Candidal intertrigo 02/16/2018   History of fall 08/01/2017   Abdominal distension 05/16/2017   Vitamin D deficiency 03/21/2017  Medicare annual wellness visit, subsequent 09/23/2016   Health maintenance examination 09/23/2016   Advanced care planning/counseling discussion 09/23/2016   Dizziness 03/23/2016   Alzheimer disease ([place]) 12/17/2015   Imbalance 12/17/2015   WPW (Wolff-Parkinson-White syndrome)    Type 2 diabetes, controlled, 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[place]) 09/17/2015   Hypothyroidism 09/17/2015   Debility     Overactive bladder 08/29/2014   Seizure disorder ([place]) 08/04/2013   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[place]) 08/04/2013   Hypertension 08/04/2013   Hyperlipidemia associated with type 2 diabetes mellitus ([place]) 08/04/2013    Conditions to be addressed/monitored: Dementia; Level of care concerns, Limited access to caregiver, Cognitive Deficits, and Memory Deficits  Care Plan : LCSW Plan of Care  Updates made by [name], LCSW since 05/07/2021 12:00 AM     Problem: Functional Decline and lack of caregiver knowledge for long term care planning/support   Priority: High  Note:   Current barriers:    Limited social support, Level of care concerns, Cognitive Deficits, Memory Deficits, Lacks knowledge of community resources, caregiver stress, and long term care planning/needs Clinical Goals: Patient will work with CSW to address needs related to Long term care planning and needs to provide safety and optimal care/QOL for patient with cognitive impairment and caregiving needs Clinical Interventions:   CSW updated pt's husband after making contact with [facility] about PCS status that pt is "active with [facility]".  CSW contacted [facility] who indicate they are not certain of any open/active service case for her but will investigate.   CSW updated pt's husband that he should have received a letter in the mail indicating the 3 of hours she was approved for but he does not recall seeing this.  CSW made a call back to [facility] and await some sort of update.    aCollaboration with [name], MD regarding development and update of comprehensive plan of care as evidenced by provider attestation and co-signature Inter-disciplinary care team collaboration (see longitudinal plan of care) Assessment of needs, barriers , agencies contacted, as well as how impacting  Review various resources, discussed options and provided patient information about Referral to care guide for community support and other  options  , PACE Program, Private pay options home health needs, Services provided by [employer] , Dementia resources and support , and Discussed several options for long term counseling based on need and insurance Patient interviewed and appropriate assessments performed Referred patient to community resources care guide team for assistance with community resources/support, long term care planning, level of care/facility options  Provided patient with information about options to consider with long term planning; in-home and facility options Discussed plans with patient for ongoing care management follow up and provided patient with direct contact information for care management team Assisted patient/caregiver with obtaining information about health plan benefits Provided education to patient/caregiver regarding level of care options. Clinical interventions provided:Solution-Focused Strategies, Active listening / Reflection utilized , Emotional Supportive Provided, Problem Solving /Task Center , Motivational Interviewing, Brief CBT , Caregiver stress acknowledged , Participation in support group encouraged , Consideration of in-home help encouraged , and community resources to be provided for review and consideration Patient Goals/Self-Care Activities: Over the next 30 days Expect call from [facility] re: PCS application  review resource/material sent with community programs and support options begin a notebook of services in my neighborhood or community call 211 when I need some help follow-up on any referrals for help I am given think ahead to make sure my need does not  become an emergency make a note about what I need to have by the phone or take with me, like an identification card or social security number have a back-up plan have a back-up plan make a list of family or friends that I can call          Follow Up Plan: SW will follow up with patient by phone over the next 7 days       Eduard Clos MSW, [place] Licensed Clinical Social Worker [place] [phone]

## 2021-05-07 NOTE — Patient Instructions (Signed)
Visit Information  PATIENT GOALS:  Goals Addressed   None     The patient verbalized understanding of instructions, educational materials, and care plan provided today and declined offer to receive copy of patient instructions, educational materials, and care plan.   Telephone follow up appointment with care management team member scheduled for: 3-7 days  Reece Levy MSW, LCSW Licensed Clinical Social Worker [facility] [phone]

## 2021-05-11 ENCOUNTER — Other Ambulatory Visit: Payer: Self-pay

## 2021-05-11 MED ORDER — ATORVASTATIN CALCIUM 40 MG PO TABS: 40.0000 mg | ORAL_TABLET | Freq: Every day | ORAL | 0 refills | Status: DC

## 2021-05-11 NOTE — Addendum Note (Signed)
Addended by: Nanci Pina on: 05/11/2021 02:19 PM   Modules accepted: Orders

## 2021-05-11 NOTE — Telephone Encounter (Addendum)
Donepezil Last rx:  07/02/20, #90 Last OV:  10/21/20, f/u Next OV:  none

## 2021-05-12 ENCOUNTER — Ambulatory Visit: Payer: Medicare Other | Admitting: *Deleted

## 2021-05-12 DIAGNOSIS — R5381 Other malaise: Secondary | ICD-10-CM

## 2021-05-12 DIAGNOSIS — R2689 Other abnormalities of gait and mobility: Secondary | ICD-10-CM

## 2021-05-12 DIAGNOSIS — G309 Alzheimer's disease, unspecified: Secondary | ICD-10-CM

## 2021-05-12 DIAGNOSIS — F0280 Dementia in other diseases classified elsewhere without behavioral disturbance: Secondary | ICD-10-CM

## 2021-05-12 DIAGNOSIS — Z9181 History of falling: Secondary | ICD-10-CM

## 2021-05-14 MED ORDER — DONEPEZIL HCL 10 MG PO TABS: 10.0000 mg | ORAL_TABLET | Freq: Every day | ORAL | 0 refills | Status: DC

## 2021-05-14 NOTE — Chronic Care Management (AMB) (Signed)
Chronic Care Management    Clinical Social Work Note  05/14/2021 Name: Leah Payne MRN: [id number] DOB: [birth year]  Leah Payne is a 75 y.o. year old female who is a primary care patient of [name], DO. The CCM team was consulted to assist the patient with chronic disease management and/or care coordination needs related to: Level of Care Concerns and Caregiver Stress.   Engaged with patient by telephone for follow up visit in response to provider referral for social work chronic care management and care coordination services.   Consent to Services:  The patient was given information about Chronic Care Management services, agreed to services, and gave verbal consent prior to initiation of services.  Please see initial visit note for detailed documentation.   Patient agreed to services and consent obtained.   Assessment: Review of patient past medical history, allergies, medications, and health status, including review of relevant consultants reports was performed today as part of a comprehensive evaluation and provision of chronic care management and care coordination services.     SDOH (Social Determinants of Health) assessments and interventions performed:  SDOH Interventions    Flowsheet Row Most Recent Value  SDOH Interventions   Transportation Interventions Intervention Not Indicated        Advanced Directives Status: Not addressed in this encounter.  CCM Care Plan  No Known Allergies  Outpatient Encounter Medications as of 05/12/2021  Medication Sig   Accu-Chek FastClix Lancets MISC Use as instructed to check blood sugar once daily   Alcohol Swabs (B-D SINGLE USE SWABS REGULAR) PADS Use as instructed to check blood sugar once daily   atorvastatin (LIPITOR) 40 MG tablet Take 1 tablet (40 mg total) by mouth daily.   Blood Glucose Monitoring Suppl (ACCU-CHEK GUIDE) w/Device KIT 1 each by Does not apply route as directed. Use as instructed to check blood  sugar once daily   Cholecalciferol (VITAMIN D) 50 MCG (2000 UT) CAPS Take 1 capsule (2,000 Units total) by mouth daily.   donepezil (ARICEPT) 10 MG tablet Take 1 tablet (10 mg total) by mouth daily.   glucose blood (ACCU-CHEK GUIDE) test strip Use as instructed to check blood sugar once daily   levETIRAcetam (KEPPRA) 500 MG tablet TAKE ONE TABLET BY MOUTH TWICE DAILY   levothyroxine (SYNTHROID) 25 MCG tablet Take 1 tablet (25 mcg total) by mouth daily before breakfast.   loperamide (IMODIUM A-D) 2 MG capsule Take 1 capsule (2 mg total) by mouth as needed for diarrhea or loose stools.   loratadine (CLARITIN) 10 MG tablet Take 1 tablet (10 mg total) by mouth daily.   metFORMIN (GLUCOPHAGE) 500 MG tablet TAKE 1 TABLET EVERY DAY WITH BREAKFAST   [name] 50 MG TB24 tablet TAKE ONE TABLET BY MOUTH EVERYDAY AT BEDTIME   ondansetron (ZOFRAN) 4 MG tablet Take 1 tablet (4 mg total) by mouth every 8 (eight) hours as needed for nausea or vomiting.   propranolol (INDERAL) 10 MG tablet TAKE ONE TABLET BY MOUTH TWICE DAILY   sertraline (ZOLOFT) 100 MG tablet Take 1 tablet (100 mg total) by mouth daily.   No facility-administered encounter medications on file as of 05/12/2021.    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[place]) 09/17/2015   Hypothyroidism 09/17/2015   Debility    Overactive bladder 08/29/2014   Seizure disorder ([place]) 08/04/2013   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[place]) 08/04/2013   Hypertension 08/04/2013   Hyperlipidemia associated with type 2 diabetes mellitus ([place]) 08/04/2013    Conditions to be addressed/monitored: Dementia; Level of care concerns  Care Plan : LCSW Plan of Care  Updates made by Leah Peer, LCSW since 05/14/2021 12:00 AM     Problem: Functional Decline and lack of caregiver knowledge for long term care planning/support   Priority: High  Note:   Current barriers:    Limited social support, Level of care concerns, Cognitive Deficits, Memory Deficits, Lacks knowledge of community resources, caregiver stress, and long term care planning/needs Clinical Goals: Patient will work with CSW to address needs related to Long term care planning and needs to provide safety and optimal care/QOL for patient with cognitive impairment and caregiving needs Clinical Interventions:   CSW called to give update to pt and her husband regarding PCS status- per pt, her husband is hospitalized. Pt reports having neighbors/friends helping out with her needs.  CSW alerted wife to the updates for her PCS needs and will call pt's husband next week to further guide in next steps.   aCollaboration with [name], MD regarding development and update of comprehensive plan of care as evidenced by provider attestation and co-signature Inter-disciplinary care team collaboration (see longitudinal plan of care) Assessment of needs, barriers , agencies contacted, as well as how impacting  Review various resources, discussed options and provided patient information about Referral to care guide for community support and other options  , PACE Program, Private pay options home health needs, Services provided by [employer] , Dementia resources  and support , and Discussed several options for long term counseling based on need and insurance Patient interviewed and appropriate assessments performed Referred patient to community resources care guide team for assistance with community resources/support, long term care planning, level of care/facility options  Provided patient with information about options to consider with long term planning; in-home and facility options Discussed plans with patient for ongoing care management follow up and provided patient with direct contact information for care management team Assisted patient/caregiver with obtaining information about health plan benefits Provided education to patient/caregiver regarding level of care options. Clinical interventions provided:Solution-Focused Strategies, Active listening / Reflection utilized , Emotional Supportive Provided, Problem Solving /Task Center , Motivational Interviewing, Brief CBT , Caregiver stress acknowledged , Participation in support group encouraged , Consideration of in-home help encouraged , and community resources to be provided for review and consideration Patient Goals/Self-Care Activities: Over the next 30 days Expect call from [facility] re: PCS application  review resource/material sent with community programs and support options begin a notebook of services in my neighborhood or community call 211 when I need some help follow-up on any referrals for help I am given think ahead to make sure my need does not become an emergency make a note about what I need to have by the phone or take with me, like an  identification card or social security number have a back-up plan have a back-up plan make a list of family or friends that I can call          Follow Up Plan: SW will follow up with patient by phone over the next 10 days      Eduard Clos MSW, [place] Licensed Clinical Social Worker [place] [phone]

## 2021-05-14 NOTE — Patient Instructions (Signed)
Visit Information  PATIENT GOALS:  Goals Addressed             This Visit's Progress    Caregiver Coping Optimized       Timeframe:  Long-Range Goal Priority:  High Start Date:  12/11/2020                           Expected End Date:  07/23/2021  Follow up: 05/17/2021  Patient / caregiver goals:  Call  [facility] re: agency change  review resource/material sent with community programs and support options begin a notebook of services in my neighborhood or community call 211 when I need some help follow-up on any referrals for help I am given think ahead to make sure my need does not become an emergency make a note about what I need to have by the phone or take with me, like an identification card or social security number have a back-up plan have a back-up plan make a list of family or friends that I can call   /           The patient verbalized understanding of instructions, educational materials, and care plan provided today and declined offer to receive copy of patient instructions, educational materials, and care plan.   Telephone follow up appointment with care management team member scheduled for:05/17/21  Reece Levy MSW, LCSW Licensed Clinical Social Worker [facility] [phone]

## 2021-05-17 ENCOUNTER — Ambulatory Visit: Payer: Medicare Other | Admitting: *Deleted

## 2021-05-17 DIAGNOSIS — F0280 Dementia in other diseases classified elsewhere without behavioral disturbance: Secondary | ICD-10-CM

## 2021-05-17 DIAGNOSIS — Z9181 History of falling: Secondary | ICD-10-CM

## 2021-05-17 DIAGNOSIS — G309 Alzheimer's disease, unspecified: Secondary | ICD-10-CM

## 2021-05-17 DIAGNOSIS — R5381 Other malaise: Secondary | ICD-10-CM

## 2021-05-18 ENCOUNTER — Ambulatory Visit: Payer: Medicare Other

## 2021-05-18 DIAGNOSIS — Z9181 History of falling: Secondary | ICD-10-CM

## 2021-05-18 DIAGNOSIS — G309 Alzheimer's disease, unspecified: Secondary | ICD-10-CM

## 2021-05-18 DIAGNOSIS — F0280 Dementia in other diseases classified elsewhere without behavioral disturbance: Secondary | ICD-10-CM

## 2021-05-18 NOTE — Patient Instructions (Signed)
Visit Information  PATIENT GOALS:  Goals Addressed   None     The patient verbalized understanding of instructions, educational materials, and care plan provided today and declined offer to receive copy of patient instructions, educational materials, and care plan.   Telephone follow up appointment with care management team member scheduled for:05/26/21  Reece Levy MSW, LCSW Licensed Clinical Social Worker [facility] [phone]

## 2021-05-18 NOTE — Patient Instructions (Signed)
Visit Information:  Thank you for taking the time to speak with me today.   PATIENT GOALS:  Goals Addressed             This Visit's Progress    Caregiver Coping Optimized   On track    Timeframe:  Long-Range Goal Priority:  High Start Date:  12/11/2020                           Expected End Date:  09/21/2020  Follow up: Patient will have a new embedded case manager for ongoing follow up.  - Continue to try and identify available family/ friends who can assist with caregiving - use suggested community resources as appropriate - Set aside time for self if/ when able.  Identify those things that bring peace and relaxation - continue to work with Child psychotherapist regarding personal care services for patient.  - You will have a new case manager, Irving Shows for ongoing follow up.         Fall prevention-Dementia   On track    Timeframe:  Long-Range Goal Priority:  High Start Date:  12/11/2020                           Expected End Date: 09/21/2020               Follow Up Date:  Patient will transition to new embedded case manager for follow up.    -tips for everyday care of patients with dementia: keep a routine, such as bathing dressing eating at the same time each day, plan activities that the person enjoys and try to do them at the same time each day, serve meals in a consistent, familiar place and give patient enough time to eat, encourage use of loose-fitting, comfortable, easy to use clothing such as, elastic waistbands, fabric fasteners, or large zippers pulls instead of shoelaces, buttons, or buckles.  - Continue with reminders to patient regarding the use of her walker.   - De-clutter walkways - Change positions slowly - Always wear secure fitting shoes at all times with ambulation - Utilize home lighting for dim lit areas - Keep items used often in easy to reach places - Continue to try and identify available family/ friends who can assist with caregiving - use suggested  community resources as appropriate - Set aside time for self if/ when able.  Identify those things that bring peace and relaxation - Continue to work with Child psychotherapist regarding personal care services. - You will have a new case manager, Irving Shows for ongoing follow up.   Why is this important?   There may be trouble with balance and getting around. Falls can happen.             The patient verbalized understanding of instructions, educational materials, and care plan provided today and agreed to receive a mailed copy of patient instructions, educational materials, and care plan.  The patient has been provided with contact information for the care management team and has been advised to call with any health related questions or concerns.  The care management team will reach out to the patient again over the next 1-2 months   George Ina RN,BSN,CCM RN Case Manager [place]  [phone]

## 2021-05-18 NOTE — Chronic Care Management (AMB) (Signed)
Current barriers:    Limited access to caregiver, Cognitive Deficits, and Lacks knowledge of community resource: CD Clinical Goals: Patient will work with CSW to address needs related to [facility] care needs Clinical Interventions:  Assessment of needs, barriers , agencies contacted, as well as how impacting  Caregiver stress acknowledged  Review various resources, discussed options and provided patient information about  Personal Care Services (PCS) 1:1 collaboration with primary care provider regarding development and update of comprehensive plan of care as evidenced by provider attestation and co-signature Inter-disciplinary care team collaboration (see longitudinal plan of care) Patient Goals/Self-Care Activities: Over the next 30 days Call PCS as instructed

## 2021-05-18 NOTE — Chronic Care Management (AMB) (Signed)
Chronic Care Management   CCM RN Visit Note  05/18/2021 Name: Leah Payne MRN: [id number] DOB: 07-Aug-1945  Subjective: Leah Payne is a 75 y.o. year old female who is a primary care patient of [name], DO. The care management team was consulted for assistance with disease management and care coordination needs.    Engaged with patient by telephone for follow up visit in response to provider referral for case management and/or care coordination services.   Consent to Services:  The patient was given information about Chronic Care Management services, agreed to services, and gave verbal consent prior to initiation of services.  Please see initial visit note for detailed documentation.   Patient agreed to services and verbal consent obtained.   Assessment: Review of patient past medical history, allergies, medications, health status, including review of consultants reports, laboratory and other test data, was performed as part of comprehensive evaluation and provision of chronic care management services.   SDOH (Social Determinants of Health) assessments and interventions performed:    CCM Care Plan  No Known Allergies  Outpatient Encounter Medications as of 05/18/2021  Medication Sig   Accu-Chek FastClix Lancets MISC Use as instructed to check blood sugar once daily   Alcohol Swabs (B-D SINGLE USE SWABS REGULAR) PADS Use as instructed to check blood sugar once daily   atorvastatin (LIPITOR) 40 MG tablet Take 1 tablet (40 mg total) by mouth daily.   Blood Glucose Monitoring Suppl (ACCU-CHEK GUIDE) w/Device KIT 1 each by Does not apply route as directed. Use as instructed to check blood sugar once daily   Cholecalciferol (VITAMIN D) 50 MCG (2000 UT) CAPS Take 1 capsule (2,000 Units total) by mouth daily.   donepezil (ARICEPT) 10 MG tablet Take 1 tablet (10 mg total) by mouth daily.   glucose blood (ACCU-CHEK GUIDE) test strip Use as instructed to check blood sugar once  daily   levETIRAcetam (KEPPRA) 500 MG tablet TAKE ONE TABLET BY MOUTH TWICE DAILY   levothyroxine (SYNTHROID) 25 MCG tablet Take 1 tablet (25 mcg total) by mouth daily before breakfast.   loperamide (IMODIUM A-D) 2 MG capsule Take 1 capsule (2 mg total) by mouth as needed for diarrhea or loose stools.   loratadine (CLARITIN) 10 MG tablet Take 1 tablet (10 mg total) by mouth daily.   metFORMIN (GLUCOPHAGE) 500 MG tablet TAKE 1 TABLET EVERY DAY WITH BREAKFAST   [name] 50 MG TB24 tablet TAKE ONE TABLET BY MOUTH EVERYDAY AT BEDTIME   ondansetron (ZOFRAN) 4 MG tablet Take 1 tablet (4 mg total) by mouth every 8 (eight) hours as needed for nausea or vomiting.   propranolol (INDERAL) 10 MG tablet TAKE ONE TABLET BY MOUTH TWICE DAILY   sertraline (ZOLOFT) 100 MG tablet Take 1 tablet (100 mg total) by mouth daily.   No facility-administered encounter medications on file as of 05/18/2021.    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[place]) 09/19/2019   Chronic diarrhea 05/06/2019   Polydactyly of thumb 01/31/2019   Candidal intertrigo 02/16/2018   History of fall 08/01/2017   Abdominal distension 05/16/2017   Vitamin D deficiency 03/21/2017   Medicare annual wellness visit, subsequent 09/23/2016   Health maintenance examination 09/23/2016   Advanced care planning/counseling discussion 09/23/2016   Dizziness 03/23/2016   Alzheimer  disease ([place]) 12/17/2015   Imbalance 12/17/2015   WPW (Wolff-Parkinson-White syndrome)    Type 2 diabetes, controlled, 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[place]) 09/17/2015   Hypothyroidism 09/17/2015   Debility    Overactive bladder 08/29/2014   Seizure disorder ([place]) 08/04/2013   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  He states his neighbor checks in on patient and makes sure she takes her medications if/ when he is admitted to the hospital.  Spouse states he continues to work with the Education officer, museum regarding personal care services for patient. Caregiver Role strain Caregiver with chronic health problems  Patient not able to independently care for herself Clinical Goal(s):  Collaboration with [name], MD regarding development and update of comprehensive plan of care as evidenced by provider attestation and co-signature Inter-disciplinary care team collaboration (see longitudinal plan of care) Caregiver will work with care management team to address care coordination and chronic disease management needs related to Disease Management, educational Needs, care coordination, caregiver stress support, dementia and caregiver support.  Interventions:  Evaluation of current treatment plan related to Dementia, Lacks knowledge of community resources, patient health management and adherence to plan as established by provider. Collaboration with [name], MD regarding development and update of comprehensive plan of care as evidenced by provider attestation       and co-signature Inter-disciplinary care team collaboration (see longitudinal plan of care)  Assessed caregivers ongoing ability to care for patient due to own chronic health conditions.  Assessed caregivers knowledge and ability to provide patient care, including  bathing, skin care, safety, nutrition, medications, and ambulation.  Allowed caregiver time to discuss problems, concerns, and feelings.  Discussed plans with cargiver for ongoing care management follow up and provided caregiver with direct contact information for care management team Patient/ Caregiver Goals: - Continue to try and identify available family/ friends who can assist with caregiving - use suggested community resources as appropriate - Set aside time for self if/ when able.  Identify those things that bring peace and relaxation - continue to work with Education officer, museum regarding personal care services for patient.  - You will have a new case manager, Jacqlyn Larsen for ongoing follow up.  Follow Up Plan: The patient has been provided with contact information for the care management team and has been advised to call with any health related questions or concerns.  The care management team will reach out to the patient again over the next 1-2 months.       Problem: High risk for falls   Priority: High     Long-Range Goal: Patient and/ or caregiver will implement strategies to increase safety and prevent falls in the home.   Start Date: 12/11/2020  Expected End Date: 09/21/2021  This Visit's Progress: On track  Recent Progress: On track  Priority: High  Note:   Current Barriers: Telephone call to patient's spouse, Lakeita Panther.  HIPAA verified for patient.  Spouse  denies patient having any  falls since last outreach with RNCM.  He states, " things are about the same."  Spouse reports he continues to work with Education officer, museum regarding personal care services for patient. Spouse states patient is scheduled to see her new primary care provider, Dr. Elmore Guise on 06/10/2021.  Knowledge Deficits related to fall precautions in patient with dementia  Unable to self administer medications as prescribed Unable to perform IADLs independently Cognitive Deficits Clinical Goal(s):  patient will  demonstrate improved adherence to prescribed treatment plan for decreasing falls as evidenced by patient utilizing walker when ambulating  Caregiver will verbalize using fall risk reduction strategies as discussed patient will minimize falls patient will attend scheduled medical appointments Caregiver will verbalize basic understanding of dementia disease process and  patient health management plan as evidenced by implementing fall risk precautions, assisting patient with attending provider follow up visit, taking medications as prescribed, and Long term care planning.  The caregiver will continue to work with the care management team toward's patients health management plan.  Interventions:  Collaboration with [name], MD regarding development and update of comprehensive plan of care as evidenced by provider attestation and co-signature Inter-disciplinary care team collaboration (see longitudinal plan of care):  RNCM informed spouse that patient will transition to new embedded case manager, Jacqlyn Larsen for ongoing follow up due to having new primary care provider.  Fall prevention re-discussed with spouse Medications reviewed Assessed for falls since last encounter. Discussed tips for everyday care of patients with dementia: keep a routine, such as bathing dressing eating at the same time each day, plan activities that the person enjoys and try to do them at the same time each day, serve meals in a consistent, familiar place and give patient enough time to eat, encourage use of loose-fitting, comfortable, easy to use clothing such as, elastic waistbands, fabric fasteners, or large zippers pulls instead of shoelaces, buttons, or buckles.  Assessed caregiver's knowledge of fall risk prevention secondary to previously provided education. Reviewed scheduled/upcoming provider appointments  Patient Goals:  -tips for everyday care of patients with dementia: keep a routine, such as bathing  dressing eating at the same time each day, plan activities that the person enjoys and try to do them at the same time each day, serve meals in a consistent, familiar place and give patient enough time to eat, encourage use of loose-fitting, comfortable, easy to use clothing such as, elastic waistbands, fabric fasteners, or large zippers pulls instead of shoelaces, buttons, or buckles.  - Continue with reminders to patient regarding the use of her walker.   - De-clutter walkways - Change positions slowly - Always wear secure fitting shoes at all times with ambulation - Utilize home lighting for dim lit areas - Keep items used often in easy to reach places - Continue to try and identify available family/ friends who can assist with caregiving - use suggested community resources as appropriate - Set aside time for self if/ when able.  Identify those things that bring peace and relaxation - Continue to work with Education officer, museum regarding personal care services. - You will have a new case manager, Jacqlyn Larsen for ongoing follow up.  Follow Up Plan: The patient has been provided with contact information for the care management team and has been advised to call with any health related questions or concerns.  The care management team will reach out to the patient again over the next 1-2 months.        Plan:The patient has been  provided with contact information for the care management team and has been advised to call with any health related questions or concerns.  The care management team will reach out to the patient again over the next 1-2 months. Quinn Plowman RN,BSN,CCM RN Case Manager [place]  [phone]

## 2021-05-20 ENCOUNTER — Emergency Department (HOSPITAL_COMMUNITY)
Admission: EM | Admit: 2021-05-20 | Discharge: 2021-05-21 | Disposition: A | Discharge status: Home / Self Care | Payer: Medicare Other | Attending: Emergency Medicine | Admitting: Emergency Medicine

## 2021-05-20 ENCOUNTER — Other Ambulatory Visit: Payer: Self-pay

## 2021-05-20 ENCOUNTER — Encounter (HOSPITAL_COMMUNITY): Payer: Self-pay

## 2021-05-20 DIAGNOSIS — Z823 Family history of stroke: Secondary | ICD-10-CM | POA: Diagnosis not present

## 2021-05-20 DIAGNOSIS — Z79899 Other long term (current) drug therapy: Secondary | ICD-10-CM | POA: Diagnosis not present

## 2021-05-20 DIAGNOSIS — Z7984 Long term (current) use of oral hypoglycemic drugs: Secondary | ICD-10-CM | POA: Insufficient documentation

## 2021-05-20 DIAGNOSIS — Z20822 Contact with and (suspected) exposure to covid-19: Secondary | ICD-10-CM | POA: Diagnosis not present

## 2021-05-20 DIAGNOSIS — R6889 Other general symptoms and signs: Secondary | ICD-10-CM | POA: Diagnosis not present

## 2021-05-20 DIAGNOSIS — R0689 Other abnormalities of breathing: Secondary | ICD-10-CM | POA: Diagnosis not present

## 2021-05-20 DIAGNOSIS — R42 Dizziness and giddiness: Secondary | ICD-10-CM | POA: Diagnosis not present

## 2021-05-20 DIAGNOSIS — R5381 Other malaise: Secondary | ICD-10-CM | POA: Insufficient documentation

## 2021-05-20 DIAGNOSIS — E119 Type 2 diabetes mellitus without complications: Secondary | ICD-10-CM | POA: Diagnosis not present

## 2021-05-20 DIAGNOSIS — R410 Disorientation, unspecified: Secondary | ICD-10-CM | POA: Diagnosis present

## 2021-05-20 DIAGNOSIS — E039 Hypothyroidism, unspecified: Secondary | ICD-10-CM | POA: Insufficient documentation

## 2021-05-20 DIAGNOSIS — I456 Pre-excitation syndrome: Secondary | ICD-10-CM | POA: Diagnosis not present

## 2021-05-20 DIAGNOSIS — I10 Essential (primary) hypertension: Secondary | ICD-10-CM | POA: Diagnosis not present

## 2021-05-20 DIAGNOSIS — Z743 Need for continuous supervision: Secondary | ICD-10-CM | POA: Diagnosis not present

## 2021-05-20 DIAGNOSIS — F0390 Unspecified dementia without behavioral disturbance: Secondary | ICD-10-CM | POA: Insufficient documentation

## 2021-05-20 LAB — URINALYSIS, ROUTINE W REFLEX MICROSCOPIC
Bilirubin Urine: NEGATIVE
Glucose, UA: NEGATIVE mg/dL
Hgb urine dipstick: NEGATIVE
Ketones, ur: NEGATIVE mg/dL
Leukocytes,Ua: NEGATIVE
Nitrite: NEGATIVE
Protein, ur: NEGATIVE mg/dL
Specific Gravity, Urine: 1.004 — ABNORMAL LOW (ref 1.005–1.030)
pH: 5 (ref 5.0–8.0)

## 2021-05-20 LAB — CBC WITH DIFFERENTIAL/PLATELET
Abs Immature Granulocytes: 0.02 10*3/uL (ref 0.00–0.07)
Basophils Absolute: 0.1 10*3/uL (ref 0.0–0.1)
Basophils Relative: 1 %
Eosinophils Absolute: 0.2 10*3/uL (ref 0.0–0.5)
Eosinophils Relative: 5 %
HCT: 38.6 % (ref 36.0–46.0)
Hemoglobin: 12.5 g/dL (ref 12.0–15.0)
Immature Granulocytes: 0 %
Lymphocytes Relative: 37 %
Lymphs Abs: 1.8 10*3/uL (ref 0.7–4.0)
MCH: 28.9 pg (ref 26.0–34.0)
MCHC: 32.4 g/dL (ref 30.0–36.0)
MCV: 89.1 fL (ref 80.0–100.0)
Monocytes Absolute: 0.5 10*3/uL (ref 0.1–1.0)
Monocytes Relative: 10 %
Neutro Abs: 2.4 10*3/uL (ref 1.7–7.7)
Neutrophils Relative %: 47 %
Platelets: 244 10*3/uL (ref 150–400)
RBC: 4.33 MIL/uL (ref 3.87–5.11)
RDW: 13.1 % (ref 11.5–15.5)
WBC: 5 10*3/uL (ref 4.0–10.5)
nRBC: 0 % (ref 0.0–0.2)

## 2021-05-20 LAB — COMPREHENSIVE METABOLIC PANEL
ALT: 18 U/L (ref 0–44)
AST: 19 U/L (ref 15–41)
Albumin: 3.8 g/dL (ref 3.5–5.0)
Alkaline Phosphatase: 83 U/L (ref 38–126)
Anion gap: 6 (ref 5–15)
BUN: 8 mg/dL (ref 8–23)
CO2: 26 mmol/L (ref 22–32)
Calcium: 9 mg/dL (ref 8.9–10.3)
Chloride: 103 mmol/L (ref 98–111)
Creatinine, Ser: 0.68 mg/dL (ref 0.44–1.00)
GFR, Estimated: >60 mL/min (ref >60)
Glucose, Bld: 108 mg/dL — ABNORMAL HIGH (ref 70–99)
Potassium: 3.9 mmol/L (ref 3.5–5.1)
Sodium: 135 mmol/L (ref 135–145)
Total Bilirubin: 0.6 mg/dL (ref 0.3–1.2)
Total Protein: 7.6 g/dL (ref 6.5–8.1)

## 2021-05-20 LAB — RESP PANEL BY RT-PCR (FLU A&B, COVID) ARPGX2
Influenza A by PCR: NEGATIVE
Influenza B by PCR: NEGATIVE
SARS Coronavirus 2 by RT PCR: NEGATIVE

## 2021-05-20 LAB — CBG MONITORING, ED: Glucose-Capillary: 82 mg/dL (ref 70–99)

## 2021-05-20 MED ORDER — LEVETIRACETAM 500 MG PO TABS
500.0000 mg | ORAL_TABLET | Freq: Two times a day (BID) | ORAL | Status: DC
  Administered 2021-05-20 – 2021-05-21 (×2): 500 mg via ORAL
  Filled 2021-05-20 (×2): qty 1

## 2021-05-20 MED ORDER — MIRABEGRON ER 25 MG PO TB24
50.0000 mg | ORAL_TABLET | Freq: Every day | ORAL | Status: DC
  Administered 2021-05-20 – 2021-05-21 (×2): 50 mg via ORAL
  Filled 2021-05-20 (×4): qty 2

## 2021-05-20 MED ORDER — LEVOTHYROXINE SODIUM 50 MCG PO TABS: 25.0000 ug | ORAL_TABLET | Freq: Every day | ORAL | Status: DC

## 2021-05-20 MED ORDER — SERTRALINE HCL 50 MG PO TABS: 100.0000 mg | ORAL_TABLET | Freq: Every day | ORAL | Status: DC

## 2021-05-20 MED ORDER — PROPRANOLOL HCL 10 MG PO TABS
10.0000 mg | ORAL_TABLET | Freq: Two times a day (BID) | ORAL | Status: DC
  Administered 2021-05-20 – 2021-05-21 (×2): 10 mg via ORAL
  Filled 2021-05-20 (×2): qty 1

## 2021-05-20 MED ORDER — METFORMIN HCL 500 MG PO TABS
500.0000 mg | ORAL_TABLET | Freq: Every day | ORAL | Status: DC
  Administered 2021-05-21: 500 mg via ORAL
  Filled 2021-05-20 (×2): qty 1

## 2021-05-20 MED ORDER — LEVOTHYROXINE SODIUM 50 MCG PO TABS
25.0000 ug | ORAL_TABLET | Freq: Every day | ORAL | Status: DC
  Administered 2021-05-21: 25 ug via ORAL
  Filled 2021-05-20: qty 1

## 2021-05-20 MED ORDER — SERTRALINE HCL 50 MG PO TABS
100.0000 mg | ORAL_TABLET | Freq: Every day | ORAL | Status: DC
  Administered 2021-05-21: 100 mg via ORAL
  Filled 2021-05-20 (×2): qty 2

## 2021-05-20 NOTE — ED Triage Notes (Signed)
Pt c/o dizziness and just don't feel good arrived via [facility].

## 2021-05-20 NOTE — ED Notes (Addendum)
Grape juice given to pt to encourage urination.

## 2021-05-20 NOTE — ED Provider Notes (Signed)
[facility] EMERGENCY DEPARTMENT Provider Note   CSN: [id number] Arrival date & time: 05/20/21  6222     History Chief Complaint  Patient presents with   Dizziness    Leah Payne is a 75 y.o. female history of dementia presents to the emergency department via EMS for evaluation of confusion today.  The patient's husband reports that the patient reports she was not feeling well and stood up to walk with her walker to go to the bathroom when she sat down on the bed and forgot where the bathroom was. This concerned the husband to be called EMS.  Patient is alert and oriented to time, person, and place, but repeats herself often and is unsure why she is here because she "feels fine".  Level 5 caveat secondary to patient's dementia.   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) - saw Dr Hal Neer - no benefit from shunt placement    Hyperlipidemia 08/04/2013   Hyponatremia 08/29/2014   Preaxial polydactyly of right hand congenital   Seizure disorder ([place]) 08/04/2013   Tachycardia    WPW (Wolff-Parkinson-White syndrome)    has seen Dr Wynonia Lawman    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[place]) 09/19/2019   Chronic diarrhea 05/06/2019   Polydactyly of thumb 01/31/2019   Candidal intertrigo 02/16/2018   History of fall 08/01/2017   Abdominal distension 05/16/2017   Vitamin D deficiency 03/21/2017    Medicare annual wellness visit, subsequent 09/23/2016   Health maintenance examination 09/23/2016   Advanced care planning/counseling discussion 09/23/2016   Dizziness 03/23/2016   Alzheimer disease ([place]) 12/17/2015   Imbalance 12/17/2015   WPW (Wolff-Parkinson-White syndrome)    Type 2 diabetes, controlled, 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     Family History  Problem Relation Age of Onset   Stroke Mother 13   Pneumonia Mother    CAD Father 47   Alzheimer's disease Father    Pneumonia Father    Cancer Maternal Aunt        lung (non-smoker)   Diabetes Neg Hx     Social History   Tobacco Use   Smoking status: Never   Smokeless tobacco: Never  Vaping Use   Vaping Use: Never used  Substance Use Topics   Alcohol use: No    Alcohol/week: 0.0 standard drinks   Drug use: No    Home Medications Prior to Admission medications   Medication Sig Start Date End Date Taking? Authorizing Provider  Accu-Chek [employer]  Lancets MISC Use as instructed to check blood sugar once daily 09/06/19   [name], MD  Alcohol Swabs (B-D SINGLE USE SWABS REGULAR) PADS Use as instructed to check blood sugar once daily 09/06/19   [name], MD  atorvastatin (LIPITOR) 40 MG tablet Take 1 tablet (40 mg total) by mouth daily. 05/11/21   [name], MD  Blood Glucose Monitoring Suppl (ACCU-CHEK GUIDE) w/Device KIT 1 each by Does not apply route as directed. Use as instructed to check blood sugar once daily 09/06/19   [name], MD  Cholecalciferol (VITAMIN D) 50 MCG (2000 UT) CAPS Take 1  capsule (2,000 Units total) by mouth daily. 02/02/19   [name], MD  donepezil (ARICEPT) 10 MG tablet Take 1 tablet (10 mg total) by mouth daily. 05/14/21   [name], MD  glucose blood (ACCU-CHEK GUIDE) test strip Use as instructed to check blood sugar once daily 09/06/19   [name], MD  levETIRAcetam (KEPPRA) 500 MG tablet TAKE ONE TABLET BY MOUTH TWICE DAILY 04/22/21   [name], MD  levothyroxine (SYNTHROID) 25 MCG tablet Take 1 tablet (25 mcg total) by mouth daily before breakfast. 03/02/21   [name], MD  loperamide (IMODIUM A-D) 2 MG capsule Take 1 capsule (2 mg total) by mouth as needed for diarrhea or loose stools. 12/05/19   [name], MD  loratadine (CLARITIN) 10 MG tablet Take 1 tablet (10 mg total) by mouth daily. 05/31/19   [name], MD  metFORMIN (GLUCOPHAGE) 500 MG tablet TAKE 1 TABLET EVERY DAY WITH BREAKFAST 04/21/21   [name], MD  [name] 50 MG TB24 tablet TAKE ONE TABLET BY MOUTH EVERYDAY AT BEDTIME 04/22/21   [name], MD  ondansetron (ZOFRAN) 4 MG tablet Take 1 tablet (4 mg total) by mouth every 8 (eight) hours as needed for nausea or vomiting. 02/25/21   [name], MD  propranolol (INDERAL) 10 MG tablet TAKE ONE TABLET BY MOUTH TWICE DAILY 04/22/21   [name], MD  sertraline (ZOLOFT) 100 MG tablet Take 1 tablet (100 mg total) by mouth daily. 10/29/20   [name], MD    Allergies    Patient has no known allergies.  Review of Systems   Review of Systems  Unable to perform ROS: Dementia  Neurological:  Positive for dizziness.   Physical Exam Updated Vital Signs BP (!) 173/86 (BP Location: Right Arm)   Pulse (!) 58   Temp 98 F (36.7 C) (Oral)   Resp 19   Ht $R'5\' 2"'tN$  (1.575 m)   Wt 68 kg   SpO2 100%   BMI 27.44 kg/m   Physical Exam Vitals and nursing note reviewed.  Constitutional:      General: She is not in acute distress.    Appearance: Normal appearance. She is not  toxic-appearing.     Comments: Pleasantly demented.  Unkempt.  HENT:     Head: Normocephalic and atraumatic.  Eyes:     General: No scleral icterus. Cardiovascular:     Rate and Rhythm: Regular rhythm. Bradycardia present.  Pulmonary:     Effort: Pulmonary effort is normal. No respiratory distress.     Breath sounds: Normal breath sounds. No wheezing or rales.  Abdominal:     General: Abdomen is flat. Bowel sounds are normal.     Palpations: Abdomen is soft.     Tenderness: There is no guarding or rebound.  Musculoskeletal:        General: No deformity.     Cervical  back: Normal range of motion.  Skin:    General: Skin is warm and dry.     Comments: Venous stasis to bilateral lower extremities  Neurological:     General: No focal deficit present.     Mental Status: She is alert. Mental status is at baseline.     Cranial Nerves: No cranial nerve deficit.     Motor: No weakness.     Comments: Oriented to person, place, time, but not contacts.  Patient repeats herself often.  Patient is ambulatory with walker at baseline.    ED Results / Procedures / Treatments   Labs (all labs ordered are listed, but only abnormal results are displayed) Labs Reviewed - No data to display  EKG None  Radiology No results found.  Procedures Procedures   Medications Ordered in ED Medications - No data to display  ED Course  I have reviewed the triage vital signs and the nursing notes.  Pertinent labs & imaging results that were available during my care of the patient were reviewed by me and considered in my medical decision making (see chart for details).  Patient with history of dementia presents to the emergency department for an episode of confusion.  Physical exam reassuring.  Patient has no sensory deficits.  No cranial nerve deficits.  Strength is equal in upper and lower bilateral extremities.  Patient converses well without slurred speech.  No facial droop noted.  I personally  reviewed the patient's labs.  CBC normal without leukocytosis or anemia.  CMP shows no electrolyte abnormalities.  Mild hyperglycemia at 108.  Urinalysis shows dilute urine, otherwise normal.  Respiratory panel negative for COVID or flu.  The patient's husband was recently admitted to the hospital and she will have no one at home to take care of her other than a neighbor checking on her once per day.  Due to the patient's unsteady ambulatory status, dementia, it is not safe for the patient to go home.  PT and social work consulted.  Waiting on PT to respond to consult.  Will board until then.  Home medications and diet ordered.  Handing off patient.    7:43 PM Care of Leah Payne transferred to [place] at the end of my shift as the patient will require reassessment once labs/imaging have resulted. Patient presentation, ED course, and plan of care discussed with review of all pertinent labs and imaging. Please see his/her note for further details regarding further ED course and disposition. Plan at time of handoff is boarding until eval by PT for possible SNF placement. This may be altered or completely changed at the discretion of the oncoming team pending results of further workup.   MDM Rules/Calculators/A&P                          Final Clinical Impression(s) / ED Diagnoses Final diagnoses:  None    Rx / DC Orders ED Discharge Orders     None        Sherrell Puller, PA-C 05/20/21 1952    Fredia Sorrow, MD 06/11/21 [phone]

## 2021-05-20 NOTE — Progress Notes (Signed)
CSW spoke with patient's neighbor Leah Payne [phone]) who states she is willing to watch the patient and assist her until her husband is discharged from the hospital. Leah Payne states she will be waiting for the patient upon her arrival home.   CSW spoke with Charge RN who states patient needs to be evaluated by PT to determine if she needs placement while her husband is hospitalized. Per RN, patient's husband stated that their neighbor cannot provide 24/7 supervision for the patient.  CSW will follow for discharge planning.  Edwin Dada, MSW, LCSW Transitions of Care  Clinical Social Worker II [phone]

## 2021-05-20 NOTE — ED Notes (Signed)
Grape juice offered to pt.

## 2021-05-20 NOTE — Progress Notes (Addendum)
CSW spoke with pt's husband Amalee Olsen , he stated concerns with pt' being by herself while, he is in the hospital. Pt's husband stated pt has a diagnosis of alzheimer. Pt's husband stated pt was very confused this morning, and did not know how to get to the bathroom. Pt's husband stated he did not know what to to so he called EMS. Pt's husband stated their neighbor Elizbeth Squires [phone]) can check in on pt if she is d/c home today. Pt is being followed by [facility] care management. CSW attempted to contact Reece Levy to get update on PCS services, no response left VM. At this time it is not know whether pt's husband will be admitted or not .  3:20pm CSW received call from Reece Levy , she stated helping the family set up [facility] services . She stated pt was to call [facility] to provided information to start services. She is not aware if this has been completed. CSW continue to follow.        Valentina Shaggy.Shantae Vantol, MSW, LCSWA [facility]  Transitions of Care Clinical Social Worker I Direct Dial: [phone]  Fax: [phone] [email or web address]

## 2021-05-20 NOTE — ED Notes (Signed)
Pts blood sugar is 82. Nurse notified.

## 2021-05-21 DIAGNOSIS — F0390 Unspecified dementia without behavioral disturbance: Secondary | ICD-10-CM | POA: Diagnosis not present

## 2021-05-21 NOTE — Care Management (Cosign Needed)
     RE: Leah Payne Date of Birth: 12-13-1945 Date: 05/21/2021  To Whom It May Concern:  Please be advised that the above-named patient has a primary diagnosis of dementia which supersedes any psychiatric diagnosis.     MD signature    Date

## 2021-05-21 NOTE — Discharge Instructions (Signed)
All of your tests are normal.  Your neighbors coming to pick you up and take you home.  Continue taking your usual medicines.  According to our social worker, your husband will will be coming home tomorrow.

## 2021-05-21 NOTE — [place] FL2 (Signed)
[place] MEDICAID FL2 LEVEL OF CARE SCREENING TOOL     IDENTIFICATION  Patient Name: Leah Payne Birthdate: 29-May-1946 Sex: female Admission Date (Current Location): 05/20/2021  [facility] and [place] Number:  [employer] and Address:  [facility],  [address], [place] [address]      Provider Number: [id number]  Attending Physician Name and Address:  Default, Provider, MD  Relative Name and Phone Number:  Jerl Santos (Spouse)   [phone] (Mobile    Current Level of Care: Hospital Recommended Level of Care: Skilled Nursing Facility Prior Approval Number:    Date Approved/Denied:   PASRR Number: pending  Discharge Plan: SNF    Current Diagnoses: 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   Candidal intertrigo 02/16/2018   History of fall 08/01/2017   Abdominal distension 05/16/2017   Vitamin D deficiency 03/21/2017   Medicare annual wellness visit, subsequent 09/23/2016   Health maintenance examination 09/23/2016   Advanced care planning/counseling discussion 09/23/2016   Dizziness 03/23/2016   Alzheimer disease (HCC) 12/17/2015   Imbalance 12/17/2015   WPW (Wolff-Parkinson-White syndrome)    Type 2 diabetes, controlled, 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HCC) 09/17/2015   Hypothyroidism 09/17/2015   Debility    Overactive bladder 08/29/2014   Seizure disorder (HCC) 08/04/2013   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HCC) 08/04/2013   Hypertension 08/04/2013   Hyperlipidemia associated with type 2 diabetes mellitus (HCC) 08/04/2013    Orientation RESPIRATION BLADDER Height & Weight     Self, Time, Situation, Place  Normal Continent Weight: 150 lb (68 kg) Height:  5\' 2"   (157.5 cm)  BEHAVIORAL SYMPTOMS/MOOD NEUROLOGICAL BOWEL NUTRITION STATUS      Continent Diet (regular)  AMBULATORY STATUS COMMUNICATION OF NEEDS Skin   Limited Assist Verbally Normal                       Personal Care Assistance Level of Assistance  Bathing, Feeding, Dressing Bathing Assistance: Independent Feeding assistance: Independent Dressing Assistance: Independent     Functional Limitations Info  Sight, Hearing, Speech Sight Info: Adequate Hearing Info: Adequate Speech Info: Adequate    SPECIAL CARE FACTORS FREQUENCY  PT (By licensed PT), OT (By licensed OT)     PT Frequency: 5x a week OT Frequency: 5x a week            Contractures Contractures Info: Not present    Additional Factors Info  Code Status, Allergies, Psychotropic Code Status Info: full Allergies Info: No known Allergies Psychotropic Info: sertraline (ZOLOFT) tablet 100 mg         Current Medications (05/21/2021):  This is the current hospital active medication list Current Facility-Administered Medications  Medication Dose Route Frequency Provider Last Rate Last Admin   levETIRAcetam (KEPPRA) tablet 500 mg  500 mg Oral BID 05/23/2021, PA-C   500 mg at 05/21/21 1234   levothyroxine (SYNTHROID) tablet 25 mcg  25 mcg Oral Q0600 05/23/21, RPH   25 mcg at 05/21/21 05/23/21   metFORMIN (GLUCOPHAGE) tablet 500 mg  500 mg Oral Q breakfast 8844, PA-C   500 mg at 05/21/21 1235   mirabegron ER ([name]) tablet 50 mg  50 mg Oral Daily 05/23/21, Achille Rich  50 mg at 05/21/21 1233   propranolol (INDERAL) tablet 10 mg  10 mg Oral BID Sherrell Puller, PA-C   10 mg at 05/21/21 1234   sertraline (ZOLOFT) tablet 100 mg  100 mg Oral Daily Einar Grad, RPH   100 mg at 05/21/21 1233   Current Outpatient Medications  Medication Sig Dispense Refill   atorvastatin (LIPITOR) 40 MG tablet Take 1 tablet (40 mg total) by mouth daily. 90 tablet 0   Cholecalciferol (VITAMIN D) 50 MCG (2000  UT) CAPS Take 1 capsule (2,000 Units total) by mouth daily. 30 capsule    donepezil (ARICEPT) 10 MG tablet Take 1 tablet (10 mg total) by mouth daily. 90 tablet 0   Fesoterodine Fumarate ([name] PO) Take 1 tablet by mouth daily.     levETIRAcetam (KEPPRA) 500 MG tablet TAKE ONE TABLET BY MOUTH TWICE DAILY 180 tablet 1   levothyroxine (SYNTHROID) 25 MCG tablet Take 1 tablet (25 mcg total) by mouth daily before breakfast. 90 tablet 0   loperamide (IMODIUM A-D) 2 MG capsule Take 1 capsule (2 mg total) by mouth as needed for diarrhea or loose stools. 30 capsule 3   metFORMIN (GLUCOPHAGE) 500 MG tablet TAKE 1 TABLET EVERY DAY WITH BREAKFAST 90 tablet 0   [name] 50 MG TB24 tablet TAKE ONE TABLET BY MOUTH EVERYDAY AT BEDTIME (Patient taking differently: Take 50 mg by mouth at bedtime.) 90 tablet 1   ondansetron (ZOFRAN) 4 MG tablet Take 1 tablet (4 mg total) by mouth every 8 (eight) hours as needed for nausea or vomiting. 20 tablet 0   propranolol (INDERAL) 10 MG tablet TAKE ONE TABLET BY MOUTH TWICE DAILY 180 tablet 1   sertraline (ZOLOFT) 100 MG tablet Take 1 tablet (100 mg total) by mouth daily. 90 tablet 2   Accu-Chek [employer] Lancets MISC Use as instructed to check blood sugar once daily 100 each 1   Alcohol Swabs (B-D SINGLE USE SWABS REGULAR) PADS Use as instructed to check blood sugar once daily 100 each 1   Blood Glucose Monitoring Suppl (ACCU-CHEK GUIDE) w/Device KIT 1 each by Does not apply route as directed. Use as instructed to check blood sugar once daily 1 kit 0   glucose blood (ACCU-CHEK GUIDE) test strip Use as instructed to check blood sugar once daily 100 each 1   loratadine (CLARITIN) 10 MG tablet Take 1 tablet (10 mg total) by mouth daily. (Patient not taking: No sig reported)       Discharge Medications: Please see discharge summary for a list of discharge medications.  Relevant Imaging Results:  Relevant Lab Results:   Additional Information SSN [id number]  Arlie Solomons Teddrick Mallari, LCSW

## 2021-05-21 NOTE — Progress Notes (Signed)
Pt has a history of dementia. CSW spoke with pt's husband, about PT recommendations for SNF rehab, he stated he would like for pt to be placed for short term, until he is out of the hospital.pt's husband stated [facility] at [facility] before but did not like it. Pt's husband requested pt not to be placed there.  CSW spoke with pt, pt stated she has never been before , but she would like to try it out.  CSW to work pt up for SNF.  Valentina Shaggy.Artelia Game, MSW, LCSWA  [facility]  Transitions of Care Clinical Social Worker I Direct Dial: [phone]  Fax: [phone] [email or web address]

## 2021-05-21 NOTE — ED Notes (Signed)
Pt consumed 100% of lunch tray.

## 2021-05-21 NOTE — Plan of Care (Addendum)
  Problem: Acute Rehab PT Goals(only PT should resolve) Goal: Pt Will Go Supine/Side To Sit Outcome: Progressing Flowsheets (Taken 05/21/2021 1153) Pt will go Supine/Side to Sit: with minimal assist Goal: Patient Will Transfer Sit To/From Stand Outcome: Progressing Flowsheets (Taken 05/21/2021 1153) Patient will transfer sit to/from stand: with minimal assist Goal: Pt Will Ambulate Outcome: Progressing Flowsheets (Taken 05/21/2021 1153) Pt will Ambulate:  10 feet  with rolling walker  with minimal assist  with moderate assist   Cassie Jones, SPT  During this treatment session, the therapist was present, participating in and directing the treatment.  12:32 PM, 05/21/21 [name], MPT Physical Therapist with [facility] [phone] office [phone] mobile phone

## 2021-05-21 NOTE — ED Provider Notes (Signed)
4:55 PM-received a call from social worker who is working with the patient.  Social work was contacted to ensure safe haven for the patient.  It has been determined that the patient's husband is going home tomorrow with home health services.  The patient has a neighbor who has previously helped her when her husband was in the hospital, and social worker states that this neighbor is able to help the patient tonight.  The neighbor will come and get the patient and take her home.  There is no indication for the patient to stay here any longer.  At this time the patient is alert and comfortable.  Vital signs are reassuring.  She has been eating.  Patient has been medically cleared.   Mancel Bale, MD 05/21/21 [id number]

## 2021-05-21 NOTE — Progress Notes (Signed)
CSW spoke with pt's husband Leah Payne, and explained to him of the challenges pt will have with getting short-term rehab. Pt is only in the ED due to her husband having concerns about her confusion. Pt's husband stated pt ambulates with a walker at baseline. Pt has been seen to be alert and oriented but sometimes confused. Pt's husband was admitted to the hospital two weeks ago, the pt was home alone with the neighbor checking in on her.  Pt's neighbor [facility] checks in on pt 5 to 6 times throughout the day and made sure she took her medication. Pt's husband verbally agreed pt can return home with her neighbor [facility]. Pt stated she is ok with going home. Pt's neighbor Corrie Dandy will pick pt up upon d/c.       Leah Payne.Leah Payne, MSW, LCSWA [facility]  Transitions of Care Clinical Social Worker I Direct Dial: [phone]  Fax: [phone] [email or web address]

## 2021-05-21 NOTE — Care Management (Cosign Needed)
                RE: Leah Payne Date of Birth: [birth year] Date:05/21/2021   To Whom It May Concern:  Please be advised that the above-named patient will require a short-term nursing home stay - anticipated 30 days or less for rehabilitation and strengthening.  The plan is for return home.     MD signature    Date

## 2021-05-21 NOTE — Evaluation (Addendum)
Physical Therapy Evaluation Patient Details Name: Leah Payne MRN: [id number] DOB: Jan 19, 1946 Today's Date: 05/21/2021  History of Present Illness  Leah Payne is a 75 y.o. female history of dementia presents to the emergency department via EMS for evaluation of confusion today.  The patient's husband reports that the patient reports she was not feeling well and stood up to walk with her walker to go to the bathroom when she sat down on the bed and forgot where the bathroom was. This concerned the husband to be called EMS.  Patient is alert and oriented to time, person, and place, but repeats herself often and is unsure why she is here because she "feels fine".   Clinical Impression  Patient in bed awake, alert, and agreeable for therapy. Patient demonstrated slow labored movement for transitioning from supine to sit at EOB, required Min/Mod assistance and verbal and tactile cuing. Patient demonstrated a slight posterior lean w/ sitting EOB and during ambulation. Patient took a few steps L, R, and forward at bedside using RW w/ verbal and visual cuing, requiring Min/Mod assist, limited mostly due to fatigue and generalized weakness. Patient will benefit from continued physical therapy in hospital and recommended venue below to increase strength, balance, endurance for safe ADLs and gait.      Recommendations for follow up therapy are one component of a multi-disciplinary discharge planning process, led by the attending physician.  Recommendations may be updated based on patient status, additional functional criteria and insurance authorization.  Follow Up Recommendations Skilled nursing-short term rehab (<3 hours/day)    Assistance Recommended at Discharge Frequent or constant Supervision/Assistance  Functional Status Assessment    Equipment Recommendations       Recommendations for Other Services       Precautions / Restrictions Precautions Precautions:  Fall Restrictions Weight Bearing Restrictions: No      Mobility  Bed Mobility Overal bed mobility: Needs Assistance Bed Mobility: Supine to Sit     Supine to sit: Mod assist;Min assist;HOB elevated     General bed mobility comments: slow labored movements, required verbal and tactile cues to sit EOB    Transfers Overall transfer level: Needs assistance Equipment used: Rolling walker (2 wheels) Transfers: Sit to/from Stand Sit to Stand: Min assist;Mod assist;From elevated surface           General transfer comment: Used RW, required cues for hand placement and required physical assist    Ambulation/Gait Ambulation/Gait assistance: Min assist;Mod assist Gait Distance (Feet): 4 Feet Assistive device: Rolling walker (2 wheels) Gait Pattern/deviations: Decreased step length - right;Decreased step length - left;Decreased stride length;Leaning posteriorly Gait velocity: Decreased   General Gait Details: slow cadence, required verabl and tactile cues  Stairs            Wheelchair Mobility    Modified Rankin (Stroke Patients Only)       Balance Overall balance assessment: Needs assistance Sitting-balance support: Bilateral upper extremity supported;Feet unsupported Sitting balance-Leahy Scale: Poor Sitting balance - Comments: fair/poor seated at EOB holding on to EOB w/ B UE Postural control: Posterior lean Standing balance support: Bilateral upper extremity supported;During functional activity;Reliant on assistive device for balance Standing balance-Leahy Scale: Fair Standing balance comment: fair using RW for short periods of time and short distances                             Pertinent Vitals/Pain Pain Assessment: No/denies pain    Home Living  Family/patient expects to be discharged to:: Private residence Living Arrangements: Spouse/significant other Available Help at Discharge: Family;Available PRN/intermittently;Neighbor (Husband is  currently in the hospital and neighbor is only able to check in a couple of times a day per ED notes) Type of Home: Apartment Home Access: Level entry       Home Layout: One level Home Equipment: Grab bars - tub/shower;Rolling Walker (2 wheels);Rollator (4 wheels) Additional Comments: Information per husband and hospital notes    Prior Function Prior Level of Function : Independent/Modified Independent;Needs assist;Patient poor historian/Family not available (patient reports using RW to ambulate household distances)             Mobility Comments: Does not drive, uses RW to ambulate household distances per patient report, patient poor historian, husband is unavailable ADLs Comments: assisted by spouse     Hand Dominance        Extremity/Trunk Assessment   Upper Extremity Assessment Upper Extremity Assessment: Overall WFL for tasks assessed    Lower Extremity Assessment Lower Extremity Assessment: Generalized weakness       Communication   Communication: No difficulties  Cognition Arousal/Alertness: Awake/alert Behavior During Therapy: WFL for tasks assessed/performed Overall Cognitive Status: History of cognitive impairments - at baseline                                 General Comments: Patient has dementia        General Comments      Exercises     Assessment/Plan    PT Assessment Patient needs continued PT services  PT Problem List Decreased strength;Decreased activity tolerance;Decreased balance;Decreased mobility;Decreased coordination;Decreased knowledge of use of DME;Decreased safety awareness;Decreased range of motion       PT Treatment Interventions DME instruction;Gait training;Stair training;Functional mobility training;Therapeutic activities;Therapeutic exercise;Balance training;Patient/family education    PT Goals (Current goals can be found in the Care Plan section)  Acute Rehab PT Goals Patient Stated Goal: Return home PT  Goal Formulation: With family Time For Goal Achievement: 06/05/21 Potential to Achieve Goals: Fair    Frequency Min 3X/week   Barriers to discharge        Co-evaluation               AM-PAC PT "6 Clicks" Mobility  Outcome Measure Help needed turning from your back to your side while in a flat bed without using bedrails?: A Little Help needed moving from lying on your back to sitting on the side of a flat bed without using bedrails?: A Lot Help needed moving to and from a bed to a chair (including a wheelchair)?: A Little Help needed standing up from a chair using your arms (e.g., wheelchair or bedside chair)?: A Little Help needed to walk in hospital room?: A Little Help needed climbing 3-5 steps with a railing? : A Lot 6 Click Score: 16    End of Session Equipment Utilized During Treatment: Gait belt Activity Tolerance: Patient tolerated treatment well;Patient limited by fatigue Patient left: in bed;with call bell/phone within reach (tray table over bed, nurse notified) Nurse Communication: Mobility status PT Visit Diagnosis: Unsteadiness on feet (R26.81);Other abnormalities of gait and mobility (R26.89);Muscle weakness (generalized) (M62.81)    Time: 9381-0175 PT Time Calculation (min) (ACUTE ONLY): 22 min   Charges:   PT Evaluation $PT Eval Moderate Complexity: 1 Mod PT Treatments $Therapeutic Activity: 8-22 mins        Cassie Jones, SPT  During this  treatment session, the therapist was present, participating in and directing the treatment.  12:27 PM, 05/21/21 [name], MPT Physical Therapist with [facility] [phone] office [phone] mobile phone

## 2021-05-24 DIAGNOSIS — G309 Alzheimer's disease, unspecified: Secondary | ICD-10-CM | POA: Diagnosis not present

## 2021-05-24 DIAGNOSIS — E785 Hyperlipidemia, unspecified: Secondary | ICD-10-CM

## 2021-05-24 DIAGNOSIS — F0280 Dementia in other diseases classified elsewhere without behavioral disturbance: Secondary | ICD-10-CM | POA: Diagnosis not present

## 2021-05-24 DIAGNOSIS — E1169 Type 2 diabetes mellitus with other specified complication: Secondary | ICD-10-CM

## 2021-05-25 ENCOUNTER — Telehealth: Payer: Self-pay

## 2021-05-25 NOTE — Chronic Care Management (AMB) (Addendum)
Chronic Care Management Pharmacy Assistant   Name: Leah Payne  MRN: [id number] DOB: 1945-09-02   Reason for Encounter: Medication Adherence and Delivery Coordination    Recent office visits:  05/18/21- Family Medicine- CCM RN telephone call for case management follow up  Recent consult visits:  None since last CCM contact  Hospital visits:  05/20/21 thru 05/21/21 Leah Payne-Patient presented with  confusion and dizziness.Labs ordered ( UA abnormal) Negative for Covid-19, 32 hour observation, discharged to home. No medication changes  Medications: Outpatient Encounter Medications as of 05/25/2021  Medication Sig Note   Accu-Chek FastClix Lancets MISC Use as instructed to check blood sugar once daily    Alcohol Swabs (B-D SINGLE USE SWABS REGULAR) PADS Use as instructed to check blood sugar once daily    atorvastatin (LIPITOR) 40 MG tablet Take 1 tablet (40 mg total) by mouth daily.    Blood Glucose Monitoring Suppl (ACCU-CHEK GUIDE) w/Device KIT 1 each by Does not apply route as directed. Use as instructed to check blood sugar once daily    Cholecalciferol (VITAMIN D) 50 MCG (2000 UT) CAPS Take 1 capsule (2,000 Units total) by mouth daily.    donepezil (ARICEPT) 10 MG tablet Take 1 tablet (10 mg total) by mouth daily.    Fesoterodine Fumarate ([name] PO) Take 1 tablet by mouth daily. 05/20/2021: This medication has been D/C by doctor husband gave her a tablet of the D/C medication last night   glucose blood (ACCU-CHEK GUIDE) test strip Use as instructed to check blood sugar once daily    levETIRAcetam (KEPPRA) 500 MG tablet TAKE ONE TABLET BY MOUTH TWICE DAILY    levothyroxine (SYNTHROID) 25 MCG tablet Take 1 tablet (25 mcg total) by mouth daily before breakfast.    loperamide (IMODIUM A-D) 2 MG capsule Take 1 capsule (2 mg total) by mouth as needed for diarrhea or loose stools.    loratadine (CLARITIN) 10 MG tablet Take 1 tablet (10 mg total) by mouth daily. (Patient not  taking: No sig reported)    metFORMIN (GLUCOPHAGE) 500 MG tablet TAKE 1 TABLET EVERY DAY WITH BREAKFAST    [name] 50 MG TB24 tablet TAKE ONE TABLET BY MOUTH EVERYDAY AT BEDTIME (Patient taking differently: Take 50 mg by mouth at bedtime.)    ondansetron (ZOFRAN) 4 MG tablet Take 1 tablet (4 mg total) by mouth every 8 (eight) hours as needed for nausea or vomiting.    propranolol (INDERAL) 10 MG tablet TAKE ONE TABLET BY MOUTH TWICE DAILY    sertraline (ZOLOFT) 100 MG tablet Take 1 tablet (100 mg total) by mouth daily.    No facility-administered encounter medications on file as of 05/25/2021.   BP Readings from Last 3 Encounters:  05/21/21 (!) 148/89  11/16/20 112/72  11/15/20 (!) 134/56    Lab Results  Component Value Date   HGBA1C 6.0 (A) 10/21/2020      Recent OV, Consult or Hospital visit:  No medication changes indicated   Last adherence delivery date:05/04/21      Patient is due for next adherence delivery on: 06/02/21  Spoke with patient on 05/25/21 reviewed medications and coordinated delivery.  This delivery to include: Adherence Packaging  30 Days  Packs: Atorvastatin 40 mg- 1 tablet evening meal Donepezil 10 mg - 1 tablet breakfast Levetiracetam 500 mg - 1 tablet breakfast 1 tablet evening meal Loratadine 23m - 1 tablet evening meal  Levothyroxine 25 mcg - 1 tablet before breakfast Metformin 500 mg- 1 tablet breakfast  [name] 50 mg - 1 tablet bedtime Propranolol 10 mg - 1 tablet breakfast 1 tablet evening meal Sertraline 100 mg - 1 tablet breakfast Vitamin D3 2000 units - 1 capsule breakfast  VIAL medications: None  Patient declined the following medications this month: None  Any concerns about your medications? No  How often do you forget or accidentally miss a dose? Rarely   Is patient in packaging Yes  What is the date on your next pill pack? Patient unable to get to the box.  Any concerns or issues with your packaging? No concerns at this  time   No refill request needed.  Confirmed delivery date of 06/02/21, advised patient that pharmacy will contact them the morning of delivery.  Annual wellness visit in last year? No Most Recent BP reading: 112/72  90-P 11/16/20  If Diabetic: Most recent A1C reading: 6.0  10/21/20 Last eye exam / retinopathy screening: 2018 Last diabetic foot exam: 2019  Debbora Dus, CPP notified  Avel Sensor, [place] Assistant [phone]  I have reviewed the care management and care coordination activities outlined in this encounter and I am certifying that I agree with the content of this note. No further action required.  Debbora Dus, PharmD Clinical Pharmacist [facility] [phone]

## 2021-05-26 ENCOUNTER — Ambulatory Visit (INDEPENDENT_AMBULATORY_CARE_PROVIDER_SITE_OTHER): Payer: Medicare Other | Admitting: *Deleted

## 2021-05-26 DIAGNOSIS — Z9181 History of falling: Secondary | ICD-10-CM

## 2021-05-26 DIAGNOSIS — F0280 Dementia in other diseases classified elsewhere without behavioral disturbance: Secondary | ICD-10-CM

## 2021-05-26 DIAGNOSIS — G309 Alzheimer's disease, unspecified: Secondary | ICD-10-CM

## 2021-05-26 DIAGNOSIS — E1142 Type 2 diabetes mellitus with diabetic polyneuropathy: Secondary | ICD-10-CM

## 2021-05-28 NOTE — Chronic Care Management (AMB) (Signed)
Chronic Care Management    Clinical Social Work Note  05/28/2021 Name: Leah Payne MRN: [id number] DOB: 1946/05/23  Leah Payne is a 75 y.o. year old female who is a primary care patient of Tommie Sams, DO. The CCM team was consulted to assist the patient with chronic disease management and/or care coordination needs related to: [employer] .   Engaged with patient by telephone for follow up visit in response to provider referral for social work chronic care management and care coordination services.   Consent to Services:  The patient was given the following information about Chronic Care Management services today, agreed to services, and gave verbal consent: 1. CCM service includes personalized support from designated clinical staff supervised by the primary care provider, including individualized plan of care and coordination with other care providers 2. 24/7 contact phone numbers for assistance for urgent and routine care needs. 3. Service will only be billed when office clinical staff spend 20 minutes or more in a month to coordinate care. 4. Only one practitioner may furnish and bill the service in a calendar month. 5.The patient may stop CCM services at any time (effective at the end of the month) by phone call to the office staff. 6. The patient will be responsible for cost sharing (co-pay) of up to 20% of the service fee (after annual deductible is met). Patient agreed to services and consent obtained.  Patient agreed to services and consent obtained.   Assessment: Review of patient past medical history, allergies, medications, and health status, including review of relevant consultants reports was performed today as part of a comprehensive evaluation and provision of chronic care management and care coordination services.     SDOH (Social Determinants of Health) assessments and interventions performed:    Advanced Directives Status: See Care Plan for related  entries.  CCM Care Plan  No Known Allergies  Outpatient Encounter Medications as of 05/26/2021  Medication Sig Note   Accu-Chek FastClix Lancets MISC Use as instructed to check blood sugar once daily    Alcohol Swabs (B-D SINGLE USE SWABS REGULAR) PADS Use as instructed to check blood sugar once daily    atorvastatin (LIPITOR) 40 MG tablet Take 1 tablet (40 mg total) by mouth daily.    Blood Glucose Monitoring Suppl (ACCU-CHEK GUIDE) w/Device KIT 1 each by Does not apply route as directed. Use as instructed to check blood sugar once daily    Cholecalciferol (VITAMIN D) 50 MCG (2000 UT) CAPS Take 1 capsule (2,000 Units total) by mouth daily.    donepezil (ARICEPT) 10 MG tablet Take 1 tablet (10 mg total) by mouth daily.    Fesoterodine Fumarate ([name] PO) Take 1 tablet by mouth daily. 05/20/2021: This medication has been D/C by doctor husband gave her a tablet of the D/C medication last night   glucose blood (ACCU-CHEK GUIDE) test strip Use as instructed to check blood sugar once daily    levETIRAcetam (KEPPRA) 500 MG tablet TAKE ONE TABLET BY MOUTH TWICE DAILY    levothyroxine (SYNTHROID) 25 MCG tablet Take 1 tablet (25 mcg total) by mouth daily before breakfast.    loperamide (IMODIUM A-D) 2 MG capsule Take 1 capsule (2 mg total) by mouth as needed for diarrhea or loose stools.    loratadine (CLARITIN) 10 MG tablet Take 1 tablet (10 mg total) by mouth daily. (Patient not taking: No sig reported)    metFORMIN (GLUCOPHAGE) 500 MG tablet TAKE 1 TABLET EVERY DAY WITH BREAKFAST  [name] 50 MG TB24 tablet TAKE ONE TABLET BY MOUTH EVERYDAY AT BEDTIME (Patient taking differently: Take 50 mg by mouth at bedtime.)    ondansetron (ZOFRAN) 4 MG tablet Take 1 tablet (4 mg total) by mouth every 8 (eight) hours as needed for nausea or vomiting.    propranolol (INDERAL) 10 MG tablet TAKE ONE TABLET BY MOUTH TWICE DAILY    sertraline (ZOLOFT) 100 MG tablet Take 1 tablet (100 mg total) by mouth daily.     No facility-administered encounter medications on file as of 05/26/2021.    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   Candidal intertrigo 02/16/2018   History of fall 08/01/2017   Abdominal distension 05/16/2017   Vitamin D deficiency 03/21/2017   Medicare annual wellness visit, subsequent 09/23/2016   Health maintenance examination 09/23/2016   Advanced care planning/counseling discussion 09/23/2016   Dizziness 03/23/2016   Alzheimer disease ([place]) 12/17/2015   Imbalance 12/17/2015   WPW (Wolff-Parkinson-White syndrome)    Type 2 diabetes, controlled, 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    Follow Up Plan: SW will follow up with patient by phone over the next week      Eduard Clos MSW, [place] Licensed Clinical Social Worker [place] [phone]

## 2021-05-28 NOTE — Patient Instructions (Signed)
Visit Information  The patient verbalized understanding of instructions, educational materials, and care plan provided today and declined offer to receive copy of patient instructions, educational materials, and care plan.   Telephone follow up appointment with care management team member scheduled for:06/02/21  Reece Levy MSW, LCSW Licensed Clinical Social Worker [facility] [place] [phone]

## 2021-06-01 ENCOUNTER — Telehealth: Payer: Medicare Other

## 2021-06-01 ENCOUNTER — Telehealth: Payer: Self-pay | Admitting: *Deleted

## 2021-06-01 NOTE — Chronic Care Management (AMB) (Signed)
  Care Management   Note  06/01/2021 Name: [name] MRN: [id number] DOB: 12/14/1945  Leah Payne is a 75 y.o. year old female who is a primary care patient of Tommie Sams, DO and is actively engaged with the care management team. I reached out to Leah Payne by phone today to assist with canceling call with PharmD.  Your patient is eligible for CCM services and the embedded care coordination team assigned to your practice and patients is available to provide assistance. However, a referral order is required for initiation of CCM services. Your assistance with placement of an order utilizing the [id number] referral order, selecting "Embedded Chronic Care Management Services", requesting team member (Pharmacist, Nurse, Social Worker), and comment about specific need is appreciated.   Thank you,     Gwenevere Ghazi  Care Guide, Embedded Care Coordination [facility] Management  Direct Dial: [phone]

## 2021-06-02 ENCOUNTER — Telehealth: Payer: Medicare Other | Admitting: *Deleted

## 2021-06-04 ENCOUNTER — Ambulatory Visit: Payer: Medicare Other

## 2021-06-04 DIAGNOSIS — Z9181 History of falling: Secondary | ICD-10-CM

## 2021-06-04 DIAGNOSIS — F0280 Dementia in other diseases classified elsewhere without behavioral disturbance: Secondary | ICD-10-CM

## 2021-06-04 NOTE — Chronic Care Management (AMB) (Signed)
Chronic Care Management   CCM RN Visit Note  06/04/2021 Name: Leah Payne MRN: [id number] DOB: March 22, 1946  Subjective: Leah Payne is a 75 y.o. year old female who is a primary care patient of [name], DO. The care management team was consulted for assistance with disease management and care coordination needs.    Engaged with patient by telephone for follow up visit in response to provider referral for case management and/or care coordination services.   Consent to Services:  The patient was given information about Chronic Care Management services, agreed to services, and gave verbal consent prior to initiation of services.  Please see initial visit note for detailed documentation.   Patient agreed to services and verbal consent obtained.   Assessment: Review of patient past medical history, allergies, medications, health status, including review of consultants reports, laboratory and other test data, was performed as part of comprehensive evaluation and provision of chronic care management services.   SDOH (Social Determinants of Health) assessments and interventions performed:    CCM Care Plan  No Known Allergies  Outpatient Encounter Medications as of 06/04/2021  Medication Sig Note   Accu-Chek FastClix Lancets MISC Use as instructed to check blood sugar once daily    Alcohol Swabs (B-D SINGLE USE SWABS REGULAR) PADS Use as instructed to check blood sugar once daily    atorvastatin (LIPITOR) 40 MG tablet Take 1 tablet (40 mg total) by mouth daily.    Blood Glucose Monitoring Suppl (ACCU-CHEK GUIDE) w/Device KIT 1 each by Does not apply route as directed. Use as instructed to check blood sugar once daily    Cholecalciferol (VITAMIN D) 50 MCG (2000 UT) CAPS Take 1 capsule (2,000 Units total) by mouth daily.    donepezil (ARICEPT) 10 MG tablet Take 1 tablet (10 mg total) by mouth daily.    Fesoterodine Fumarate ([name] PO) Take 1 tablet by mouth daily. 05/20/2021:  This medication has been D/C by doctor husband gave her a tablet of the D/C medication last night   glucose blood (ACCU-CHEK GUIDE) test strip Use as instructed to check blood sugar once daily    levETIRAcetam (KEPPRA) 500 MG tablet TAKE ONE TABLET BY MOUTH TWICE DAILY    levothyroxine (SYNTHROID) 25 MCG tablet Take 1 tablet (25 mcg total) by mouth daily before breakfast.    loperamide (IMODIUM A-D) 2 MG capsule Take 1 capsule (2 mg total) by mouth as needed for diarrhea or loose stools.    loratadine (CLARITIN) 10 MG tablet Take 1 tablet (10 mg total) by mouth daily. (Patient not taking: No sig reported)    metFORMIN (GLUCOPHAGE) 500 MG tablet TAKE 1 TABLET EVERY DAY WITH BREAKFAST    [name] 50 MG TB24 tablet TAKE ONE TABLET BY MOUTH EVERYDAY AT BEDTIME (Patient taking differently: Take 50 mg by mouth at bedtime.)    ondansetron (ZOFRAN) 4 MG tablet Take 1 tablet (4 mg total) by mouth every 8 (eight) hours as needed for nausea or vomiting.    propranolol (INDERAL) 10 MG tablet TAKE ONE TABLET BY MOUTH TWICE DAILY    sertraline (ZOLOFT) 100 MG tablet Take 1 tablet (100 mg total) by mouth daily.    No facility-administered encounter medications on file as of 06/04/2021.    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   Candidal intertrigo 02/16/2018   History of fall 08/01/2017   Abdominal distension 05/16/2017   Vitamin D deficiency 03/21/2017   Medicare annual wellness visit, subsequent 09/23/2016   Health maintenance examination 09/23/2016   Advanced care planning/counseling discussion 09/23/2016   Dizziness 03/23/2016   Alzheimer disease ([place]) 12/17/2015   Imbalance 12/17/2015   WPW (Wolff-Parkinson-White syndrome)     Type 2 diabetes, controlled, 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[place]) 09/17/2015   Hypothyroidism 09/17/2015   Debility    Overactive bladder 08/29/2014   Seizure disorder ([place]) 08/04/2013   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/2013   Hypertension 08/04/2013   Hyperlipidemia associated with type 2 diabetes mellitus ([place]) 08/04/2013    Conditions to be addressed/monitored:Dementia and Falls  Care Plan : Dementia (Adult)  Updates made by Dannielle Karvonen, RN since 06/04/2021 12:00 AM     Problem: Caregiver Stress   Priority: High     Long-Range Goal: Caregiver Coping Optimized   Start Date: 12/11/2020  Expected End Date: 06/23/2021  This Visit's Progress: On track  Recent Progress: On track  Priority: High  Note:   Current Barriers: Spouse reports he has been in the hospital x2 and had 2 ED visits within the last 6 weeks.  He states his neighbor,  Lowell Guitar continues to check on patient at home when he is admitted to the hospital . Spouse states he continues to work with the Education officer, museum regarding personal care services for patient. Spouse states he is waiting for return call from agency regarding start of services.  Caregiver Role strain Caregiver with chronic health problems  Patient not able to independently care for herself Clinical Goal(s):  Collaboration with [name], MD regarding development and update of comprehensive plan of care as evidenced by provider attestation and co-signature Inter-disciplinary care team collaboration (see longitudinal plan of care) Caregiver will work with care management team to address care coordination and chronic disease management needs related to Disease Management, educational Needs, care coordination, caregiver stress support, dementia and caregiver support.  Interventions:  Evaluation of current treatment plan related to Dementia, Lacks knowledge of community resources, patient health management and adherence to plan as established  by provider. Collaboration with [name], MD regarding development and update of comprehensive plan of care as evidenced by provider attestation       and co-signature Inter-disciplinary care team collaboration (see longitudinal plan of care):  Patient is scheduled to see her new primary care provider, Dr. Elmore Guise on 06/10/2021. She will be assigned new embedded case manager, Jacqlyn Larsen at that time.  Assessed caregivers ongoing ability to care for patient due to own chronic health conditions.  Assessed caregivers knowledge and ability to provide patient care, including bathing, skin care, safety, nutrition, medications, and ambulation.  Allowed caregiver time to discuss problems, concerns, and feelings.  Discussed plans with cargiver for ongoing care management follow up and provided caregiver with direct contact information for care management team Patient/ Caregiver Goals: - Continue to try and identify available family/ friends who can assist with caregiving - use suggested community resources as appropriate -  Continue to set aside time for self if/ when able.  Identify those things that bring peace and relaxation - continue to work with Education officer, museum regarding personal care services for patient.  - You will have a new case manager, Jacqlyn Larsen for ongoing follow up.  Follow Up Plan: The patient has been  provided with contact information for the care management team and has been advised to call with any health related questions or concerns.  The care management team will reach out to the patient again over the next 1-2 months.           Problem: High risk for falls   Priority: High     Long-Range Goal: Patient and/ or caregiver will implement strategies to increase safety and prevent falls in the home.   Start Date: 12/11/2020  Expected End Date: 06/23/2021  This Visit's Progress: On track  Recent Progress: On track  Priority: High  Note:   Current Barriers: Per  chart review patient in the ED on 05/20/2021 for confusion.  Patient  evaluated and sent home in neighbors care. Patients spouse was admitted to the hospital.  Spouse  denies patient having any falls since last outreach with RNCM.  He states, " she is about the same."  Spouse reports he continues to work with Education officer, museum regarding personal care services for patient. He states he is waiting for a return call from agency regarding start of services.  Knowledge Deficits related to fall precautions in patient with dementia  Unable to self administer medications as prescribed Unable to perform IADLs independently Cognitive Deficits Clinical Goal(s):  patient will demonstrate improved adherence to prescribed treatment plan for decreasing falls as evidenced by patient utilizing walker when ambulating  Caregiver will verbalize using fall risk reduction strategies as discussed patient will minimize falls patient will attend scheduled medical appointments Patient scheduled to see new primary care provider on 06/10/2021.  Caregiver will verbalize basic understanding of dementia disease process and  patient health management plan as evidenced by implementing fall risk precautions, assisting patient with attending provider follow up visit, taking medications as prescribed, and Long term care planning.  The caregiver will continue to work with the care management team toward's patients health management plan.  Interventions:  Collaboration with [name], MD regarding development and update of comprehensive plan of care as evidenced by provider attestation and co-signature Inter-disciplinary care team collaboration (see longitudinal plan of care):  RNCM informed spouse that patient will transition to new embedded case manager, Jacqlyn Larsen for ongoing follow up due to having new primary care provider.  Fall prevention re-discussed with spouse Medications reviewed Assessed for falls since last  encounter. Discussed tips for everyday care of patients with dementia: keep a routine, such as bathing dressing eating at the same time each day, plan activities that the person enjoys and try to do them at the same time each day, serve meals in a consistent, familiar place and give patient enough time to eat, encourage use of loose-fitting, comfortable, easy to use clothing such as, elastic waistbands, fabric fasteners, or large zippers pulls instead of shoelaces, buttons, or buckles.  Assessed caregiver's knowledge of fall risk prevention secondary to previously provided education. Reviewed scheduled/upcoming provider appointments  Patient Goals:  -tips for everyday care of patients with dementia: keep a routine, such as bathing dressing eating at the same time each day, plan activities that the person enjoys and try to do them at the same time each day, serve meals in a consistent, familiar place and give patient enough time to eat, encourage use of loose-fitting, comfortable, easy to use clothing such as, elastic waistbands, fabric fasteners, or large zippers pulls instead of shoelaces, buttons, or buckles.  - Continue with reminders to patient regarding the use of her walker.   - De-clutter walkways -  Change positions slowly - Always wear secure fitting shoes at all times with ambulation - Utilize home lighting for dim lit areas - Keep items used often in easy to reach places - Continue to try and identify available family/ friends who can assist with caregiving - use suggested community resources as appropriate - Set aside time for self if/ when able.  Identify those things that bring peace and relaxation - Continue to work with Education officer, museum regarding personal care services. - You will have a new case manager, Jacqlyn Larsen.   You will be contacted by the care guide to schedule a telephone appointment with new case manager.  Follow Up Plan: The patient has been provided with contact  information for the care management team and has been advised to call with any health related questions or concerns.  The care management team will reach out to the patient again over the next 1-2 months.            Plan:The patient has been provided with contact information for the care management team and has been advised to call with any health related questions or concerns.  The care management team will reach out to the patient again over the next 1-2 months. Quinn Plowman RN,BSN,CCM RN Case Manager [place]  [phone]

## 2021-06-04 NOTE — Patient Instructions (Signed)
Visit Information:  Thank you for taking the time to speak with me today.  You will be contacted by the Care guide to schedule a telephone appointment with your new case manager, Irving Shows.   Fall/ Dementia Goals:  -tips for everyday care of patients with dementia: keep a routine, such as bathing dressing eating at the same time each day, plan activities that the person enjoys and try to do them at the same time each day, serve meals in a consistent, familiar place and give patient enough time to eat, encourage use of loose-fitting, comfortable, easy to use clothing such as, elastic waistbands, fabric fasteners, or large zippers pulls instead of shoelaces, buttons, or buckles.  - Continue with reminders to patient regarding the use of her walker.   - De-clutter walkways - Change positions slowly - Always wear secure fitting shoes at all times with ambulation - Utilize home lighting for dim lit areas - Keep items used often in easy to reach places - Continue to try and identify available family/ friends who can assist with caregiving - use suggested community resources as appropriate - Set aside time for self if/ when able.  Identify those things that bring peace and relaxation - Continue to work with Child psychotherapist regarding personal care services. - You will have a new case manager, Irving Shows.   You will be contacted by the care guide to schedule a telephone appointment with new case manager  Caregiver coping goals: - Continue to try and identify available family/ friends who can assist with caregiving - use suggested community resources as appropriate -  Continue to set aside time for self if/ when able.  Identify those things that bring peace and relaxation - continue to work with Child psychotherapist regarding personal care services for patient.  - You will have a new case manager, Irving Shows for ongoing follow up.   The patient verbalized understanding of instructions, educational  materials, and care plan provided today and agreed to receive a mailed copy of patient instructions, educational materials, and care plan.   The patient has been provided with contact information for the care management team and has been advised to call with any health related questions or concerns.  The care management team will reach out to the patient again over the next 1-2 months .   George Ina RN,BSN,CCM RN Case Manager [facility]  [phone]

## 2021-06-09 ENCOUNTER — Other Ambulatory Visit: Payer: Self-pay | Admitting: Family Medicine

## 2021-06-10 ENCOUNTER — Other Ambulatory Visit: Payer: Self-pay

## 2021-06-10 ENCOUNTER — Encounter: Payer: Self-pay | Admitting: Family Medicine

## 2021-06-10 ENCOUNTER — Ambulatory Visit (INDEPENDENT_AMBULATORY_CARE_PROVIDER_SITE_OTHER): Payer: Medicare Other | Admitting: Family Medicine

## 2021-06-10 VITALS — BP 135/84 | HR 69 | Ht 59.5 in

## 2021-06-10 DIAGNOSIS — I10 Essential (primary) hypertension: Secondary | ICD-10-CM | POA: Diagnosis not present

## 2021-06-10 DIAGNOSIS — E039 Hypothyroidism, unspecified: Secondary | ICD-10-CM

## 2021-06-10 DIAGNOSIS — E1169 Type 2 diabetes mellitus with other specified complication: Secondary | ICD-10-CM | POA: Diagnosis not present

## 2021-06-10 DIAGNOSIS — G309 Alzheimer's disease, unspecified: Secondary | ICD-10-CM | POA: Diagnosis not present

## 2021-06-10 DIAGNOSIS — E785 Hyperlipidemia, unspecified: Secondary | ICD-10-CM

## 2021-06-10 DIAGNOSIS — E1142 Type 2 diabetes mellitus with diabetic polyneuropathy: Secondary | ICD-10-CM

## 2021-06-10 DIAGNOSIS — F0280 Dementia in other diseases classified elsewhere without behavioral disturbance: Secondary | ICD-10-CM

## 2021-06-10 NOTE — Assessment & Plan Note (Signed)
Stable. Continue Metformin. 

## 2021-06-10 NOTE — Assessment & Plan Note (Signed)
TSH today 

## 2021-06-10 NOTE — Progress Notes (Signed)
Subjective:  Patient ID: Leah Payne, female    DOB: [birth year]  Age: 75 y.o. MRN: [id number]  CC: Establish care.  HPI Leah Payne is a 75 y.o. female presents to the clinic today to establish care. Current issues are discussed below.  Dementia Stable. Is on Aricept. Needs assistance with transfers and needs help with ambulation. Husband prepares meals. She is independent in most ADL's.  DM-2 Well controlled. Is currently on once daily Metformin 500 mg.  Last A1C was 6.0 (10/21/20).  Hypothyroidism Last TSH was in July of 2021. Currently on 25 mcg of Synthroid.  Hypertension BP well controlled at this time (for age). Is on Propranolol (unsure exactly why she is on this and not another agent).   Hyperlipidemia Well controlled per most recent labs. Currently on Lipitor. Needs lipid panel.    PMH, Surgical Hx, Family Hx, Social History reviewed and updated as below.  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) - saw Dr Gerlene Fee - no benefit from shunt placement    Hyperlipidemia 08/04/2013   Preaxial polydactyly of right hand congenital   Seizure disorder (HCC) 08/04/2013   WPW (Wolff-Parkinson-White syndrome)    has seen Dr Donnie Aho   Past Surgical History:  Procedure Laterality Date   CHOLECYSTECTOMY  1978   Family History  Problem Relation Age of Onset   Stroke Mother 8   Pneumonia Mother    CAD Father 58   Alzheimer's disease Father    Pneumonia Father    Cancer Maternal Aunt        lung (non-smoker)   Diabetes Neg Hx    Social History   Tobacco Use   Smoking status: Never   Smokeless tobacco: Never  Substance  Use Topics   Alcohol use: No    Alcohol/week: 0.0 standard drinks    Review of Systems  Respiratory: Negative.    Cardiovascular: Negative.   Gastrointestinal: Negative.   Neurological:        Memory problems (dementia).   Objective:   Today's Vitals: BP 135/84   Pulse 69   Ht 4' 11.5" (1.511 m)   SpO2 99%   BMI 29.79 kg/m   Physical Exam Constitutional:      General: She is not in acute distress. HENT:     Head: Atraumatic.     Comments: Frontal bossing. Eyes:     General:        Right eye: No discharge.        Left eye: No discharge.     Conjunctiva/sclera: Conjunctivae normal.  Cardiovascular:     Rate and Rhythm: Normal rate and regular rhythm.  Pulmonary:     Effort: Pulmonary effort is normal.     Breath sounds: Normal breath sounds. No wheezing or rales.  Abdominal:     General: There is no distension.     Palpations: Abdomen is soft.  Neurological:     Mental Status: She is alert.     Assessment & Plan:   Problem List Items Addressed This Visit       Cardiovascular and Mediastinum   Essential hypertension    Stable. Will consider discontinuation of propranolol  at follow up.        Endocrine   Type 2 diabetes, controlled, 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HCC)    Stable.  Continue Metformin.      Hypothyroidism    TSH today.      Relevant Orders   TSH   Hyperlipidemia associated with type 2 diabetes mellitus (HCC)    Has been well controlled.  Lipid panel ordered. Continue lipitor.      Relevant Orders   Lipid panel     Nervous and Auditory   Alzheimer disease (HCC) - Primary    Seems stable at this time. Continue Aricept. Has home health involved as well as case manager, etc.        Follow-up: Return in about 3 months (around 09/10/2021).  Everlene Other DO [facility]

## 2021-06-10 NOTE — Assessment & Plan Note (Signed)
Seems stable at this time. Continue Aricept. Has home health involved as well as case Production designer, theatre/television/film, etc.

## 2021-06-10 NOTE — Assessment & Plan Note (Signed)
Has been well controlled.  Lipid panel ordered. Continue lipitor.

## 2021-06-10 NOTE — Patient Instructions (Signed)
Labs today.  Follow up in 3 months.  Take care  Dr. Kayloni Rocco  

## 2021-06-10 NOTE — Assessment & Plan Note (Signed)
Stable. Will consider discontinuation of propranolol at follow up.

## 2021-06-11 LAB — LIPID PANEL
Chol/HDL Ratio: 3.2 ratio (ref 0.0–4.4)
Cholesterol, Total: 154 mg/dL (ref 100–199)
HDL: 48 mg/dL (ref >39)
LDL Chol Calc (NIH): 83 mg/dL (ref 0–99)
Triglycerides: 127 mg/dL (ref 0–149)
VLDL Cholesterol Cal: 23 mg/dL (ref 5–40)

## 2021-06-11 LAB — TSH: TSH: 2.61 u[IU]/mL (ref 0.450–4.500)

## 2021-06-23 DIAGNOSIS — F0280 Dementia in other diseases classified elsewhere without behavioral disturbance: Secondary | ICD-10-CM | POA: Diagnosis not present

## 2021-06-28 ENCOUNTER — Other Ambulatory Visit: Payer: Self-pay | Admitting: Family Medicine

## 2021-06-28 ENCOUNTER — Telehealth: Payer: Medicare Other

## 2021-07-06 ENCOUNTER — Ambulatory Visit (INDEPENDENT_AMBULATORY_CARE_PROVIDER_SITE_OTHER): Payer: Medicare Other

## 2021-07-06 ENCOUNTER — Other Ambulatory Visit: Payer: Self-pay

## 2021-07-06 ENCOUNTER — Telehealth: Payer: Self-pay

## 2021-07-06 VITALS — Ht 60.0 in | Wt 145.0 lb

## 2021-07-06 DIAGNOSIS — Z0000 Encounter for general adult medical examination without abnormal findings: Secondary | ICD-10-CM | POA: Diagnosis not present

## 2021-07-06 NOTE — Patient Instructions (Signed)
Leah Payne , Thank you for taking time to come for your Medicare Wellness Visit. I appreciate your ongoing commitment to your health goals. Please review the following plan we discussed and let me know if I can assist you in the future.   Screening recommendations/referrals: Colonoscopy: No longer required.  Mammogram: No longer required. Bone Density: Declined due to mobility issues.  Recommended yearly ophthalmology/optometry visit for glaucoma screening and checkup Recommended yearly dental visit for hygiene and checkup  Vaccinations: Influenza vaccine: Due. Repeat annually  Pneumococcal vaccine: Done 03/16/2015 and 08/25/2016 Tdap vaccine: Due Repeat in 10 years  Shingles vaccine: Shingrix discussed. Please contact your pharmacy for coverage information.     Covid-19:Done 06/03/2020  Advanced directives: Copy in chart.   Conditions/risks identified: Drink 6-8 glasses of water and eat lots of fruits and vegetables.   Next appointment: Follow up in one year for your annual wellness visit 2023.   Preventive Care 22 Years and Older, Female Preventive care refers to lifestyle choices and visits with your health care provider that can promote health and wellness. What does preventive care include? A yearly physical exam. This is also called an annual well check. Dental exams once or twice a year. Routine eye exams. Ask your health care provider how often you should have your eyes checked. Personal lifestyle choices, including: Daily care of your teeth and gums. Regular physical activity. Eating a healthy diet. Avoiding tobacco and drug use. Limiting alcohol use. Practicing safe sex. Taking low-dose aspirin every day. Taking vitamin and mineral supplements as recommended by your health care provider. What happens during an annual well check? The services and screenings done by your health care provider during your annual well check will depend on your age, overall health,  lifestyle risk factors, and family history of disease. Counseling  Your health care provider may ask you questions about your: Alcohol use. Tobacco use. Drug use. Emotional well-being. Home and relationship well-being. Sexual activity. Eating habits. History of falls. Memory and ability to understand (cognition). Work and work Astronomer. Reproductive health. Screening  You may have the following tests or measurements: Height, weight, and BMI. Blood pressure. Lipid and cholesterol levels. These may be checked every 5 years, or more frequently if you are over 68 years old. Skin check. Lung cancer screening. You may have this screening every year starting at age 67 if you have a 30-pack-year history of smoking and currently smoke or have quit within the past 15 years. Fecal occult blood test (FOBT) of the stool. You may have this test every year starting at age 57. Flexible sigmoidoscopy or colonoscopy. You may have a sigmoidoscopy every 5 years or a colonoscopy every 10 years starting at age 42. Hepatitis C blood test. Hepatitis B blood test. Sexually transmitted disease (STD) testing. Diabetes screening. This is done by checking your blood sugar (glucose) after you have not eaten for a while (fasting). You may have this done every 1-3 years. Bone density scan. This is done to screen for osteoporosis. You may have this done starting at age 51. Mammogram. This may be done every 1-2 years. Talk to your health care provider about how often you should have regular mammograms. Talk with your health care provider about your test results, treatment options, and if necessary, the need for more tests. Vaccines  Your health care provider may recommend certain vaccines, such as: Influenza vaccine. This is recommended every year. Tetanus, diphtheria, and acellular pertussis (Tdap, Td) vaccine. You may need a Td booster  every 10 years. Zoster vaccine. You may need this after age  24. Pneumococcal 13-valent conjugate (PCV13) vaccine. One dose is recommended after age 3. Pneumococcal polysaccharide (PPSV23) vaccine. One dose is recommended after age 38. Talk to your health care provider about which screenings and vaccines you need and how often you need them. This information is not intended to replace advice given to you by your health care provider. Make sure you discuss any questions you have with your health care provider. Document Released: 08/07/2015 Document Revised: 03/30/2016 Document Reviewed: 05/12/2015 Elsevier Interactive Patient Education  2017 [place] Prevention in the Home Falls can cause injuries. They can happen to people of all ages. There are many things you can do to make your home safe and to help prevent falls. What can I do on the outside of my home? Regularly fix the edges of walkways and driveways and fix any cracks. Remove anything that might make you trip as you walk through a door, such as a raised step or threshold. Trim any bushes or trees on the path to your home. Use bright outdoor lighting. Clear any walking paths of anything that might make someone trip, such as rocks or tools. Regularly check to see if handrails are loose or broken. Make sure that both sides of any steps have handrails. Any raised decks and porches should have guardrails on the edges. Have any leaves, snow, or ice cleared regularly. Use sand or salt on walking paths during winter. Clean up any spills in your garage right away. This includes oil or grease spills. What can I do in the bathroom? Use night lights. Install grab bars by the toilet and in the tub and shower. Do not use towel bars as grab bars. Use non-skid mats or decals in the tub or shower. If you need to sit down in the shower, use a plastic, non-slip stool. Keep the floor dry. Clean up any water that spills on the floor as soon as it happens. Remove soap buildup in the tub or shower  regularly. Attach bath mats securely with double-sided non-slip rug tape. Do not have throw rugs and other things on the floor that can make you trip. What can I do in the bedroom? Use night lights. Make sure that you have a light by your bed that is easy to reach. Do not use any sheets or blankets that are too big for your bed. They should not hang down onto the floor. Have a firm chair that has side arms. You can use this for support while you get dressed. Do not have throw rugs and other things on the floor that can make you trip. What can I do in the kitchen? Clean up any spills right away. Avoid walking on wet floors. Keep items that you use a lot in easy-to-reach places. If you need to reach something above you, use a strong step stool that has a grab bar. Keep electrical cords out of the way. Do not use floor polish or wax that makes floors slippery. If you must use wax, use non-skid floor wax. Do not have throw rugs and other things on the floor that can make you trip. What can I do with my stairs? Do not leave any items on the stairs. Make sure that there are handrails on both sides of the stairs and use them. Fix handrails that are broken or loose. Make sure that handrails are as long as the stairways. Check any carpeting to  make sure that it is firmly attached to the stairs. Fix any carpet that is loose or worn. Avoid having throw rugs at the top or bottom of the stairs. If you do have throw rugs, attach them to the floor with carpet tape. Make sure that you have a light switch at the top of the stairs and the bottom of the stairs. If you do not have them, ask someone to add them for you. What else can I do to help prevent falls? Wear shoes that: Do not have high heels. Have rubber bottoms. Are comfortable and fit you well. Are closed at the toe. Do not wear sandals. If you use a stepladder: Make sure that it is fully opened. Do not climb a closed stepladder. Make sure that  both sides of the stepladder are locked into place. Ask someone to hold it for you, if possible. Clearly mark and make sure that you can see: Any grab bars or handrails. First and last steps. Where the edge of each step is. Use tools that help you move around (mobility aids) if they are needed. These include: Canes. Walkers. Scooters. Crutches. Turn on the lights when you go into a dark area. Replace any light bulbs as soon as they burn out. Set up your furniture so you have a clear path. Avoid moving your furniture around. If any of your floors are uneven, fix them. If there are any pets around you, be aware of where they are. Review your medicines with your doctor. Some medicines can make you feel dizzy. This can increase your chance of falling. Ask your doctor what other things that you can do to help prevent falls. This information is not intended to replace advice given to you by your health care provider. Make sure you discuss any questions you have with your health care provider. Document Released: 05/07/2009 Document Revised: 12/17/2015 Document Reviewed: 08/15/2014 Elsevier Interactive Patient Education  2017 [employer].

## 2021-07-06 NOTE — Progress Notes (Signed)
Subjective:   Leah Payne is a 75 y.o. female who presents for Medicare Annual (Subsequent) preventive examination. Virtual Visit via Telephone Note  I connected with  Leah Payne on 07/06/21 at 10:20 AM EST by telephone and verified that I am speaking with the correct person using two identifiers.  Location: Patient: Home Provider: [facility] Persons participating in the virtual visit: patient/Nurse Health Advisor   I discussed the limitations, risks, security and privacy concerns of performing an evaluation and management service by telephone and the availability of in person appointments. The patient expressed understanding and agreed to proceed.  Interactive audio and video telecommunications were attempted between this nurse and patient, however failed, due to patient having technical difficulties OR patient did not have access to video capability.  We continued and completed visit with audio only.  Some vital signs may be absent or patient reported.   Chriss Driver, LPN  Review of Systems     Cardiac Risk Factors include: advanced age (>34mn, >>68women);diabetes mellitus;hypertension;dyslipidemia;sedentary lifestyle;Other (see comment), Risk factor comments: Alzheimers, epilepsy, Wolf-Parkinson-White Syndrome.     Objective:    Today's Vitals   07/06/21 1031  Weight: 145 lb (65.8 kg)  Height: 5' (1.524 m)   Body mass index is 28.32 kg/m.  Advanced Directives 07/06/2021 05/20/2021 12/11/2020 11/15/2020 05/17/2019 01/07/2019 02/22/2018  Does Patient Have a Medical Advance Directive? Yes No Yes No No No No  Type of Advance Directive Healthcare Power of ADaisy Does patient want to make changes to medical advance directive? - - No - Patient declined - - - -  Copy of [place] Chart? No - copy requested - - - - - -  Would patient like information on creating a medical advance directive? - No - Patient  declined - No - Patient declined No - Patient declined No - Patient declined -  Pre-existing out of facility DNR order (yellow form or pink MOST form) - - - - - - -    Current Medications (verified) Outpatient Encounter Medications as of 07/06/2021  Medication Sig   Accu-Chek FastClix Lancets MISC Use as instructed to check blood sugar once daily   Alcohol Swabs (B-D SINGLE USE SWABS REGULAR) PADS Use as instructed to check blood sugar once daily   atorvastatin (LIPITOR) 40 MG tablet Take 1 tablet (40 mg total) by mouth daily.   Blood Glucose Monitoring Suppl (ACCU-CHEK GUIDE) w/Device KIT 1 each by Does not apply route as directed. Use as instructed to check blood sugar once daily   Cholecalciferol (VITAMIN D) 50 MCG (2000 UT) CAPS Take 1 capsule (2,000 Units total) by mouth daily.   donepezil (ARICEPT) 10 MG tablet Take 1 tablet (10 mg total) by mouth daily.   glucose blood (ACCU-CHEK GUIDE) test strip Use as instructed to check blood sugar once daily   levETIRAcetam (KEPPRA) 500 MG tablet TAKE ONE TABLET BY MOUTH TWICE DAILY   levothyroxine (SYNTHROID) 25 MCG tablet TAKE ONE TABLET BY MOUTH BEFORE BREAKFAST   loperamide (IMODIUM A-D) 2 MG capsule Take 1 capsule (2 mg total) by mouth as needed for diarrhea or loose stools.   metFORMIN (GLUCOPHAGE) 500 MG tablet TAKE 1 TABLET EVERY DAY WITH BREAKFAST   [name] 50 MG TB24 tablet TAKE ONE TABLET BY MOUTH EVERYDAY AT BEDTIME (Patient taking differently: Take 50 mg by mouth at bedtime.)   ondansetron (ZOFRAN) 4 MG tablet Take 1  tablet (4 mg total) by mouth every 8 (eight) hours as needed for nausea or vomiting.   propranolol (INDERAL) 10 MG tablet Inderal   sertraline (ZOLOFT) 100 MG tablet Take 1 tablet (100 mg total) by mouth daily.   [DISCONTINUED] propranolol (INDERAL) 10 MG tablet TAKE ONE TABLET BY MOUTH TWICE DAILY   No facility-administered encounter medications on file as of 07/06/2021.    Allergies (verified) Patient has no known  allergies.   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) - saw Dr Hal Neer - no benefit from shunt placement    Hyperlipidemia 08/04/2013   Preaxial polydactyly of right hand congenital   Seizure disorder ([place]) 08/04/2013   WPW (Wolff-Parkinson-White syndrome)    has seen Dr Wynonia Lawman   Past Surgical History:  Procedure Laterality Date   CHOLECYSTECTOMY  1978   Family History  Problem Relation Age of Onset   Stroke Mother 37   Pneumonia Mother    CAD Father 17   Alzheimer's disease Father    Pneumonia Father    Cancer Maternal Aunt        lung (non-smoker)   Diabetes Neg Hx    Social History   Socioeconomic History   Marital status: Married    Spouse name: Not on file   Number of children: Not on file   Years of education: Not on file   Highest education level: High school graduate  Occupational History   Not on file  Tobacco Use   Smoking status: Never   Smokeless tobacco: Never  Vaping Use   Vaping Use: Never used  Substance and Sexual Activity   Alcohol use: No    Alcohol/week: 0.0 standard drinks   Drug use: No   Sexual activity: Not on file  Other Topics Concern   Not on file  Social History Narrative   Lives with husband, 2 dogs. Married x 58 years in 04/2021.   Moved from [facility] [place]   Occupation: retired, was housewife   Edu: completed HS    Activity: no regular exercise. Dizziness precludes activity   Diet: good water, fruits/vegetables daily   Caffeine: 2 cups daily   Social Determinants of Health   Financial Resource Strain: Low Risk    Difficulty of Paying Living Expenses: Not hard at all  Food Insecurity: No  Food Insecurity   Worried About Charity fundraiser in the Last Year: Never true   Ran Out of Food in the Last Year: Never true  Transportation Needs: No Transportation Needs   Lack of Transportation (Medical): No   Lack of Transportation (Non-Medical): No  Physical Activity: Inactive   Days of Exercise per Week: 0 days   Minutes of Exercise per Session: 0 min  Stress: No Stress Concern Present   Feeling of Stress : Not at all  Social Connections: Socially Integrated   Frequency of Communication with Friends and Family: More than three times a week   Frequency of Social Gatherings with Friends and Family: Never   Attends Religious Services: More than 4 times per year   Active Member of [employer] or Organizations: Yes   Attends Archivist Meetings: More than  4 times per year   Marital Status: Married    Tobacco Counseling Counseling given: Not Answered   Clinical Intake:  Pre-visit preparation completed: Yes  Pain : No/denies pain     BMI - recorded: 28.32 Nutritional Status: BMI 25 -29 Overweight Nutritional Risks: None Diabetes: Yes  How often do you need to have someone help you when you read instructions, pamphlets, or other written materials from your doctor or pharmacy?: 4 - Often  Diabetic?Nutrition Risk Assessment:  Has the patient had any N/V/D within the last 2 months?  No  Does the patient have any non-healing wounds?  No  Has the patient had any unintentional weight loss or weight gain?  No   Diabetes:  Is the patient diabetic?  Yes  If diabetic, was a CBG obtained today?  No  Did the patient bring in their glucometer from home?  No Phone visit. How often do you monitor your CBG's? Daily.   Financial Strains and Diabetes Management:  Are you having any financial strains with the device, your supplies or your medication? Yes .  Does the patient want to be seen by Chronic Care Management for management of their diabetes?  No  Would the patient  like to be referred to a Nutritionist or for Diabetic Management?  No   Diabetic Exams:  Diabetic Eye Exam: Completed 06/2020. Overdue for diabetic eye exam. Pt has been advised about the importance in completing this exam. A referral has been placed today. Message sent to referral coordinator for scheduling purposes. Advised pt to expect a call from office referred to regarding appt.  Diabetic Foot Exam: Completed 06/11/2018. Pt has been advised about the importance in completing this exam.  Interpreter Needed?: No  Information entered by :: [place] of Daily Living In your present state of health, do you have any difficulty performing the following activities: 07/06/2021 12/11/2020  Hearing? Y Y  Comment - per spouse, patient appears to have some difficulty with her hearing.  Vision? N N  Difficulty concentrating or making decisions? Tempie Donning  Walking or climbing stairs? Y Y  Dressing or bathing? Y Y  Doing errands, shopping? Tempie Donning  Preparing Food and eating ? Y Y  Using the Toilet? N N  In the past six months, have you accidently leaked urine? Y Y  Do you have problems with loss of bowel control? N Y  Managing your Medications? Y Y  Managing your Finances? Tempie Donning  Housekeeping or managing your Housekeeping? Tempie Donning  Some recent data might be hidden    Patient Care Team: [name], DO as PCP - General (Family Medicine) Debbora Dus, [facility] as Pharmacist (Pharmacist) Dannielle Karvonen, RN as Case Manager [name], LCSW as Social Worker (Licensed Clinical Social Worker)  Indicate any recent [employer] you may have received from other than [facility] providers in the past year (date may be approximate).     Assessment:   This is a routine wellness examination for [facility].  Hearing/Vision screen Hearing Screening - Comments:: Some hearing issues. Vision Screening - Comments:: Glasses. [facility]/2020  Dietary issues and exercise activities  discussed: Current Exercise Habits: The patient does not participate in regular exercise at present, Exercise limited by: cardiac condition(s);neurologic condition(s)   Goals Addressed             This Visit's Progress    Have 3 meals a day  Depression Screen PHQ 2/9 Scores 07/06/2021 06/10/2021 02/24/2020 01/07/2019 09/29/2017 09/23/2016  PHQ - 2 Score 0 0 0 0 0 0  PHQ- 9 Score - - - 0 0 -    Fall Risk Fall Risk  07/06/2021 06/10/2021 01/08/2021 12/11/2020 12/11/2020  Falls in the past year? _0 - 1  Comment - - - - -  Number falls in past yr: _1 Injury with Fall? 0 - 0 0 -  Risk Factor Category  - - - - -  Risk for fall due to : History of fall(s);Impaired balance/gait;Impaired mobility;Mental status change Impaired balance/gait;Impaired mobility;Mental status change History of fall(s);Impaired balance/gait;Other (Comment) History of fall(s);Mental status change -  Risk for fall due to: Comment - - dementia - -  Follow up Falls prevention discussed Falls evaluation completed Falls prevention discussed Falls prevention discussed -    FALL RISK PREVENTION PERTAINING TO THE HOME:  Any stairs in or around the home? No  If so, are there any without handrails? No  Home free of loose throw rugs in walkways, pet beds, electrical cords, etc? Yes  Adequate lighting in your home to reduce risk of falls? Yes   ASSISTIVE DEVICES UTILIZED TO PREVENT FALLS:  Life alert? No  Use of a cane, walker or w/c? Yes  Grab bars in the bathroom? Yes  Shower chair or bench in shower? Yes  Elevated toilet seat or a handicapped toilet? Yes   TIMED UP AND GO:  Was the test performed? No .  Phone visit.  Cognitive Function: MMSE - Mini Mental State Exam 01/07/2019 03/27/2018 09/29/2017 09/19/2017  Not completed: - - (No Data) -  Orientation to time 5 3 - 5  Orientation to Place 5 4 - 4  Registration 3 3 - 3  Attention/ Calculation 0 5 - 4  Recall 2 3 - 1  Recall-comments unable to  recall 1 of 3 words - - -  Language- name 2 objects 0 2 - 2  Language- repeat 1 1 - 1  Language- follow 3 step command 0 2 - 2  Language- read & follow direction 0 1 - 1  Write a sentence 0 1 - 1  Copy design 0 0 - 0  Total score 16 25 - 24        Immunizations Immunization History  Administered Date(s) Administered   Fluad Quad(high Dose 65+) 04/30/2019, 04/07/2020   Influenza, High Dose Seasonal PF 03/31/2017   Influenza,inj,Quad PF,6+ Mos 03/23/2016, 04/05/2018   Influenza-Unspecified 03/26/2015   Janssen (J&J) SARS-COV-2 Vaccination 06/03/2020   Pneumococcal Conjugate-13 08/25/2016   Pneumococcal Polysaccharide-23 03/16/2015    TDAP status: Due, Education has been provided regarding the importance of this vaccine. Advised may receive this vaccine at local pharmacy or Health Dept. Aware to provide a copy of the vaccination record if obtained from local pharmacy or Health Dept. Verbalized acceptance and understanding.  Flu Vaccine status: Due, Education has been provided regarding the importance of this vaccine. Advised may receive this vaccine at local pharmacy or Health Dept. Aware to provide a copy of the vaccination record if obtained from local pharmacy or Health Dept. Verbalized acceptance and understanding.  Pneumococcal vaccine status: Up to date  Covid-19 vaccine status: Information provided on how to obtain vaccines.   Qualifies for Shingles Vaccine? Yes   Zostavax completed No   Shingrix Completed?: No.    Education has been provided regarding the importance of this vaccine. Patient has been advised to  call insurance company to determine out of pocket expense if they have not yet received this vaccine. Advised may also receive vaccine at local pharmacy or Health Dept. Verbalized acceptance and understanding.  Screening Tests Health Maintenance  Topic Date Due   TETANUS/TDAP  Never done   Zoster Vaccines- Shingrix (1 of 2) Never done   DEXA SCAN  Never done    OPHTHALMOLOGY EXAM  09/26/2017   FOOT EXAM  06/12/2019   COVID-19 Vaccine (2 - Booster for Janssen series) 07/29/2020   INFLUENZA VACCINE  02/22/2021   URINE MICROALBUMIN  02/23/2021   HEMOGLOBIN A1C  04/23/2021   Pneumonia Vaccine 3+ Years old  Completed   Hepatitis C Screening  Completed   HPV VACCINES  Aged Out    Health Maintenance  Health Maintenance Due  Topic Date Due   TETANUS/TDAP  Never done   Zoster Vaccines- Shingrix (1 of 2) Never done   DEXA SCAN  Never done   OPHTHALMOLOGY EXAM  09/26/2017   FOOT EXAM  06/12/2019   COVID-19 Vaccine (2 - Booster for Janssen series) 07/29/2020   INFLUENZA VACCINE  02/22/2021   URINE MICROALBUMIN  02/23/2021   HEMOGLOBIN A1C  04/23/2021    Colorectal cancer screening: No longer required.   Mammogram status: No longer required due to age.  Bone Density status: Ordered DECLINED DUE TO MOBILITY ISSUES. Pt provided with contact info and advised to call to schedule appt.  Lung Cancer Screening: (Low Dose CT Chest recommended if Age 20-80 years, 30 pack-year currently smoking OR have quit w/in 15years.) does not qualify.  NON SMOKER.  Additional Screening:  Hepatitis C Screening: does qualify; Completed 08/25/2016  Vision Screening: Recommended annual ophthalmology exams for early detection of glaucoma and other disorders of the eye. Is the patient up to date with their annual eye exam?  Yes  Who is the provider or what is the name of the office in which the patient attends annual eye exams? [facility] If pt is not established with a provider, would they like to be referred to a provider to establish care? No .   Dental Screening: Recommended annual dental exams for proper oral hygiene  Community Resource Referral / Chronic Care Management: CRR required this visit?  No   CCM required this visit?  No      Plan:     I have personally reviewed and noted the following in the patients chart:   Medical and social  history Use of alcohol, tobacco or illicit drugs  Current medications and supplements including opioid prescriptions.  Functional ability and status Nutritional status Physical activity Advanced directives List of other physicians Hospitalizations, surgeries, and ER visits in previous 12 months Vitals Screenings to include cognitive, depression, and falls Referrals and appointments  In addition, I have reviewed and discussed with patient certain preventive protocols, quality metrics, and best practice recommendations. A written personalized care plan for preventive services as well as general preventive health recommendations were provided to patient.     Chriss Driver, LPN   32/20/2542   Nurse Notes: PHONE VISIT. PT AT HOME. NURSE AT [facility]. Visit completed with pt's husband due to Alzheimer's Dx. Colonoscopy and Mammogram no longer required due to age. Declined bone density due to mobility issues. Discussed Flu, Shingles and Tdap vaccines and how to obtain. 6CIT not completed due to Alzheimer's Dx.

## 2021-07-06 NOTE — Telephone Encounter (Signed)
Called pt for AWV. Spoke with pt's husband, [name].  [name] asks if it is possible to have patient's medication changed back to [place] from [place]? [name] states patient is having more leakage of urine with the [name].  [name] also asks if patient can have an order for PT due to mobility issues and for strengthening?

## 2021-07-07 NOTE — Telephone Encounter (Signed)
Pt would like home health PT to help with strength. Please advise. Thank you

## 2021-07-08 ENCOUNTER — Other Ambulatory Visit: Payer: Self-pay

## 2021-07-08 ENCOUNTER — Other Ambulatory Visit: Payer: Self-pay | Admitting: Family Medicine

## 2021-07-08 DIAGNOSIS — G309 Alzheimer's disease, unspecified: Secondary | ICD-10-CM

## 2021-07-08 DIAGNOSIS — Z9181 History of falling: Secondary | ICD-10-CM

## 2021-07-08 DIAGNOSIS — R5381 Other malaise: Secondary | ICD-10-CM

## 2021-07-08 DIAGNOSIS — F0280 Dementia in other diseases classified elsewhere without behavioral disturbance: Secondary | ICD-10-CM

## 2021-07-08 MED ORDER — FESOTERODINE FUMARATE ER 8 MG PO TB24: 8.0000 mg | ORAL_TABLET | Freq: Every day | ORAL | 3 refills | Status: DC

## 2021-07-08 NOTE — Telephone Encounter (Signed)
Please place order for home health PT

## 2021-07-08 NOTE — Telephone Encounter (Signed)
Home health orders placed per drs notes

## 2021-07-13 ENCOUNTER — Other Ambulatory Visit: Payer: Self-pay | Admitting: *Deleted

## 2021-07-13 DIAGNOSIS — F0280 Dementia in other diseases classified elsewhere without behavioral disturbance: Secondary | ICD-10-CM

## 2021-07-13 DIAGNOSIS — G309 Alzheimer's disease, unspecified: Secondary | ICD-10-CM

## 2021-07-13 DIAGNOSIS — Z9181 History of falling: Secondary | ICD-10-CM

## 2021-07-13 DIAGNOSIS — R5381 Other malaise: Secondary | ICD-10-CM

## 2021-07-19 ENCOUNTER — Other Ambulatory Visit: Payer: Self-pay | Admitting: Family Medicine

## 2021-07-28 DIAGNOSIS — E1169 Type 2 diabetes mellitus with other specified complication: Secondary | ICD-10-CM | POA: Diagnosis not present

## 2021-07-28 DIAGNOSIS — E039 Hypothyroidism, unspecified: Secondary | ICD-10-CM | POA: Diagnosis not present

## 2021-07-28 DIAGNOSIS — E785 Hyperlipidemia, unspecified: Secondary | ICD-10-CM | POA: Diagnosis not present

## 2021-07-28 DIAGNOSIS — E1140 Type 2 diabetes mellitus with diabetic neuropathy, unspecified: Secondary | ICD-10-CM | POA: Diagnosis not present

## 2021-07-28 DIAGNOSIS — Z9181 History of falling: Secondary | ICD-10-CM | POA: Diagnosis not present

## 2021-07-28 DIAGNOSIS — G309 Alzheimer's disease, unspecified: Secondary | ICD-10-CM | POA: Diagnosis not present

## 2021-07-28 DIAGNOSIS — I10 Essential (primary) hypertension: Secondary | ICD-10-CM | POA: Diagnosis not present

## 2021-07-28 DIAGNOSIS — G912 (Idiopathic) normal pressure hydrocephalus: Secondary | ICD-10-CM | POA: Diagnosis not present

## 2021-07-28 DIAGNOSIS — R5381 Other malaise: Secondary | ICD-10-CM | POA: Diagnosis not present

## 2021-07-29 ENCOUNTER — Other Ambulatory Visit: Payer: Self-pay | Admitting: Family Medicine

## 2021-08-02 ENCOUNTER — Other Ambulatory Visit: Payer: Self-pay | Admitting: Family Medicine

## 2021-08-02 DIAGNOSIS — G912 (Idiopathic) normal pressure hydrocephalus: Secondary | ICD-10-CM | POA: Diagnosis not present

## 2021-08-02 DIAGNOSIS — E785 Hyperlipidemia, unspecified: Secondary | ICD-10-CM | POA: Diagnosis not present

## 2021-08-02 DIAGNOSIS — I10 Essential (primary) hypertension: Secondary | ICD-10-CM | POA: Diagnosis not present

## 2021-08-02 DIAGNOSIS — R5381 Other malaise: Secondary | ICD-10-CM | POA: Diagnosis not present

## 2021-08-02 DIAGNOSIS — G309 Alzheimer's disease, unspecified: Secondary | ICD-10-CM | POA: Diagnosis not present

## 2021-08-02 DIAGNOSIS — E1169 Type 2 diabetes mellitus with other specified complication: Secondary | ICD-10-CM | POA: Diagnosis not present

## 2021-08-02 DIAGNOSIS — Z9181 History of falling: Secondary | ICD-10-CM | POA: Diagnosis not present

## 2021-08-02 DIAGNOSIS — E1140 Type 2 diabetes mellitus with diabetic neuropathy, unspecified: Secondary | ICD-10-CM | POA: Diagnosis not present

## 2021-08-02 DIAGNOSIS — E039 Hypothyroidism, unspecified: Secondary | ICD-10-CM | POA: Diagnosis not present

## 2021-08-03 ENCOUNTER — Other Ambulatory Visit: Payer: Self-pay | Admitting: Family Medicine

## 2021-08-03 MED ORDER — FESOTERODINE FUMARATE ER 8 MG PO TB24: 8.0000 mg | ORAL_TABLET | Freq: Every day | ORAL | 0 refills | Status: DC

## 2021-08-03 NOTE — Telephone Encounter (Signed)
Patient's husband is requesting new prescription for [name] 8 mg for bed wetting. He states helps prevent patient from wetting the bed. [facility] in [place]

## 2021-08-04 ENCOUNTER — Other Ambulatory Visit: Payer: Self-pay | Admitting: Family Medicine

## 2021-08-04 DIAGNOSIS — E1169 Type 2 diabetes mellitus with other specified complication: Secondary | ICD-10-CM | POA: Diagnosis not present

## 2021-08-04 DIAGNOSIS — G912 (Idiopathic) normal pressure hydrocephalus: Secondary | ICD-10-CM | POA: Diagnosis not present

## 2021-08-04 DIAGNOSIS — R5381 Other malaise: Secondary | ICD-10-CM | POA: Diagnosis not present

## 2021-08-04 DIAGNOSIS — I10 Essential (primary) hypertension: Secondary | ICD-10-CM | POA: Diagnosis not present

## 2021-08-04 DIAGNOSIS — G309 Alzheimer's disease, unspecified: Secondary | ICD-10-CM | POA: Diagnosis not present

## 2021-08-04 DIAGNOSIS — E1140 Type 2 diabetes mellitus with diabetic neuropathy, unspecified: Secondary | ICD-10-CM | POA: Diagnosis not present

## 2021-08-04 DIAGNOSIS — E039 Hypothyroidism, unspecified: Secondary | ICD-10-CM | POA: Diagnosis not present

## 2021-08-04 DIAGNOSIS — E785 Hyperlipidemia, unspecified: Secondary | ICD-10-CM | POA: Diagnosis not present

## 2021-08-04 DIAGNOSIS — Z9181 History of falling: Secondary | ICD-10-CM | POA: Diagnosis not present

## 2021-08-10 DIAGNOSIS — G912 (Idiopathic) normal pressure hydrocephalus: Secondary | ICD-10-CM | POA: Diagnosis not present

## 2021-08-10 DIAGNOSIS — I10 Essential (primary) hypertension: Secondary | ICD-10-CM | POA: Diagnosis not present

## 2021-08-10 DIAGNOSIS — E785 Hyperlipidemia, unspecified: Secondary | ICD-10-CM | POA: Diagnosis not present

## 2021-08-10 DIAGNOSIS — E039 Hypothyroidism, unspecified: Secondary | ICD-10-CM | POA: Diagnosis not present

## 2021-08-10 DIAGNOSIS — E1169 Type 2 diabetes mellitus with other specified complication: Secondary | ICD-10-CM | POA: Diagnosis not present

## 2021-08-10 DIAGNOSIS — G309 Alzheimer's disease, unspecified: Secondary | ICD-10-CM | POA: Diagnosis not present

## 2021-08-10 DIAGNOSIS — E1140 Type 2 diabetes mellitus with diabetic neuropathy, unspecified: Secondary | ICD-10-CM | POA: Diagnosis not present

## 2021-08-10 DIAGNOSIS — Z9181 History of falling: Secondary | ICD-10-CM | POA: Diagnosis not present

## 2021-08-10 DIAGNOSIS — R5381 Other malaise: Secondary | ICD-10-CM | POA: Diagnosis not present

## 2021-08-12 ENCOUNTER — Ambulatory Visit (INDEPENDENT_AMBULATORY_CARE_PROVIDER_SITE_OTHER): Payer: Medicare Other | Admitting: *Deleted

## 2021-08-12 DIAGNOSIS — E039 Hypothyroidism, unspecified: Secondary | ICD-10-CM | POA: Diagnosis not present

## 2021-08-12 DIAGNOSIS — E785 Hyperlipidemia, unspecified: Secondary | ICD-10-CM | POA: Diagnosis not present

## 2021-08-12 DIAGNOSIS — R5381 Other malaise: Secondary | ICD-10-CM | POA: Diagnosis not present

## 2021-08-12 DIAGNOSIS — Z9181 History of falling: Secondary | ICD-10-CM | POA: Diagnosis not present

## 2021-08-12 DIAGNOSIS — G309 Alzheimer's disease, unspecified: Secondary | ICD-10-CM

## 2021-08-12 DIAGNOSIS — E1169 Type 2 diabetes mellitus with other specified complication: Secondary | ICD-10-CM | POA: Diagnosis not present

## 2021-08-12 DIAGNOSIS — E1140 Type 2 diabetes mellitus with diabetic neuropathy, unspecified: Secondary | ICD-10-CM | POA: Diagnosis not present

## 2021-08-12 DIAGNOSIS — F0280 Dementia in other diseases classified elsewhere without behavioral disturbance: Secondary | ICD-10-CM

## 2021-08-12 DIAGNOSIS — I10 Essential (primary) hypertension: Secondary | ICD-10-CM | POA: Diagnosis not present

## 2021-08-12 DIAGNOSIS — G912 (Idiopathic) normal pressure hydrocephalus: Secondary | ICD-10-CM | POA: Diagnosis not present

## 2021-08-12 NOTE — Patient Instructions (Signed)
Visit Information  Thank you for taking time to visit with me today. Please don't hesitate to contact me if I can be of assistance  F Please call the care guide team at [phone] if you need to cancel or reschedule your appointment.   If you are experiencing a Mental Health or Behavioral Health Crisis or need someone to talk to, please call 911   The patient verbalized understanding of instructions, educational materials, and care plan provided today and declined offer to receive copy of patient instructions, educational materials, and care plan.   Reece Levy MSW, LCSW Licensed Visual merchandiser [facility]    [phone]

## 2021-08-12 NOTE — Chronic Care Management (AMB) (Signed)
Chronic Care Management    Clinical Social Work Note  08/12/2021 Name: Leah Payne MRN: [id number] DOB: [birth year]  Leah Payne is a 76 y.o. year old female who is a primary care patient of [name], DO. The CCM team was consulted to assist the patient with chronic disease management and/or care coordination needs related to: [employer] , Level of Care Concerns, and Caregiver Stress.   Engaged with patient by telephone for follow up visit in response to provider referral for social work chronic care management and care coordination services.   Consent to Services:  The patient was given information about Chronic Care Management services, agreed to services, and gave verbal consent prior to initiation of services.  Please see initial visit note for detailed documentation.   Patient agreed to services and consent obtained.   Assessment: Review of patient past medical history, allergies, medications, and health status, including review of relevant consultants reports was performed today as part of a comprehensive evaluation and provision of chronic care management and care coordination services.     SDOH (Social Determinants of Health) assessments and interventions performed:    Advanced Directives Status: Not addressed in this encounter.  CCM Care Plan  No Known Allergies  Outpatient Encounter Medications as of 08/12/2021  Medication Sig   ondansetron (ZOFRAN) 4 MG tablet TAKE ONE TABLET BY MOUTH every EIGHT hours as needed FOR nausea OR vomiting   Accu-Chek FastClix Lancets MISC Use as instructed to check blood sugar once daily   Alcohol Swabs (B-D SINGLE USE SWABS REGULAR) PADS Use as instructed to check blood sugar once daily   atorvastatin (LIPITOR) 40 MG tablet TAKE ONE TABLET BY MOUTH EVERY EVENING   Blood Glucose Monitoring Suppl (ACCU-CHEK GUIDE) w/Device KIT 1 each by Does not apply route as directed. Use as instructed to check blood sugar once daily    Cholecalciferol (VITAMIN D) 50 MCG (2000 UT) CAPS Take 1 capsule (2,000 Units total) by mouth daily.   donepezil (ARICEPT) 10 MG tablet Take 1 tablet (10 mg total) by mouth daily.   fesoterodine ([name]) 8 MG TB24 tablet Take 1 tablet (8 mg total) by mouth daily.   glucose blood (ACCU-CHEK GUIDE) test strip Use as instructed to check blood sugar once daily   levETIRAcetam (KEPPRA) 500 MG tablet TAKE ONE TABLET BY MOUTH TWICE DAILY   levothyroxine (SYNTHROID) 25 MCG tablet TAKE ONE TABLET BY MOUTH BEFORE BREAKFAST   loperamide (IMODIUM A-D) 2 MG capsule Take 1 capsule (2 mg total) by mouth as needed for diarrhea or loose stools.   metFORMIN (GLUCOPHAGE) 500 MG tablet TAKE ONE TABLET BY MOUTH EVERY MORNING   propranolol (INDERAL) 10 MG tablet Inderal   sertraline (ZOLOFT) 100 MG tablet TAKE ONE TABLET BY MOUTH EVERY MORNING   No facility-administered encounter medications on file as of 08/12/2021.    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   Essential hypertension 08/04/2013   Hyperlipidemia associated with type 2  diabetes mellitus (HCC) 08/04/2013    Conditions to be addressed/monitored: Dementia; Lacks knowledge of community resource:    Care Plan : LCSW Plan of Care  Updates made by Buck Mam, LCSW since 08/12/2021 12:00 AM     Problem: Functional Decline and lack of caregiver knowledge for long term care  planning/support Resolved 08/12/2021  Priority: High  Note:   Current barriers:    Limited social support, Level of care concerns, Cognitive Deficits, Memory Deficits, Lacks knowledge of community resources, caregiver stress, and long term care planning/needs Clinical Goals: Patient will work with CSW to address needs related to Long term care planning and needs to provide safety and optimal care/QOL for patient with cognitive impairment and caregiving needs Clinical Interventions:  08/12/21- Spoke with husband who reports they were finally successful in getting pt approved and set up with PCS. She is getting personal care assistance 3 days per week! Husband is pleased with the help and appreciative. CSW will sign off and advised husband to reach out if further needs arise. 05/28/21- CSW spoke with pt's husband who is now home after being re-hospitalized. CSW communicated to him next steps for PCS to be restarted. He shared that someone called and is coming out to the house on Wednesday- not sure if this is the home health agency or PCS so will follow up on Wednesday to inquire further-   aCollaboration with Eustaquio Boyden, MD regarding development and update of comprehensive plan of care as evidenced by provider attestation and co-signature Inter-disciplinary care team collaboration (see longitudinal plan of care) Assessment of needs, barriers , agencies contacted, as well as how impacting  Review various resources, discussed options and provided patient information about Referral to care guide for community support and other options  , PACE Program, Private pay options home health needs, Services provided by [employer] , Dementia resources and support , and Discussed several options for long term counseling based on need and insurance Patient interviewed and appropriate assessments performed Referred patient to community resources care guide team for assistance with community  resources/support, long term care planning, level of care/facility options  Provided patient with information about options to consider with long term planning; in-home and facility options Discussed plans with patient for ongoing care management follow up and provided patient with direct contact information for care management team Assisted patient/caregiver with obtaining information about health plan benefits Provided education to patient/caregiver regarding level of care options. Clinical interventions provided:Solution-Focused Strategies, Active listening / Reflection utilized , Emotional Supportive Provided, Problem Solving /Task Center , Motivational Interviewing, Brief CBT , Caregiver stress acknowledged , Participation in support group encouraged , Consideration of in-home help encouraged , and community resources to be provided for review and consideration Patient Goals/Self-Care Activities: Over the next 30 days Expect call from [facility] re: PCS application  review resource/material sent with community programs and support options begin a notebook of services in my neighborhood or community call 211 when I need some help follow-up on any referrals for help I am given think ahead to make sure my need does not become an emergency make a note about what I need to have by the phone or take with me, like an identification card or social security number have a back-up plan have a back-up plan make a list of family or friends that I can call          Follow Up Plan: Client will reach out through PCP or call if needed  Eduard Clos MSW, LCSW Licensed Holiday representative [employer]    [phone]

## 2021-08-18 DIAGNOSIS — R5381 Other malaise: Secondary | ICD-10-CM | POA: Diagnosis not present

## 2021-08-18 DIAGNOSIS — I10 Essential (primary) hypertension: Secondary | ICD-10-CM | POA: Diagnosis not present

## 2021-08-18 DIAGNOSIS — Z9181 History of falling: Secondary | ICD-10-CM | POA: Diagnosis not present

## 2021-08-18 DIAGNOSIS — G309 Alzheimer's disease, unspecified: Secondary | ICD-10-CM | POA: Diagnosis not present

## 2021-08-18 DIAGNOSIS — E785 Hyperlipidemia, unspecified: Secondary | ICD-10-CM | POA: Diagnosis not present

## 2021-08-18 DIAGNOSIS — E1169 Type 2 diabetes mellitus with other specified complication: Secondary | ICD-10-CM | POA: Diagnosis not present

## 2021-08-18 DIAGNOSIS — E039 Hypothyroidism, unspecified: Secondary | ICD-10-CM | POA: Diagnosis not present

## 2021-08-18 DIAGNOSIS — G912 (Idiopathic) normal pressure hydrocephalus: Secondary | ICD-10-CM | POA: Diagnosis not present

## 2021-08-18 DIAGNOSIS — E1140 Type 2 diabetes mellitus with diabetic neuropathy, unspecified: Secondary | ICD-10-CM | POA: Diagnosis not present

## 2021-08-24 DIAGNOSIS — G309 Alzheimer's disease, unspecified: Secondary | ICD-10-CM | POA: Diagnosis not present

## 2021-08-24 DIAGNOSIS — F0280 Dementia in other diseases classified elsewhere without behavioral disturbance: Secondary | ICD-10-CM

## 2021-08-24 DIAGNOSIS — E785 Hyperlipidemia, unspecified: Secondary | ICD-10-CM | POA: Diagnosis not present

## 2021-08-24 DIAGNOSIS — I10 Essential (primary) hypertension: Secondary | ICD-10-CM | POA: Diagnosis not present

## 2021-08-24 DIAGNOSIS — E1169 Type 2 diabetes mellitus with other specified complication: Secondary | ICD-10-CM | POA: Diagnosis not present

## 2021-08-24 DIAGNOSIS — E039 Hypothyroidism, unspecified: Secondary | ICD-10-CM | POA: Diagnosis not present

## 2021-08-24 DIAGNOSIS — G912 (Idiopathic) normal pressure hydrocephalus: Secondary | ICD-10-CM | POA: Diagnosis not present

## 2021-08-24 DIAGNOSIS — Z9181 History of falling: Secondary | ICD-10-CM | POA: Diagnosis not present

## 2021-08-24 DIAGNOSIS — E1140 Type 2 diabetes mellitus with diabetic neuropathy, unspecified: Secondary | ICD-10-CM | POA: Diagnosis not present

## 2021-08-24 DIAGNOSIS — R5381 Other malaise: Secondary | ICD-10-CM | POA: Diagnosis not present

## 2021-08-25 ENCOUNTER — Other Ambulatory Visit: Payer: Self-pay | Admitting: Family Medicine

## 2021-08-26 ENCOUNTER — Other Ambulatory Visit: Payer: Self-pay | Admitting: Family Medicine

## 2021-08-30 DIAGNOSIS — E1169 Type 2 diabetes mellitus with other specified complication: Secondary | ICD-10-CM | POA: Diagnosis not present

## 2021-08-30 DIAGNOSIS — R5381 Other malaise: Secondary | ICD-10-CM | POA: Diagnosis not present

## 2021-08-30 DIAGNOSIS — Z9181 History of falling: Secondary | ICD-10-CM | POA: Diagnosis not present

## 2021-08-30 DIAGNOSIS — E039 Hypothyroidism, unspecified: Secondary | ICD-10-CM | POA: Diagnosis not present

## 2021-08-30 DIAGNOSIS — E1140 Type 2 diabetes mellitus with diabetic neuropathy, unspecified: Secondary | ICD-10-CM | POA: Diagnosis not present

## 2021-08-30 DIAGNOSIS — G309 Alzheimer's disease, unspecified: Secondary | ICD-10-CM | POA: Diagnosis not present

## 2021-08-30 DIAGNOSIS — I10 Essential (primary) hypertension: Secondary | ICD-10-CM | POA: Diagnosis not present

## 2021-08-30 DIAGNOSIS — G912 (Idiopathic) normal pressure hydrocephalus: Secondary | ICD-10-CM | POA: Diagnosis not present

## 2021-08-30 DIAGNOSIS — E785 Hyperlipidemia, unspecified: Secondary | ICD-10-CM | POA: Diagnosis not present

## 2021-08-31 DIAGNOSIS — G912 (Idiopathic) normal pressure hydrocephalus: Secondary | ICD-10-CM | POA: Diagnosis not present

## 2021-08-31 DIAGNOSIS — I10 Essential (primary) hypertension: Secondary | ICD-10-CM | POA: Diagnosis not present

## 2021-08-31 DIAGNOSIS — E1140 Type 2 diabetes mellitus with diabetic neuropathy, unspecified: Secondary | ICD-10-CM | POA: Diagnosis not present

## 2021-08-31 DIAGNOSIS — Z9181 History of falling: Secondary | ICD-10-CM | POA: Diagnosis not present

## 2021-08-31 DIAGNOSIS — E785 Hyperlipidemia, unspecified: Secondary | ICD-10-CM | POA: Diagnosis not present

## 2021-08-31 DIAGNOSIS — R5381 Other malaise: Secondary | ICD-10-CM | POA: Diagnosis not present

## 2021-08-31 DIAGNOSIS — E039 Hypothyroidism, unspecified: Secondary | ICD-10-CM | POA: Diagnosis not present

## 2021-08-31 DIAGNOSIS — E1169 Type 2 diabetes mellitus with other specified complication: Secondary | ICD-10-CM | POA: Diagnosis not present

## 2021-08-31 DIAGNOSIS — G309 Alzheimer's disease, unspecified: Secondary | ICD-10-CM | POA: Diagnosis not present

## 2021-09-01 DIAGNOSIS — E785 Hyperlipidemia, unspecified: Secondary | ICD-10-CM | POA: Diagnosis not present

## 2021-09-01 DIAGNOSIS — G912 (Idiopathic) normal pressure hydrocephalus: Secondary | ICD-10-CM | POA: Diagnosis not present

## 2021-09-01 DIAGNOSIS — E039 Hypothyroidism, unspecified: Secondary | ICD-10-CM | POA: Diagnosis not present

## 2021-09-01 DIAGNOSIS — R5381 Other malaise: Secondary | ICD-10-CM | POA: Diagnosis not present

## 2021-09-01 DIAGNOSIS — Z9181 History of falling: Secondary | ICD-10-CM | POA: Diagnosis not present

## 2021-09-01 DIAGNOSIS — E1169 Type 2 diabetes mellitus with other specified complication: Secondary | ICD-10-CM | POA: Diagnosis not present

## 2021-09-01 DIAGNOSIS — E1140 Type 2 diabetes mellitus with diabetic neuropathy, unspecified: Secondary | ICD-10-CM | POA: Diagnosis not present

## 2021-09-01 DIAGNOSIS — I10 Essential (primary) hypertension: Secondary | ICD-10-CM | POA: Diagnosis not present

## 2021-09-01 DIAGNOSIS — G309 Alzheimer's disease, unspecified: Secondary | ICD-10-CM | POA: Diagnosis not present

## 2021-09-03 ENCOUNTER — Telehealth: Payer: Self-pay | Admitting: *Deleted

## 2021-09-03 NOTE — Chronic Care Management (AMB) (Signed)
Chronic Care Management   Note  09/03/2021 Name: Leah Payne MRN: [id number] DOB: 1945-11-21  Leah Payne is a 76 y.o. year old female who is a primary care patient of [name], DO. I reached out to Leah Payne by phone today in response to a referral sent by Leah Payne PCP.  Leah Payne was given information about Chronic Care Management services today including:  CCM service includes personalized support from designated clinical staff supervised by her physician, including individualized plan of care and coordination with other care providers 24/7 contact phone numbers for assistance for urgent and routine care needs. Service will only be billed when office clinical staff spend 20 minutes or more in a month to coordinate care. Only one practitioner may furnish and bill the service in a calendar month. The patient may stop CCM services at any time (effective at the end of the month) by phone call to the office staff. The patient is responsible for co-pay (up to 20% after annual deductible is met) if co-pay is required by the individual health plan.   Leah Payne Spouse  verbally agreed to assistance and services provided by embedded care coordination/care management team today.  Follow up plan: Telephone appointment with care management team member scheduled for:09/29/21  [facility]  Direct Dial: [phone]

## 2021-09-07 DIAGNOSIS — G309 Alzheimer's disease, unspecified: Secondary | ICD-10-CM | POA: Diagnosis not present

## 2021-09-07 DIAGNOSIS — E039 Hypothyroidism, unspecified: Secondary | ICD-10-CM | POA: Diagnosis not present

## 2021-09-07 DIAGNOSIS — E1140 Type 2 diabetes mellitus with diabetic neuropathy, unspecified: Secondary | ICD-10-CM | POA: Diagnosis not present

## 2021-09-07 DIAGNOSIS — E1169 Type 2 diabetes mellitus with other specified complication: Secondary | ICD-10-CM | POA: Diagnosis not present

## 2021-09-07 DIAGNOSIS — I10 Essential (primary) hypertension: Secondary | ICD-10-CM | POA: Diagnosis not present

## 2021-09-07 DIAGNOSIS — R5381 Other malaise: Secondary | ICD-10-CM | POA: Diagnosis not present

## 2021-09-07 DIAGNOSIS — E785 Hyperlipidemia, unspecified: Secondary | ICD-10-CM | POA: Diagnosis not present

## 2021-09-07 DIAGNOSIS — Z9181 History of falling: Secondary | ICD-10-CM | POA: Diagnosis not present

## 2021-09-07 DIAGNOSIS — G912 (Idiopathic) normal pressure hydrocephalus: Secondary | ICD-10-CM | POA: Diagnosis not present

## 2021-09-08 DIAGNOSIS — E785 Hyperlipidemia, unspecified: Secondary | ICD-10-CM | POA: Diagnosis not present

## 2021-09-08 DIAGNOSIS — Z9181 History of falling: Secondary | ICD-10-CM | POA: Diagnosis not present

## 2021-09-08 DIAGNOSIS — G309 Alzheimer's disease, unspecified: Secondary | ICD-10-CM | POA: Diagnosis not present

## 2021-09-08 DIAGNOSIS — R5381 Other malaise: Secondary | ICD-10-CM | POA: Diagnosis not present

## 2021-09-08 DIAGNOSIS — G912 (Idiopathic) normal pressure hydrocephalus: Secondary | ICD-10-CM | POA: Diagnosis not present

## 2021-09-08 DIAGNOSIS — E039 Hypothyroidism, unspecified: Secondary | ICD-10-CM | POA: Diagnosis not present

## 2021-09-08 DIAGNOSIS — I10 Essential (primary) hypertension: Secondary | ICD-10-CM | POA: Diagnosis not present

## 2021-09-08 DIAGNOSIS — E1169 Type 2 diabetes mellitus with other specified complication: Secondary | ICD-10-CM | POA: Diagnosis not present

## 2021-09-08 DIAGNOSIS — E1140 Type 2 diabetes mellitus with diabetic neuropathy, unspecified: Secondary | ICD-10-CM | POA: Diagnosis not present

## 2021-09-10 ENCOUNTER — Ambulatory Visit (INDEPENDENT_AMBULATORY_CARE_PROVIDER_SITE_OTHER): Payer: Medicare Other | Admitting: Family Medicine

## 2021-09-10 ENCOUNTER — Encounter: Payer: Self-pay | Admitting: Family Medicine

## 2021-09-10 ENCOUNTER — Other Ambulatory Visit: Payer: Self-pay

## 2021-09-10 DIAGNOSIS — I10 Essential (primary) hypertension: Secondary | ICD-10-CM | POA: Diagnosis not present

## 2021-09-10 DIAGNOSIS — E1142 Type 2 diabetes mellitus with diabetic polyneuropathy: Secondary | ICD-10-CM

## 2021-09-10 DIAGNOSIS — N3281 Overactive bladder: Secondary | ICD-10-CM | POA: Diagnosis not present

## 2021-09-10 DIAGNOSIS — E039 Hypothyroidism, unspecified: Secondary | ICD-10-CM

## 2021-09-10 DIAGNOSIS — E1169 Type 2 diabetes mellitus with other specified complication: Secondary | ICD-10-CM | POA: Diagnosis not present

## 2021-09-10 DIAGNOSIS — E785 Hyperlipidemia, unspecified: Secondary | ICD-10-CM

## 2021-09-10 MED ORDER — FESOTERODINE FUMARATE ER 8 MG PO TB24: 8.0000 mg | ORAL_TABLET | Freq: Every day | ORAL | 1 refills | Status: DC

## 2021-09-10 NOTE — Patient Instructions (Signed)
Restart [name].  Continue current medications.  Follow up in 6 months.  Take care  Dr. Lacinda Axon

## 2021-09-13 NOTE — Progress Notes (Signed)
Subjective:  Patient ID: Leah Payne, female    DOB: 1945/10/04  Age: 76 y.o. MRN: [id number]  CC: Chief Complaint  Patient presents with   Follow-up    Pt has been wetting the bed a lot. Also has been having diarrhea a lot. Pt was on [name] and was taken off of it but husband states that [name] helped her.     HPI:  76 year old female with Alzheimer's disease, overactive bladder, seizure disorder, type 2 diabetes, hypothyroidism, hyperlipidemia, hypertension presents for follow-up.  Patient has been doing fairly well.  Husband does note that she has been having more incontinence.  He states that her medication was stopped and I am unsure why.  I sent this medication in recently.  He states that she takes [name] and it works well for her.  I will send in a new prescription today.  Glucose is well controlled.  She has been in the prediabetic range regarding A1c's.  She is currently on metformin and doing well.  Hyperlipidemia well-controlled on Lipitor.  Hypertension has been stable.  She is on propranolol.  Mood stable on Aricept and Zoloft.  Hypothyroidism also stable.  Most recent TSH 2.610.  Patient Active Problem List   Diagnosis Date Noted   Asymmetrical hearing loss of right ear 02/25/2020   Vitamin D deficiency 03/21/2017   Advanced care planning/counseling discussion 09/23/2016   Alzheimer disease (HCC) 12/17/2015   WPW (Wolff-Parkinson-White syndrome)    Type 2 diabetes, controlled, 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HCC) 09/17/2015   Hypothyroidism 09/17/2015   Debility    Overactive bladder 08/29/2014   Seizure disorder (HCC) 08/04/2013   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HCC) 08/04/2013   Essential hypertension 08/04/2013   Hyperlipidemia associated with type 2 diabetes mellitus (HCC) 08/04/2013    Social Hx   Social History   Socioeconomic History   Marital status: Married    Spouse name: Not on file   Number of children: Not on file   Years of education: Not  on file   Highest education level: High school graduate  Occupational History   Not on file  Tobacco Use   Smoking status: Never   Smokeless tobacco: Never  Vaping Use   Vaping Use: Never used  Substance and Sexual Activity   Alcohol use: No    Alcohol/week: 0.0 standard drinks   Drug use: No   Sexual activity: Not on file  Other Topics Concern   Not on file  Social History Narrative   Lives with husband, 2 dogs. Married x 58 years in 04/2021.   Moved from [place] [facility] [place]   Occupation: retired, was housewife   Edu: completed HS    Activity: no regular exercise. Dizziness precludes activity   Diet: good water, fruits/vegetables daily   Caffeine: 2 cups daily   Social Determinants of Health   Financial Resource Strain: Low Risk    Difficulty of Paying Living Expenses: Not hard at all  Food Insecurity: No Food Insecurity   Worried About Programme researcher, broadcasting/film/video in the Last Year: Never true   Ran Out of Food in the Last Year: Never true  Transportation Needs: No Transportation Needs   Lack of Transportation (Medical): No   Lack of Transportation (Non-Medical): No  Physical Activity: Inactive   Days of Exercise per Week: 0 days   Minutes of Exercise per Session: 0 min  Stress: No Stress Concern Present   Feeling of Stress : Not at all  Social Connections: Socially Integrated   Frequency of  Communication with Friends and Family: More than three times a week   Frequency of Social Gatherings with Friends and Family: Never   Attends Religious Services: More than 4 times per year   Active Member of Clubs or Organizations: Yes   Attends Engineer, structural: More than 4 times per year   Marital Status: Married    Review of Systems Per HPI  Objective:  BP 140/86    Pulse 75    Temp (!) 97.4 F (36.3 C)    Wt 141 lb 12.8 oz (64.3 kg)    SpO2 96%    BMI 27.69 kg/m   BP/Weight 09/10/2021 07/06/2021 06/10/2021  Systolic BP 140 - 135  Diastolic BP 86 - 84  Wt. (Lbs) 141.8 145 -   BMI 27.69 28.32 29.79    Physical Exam Vitals and nursing note reviewed.  Constitutional:      General: She is not in acute distress. Eyes:     General:        Right eye: No discharge.        Left eye: No discharge.     Conjunctiva/sclera: Conjunctivae normal.  Cardiovascular:     Rate and Rhythm: Normal rate and regular rhythm.  Pulmonary:     Effort: Pulmonary effort is normal.     Breath sounds: Normal breath sounds. No wheezing, rhonchi or rales.  Neurological:     Mental Status: She is alert.  Psychiatric:        Mood and Affect: Mood normal.        Behavior: Behavior normal.    Lab Results  Component Value Date   WBC 5.0 05/20/2021   HGB 12.5 05/20/2021   HCT 38.6 05/20/2021   PLT 244 05/20/2021   GLUCOSE 108 (H) 05/20/2021   CHOL 154 06/10/2021   TRIG 127 06/10/2021   HDL 48 06/10/2021   LDLDIRECT 100.0 09/17/2015   LDLCALC 83 06/10/2021   ALT 18 05/20/2021   AST 19 05/20/2021   NA 135 05/20/2021   K 3.9 05/20/2021   CL 103 05/20/2021   CREATININE 0.68 05/20/2021   BUN 8 05/20/2021   CO2 26 05/20/2021   TSH 2.610 06/10/2021   HGBA1C 6.0 (A) 10/21/2020   MICROALBUR <0.7 02/24/2020     Assessment & Plan:   Problem List Items Addressed This Visit       Cardiovascular and Mediastinum   Essential hypertension    Stable. Continue current medication.        Endocrine   Type 2 diabetes, controlled, 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HCC)    Well controlled. Continue Metformin.      Hypothyroidism    Stable. Most recent TSH 2.610.      Hyperlipidemia associated with type 2 diabetes mellitus (HCC)    Well controlled on Lipitor. Continue.         Genitourinary   Overactive bladder    Rx sent in for [name] once again.  Advised that she may continue the medication.       Meds ordered this encounter  Medications   fesoterodine ([name]) 8 MG TB24 tablet    Sig: Take 1 tablet (8 mg total) by mouth daily.    Dispense:  90 tablet    Refill:  1     Follow-up:  Return in about 6 months (around 03/10/2022).  Everlene Other DO [facility]

## 2021-09-13 NOTE — Assessment & Plan Note (Signed)
Stable.  Continue current medication

## 2021-09-13 NOTE — Assessment & Plan Note (Signed)
Well-controlled.  Continue Metformin. 

## 2021-09-13 NOTE — Assessment & Plan Note (Signed)
Stable. Most recent TSH 2.610.

## 2021-09-13 NOTE — Assessment & Plan Note (Signed)
Rx sent in for [name] once again.  Advised that she may continue the medication.

## 2021-09-13 NOTE — Assessment & Plan Note (Signed)
Well-controlled on Lipitor.  Continue. 

## 2021-09-14 DIAGNOSIS — E039 Hypothyroidism, unspecified: Secondary | ICD-10-CM | POA: Diagnosis not present

## 2021-09-14 DIAGNOSIS — G309 Alzheimer's disease, unspecified: Secondary | ICD-10-CM | POA: Diagnosis not present

## 2021-09-14 DIAGNOSIS — Z9181 History of falling: Secondary | ICD-10-CM | POA: Diagnosis not present

## 2021-09-14 DIAGNOSIS — I10 Essential (primary) hypertension: Secondary | ICD-10-CM | POA: Diagnosis not present

## 2021-09-14 DIAGNOSIS — E1140 Type 2 diabetes mellitus with diabetic neuropathy, unspecified: Secondary | ICD-10-CM | POA: Diagnosis not present

## 2021-09-14 DIAGNOSIS — E1169 Type 2 diabetes mellitus with other specified complication: Secondary | ICD-10-CM | POA: Diagnosis not present

## 2021-09-14 DIAGNOSIS — E785 Hyperlipidemia, unspecified: Secondary | ICD-10-CM | POA: Diagnosis not present

## 2021-09-14 DIAGNOSIS — G912 (Idiopathic) normal pressure hydrocephalus: Secondary | ICD-10-CM | POA: Diagnosis not present

## 2021-09-14 DIAGNOSIS — R5381 Other malaise: Secondary | ICD-10-CM | POA: Diagnosis not present

## 2021-09-15 DIAGNOSIS — Z9181 History of falling: Secondary | ICD-10-CM | POA: Diagnosis not present

## 2021-09-15 DIAGNOSIS — G309 Alzheimer's disease, unspecified: Secondary | ICD-10-CM | POA: Diagnosis not present

## 2021-09-15 DIAGNOSIS — E1169 Type 2 diabetes mellitus with other specified complication: Secondary | ICD-10-CM | POA: Diagnosis not present

## 2021-09-15 DIAGNOSIS — G912 (Idiopathic) normal pressure hydrocephalus: Secondary | ICD-10-CM | POA: Diagnosis not present

## 2021-09-15 DIAGNOSIS — R5381 Other malaise: Secondary | ICD-10-CM | POA: Diagnosis not present

## 2021-09-15 DIAGNOSIS — E1140 Type 2 diabetes mellitus with diabetic neuropathy, unspecified: Secondary | ICD-10-CM | POA: Diagnosis not present

## 2021-09-15 DIAGNOSIS — E039 Hypothyroidism, unspecified: Secondary | ICD-10-CM | POA: Diagnosis not present

## 2021-09-15 DIAGNOSIS — I10 Essential (primary) hypertension: Secondary | ICD-10-CM | POA: Diagnosis not present

## 2021-09-15 DIAGNOSIS — E785 Hyperlipidemia, unspecified: Secondary | ICD-10-CM | POA: Diagnosis not present

## 2021-09-20 DIAGNOSIS — E039 Hypothyroidism, unspecified: Secondary | ICD-10-CM | POA: Diagnosis not present

## 2021-09-20 DIAGNOSIS — Z9181 History of falling: Secondary | ICD-10-CM | POA: Diagnosis not present

## 2021-09-20 DIAGNOSIS — R5381 Other malaise: Secondary | ICD-10-CM | POA: Diagnosis not present

## 2021-09-20 DIAGNOSIS — E785 Hyperlipidemia, unspecified: Secondary | ICD-10-CM | POA: Diagnosis not present

## 2021-09-20 DIAGNOSIS — I10 Essential (primary) hypertension: Secondary | ICD-10-CM | POA: Diagnosis not present

## 2021-09-20 DIAGNOSIS — G309 Alzheimer's disease, unspecified: Secondary | ICD-10-CM | POA: Diagnosis not present

## 2021-09-20 DIAGNOSIS — E1169 Type 2 diabetes mellitus with other specified complication: Secondary | ICD-10-CM | POA: Diagnosis not present

## 2021-09-20 DIAGNOSIS — E1140 Type 2 diabetes mellitus with diabetic neuropathy, unspecified: Secondary | ICD-10-CM | POA: Diagnosis not present

## 2021-09-20 DIAGNOSIS — G912 (Idiopathic) normal pressure hydrocephalus: Secondary | ICD-10-CM | POA: Diagnosis not present

## 2021-09-21 ENCOUNTER — Telehealth: Payer: Self-pay | Admitting: Family Medicine

## 2021-09-21 DIAGNOSIS — E039 Hypothyroidism, unspecified: Secondary | ICD-10-CM | POA: Diagnosis not present

## 2021-09-21 DIAGNOSIS — E1140 Type 2 diabetes mellitus with diabetic neuropathy, unspecified: Secondary | ICD-10-CM | POA: Diagnosis not present

## 2021-09-21 DIAGNOSIS — G309 Alzheimer's disease, unspecified: Secondary | ICD-10-CM | POA: Diagnosis not present

## 2021-09-21 DIAGNOSIS — R5381 Other malaise: Secondary | ICD-10-CM | POA: Diagnosis not present

## 2021-09-21 DIAGNOSIS — E785 Hyperlipidemia, unspecified: Secondary | ICD-10-CM | POA: Diagnosis not present

## 2021-09-21 DIAGNOSIS — E1169 Type 2 diabetes mellitus with other specified complication: Secondary | ICD-10-CM | POA: Diagnosis not present

## 2021-09-21 DIAGNOSIS — G912 (Idiopathic) normal pressure hydrocephalus: Secondary | ICD-10-CM | POA: Diagnosis not present

## 2021-09-21 DIAGNOSIS — Z9181 History of falling: Secondary | ICD-10-CM | POA: Diagnosis not present

## 2021-09-21 DIAGNOSIS — I10 Essential (primary) hypertension: Secondary | ICD-10-CM | POA: Diagnosis not present

## 2021-09-21 NOTE — Telephone Encounter (Signed)
Will contacted and verbalized understanding.  

## 2021-09-21 NOTE — Telephone Encounter (Signed)
Leah Payne from [facility] calling to get verbal orders for 2 times a week for 3 weeks and then one time a week for 3 weeks. Leah Payne completed evaluation as ordered. He is also requesting orders for MSW Social Worker for an initial evaluation. Leah Payne states his number does have a secure voicemail and we may leave detailed message. Please advise. Thank you.

## 2021-09-22 DIAGNOSIS — R471 Dysarthria and anarthria: Secondary | ICD-10-CM | POA: Diagnosis not present

## 2021-09-22 DIAGNOSIS — E1169 Type 2 diabetes mellitus with other specified complication: Secondary | ICD-10-CM | POA: Diagnosis not present

## 2021-09-22 DIAGNOSIS — E1140 Type 2 diabetes mellitus with diabetic neuropathy, unspecified: Secondary | ICD-10-CM | POA: Diagnosis not present

## 2021-09-22 DIAGNOSIS — E785 Hyperlipidemia, unspecified: Secondary | ICD-10-CM | POA: Diagnosis not present

## 2021-09-22 DIAGNOSIS — E039 Hypothyroidism, unspecified: Secondary | ICD-10-CM | POA: Diagnosis not present

## 2021-09-22 DIAGNOSIS — G309 Alzheimer's disease, unspecified: Secondary | ICD-10-CM | POA: Diagnosis not present

## 2021-09-22 DIAGNOSIS — I10 Essential (primary) hypertension: Secondary | ICD-10-CM | POA: Diagnosis not present

## 2021-09-22 DIAGNOSIS — R5381 Other malaise: Secondary | ICD-10-CM | POA: Diagnosis not present

## 2021-09-28 DIAGNOSIS — G309 Alzheimer's disease, unspecified: Secondary | ICD-10-CM | POA: Diagnosis not present

## 2021-09-28 DIAGNOSIS — E785 Hyperlipidemia, unspecified: Secondary | ICD-10-CM | POA: Diagnosis not present

## 2021-09-28 DIAGNOSIS — R5381 Other malaise: Secondary | ICD-10-CM | POA: Diagnosis not present

## 2021-09-28 DIAGNOSIS — E1140 Type 2 diabetes mellitus with diabetic neuropathy, unspecified: Secondary | ICD-10-CM | POA: Diagnosis not present

## 2021-09-28 DIAGNOSIS — E039 Hypothyroidism, unspecified: Secondary | ICD-10-CM | POA: Diagnosis not present

## 2021-09-28 DIAGNOSIS — R471 Dysarthria and anarthria: Secondary | ICD-10-CM | POA: Diagnosis not present

## 2021-09-28 DIAGNOSIS — E1169 Type 2 diabetes mellitus with other specified complication: Secondary | ICD-10-CM | POA: Diagnosis not present

## 2021-09-28 DIAGNOSIS — I10 Essential (primary) hypertension: Secondary | ICD-10-CM | POA: Diagnosis not present

## 2021-09-29 ENCOUNTER — Ambulatory Visit (INDEPENDENT_AMBULATORY_CARE_PROVIDER_SITE_OTHER): Payer: Medicare Other | Admitting: *Deleted

## 2021-09-29 DIAGNOSIS — R5381 Other malaise: Secondary | ICD-10-CM | POA: Diagnosis not present

## 2021-09-29 DIAGNOSIS — I10 Essential (primary) hypertension: Secondary | ICD-10-CM | POA: Diagnosis not present

## 2021-09-29 DIAGNOSIS — E1142 Type 2 diabetes mellitus with diabetic polyneuropathy: Secondary | ICD-10-CM

## 2021-09-29 DIAGNOSIS — R471 Dysarthria and anarthria: Secondary | ICD-10-CM | POA: Diagnosis not present

## 2021-09-29 DIAGNOSIS — E1169 Type 2 diabetes mellitus with other specified complication: Secondary | ICD-10-CM | POA: Diagnosis not present

## 2021-09-29 DIAGNOSIS — F02818 Dementia in other diseases classified elsewhere, unspecified severity, with other behavioral disturbance: Secondary | ICD-10-CM

## 2021-09-29 DIAGNOSIS — E785 Hyperlipidemia, unspecified: Secondary | ICD-10-CM | POA: Diagnosis not present

## 2021-09-29 DIAGNOSIS — Z9181 History of falling: Secondary | ICD-10-CM

## 2021-09-29 DIAGNOSIS — G309 Alzheimer's disease, unspecified: Secondary | ICD-10-CM | POA: Diagnosis not present

## 2021-09-29 DIAGNOSIS — E1140 Type 2 diabetes mellitus with diabetic neuropathy, unspecified: Secondary | ICD-10-CM | POA: Diagnosis not present

## 2021-09-29 DIAGNOSIS — G301 Alzheimer's disease with late onset: Secondary | ICD-10-CM

## 2021-09-29 DIAGNOSIS — E039 Hypothyroidism, unspecified: Secondary | ICD-10-CM | POA: Diagnosis not present

## 2021-09-29 NOTE — Patient Instructions (Signed)
Visit Information ? ?Thank you for taking time to visit with me today. Please don't hesitate to contact me if I can be of assistance to you before our next scheduled telephone appointment. ? ?Following are the goals we discussed today:  ?Take medications as prescribed   ?Attend all scheduled provider appointments ?Call pharmacy for medication refills 3-7 days in advance of running out of medications ?Call provider office for new concerns or questions  ?check blood sugar at prescribed times: per doctor's order  ?check feet daily for cuts, sores or redness ?enter blood sugar readings and medication or insulin into daily log ?take the blood sugar log to all doctor visits ?take the blood sugar meter to all doctor visits ?read food labels for fat, fiber, carbohydrates and portion size ?check blood pressure weekly ?choose a place to take my blood pressure (home, clinic or office, retail store) ?write blood pressure results in a log or diary ?take blood pressure log to all doctor appointments ?Follow low sodium diet ?Always ask for assistance as needed ?Follow up with home health agency about your new walker ?fall prevention strategies: change position slowly, use assistive device such as walker or cane (per provider recommendations) when walking, keep walkways clear, have good lighting in room. It is important to contact your provider if you have any falls, maintain muscle strength/tone by exercise per provider recommendations. ?For patients with dementia: keep a routine, such as bathing dressing eating at the same time each day, plan activities that the person enjoys and try to do them at the same time each day, serve meals in a consistent, familiar place and give patient enough time to eat, encourage use of loose-fitting, comfortable, easy to use clothing such as, elastic waistbands, fabric fasteners, or large zippers pulls instead of shoelaces, buttons, or buckles.  ? ?Our next appointment is by telephone on 11/03/21 at  1045 am ? ?Please call the care guide team at [phone] if you need to cancel or reschedule your appointment.  ? ?If you are experiencing a Mental Health or Behavioral Health Crisis or need someone to talk to, please call the Suicide and Crisis Lifeline: 988 ?call the [place] National Suicide Prevention Lifeline: 505-268-3739 or TTY: 660-569-6616 TTY (216) 197-4603) to talk to a trained counselor ?call 1-800-273-TALK (toll free, 24 hour hotline) ?go to [facility] [address] [place] [phone]) ?call the [facility]: [phone] ?call 911  ? ?The patient verbalized understanding of instructions, educational materials, and care plan provided today and declined offer to receive copy of patient instructions, educational materials, and care plan.  ? ?Irving Shows RNC, BSN ?RN Case Manager ?[facility] ?[phone] ? ?

## 2021-09-29 NOTE — Chronic Care Management (AMB) (Signed)
Chronic Care Management   CCM RN Visit Note  09/29/2021 Name: Leah Payne MRN: [id number] DOB: 01-07-1946  Subjective: Leah Payne is a 76 y.o. year old female who is a primary care patient of [name], DO. The care management team was consulted for assistance with disease management and care coordination needs.    Engaged with patient by telephone for follow up visit in response to provider referral for case management and/or care coordination services.   Consent to Services:  The patient was given information about Chronic Care Management services, agreed to services, and gave verbal consent prior to initiation of services.  Please see initial visit note for detailed documentation.   Patient agreed to services and verbal consent obtained.   Assessment: Review of patient past medical history, allergies, medications, health status, including review of consultants reports, laboratory and other test data, was performed as part of comprehensive evaluation and provision of chronic care management services.   SDOH (Social Determinants of Health) assessments and interventions performed:    CCM Care Plan  No Known Allergies  Outpatient Encounter Medications as of 09/29/2021  Medication Sig   Accu-Chek FastClix Lancets MISC Use as instructed to check blood sugar once daily   Alcohol Swabs (B-D SINGLE USE SWABS REGULAR) PADS Use as instructed to check blood sugar once daily   atorvastatin (LIPITOR) 40 MG tablet TAKE ONE TABLET BY MOUTH EVERY EVENING   Blood Glucose Monitoring Suppl (ACCU-CHEK GUIDE) w/Device KIT 1 each by Does not apply route as directed. Use as instructed to check blood sugar once daily   Cholecalciferol (VITAMIN D) 50 MCG (2000 UT) CAPS Take 1 capsule (2,000 Units total) by mouth daily.   donepezil (ARICEPT) 10 MG tablet Take 1 tablet (10 mg total) by mouth daily.   fesoterodine ([name]) 8 MG TB24 tablet Take 1 tablet (8 mg total) by mouth daily.   glucose  blood (ACCU-CHEK GUIDE) test strip Use as instructed to check blood sugar once daily   levETIRAcetam (KEPPRA) 500 MG tablet TAKE ONE TABLET BY MOUTH TWICE DAILY   levothyroxine (SYNTHROID) 25 MCG tablet TAKE ONE TABLET BY MOUTH BEFORE BREAKFAST   loperamide (IMODIUM A-D) 2 MG capsule Take 1 capsule (2 mg total) by mouth as needed for diarrhea or loose stools.   metFORMIN (GLUCOPHAGE) 500 MG tablet TAKE ONE TABLET BY MOUTH EVERY MORNING   ondansetron (ZOFRAN) 4 MG tablet TAKE ONE TABLET BY MOUTH every EIGHT hours as needed FOR nausea OR vomiting   propranolol (INDERAL) 10 MG tablet Inderal   sertraline (ZOLOFT) 100 MG tablet TAKE ONE TABLET BY MOUTH EVERY MORNING   No facility-administered encounter medications on file as of 09/29/2021.    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[place]) 08/04/2013   Essential hypertension 08/04/2013   Hyperlipidemia associated with type 2 diabetes mellitus ([place]) 08/04/2013    Conditions to be addressed/monitored:HTN, DMII, Dementia, and Falls  Care Plan : RN Care Manager Plan of Care  Updates made by Kassie Mends, RN since 09/29/2021 12:00 AM     Problem: No plan of  care established for management of chronic disease state  (HTN, DM2, Dementia, Falls)   Priority: High     Long-Range Goal: Development of plan of care for chronic disease management  (HTN, DM2, Dementia, Falls)   Start Date: 09/29/2021  Expected End Date: 03/28/2022  Priority: High  Note:   Current Barriers:  Knowledge Deficits related to plan of  care for management of HTN, DMII, and Dementia (Alzheimer's Disorder)  Cognitive Deficits- pt on aricept, "everything about the same" per spouse Spoke with patient and her spouse Leah Payne, Mr. Moor reports pt has PCS services 2.5 hours per day 5 days per week and they had issues with caregiver so she quit last week and agency is sending a new aide that is to start today at 11 am, spouse reports pt still has [facility] OT and PT and in process of getting pt a new rolling walker because her's is broken.   Patient does not monitor blood pressure and has not been monitoring blood sugar  RNCM Clinical Goal(s):  Patient will verbalize understanding of plan for management of HTN, DMII, and Dementia as evidenced by patient, caregiver report and  through collaboration with RN Care manager, provider, and care team.   Interventions: 1:1 collaboration with primary care provider regarding development and update of comprehensive plan of care as evidenced by provider attestation and co-signature Inter-disciplinary care team collaboration (see longitudinal plan of care) Evaluation of current treatment plan related to  self management and patient's adherence to plan as established by provider   Diabetes Interventions:  (Status:  New goal. and Goal on track:  Yes.) Long Term Goal Assessed patient's understanding of A1c goal: <7% Provided education to patient about basic DM disease process Reviewed medications with patient and discussed importance of medication adherence Discussed plans with patient for ongoing care management follow up and provided patient with direct contact information for care management team Review of patient status, including review of consultants reports, relevant laboratory and other test results, and medications completed Reviewed importance of monitoring blood sugar Lab Results  Component Value Date   HGBA1C 6.0 (A) 10/21/2020   Dementia (Alzheimer's  Disorder) / Falls: (Status:  Goal on track:  Yes.)  Long Term Goal Evaluation of current treatment plan related to Dementia, Cognitive Deficits self-management and patient's adherence to plan as established by provider. Discussed plans with patient for ongoing care management follow up and provided patient with direct contact information for care management team Evaluation of current treatment plan related to Alzheimer's Disorder and patient's adherence to plan as established by provider Discussed plans with patient for ongoing care management follow up and provided patient with direct contact information for care management team Reinforced tips for everyday care of patients with dementia: keep a routine, such as bathing dressing eating at the same time each day, plan activities that the person enjoys and try to do them at the same time each day, serve meals in a consistent, familiar place and give patient enough time to eat, encourage use of loose-fitting, comfortable, easy to use clothing such as, elastic waistbands, fabric fasteners, or large zippers pulls instead of shoelaces, buttons, or buckles.  Reviewed safety precautions, importance of using walker  Ask caregiver (spouse) to follow up with home health today (pt has visit with OT) about walker that has been ordered  Hypertension Interventions:  (Status:  New goal. and Goal on track:  Yes.) Long Term Goal Last practice recorded BP readings:  BP Readings from  Last 3 Encounters:  09/10/21 140/86  06/10/21 135/84  05/21/21 (!) 148/89  Most recent eGFR/CrCl: No results found for: EGFR  No components found for: CRCL  Evaluation of current treatment plan related to hypertension self management and patient's adherence to plan as established by provider Reviewed medications with patient and discussed importance of compliance Advised patient, providing education and rationale, to monitor blood pressure daily and record, calling PCP for findings  outside established parameters  Reviewed importance of adhering to low sodium diet  Patient Goals/Self-Care Activities: Take medications as prescribed   Attend all scheduled provider appointments Call pharmacy for medication refills 3-7 days in advance of running out of medications Call provider office for new concerns or questions  check blood sugar at prescribed times: per doctor's order  check feet daily for cuts, sores or redness enter blood sugar readings and medication or insulin into daily log take the blood sugar log to all doctor visits take the blood sugar meter to all doctor visits read food labels for fat, fiber, carbohydrates and portion size check blood pressure weekly choose a place to take my blood pressure (home, clinic or office, retail store) write blood pressure results in a log or diary take blood pressure log to all doctor appointments Follow low sodium diet Always ask for assistance as needed Follow up with home health agency about your new walker fall prevention strategies: change position slowly, use assistive device such as walker or cane (per provider recommendations) when walking, keep walkways clear, have good lighting in room. It is important to contact your provider if you have any falls, maintain muscle strength/tone by exercise per provider recommendations. For patients with dementia: keep a routine, such as bathing dressing eating at the same time each day, plan activities that the person enjoys and try to do them at the same time each day, serve meals in a consistent, familiar place and give patient enough time to eat, encourage use of loose-fitting, comfortable, easy to use clothing such as, elastic waistbands, fabric fasteners, or large zippers pulls instead of shoelaces, buttons, or buckles.        Plan:Telephone follow up appointment with care management team member scheduled for:  11/03/21  Jacqlyn Larsen [facility], BSN RN Case Manager [address] [phone]

## 2021-09-30 ENCOUNTER — Other Ambulatory Visit: Payer: Self-pay | Admitting: Family Medicine

## 2021-10-01 ENCOUNTER — Other Ambulatory Visit: Payer: Self-pay | Admitting: Family Medicine

## 2021-10-04 DIAGNOSIS — G309 Alzheimer's disease, unspecified: Secondary | ICD-10-CM | POA: Diagnosis not present

## 2021-10-04 DIAGNOSIS — E785 Hyperlipidemia, unspecified: Secondary | ICD-10-CM | POA: Diagnosis not present

## 2021-10-04 DIAGNOSIS — I10 Essential (primary) hypertension: Secondary | ICD-10-CM | POA: Diagnosis not present

## 2021-10-04 DIAGNOSIS — E1169 Type 2 diabetes mellitus with other specified complication: Secondary | ICD-10-CM | POA: Diagnosis not present

## 2021-10-04 DIAGNOSIS — E039 Hypothyroidism, unspecified: Secondary | ICD-10-CM | POA: Diagnosis not present

## 2021-10-04 DIAGNOSIS — R471 Dysarthria and anarthria: Secondary | ICD-10-CM | POA: Diagnosis not present

## 2021-10-04 DIAGNOSIS — E1140 Type 2 diabetes mellitus with diabetic neuropathy, unspecified: Secondary | ICD-10-CM | POA: Diagnosis not present

## 2021-10-04 DIAGNOSIS — R5381 Other malaise: Secondary | ICD-10-CM | POA: Diagnosis not present

## 2021-10-05 DIAGNOSIS — E1169 Type 2 diabetes mellitus with other specified complication: Secondary | ICD-10-CM | POA: Diagnosis not present

## 2021-10-05 DIAGNOSIS — E039 Hypothyroidism, unspecified: Secondary | ICD-10-CM | POA: Diagnosis not present

## 2021-10-05 DIAGNOSIS — R471 Dysarthria and anarthria: Secondary | ICD-10-CM | POA: Diagnosis not present

## 2021-10-05 DIAGNOSIS — R5381 Other malaise: Secondary | ICD-10-CM | POA: Diagnosis not present

## 2021-10-05 DIAGNOSIS — E785 Hyperlipidemia, unspecified: Secondary | ICD-10-CM | POA: Diagnosis not present

## 2021-10-05 DIAGNOSIS — I10 Essential (primary) hypertension: Secondary | ICD-10-CM | POA: Diagnosis not present

## 2021-10-05 DIAGNOSIS — E1140 Type 2 diabetes mellitus with diabetic neuropathy, unspecified: Secondary | ICD-10-CM | POA: Diagnosis not present

## 2021-10-05 DIAGNOSIS — G309 Alzheimer's disease, unspecified: Secondary | ICD-10-CM | POA: Diagnosis not present

## 2021-10-06 DIAGNOSIS — E1169 Type 2 diabetes mellitus with other specified complication: Secondary | ICD-10-CM | POA: Diagnosis not present

## 2021-10-06 DIAGNOSIS — R471 Dysarthria and anarthria: Secondary | ICD-10-CM | POA: Diagnosis not present

## 2021-10-06 DIAGNOSIS — G309 Alzheimer's disease, unspecified: Secondary | ICD-10-CM | POA: Diagnosis not present

## 2021-10-06 DIAGNOSIS — E1140 Type 2 diabetes mellitus with diabetic neuropathy, unspecified: Secondary | ICD-10-CM | POA: Diagnosis not present

## 2021-10-06 DIAGNOSIS — R5381 Other malaise: Secondary | ICD-10-CM | POA: Diagnosis not present

## 2021-10-06 DIAGNOSIS — E785 Hyperlipidemia, unspecified: Secondary | ICD-10-CM | POA: Diagnosis not present

## 2021-10-06 DIAGNOSIS — I10 Essential (primary) hypertension: Secondary | ICD-10-CM | POA: Diagnosis not present

## 2021-10-06 DIAGNOSIS — E039 Hypothyroidism, unspecified: Secondary | ICD-10-CM | POA: Diagnosis not present

## 2021-10-08 DIAGNOSIS — R5381 Other malaise: Secondary | ICD-10-CM | POA: Diagnosis not present

## 2021-10-08 DIAGNOSIS — E1169 Type 2 diabetes mellitus with other specified complication: Secondary | ICD-10-CM | POA: Diagnosis not present

## 2021-10-08 DIAGNOSIS — G309 Alzheimer's disease, unspecified: Secondary | ICD-10-CM | POA: Diagnosis not present

## 2021-10-08 DIAGNOSIS — R471 Dysarthria and anarthria: Secondary | ICD-10-CM | POA: Diagnosis not present

## 2021-10-08 DIAGNOSIS — I10 Essential (primary) hypertension: Secondary | ICD-10-CM | POA: Diagnosis not present

## 2021-10-08 DIAGNOSIS — E785 Hyperlipidemia, unspecified: Secondary | ICD-10-CM | POA: Diagnosis not present

## 2021-10-08 DIAGNOSIS — E1140 Type 2 diabetes mellitus with diabetic neuropathy, unspecified: Secondary | ICD-10-CM | POA: Diagnosis not present

## 2021-10-08 DIAGNOSIS — E039 Hypothyroidism, unspecified: Secondary | ICD-10-CM | POA: Diagnosis not present

## 2021-10-11 DIAGNOSIS — E039 Hypothyroidism, unspecified: Secondary | ICD-10-CM | POA: Diagnosis not present

## 2021-10-11 DIAGNOSIS — I10 Essential (primary) hypertension: Secondary | ICD-10-CM | POA: Diagnosis not present

## 2021-10-11 DIAGNOSIS — R5381 Other malaise: Secondary | ICD-10-CM | POA: Diagnosis not present

## 2021-10-11 DIAGNOSIS — E1169 Type 2 diabetes mellitus with other specified complication: Secondary | ICD-10-CM | POA: Diagnosis not present

## 2021-10-11 DIAGNOSIS — R471 Dysarthria and anarthria: Secondary | ICD-10-CM | POA: Diagnosis not present

## 2021-10-11 DIAGNOSIS — G309 Alzheimer's disease, unspecified: Secondary | ICD-10-CM | POA: Diagnosis not present

## 2021-10-11 DIAGNOSIS — E1140 Type 2 diabetes mellitus with diabetic neuropathy, unspecified: Secondary | ICD-10-CM | POA: Diagnosis not present

## 2021-10-11 DIAGNOSIS — E785 Hyperlipidemia, unspecified: Secondary | ICD-10-CM | POA: Diagnosis not present

## 2021-10-12 DIAGNOSIS — I10 Essential (primary) hypertension: Secondary | ICD-10-CM | POA: Diagnosis not present

## 2021-10-12 DIAGNOSIS — G309 Alzheimer's disease, unspecified: Secondary | ICD-10-CM | POA: Diagnosis not present

## 2021-10-12 DIAGNOSIS — E1169 Type 2 diabetes mellitus with other specified complication: Secondary | ICD-10-CM | POA: Diagnosis not present

## 2021-10-12 DIAGNOSIS — E1140 Type 2 diabetes mellitus with diabetic neuropathy, unspecified: Secondary | ICD-10-CM | POA: Diagnosis not present

## 2021-10-12 DIAGNOSIS — E785 Hyperlipidemia, unspecified: Secondary | ICD-10-CM | POA: Diagnosis not present

## 2021-10-12 DIAGNOSIS — E039 Hypothyroidism, unspecified: Secondary | ICD-10-CM | POA: Diagnosis not present

## 2021-10-12 DIAGNOSIS — Z9181 History of falling: Secondary | ICD-10-CM | POA: Diagnosis not present

## 2021-10-12 DIAGNOSIS — R471 Dysarthria and anarthria: Secondary | ICD-10-CM | POA: Diagnosis not present

## 2021-10-12 DIAGNOSIS — R5381 Other malaise: Secondary | ICD-10-CM | POA: Diagnosis not present

## 2021-10-15 DIAGNOSIS — R471 Dysarthria and anarthria: Secondary | ICD-10-CM | POA: Diagnosis not present

## 2021-10-15 DIAGNOSIS — E039 Hypothyroidism, unspecified: Secondary | ICD-10-CM | POA: Diagnosis not present

## 2021-10-15 DIAGNOSIS — E1169 Type 2 diabetes mellitus with other specified complication: Secondary | ICD-10-CM | POA: Diagnosis not present

## 2021-10-15 DIAGNOSIS — I10 Essential (primary) hypertension: Secondary | ICD-10-CM | POA: Diagnosis not present

## 2021-10-15 DIAGNOSIS — E785 Hyperlipidemia, unspecified: Secondary | ICD-10-CM | POA: Diagnosis not present

## 2021-10-15 DIAGNOSIS — E1140 Type 2 diabetes mellitus with diabetic neuropathy, unspecified: Secondary | ICD-10-CM | POA: Diagnosis not present

## 2021-10-15 DIAGNOSIS — R5381 Other malaise: Secondary | ICD-10-CM | POA: Diagnosis not present

## 2021-10-15 DIAGNOSIS — G309 Alzheimer's disease, unspecified: Secondary | ICD-10-CM | POA: Diagnosis not present

## 2021-10-18 DIAGNOSIS — E1140 Type 2 diabetes mellitus with diabetic neuropathy, unspecified: Secondary | ICD-10-CM | POA: Diagnosis not present

## 2021-10-18 DIAGNOSIS — R471 Dysarthria and anarthria: Secondary | ICD-10-CM | POA: Diagnosis not present

## 2021-10-18 DIAGNOSIS — E1169 Type 2 diabetes mellitus with other specified complication: Secondary | ICD-10-CM | POA: Diagnosis not present

## 2021-10-18 DIAGNOSIS — G309 Alzheimer's disease, unspecified: Secondary | ICD-10-CM | POA: Diagnosis not present

## 2021-10-18 DIAGNOSIS — E039 Hypothyroidism, unspecified: Secondary | ICD-10-CM | POA: Diagnosis not present

## 2021-10-18 DIAGNOSIS — I10 Essential (primary) hypertension: Secondary | ICD-10-CM | POA: Diagnosis not present

## 2021-10-18 DIAGNOSIS — R5381 Other malaise: Secondary | ICD-10-CM | POA: Diagnosis not present

## 2021-10-18 DIAGNOSIS — E785 Hyperlipidemia, unspecified: Secondary | ICD-10-CM | POA: Diagnosis not present

## 2021-10-20 ENCOUNTER — Other Ambulatory Visit: Payer: Self-pay | Admitting: Family Medicine

## 2021-10-21 DIAGNOSIS — E039 Hypothyroidism, unspecified: Secondary | ICD-10-CM | POA: Diagnosis not present

## 2021-10-21 DIAGNOSIS — R5381 Other malaise: Secondary | ICD-10-CM | POA: Diagnosis not present

## 2021-10-21 DIAGNOSIS — I10 Essential (primary) hypertension: Secondary | ICD-10-CM | POA: Diagnosis not present

## 2021-10-21 DIAGNOSIS — E1140 Type 2 diabetes mellitus with diabetic neuropathy, unspecified: Secondary | ICD-10-CM | POA: Diagnosis not present

## 2021-10-21 DIAGNOSIS — G309 Alzheimer's disease, unspecified: Secondary | ICD-10-CM | POA: Diagnosis not present

## 2021-10-21 DIAGNOSIS — E785 Hyperlipidemia, unspecified: Secondary | ICD-10-CM | POA: Diagnosis not present

## 2021-10-21 DIAGNOSIS — R471 Dysarthria and anarthria: Secondary | ICD-10-CM | POA: Diagnosis not present

## 2021-10-21 DIAGNOSIS — E1169 Type 2 diabetes mellitus with other specified complication: Secondary | ICD-10-CM | POA: Diagnosis not present

## 2021-10-22 DIAGNOSIS — G301 Alzheimer's disease with late onset: Secondary | ICD-10-CM | POA: Diagnosis not present

## 2021-10-22 DIAGNOSIS — E1142 Type 2 diabetes mellitus with diabetic polyneuropathy: Secondary | ICD-10-CM

## 2021-10-22 DIAGNOSIS — F02818 Dementia in other diseases classified elsewhere, unspecified severity, with other behavioral disturbance: Secondary | ICD-10-CM | POA: Diagnosis not present

## 2021-10-22 DIAGNOSIS — I10 Essential (primary) hypertension: Secondary | ICD-10-CM

## 2021-10-25 ENCOUNTER — Other Ambulatory Visit: Payer: Self-pay | Admitting: Family Medicine

## 2021-10-26 ENCOUNTER — Ambulatory Visit: Payer: Medicare Other | Admitting: Podiatry

## 2021-10-26 DIAGNOSIS — E1169 Type 2 diabetes mellitus with other specified complication: Secondary | ICD-10-CM | POA: Diagnosis not present

## 2021-10-26 DIAGNOSIS — E039 Hypothyroidism, unspecified: Secondary | ICD-10-CM | POA: Diagnosis not present

## 2021-10-26 DIAGNOSIS — R5381 Other malaise: Secondary | ICD-10-CM | POA: Diagnosis not present

## 2021-10-26 DIAGNOSIS — R471 Dysarthria and anarthria: Secondary | ICD-10-CM | POA: Diagnosis not present

## 2021-10-26 DIAGNOSIS — E785 Hyperlipidemia, unspecified: Secondary | ICD-10-CM | POA: Diagnosis not present

## 2021-10-26 DIAGNOSIS — G309 Alzheimer's disease, unspecified: Secondary | ICD-10-CM | POA: Diagnosis not present

## 2021-10-26 DIAGNOSIS — E1140 Type 2 diabetes mellitus with diabetic neuropathy, unspecified: Secondary | ICD-10-CM | POA: Diagnosis not present

## 2021-10-26 DIAGNOSIS — I10 Essential (primary) hypertension: Secondary | ICD-10-CM | POA: Diagnosis not present

## 2021-11-01 DIAGNOSIS — I10 Essential (primary) hypertension: Secondary | ICD-10-CM | POA: Diagnosis not present

## 2021-11-01 DIAGNOSIS — G309 Alzheimer's disease, unspecified: Secondary | ICD-10-CM | POA: Diagnosis not present

## 2021-11-01 DIAGNOSIS — E1169 Type 2 diabetes mellitus with other specified complication: Secondary | ICD-10-CM | POA: Diagnosis not present

## 2021-11-01 DIAGNOSIS — E785 Hyperlipidemia, unspecified: Secondary | ICD-10-CM | POA: Diagnosis not present

## 2021-11-01 DIAGNOSIS — R5381 Other malaise: Secondary | ICD-10-CM | POA: Diagnosis not present

## 2021-11-01 DIAGNOSIS — E1140 Type 2 diabetes mellitus with diabetic neuropathy, unspecified: Secondary | ICD-10-CM | POA: Diagnosis not present

## 2021-11-01 DIAGNOSIS — R471 Dysarthria and anarthria: Secondary | ICD-10-CM | POA: Diagnosis not present

## 2021-11-01 DIAGNOSIS — E039 Hypothyroidism, unspecified: Secondary | ICD-10-CM | POA: Diagnosis not present

## 2021-11-03 ENCOUNTER — Ambulatory Visit (INDEPENDENT_AMBULATORY_CARE_PROVIDER_SITE_OTHER): Payer: Medicare Other | Admitting: *Deleted

## 2021-11-03 DIAGNOSIS — E1142 Type 2 diabetes mellitus with diabetic polyneuropathy: Secondary | ICD-10-CM

## 2021-11-03 DIAGNOSIS — F02818 Dementia in other diseases classified elsewhere, unspecified severity, with other behavioral disturbance: Secondary | ICD-10-CM

## 2021-11-03 DIAGNOSIS — F0280 Dementia in other diseases classified elsewhere without behavioral disturbance: Secondary | ICD-10-CM

## 2021-11-03 NOTE — Patient Instructions (Signed)
Visit Information ? ?Thank you for taking time to visit with me today. Please don't hesitate to contact me if I can be of assistance to you before our next scheduled telephone appointment. ? ?Following are the goals we discussed today:  ?Take medications as prescribed   ?Attend all scheduled provider appointments ?Call pharmacy for medication refills 3-7 days in advance of running out of medications ?Call provider office for new concerns or questions  ?check blood sugar at prescribed times: per doctor's order  ?check feet daily for cuts, sores or redness ?enter blood sugar readings and medication or insulin into daily log ?take the blood sugar log to all doctor visits ?take the blood sugar meter to all doctor visits ?fill half of plate with vegetables ?prepare main meal at home 3 to 5 days each week ?read food labels for fat, fiber, carbohydrates and portion size ?keep feet up while sitting ?check blood pressure weekly ?choose a place to take my blood pressure (home, clinic or office, retail store) ?write blood pressure results in a log or diary ?take blood pressure log to all doctor appointments ?take medications for blood pressure exactly as prescribed ?Follow low sodium diet- read food labels ?Always ask for assistance as needed ?Please use walker and wear eye glasses, give patient reminders ?fall prevention strategies: change position slowly, use assistive device such as walker or cane (per provider recommendations) when walking, keep walkways clear, have good lighting in room. It is important to contact your provider if you have any falls, maintain muscle strength/tone by exercise per provider recommendations. ?For patients with dementia: keep a routine, such as bathing dressing eating at the same time each day, plan activities that the person enjoys and try to do them at the same time each day, serve meals in a consistent, familiar place and give patient enough time to eat, encourage use of loose-fitting,  comfortable, easy to use clothing such as, elastic waistbands, fabric fasteners, or large zippers pulls instead of shoelaces, buttons, or buckles.  ? ?Our next appointment is by telephone on 01/26/22 at 9 am ? ?Please call the care guide team at [phone] if you need to cancel or reschedule your appointment.  ? ?If you are experiencing a Mental Health or Behavioral Health Crisis or need someone to talk to, please call the Suicide and Crisis Lifeline: 988 ?call the [place] National Suicide Prevention Lifeline: (646) 042-9598 or TTY: (629)772-6824 TTY (667)799-8382) to talk to a trained counselor ?call 1-800-273-TALK (toll free, 24 hour hotline) ?go to [facility] [address] [place] [phone]) ?call the [facility]: [phone] ?call 911  ? ?The patient verbalized understanding of instructions, educational materials, and care plan provided today and declined offer to receive copy of patient instructions, educational materials, and care plan.  ? ?Irving Shows RNC, BSN ?RN Case Manager ?[facility] ?[phone] ? ?

## 2021-11-03 NOTE — Chronic Care Management (AMB) (Signed)
?Chronic Care Management  ? ?CCM RN Visit Note ? ?11/03/2021 ?Name: Leah Payne MRN: [id number] DOB: Jun 10, 1946 ? ?Subjective: ?Leah Payne is a 76 y.o. year old female who is a primary care patient of [name], DO. The care management team was consulted for assistance with disease management and care coordination needs.   ? ?Engaged with patient by telephone for follow up visit in response to provider referral for case management and/or care coordination services.  ? ?Consent to Services:  ?The patient was given information about Chronic Care Management services, agreed to services, and gave verbal consent prior to initiation of services.  Please see initial visit note for detailed documentation.  ? ?Patient agreed to services and verbal consent obtained.  ? ?Assessment: Review of patient past medical history, allergies, medications, health status, including review of consultants reports, laboratory and other test data, was performed as part of comprehensive evaluation and provision of chronic care management services.  ? ?SDOH (Social Determinants of Health) assessments and interventions performed:   ? ?CCM Care Plan ? ?No Known Allergies ? ?Outpatient Encounter Medications as of 11/03/2021  ?Medication Sig  ? atorvastatin (LIPITOR) 40 MG tablet TAKE ONE TABLET BY MOUTH EVERY EVENING  ? Cholecalciferol (VITAMIN D) 50 MCG (2000 UT) CAPS Take 1 capsule (2,000 Units total) by mouth daily.  ? donepezil (ARICEPT) 10 MG tablet TAKE ONE TABLET BY MOUTH EVERY MORNING  ? fesoterodine ([name]) 8 MG TB24 tablet Take 1 tablet (8 mg total) by mouth daily.  ? levETIRAcetam (KEPPRA) 500 MG tablet TAKE ONE TABLET BY MOUTH TWICE DAILY  ? levothyroxine (SYNTHROID) 25 MCG tablet TAKE ONE TABLET BY MOUTH BEFORE BREAKFAST  ? loperamide (IMODIUM A-D) 2 MG capsule Take 1 capsule (2 mg total) by mouth as needed for diarrhea or loose stools.  ? metFORMIN (GLUCOPHAGE) 500 MG tablet TAKE ONE TABLET BY MOUTH EVERY MORNING  ?  ondansetron (ZOFRAN) 4 MG tablet TAKE ONE TABLET BY MOUTH every EIGHT hours as needed FOR nausea OR vomiting  ? propranolol (INDERAL) 10 MG tablet TAKE ONE TABLET BY MOUTH TWICE DAILY  ? sertraline (ZOLOFT) 100 MG tablet TAKE ONE TABLET BY MOUTH EVERY MORNING  ? Accu-Chek FastClix Lancets MISC Use as instructed to check blood sugar once daily (Patient not taking: Reported on 11/03/2021)  ? Alcohol Swabs (B-D SINGLE USE SWABS REGULAR) PADS Use as instructed to check blood sugar once daily (Patient not taking: Reported on 11/03/2021)  ? Blood Glucose Monitoring Suppl (ACCU-CHEK GUIDE) w/Device KIT 1 each by Does not apply route as directed. Use as instructed to check blood sugar once daily (Patient not taking: Reported on 11/03/2021)  ? glucose blood (ACCU-CHEK GUIDE) test strip Use as instructed to check blood sugar once daily (Patient not taking: Reported on 11/03/2021)  ? ?No facility-administered encounter medications on file as of 11/03/2021.  ? ? ?Patient Active Problem List  ? Diagnosis Date Noted  ? Asymmetrical hearing loss of right ear 02/25/2020  ? Vitamin D deficiency 03/21/2017  ? Advanced care planning/counseling discussion 09/23/2016  ? Alzheimer disease ([place]) 12/17/2015  ? WPW (Wolff-Parkinson-White syndrome)   ? 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 Hypothyroidism 09/17/2015  ? Debility   ? Overactive bladder 08/29/2014  ? Seizure disorder ([place]) 08/04/2013  ? 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[place]) 08/04/2013  ? Essential hypertension 08/04/2013  ? Hyperlipidemia associated with type 2 diabetes mellitus ([place]) 08/04/2013  ? ? ?Conditions to be addressed/monitored:DMII and Dementia ? ?Care Plan :  RN Care Manager Plan of Care  ?Updates made by Kassie Mends, RN since 11/03/2021 12:00 AM  ?  ? ?Problem: No plan of care established for management of chronic disease state  (HTN, DM2, Dementia, Falls)   ?Priority: High  ?  ? ?Long-Range Goal: Development of plan of care for chronic  disease management  (HTN, DM2, Dementia, Falls)   ?Start Date: 09/29/2021  ?Expected End Date: 03/28/2022  ?Priority: High  ?Note:   ?Current Barriers:  ?Knowledge Deficits related to plan of care for management of HTN, DMII, and Dementia (Alzheimer's Disorder)  ?Cognitive Deficits- pt on aricept, "everything about the same" per spouse ?Spoke with patient and her spouse [name], Mr. Morlock reports pt has PCS services 2.5 hours per day 5 days per week and now has new aide, spouse reports pt still has [facility] home [facility] services OT and PT and got new walker and sometimes patient refuses (or forgets) to use the walker and wear her eye glasses. ?Patient does not monitor blood pressure and has not been monitoring blood sugar ? ?RNCM Clinical Goal(s):  ?Patient will verbalize understanding of plan for management of HTN, DMII, and Dementia as evidenced by patient, caregiver report and  through collaboration with RN Care manager, provider, and care team.  ? ?Interventions: ?1:1 collaboration with primary care provider regarding development and update of comprehensive plan of care as evidenced by provider attestation and co-signature ?Inter-disciplinary care team collaboration (see longitudinal plan of care) ?Evaluation of current treatment plan related to  self management and patient's adherence to plan as established by provider ? ? ?Diabetes Interventions:  (Status:  New goal. and Goal on track:  Yes.) Long Term Goal ?Assessed patient's understanding of A1c goal: <7% ?Provided education to patient about basic DM disease process ?Reviewed medications with patient and discussed importance of medication adherence ?Discussed plans with patient for ongoing care management follow up and provided patient with direct contact information for care management team ?Review of patient status, including review of consultants reports, relevant laboratory and other test results, and medications completed ?Reinforced importance of  monitoring blood sugar ?Reviewed importance of eliminating cookies, candy, peppermints, etc from diet ?Reviewed nutritional food choices ?Lab Results  ?Component Value Date  ? HGBA1C 6.0 (A) 10/21/2020  ? ?Dementia (Alzheimer's Disorder) / Falls: (Status:  Goal on track:  Yes.)  Long Term Goal ?Evaluation of current treatment plan related to Dementia, Cognitive Deficits self-management and patient's adherence to plan as established by provider. ?Discussed plans with patient for ongoing care management follow up and provided patient with direct contact information for care management team ?Evaluation of current treatment plan related to Alzheimer's Disorder and patient's adherence to plan as established by provider ?Reviewed tips for everyday care of patients with dementia: keep a routine, such as bathing dressing eating at the same time each day, plan activities that the person enjoys and try to do them at the same time each day, serve meals in a consistent, familiar place and give patient enough time to eat, encourage use of loose-fitting, comfortable, easy to use clothing such as, elastic waistbands, fabric fasteners, or large zippers pulls instead of shoelaces, buttons, or buckles.  ?Reinforced safety precautions, importance of using walker at all times and wearing eye glasses, give patient reminders ? ?Hypertension Interventions:  (Status:  New goal. and Goal on track:  Yes.) Long Term Goal ?Last practice recorded BP readings:  ?BP Readings from Last 3 Encounters:  ?09/10/21 140/86  ?06/10/21 135/84  ?05/21/21 Marland Kitchen)  148/89  ?Most recent eGFR/CrCl: No results found for: EGFR  No components found for: CRCL ? ?Evaluation of current treatment plan related to hypertension self management and patient's adherence to plan as established by provider ?Reviewed medications with patient and discussed importance of compliance ?Advised patient, providing education and rationale, to monitor blood pressure daily and record,  calling PCP for findings outside established parameters  ?Reinforced importance of adhering to low sodium diet ? ?Patient Goals/Self-Care Activities: ?Take medications as prescribed   ?Attend all scheduled provid

## 2021-11-10 DIAGNOSIS — I10 Essential (primary) hypertension: Secondary | ICD-10-CM | POA: Diagnosis not present

## 2021-11-10 DIAGNOSIS — E785 Hyperlipidemia, unspecified: Secondary | ICD-10-CM | POA: Diagnosis not present

## 2021-11-10 DIAGNOSIS — E039 Hypothyroidism, unspecified: Secondary | ICD-10-CM | POA: Diagnosis not present

## 2021-11-10 DIAGNOSIS — R5381 Other malaise: Secondary | ICD-10-CM | POA: Diagnosis not present

## 2021-11-10 DIAGNOSIS — R471 Dysarthria and anarthria: Secondary | ICD-10-CM | POA: Diagnosis not present

## 2021-11-10 DIAGNOSIS — E1140 Type 2 diabetes mellitus with diabetic neuropathy, unspecified: Secondary | ICD-10-CM | POA: Diagnosis not present

## 2021-11-10 DIAGNOSIS — E1169 Type 2 diabetes mellitus with other specified complication: Secondary | ICD-10-CM | POA: Diagnosis not present

## 2021-11-10 DIAGNOSIS — G309 Alzheimer's disease, unspecified: Secondary | ICD-10-CM | POA: Diagnosis not present

## 2021-11-20 DIAGNOSIS — E1140 Type 2 diabetes mellitus with diabetic neuropathy, unspecified: Secondary | ICD-10-CM | POA: Diagnosis not present

## 2021-11-20 DIAGNOSIS — I10 Essential (primary) hypertension: Secondary | ICD-10-CM | POA: Diagnosis not present

## 2021-11-20 DIAGNOSIS — E1169 Type 2 diabetes mellitus with other specified complication: Secondary | ICD-10-CM | POA: Diagnosis not present

## 2021-11-20 DIAGNOSIS — R471 Dysarthria and anarthria: Secondary | ICD-10-CM | POA: Diagnosis not present

## 2021-11-20 DIAGNOSIS — E785 Hyperlipidemia, unspecified: Secondary | ICD-10-CM | POA: Diagnosis not present

## 2021-11-20 DIAGNOSIS — G309 Alzheimer's disease, unspecified: Secondary | ICD-10-CM | POA: Diagnosis not present

## 2021-11-20 DIAGNOSIS — R5381 Other malaise: Secondary | ICD-10-CM | POA: Diagnosis not present

## 2021-11-20 DIAGNOSIS — E039 Hypothyroidism, unspecified: Secondary | ICD-10-CM | POA: Diagnosis not present

## 2021-11-21 ENCOUNTER — Other Ambulatory Visit: Payer: Self-pay | Admitting: Family Medicine

## 2021-11-21 DIAGNOSIS — G301 Alzheimer's disease with late onset: Secondary | ICD-10-CM

## 2021-11-21 DIAGNOSIS — E1142 Type 2 diabetes mellitus with diabetic polyneuropathy: Secondary | ICD-10-CM

## 2021-11-21 DIAGNOSIS — G309 Alzheimer's disease, unspecified: Secondary | ICD-10-CM

## 2021-11-21 DIAGNOSIS — F0280 Dementia in other diseases classified elsewhere without behavioral disturbance: Secondary | ICD-10-CM

## 2021-11-21 DIAGNOSIS — F02818 Dementia in other diseases classified elsewhere, unspecified severity, with other behavioral disturbance: Secondary | ICD-10-CM

## 2021-11-22 ENCOUNTER — Ambulatory Visit: Payer: Medicare Other | Admitting: Podiatry

## 2021-11-23 DIAGNOSIS — E785 Hyperlipidemia, unspecified: Secondary | ICD-10-CM | POA: Diagnosis not present

## 2021-11-23 DIAGNOSIS — E039 Hypothyroidism, unspecified: Secondary | ICD-10-CM | POA: Diagnosis not present

## 2021-11-23 DIAGNOSIS — R2681 Unsteadiness on feet: Secondary | ICD-10-CM | POA: Diagnosis not present

## 2021-11-23 DIAGNOSIS — E1140 Type 2 diabetes mellitus with diabetic neuropathy, unspecified: Secondary | ICD-10-CM | POA: Diagnosis not present

## 2021-11-23 DIAGNOSIS — R5381 Other malaise: Secondary | ICD-10-CM | POA: Diagnosis not present

## 2021-11-23 DIAGNOSIS — R471 Dysarthria and anarthria: Secondary | ICD-10-CM | POA: Diagnosis not present

## 2021-11-23 DIAGNOSIS — E1169 Type 2 diabetes mellitus with other specified complication: Secondary | ICD-10-CM | POA: Diagnosis not present

## 2021-11-23 DIAGNOSIS — G309 Alzheimer's disease, unspecified: Secondary | ICD-10-CM | POA: Diagnosis not present

## 2021-11-25 ENCOUNTER — Other Ambulatory Visit: Payer: Self-pay | Admitting: Family Medicine

## 2021-11-25 NOTE — Telephone Encounter (Signed)
Pt has new PCP.  No longer under Dr. Timoteo [facility] care. ?

## 2021-11-29 DIAGNOSIS — G309 Alzheimer's disease, unspecified: Secondary | ICD-10-CM | POA: Diagnosis not present

## 2021-11-29 DIAGNOSIS — E039 Hypothyroidism, unspecified: Secondary | ICD-10-CM | POA: Diagnosis not present

## 2021-11-29 DIAGNOSIS — E785 Hyperlipidemia, unspecified: Secondary | ICD-10-CM | POA: Diagnosis not present

## 2021-11-29 DIAGNOSIS — E1169 Type 2 diabetes mellitus with other specified complication: Secondary | ICD-10-CM | POA: Diagnosis not present

## 2021-11-29 DIAGNOSIS — E1140 Type 2 diabetes mellitus with diabetic neuropathy, unspecified: Secondary | ICD-10-CM | POA: Diagnosis not present

## 2021-11-29 DIAGNOSIS — R2681 Unsteadiness on feet: Secondary | ICD-10-CM | POA: Diagnosis not present

## 2021-11-29 DIAGNOSIS — R5381 Other malaise: Secondary | ICD-10-CM | POA: Diagnosis not present

## 2021-11-29 DIAGNOSIS — R471 Dysarthria and anarthria: Secondary | ICD-10-CM | POA: Diagnosis not present

## 2021-12-08 DIAGNOSIS — R471 Dysarthria and anarthria: Secondary | ICD-10-CM | POA: Diagnosis not present

## 2021-12-08 DIAGNOSIS — E1169 Type 2 diabetes mellitus with other specified complication: Secondary | ICD-10-CM | POA: Diagnosis not present

## 2021-12-08 DIAGNOSIS — G309 Alzheimer's disease, unspecified: Secondary | ICD-10-CM | POA: Diagnosis not present

## 2021-12-08 DIAGNOSIS — R2681 Unsteadiness on feet: Secondary | ICD-10-CM | POA: Diagnosis not present

## 2021-12-08 DIAGNOSIS — E1140 Type 2 diabetes mellitus with diabetic neuropathy, unspecified: Secondary | ICD-10-CM | POA: Diagnosis not present

## 2021-12-08 DIAGNOSIS — E039 Hypothyroidism, unspecified: Secondary | ICD-10-CM | POA: Diagnosis not present

## 2021-12-08 DIAGNOSIS — R5381 Other malaise: Secondary | ICD-10-CM | POA: Diagnosis not present

## 2021-12-08 DIAGNOSIS — E785 Hyperlipidemia, unspecified: Secondary | ICD-10-CM | POA: Diagnosis not present

## 2021-12-10 ENCOUNTER — Other Ambulatory Visit: Payer: Self-pay | Admitting: Family Medicine

## 2021-12-10 NOTE — Telephone Encounter (Signed)
Pt has new PCP

## 2021-12-14 DIAGNOSIS — E1169 Type 2 diabetes mellitus with other specified complication: Secondary | ICD-10-CM | POA: Diagnosis not present

## 2021-12-14 DIAGNOSIS — G309 Alzheimer's disease, unspecified: Secondary | ICD-10-CM | POA: Diagnosis not present

## 2021-12-14 DIAGNOSIS — E039 Hypothyroidism, unspecified: Secondary | ICD-10-CM | POA: Diagnosis not present

## 2021-12-14 DIAGNOSIS — R471 Dysarthria and anarthria: Secondary | ICD-10-CM | POA: Diagnosis not present

## 2021-12-14 DIAGNOSIS — R5381 Other malaise: Secondary | ICD-10-CM | POA: Diagnosis not present

## 2021-12-14 DIAGNOSIS — E1140 Type 2 diabetes mellitus with diabetic neuropathy, unspecified: Secondary | ICD-10-CM | POA: Diagnosis not present

## 2021-12-14 DIAGNOSIS — E785 Hyperlipidemia, unspecified: Secondary | ICD-10-CM | POA: Diagnosis not present

## 2021-12-14 DIAGNOSIS — R2681 Unsteadiness on feet: Secondary | ICD-10-CM | POA: Diagnosis not present

## 2021-12-21 DIAGNOSIS — E1169 Type 2 diabetes mellitus with other specified complication: Secondary | ICD-10-CM | POA: Diagnosis not present

## 2021-12-21 DIAGNOSIS — E039 Hypothyroidism, unspecified: Secondary | ICD-10-CM | POA: Diagnosis not present

## 2021-12-21 DIAGNOSIS — G309 Alzheimer's disease, unspecified: Secondary | ICD-10-CM | POA: Diagnosis not present

## 2021-12-21 DIAGNOSIS — R471 Dysarthria and anarthria: Secondary | ICD-10-CM | POA: Diagnosis not present

## 2021-12-21 DIAGNOSIS — E1140 Type 2 diabetes mellitus with diabetic neuropathy, unspecified: Secondary | ICD-10-CM | POA: Diagnosis not present

## 2021-12-21 DIAGNOSIS — R2681 Unsteadiness on feet: Secondary | ICD-10-CM | POA: Diagnosis not present

## 2021-12-21 DIAGNOSIS — E785 Hyperlipidemia, unspecified: Secondary | ICD-10-CM | POA: Diagnosis not present

## 2021-12-21 DIAGNOSIS — R5381 Other malaise: Secondary | ICD-10-CM | POA: Diagnosis not present

## 2021-12-24 ENCOUNTER — Other Ambulatory Visit: Payer: Self-pay | Admitting: Family Medicine

## 2021-12-28 DIAGNOSIS — E1169 Type 2 diabetes mellitus with other specified complication: Secondary | ICD-10-CM | POA: Diagnosis not present

## 2021-12-28 DIAGNOSIS — R5381 Other malaise: Secondary | ICD-10-CM | POA: Diagnosis not present

## 2021-12-28 DIAGNOSIS — E785 Hyperlipidemia, unspecified: Secondary | ICD-10-CM | POA: Diagnosis not present

## 2021-12-28 DIAGNOSIS — E1140 Type 2 diabetes mellitus with diabetic neuropathy, unspecified: Secondary | ICD-10-CM | POA: Diagnosis not present

## 2021-12-28 DIAGNOSIS — R471 Dysarthria and anarthria: Secondary | ICD-10-CM | POA: Diagnosis not present

## 2021-12-28 DIAGNOSIS — G309 Alzheimer's disease, unspecified: Secondary | ICD-10-CM | POA: Diagnosis not present

## 2021-12-28 DIAGNOSIS — R2681 Unsteadiness on feet: Secondary | ICD-10-CM | POA: Diagnosis not present

## 2021-12-28 DIAGNOSIS — E039 Hypothyroidism, unspecified: Secondary | ICD-10-CM | POA: Diagnosis not present

## 2022-01-03 DIAGNOSIS — E785 Hyperlipidemia, unspecified: Secondary | ICD-10-CM | POA: Diagnosis not present

## 2022-01-03 DIAGNOSIS — E1169 Type 2 diabetes mellitus with other specified complication: Secondary | ICD-10-CM | POA: Diagnosis not present

## 2022-01-03 DIAGNOSIS — R471 Dysarthria and anarthria: Secondary | ICD-10-CM | POA: Diagnosis not present

## 2022-01-03 DIAGNOSIS — E1140 Type 2 diabetes mellitus with diabetic neuropathy, unspecified: Secondary | ICD-10-CM | POA: Diagnosis not present

## 2022-01-03 DIAGNOSIS — E039 Hypothyroidism, unspecified: Secondary | ICD-10-CM | POA: Diagnosis not present

## 2022-01-03 DIAGNOSIS — G309 Alzheimer's disease, unspecified: Secondary | ICD-10-CM | POA: Diagnosis not present

## 2022-01-03 DIAGNOSIS — R2681 Unsteadiness on feet: Secondary | ICD-10-CM | POA: Diagnosis not present

## 2022-01-03 DIAGNOSIS — R5381 Other malaise: Secondary | ICD-10-CM | POA: Diagnosis not present

## 2022-01-05 ENCOUNTER — Other Ambulatory Visit: Payer: Self-pay | Admitting: Family Medicine

## 2022-01-11 DIAGNOSIS — R471 Dysarthria and anarthria: Secondary | ICD-10-CM | POA: Diagnosis not present

## 2022-01-11 DIAGNOSIS — R2681 Unsteadiness on feet: Secondary | ICD-10-CM | POA: Diagnosis not present

## 2022-01-11 DIAGNOSIS — R5381 Other malaise: Secondary | ICD-10-CM | POA: Diagnosis not present

## 2022-01-11 DIAGNOSIS — G309 Alzheimer's disease, unspecified: Secondary | ICD-10-CM | POA: Diagnosis not present

## 2022-01-11 DIAGNOSIS — E039 Hypothyroidism, unspecified: Secondary | ICD-10-CM | POA: Diagnosis not present

## 2022-01-11 DIAGNOSIS — E1140 Type 2 diabetes mellitus with diabetic neuropathy, unspecified: Secondary | ICD-10-CM | POA: Diagnosis not present

## 2022-01-11 DIAGNOSIS — E785 Hyperlipidemia, unspecified: Secondary | ICD-10-CM | POA: Diagnosis not present

## 2022-01-11 DIAGNOSIS — E1169 Type 2 diabetes mellitus with other specified complication: Secondary | ICD-10-CM | POA: Diagnosis not present

## 2022-01-17 DIAGNOSIS — E785 Hyperlipidemia, unspecified: Secondary | ICD-10-CM | POA: Diagnosis not present

## 2022-01-17 DIAGNOSIS — R471 Dysarthria and anarthria: Secondary | ICD-10-CM | POA: Diagnosis not present

## 2022-01-17 DIAGNOSIS — R2681 Unsteadiness on feet: Secondary | ICD-10-CM | POA: Diagnosis not present

## 2022-01-17 DIAGNOSIS — G309 Alzheimer's disease, unspecified: Secondary | ICD-10-CM | POA: Diagnosis not present

## 2022-01-17 DIAGNOSIS — R5381 Other malaise: Secondary | ICD-10-CM | POA: Diagnosis not present

## 2022-01-17 DIAGNOSIS — E1169 Type 2 diabetes mellitus with other specified complication: Secondary | ICD-10-CM | POA: Diagnosis not present

## 2022-01-17 DIAGNOSIS — E1140 Type 2 diabetes mellitus with diabetic neuropathy, unspecified: Secondary | ICD-10-CM | POA: Diagnosis not present

## 2022-01-17 DIAGNOSIS — E039 Hypothyroidism, unspecified: Secondary | ICD-10-CM | POA: Diagnosis not present

## 2022-01-18 ENCOUNTER — Ambulatory Visit (INDEPENDENT_AMBULATORY_CARE_PROVIDER_SITE_OTHER): Payer: Medicare Other | Admitting: Podiatry

## 2022-01-18 ENCOUNTER — Encounter: Payer: Self-pay | Admitting: Podiatry

## 2022-01-18 DIAGNOSIS — F0280 Dementia in other diseases classified elsewhere without behavioral disturbance: Secondary | ICD-10-CM

## 2022-01-18 DIAGNOSIS — M79674 Pain in right toe(s): Secondary | ICD-10-CM | POA: Diagnosis not present

## 2022-01-18 DIAGNOSIS — G309 Alzheimer's disease, unspecified: Secondary | ICD-10-CM

## 2022-01-18 DIAGNOSIS — B351 Tinea unguium: Secondary | ICD-10-CM

## 2022-01-18 DIAGNOSIS — E1142 Type 2 diabetes mellitus with diabetic polyneuropathy: Secondary | ICD-10-CM | POA: Diagnosis not present

## 2022-01-18 DIAGNOSIS — M79675 Pain in left toe(s): Secondary | ICD-10-CM

## 2022-01-19 ENCOUNTER — Other Ambulatory Visit: Payer: Self-pay | Admitting: Family Medicine

## 2022-01-24 DIAGNOSIS — R2681 Unsteadiness on feet: Secondary | ICD-10-CM | POA: Diagnosis not present

## 2022-01-24 DIAGNOSIS — G912 (Idiopathic) normal pressure hydrocephalus: Secondary | ICD-10-CM | POA: Diagnosis not present

## 2022-01-24 DIAGNOSIS — G309 Alzheimer's disease, unspecified: Secondary | ICD-10-CM | POA: Diagnosis not present

## 2022-01-24 DIAGNOSIS — E1140 Type 2 diabetes mellitus with diabetic neuropathy, unspecified: Secondary | ICD-10-CM | POA: Diagnosis not present

## 2022-01-24 DIAGNOSIS — Z7984 Long term (current) use of oral hypoglycemic drugs: Secondary | ICD-10-CM | POA: Diagnosis not present

## 2022-01-24 DIAGNOSIS — I10 Essential (primary) hypertension: Secondary | ICD-10-CM | POA: Diagnosis not present

## 2022-01-24 DIAGNOSIS — R5381 Other malaise: Secondary | ICD-10-CM | POA: Diagnosis not present

## 2022-01-24 DIAGNOSIS — R471 Dysarthria and anarthria: Secondary | ICD-10-CM | POA: Diagnosis not present

## 2022-01-25 ENCOUNTER — Other Ambulatory Visit: Payer: Self-pay | Admitting: Family Medicine

## 2022-01-26 ENCOUNTER — Ambulatory Visit (INDEPENDENT_AMBULATORY_CARE_PROVIDER_SITE_OTHER): Payer: Medicare Other | Admitting: *Deleted

## 2022-01-26 DIAGNOSIS — Z7984 Long term (current) use of oral hypoglycemic drugs: Secondary | ICD-10-CM | POA: Diagnosis not present

## 2022-01-26 DIAGNOSIS — G309 Alzheimer's disease, unspecified: Secondary | ICD-10-CM | POA: Diagnosis not present

## 2022-01-26 DIAGNOSIS — F0280 Dementia in other diseases classified elsewhere without behavioral disturbance: Secondary | ICD-10-CM

## 2022-01-26 DIAGNOSIS — R5381 Other malaise: Secondary | ICD-10-CM | POA: Diagnosis not present

## 2022-01-26 DIAGNOSIS — I10 Essential (primary) hypertension: Secondary | ICD-10-CM

## 2022-01-26 DIAGNOSIS — R41841 Cognitive communication deficit: Secondary | ICD-10-CM | POA: Diagnosis not present

## 2022-01-26 DIAGNOSIS — E1142 Type 2 diabetes mellitus with diabetic polyneuropathy: Secondary | ICD-10-CM

## 2022-01-26 DIAGNOSIS — E1140 Type 2 diabetes mellitus with diabetic neuropathy, unspecified: Secondary | ICD-10-CM | POA: Diagnosis not present

## 2022-01-26 DIAGNOSIS — R2681 Unsteadiness on feet: Secondary | ICD-10-CM | POA: Diagnosis not present

## 2022-01-26 NOTE — Chronic Care Management (AMB) (Signed)
Chronic Care Management   CCM RN Visit Note  01/26/2022 Name: Leah Payne MRN: [id number] DOB: 21-Aug-1945  Subjective: Leah Payne is a 76 y.o. year old female who is a primary care patient of Tommie Sams, DO. The care management team was consulted for assistance with disease management and care coordination needs.    Engaged with patient by telephone for follow up visit in response to provider referral for case management and/or care coordination services.   Consent to Services:  The patient was given information about Chronic Care Management services, agreed to services, and gave verbal consent prior to initiation of services.  Please see initial visit note for detailed documentation.   Patient agreed to services and verbal consent obtained.   Assessment: Review of patient past medical history, allergies, medications, health status, including review of consultants reports, laboratory and other test data, was performed as part of comprehensive evaluation and provision of chronic care management services.   SDOH (Social Determinants of Health) assessments and interventions performed:    CCM Care Plan  No Known Allergies  Outpatient Encounter Medications as of 01/26/2022  Medication Sig   atorvastatin (LIPITOR) 40 MG tablet TAKE ONE TABLET BY MOUTH EVERY EVENING   Cholecalciferol (VITAMIN D) 50 MCG (2000 UT) CAPS Take 1 capsule (2,000 Units total) by mouth daily.   donepezil (ARICEPT) 10 MG tablet TAKE ONE TABLET BY MOUTH EVERY MORNING   fesoterodine ([name]) 8 MG TB24 tablet Take 1 tablet (8 mg total) by mouth daily.   levETIRAcetam (KEPPRA) 500 MG tablet TAKE ONE TABLET BY MOUTH TWICE DAILY   levothyroxine (SYNTHROID) 25 MCG tablet TAKE ONE TABLET BY MOUTH BEFORE BREAKFAST   loperamide (IMODIUM A-D) 2 MG capsule Take 1 capsule (2 mg total) by mouth as needed for diarrhea or loose stools.   metFORMIN (GLUCOPHAGE) 500 MG tablet TAKE ONE TABLET BY MOUTH EVERY MORNING    [name] 50 MG TB24 tablet TAKE ONE TABLET BY MOUTH EVERYDAY AT BEDTIME   ondansetron (ZOFRAN) 4 MG tablet TAKE ONE TABLET BY MOUTH EVERY 8 HOURS AS NEEDED FOR NAUSEA AND VOMITING   propranolol (INDERAL) 10 MG tablet TAKE ONE TABLET BY MOUTH TWICE DAILY   sertraline (ZOLOFT) 100 MG tablet TAKE ONE TABLET BY MOUTH EVERY MORNING   Accu-Chek FastClix Lancets MISC Use as instructed to check blood sugar once daily (Patient not taking: Reported on 11/03/2021)   Alcohol Swabs (B-D SINGLE USE SWABS REGULAR) PADS Use as instructed to check blood sugar once daily (Patient not taking: Reported on 11/03/2021)   Blood Glucose Monitoring Suppl (ACCU-CHEK GUIDE) w/Device KIT 1 each by Does not apply route as directed. Use as instructed to check blood sugar once daily (Patient not taking: Reported on 11/03/2021)   glucose blood (ACCU-CHEK GUIDE) test strip Use as instructed to check blood sugar once daily (Patient not taking: Reported on 11/03/2021)   No facility-administered encounter medications on file as of 01/26/2022.    Patient Active Problem List   Diagnosis Date Noted   Pain due to onychomycosis of toenails of both feet 01/18/2022   Asymmetrical hearing loss of right ear 02/25/2020   Vitamin D deficiency 03/21/2017   Advanced care planning/counseling discussion 09/23/2016   Alzheimer disease (HCC) 12/17/2015   WPW (Wolff-Parkinson-White syndrome)    Type 2 diabetes, controlled, 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HCC) 09/17/2015   Hypothyroidism 09/17/2015   Debility    Overactive bladder 08/29/2014   Seizure disorder (HCC) 08/04/2013   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HCC) 08/04/2013  Essential hypertension 08/04/2013   Hyperlipidemia associated with type 2 diabetes mellitus ([place]) 08/04/2013    Conditions to be addressed/monitored:HTN, DMII, and Dementia  Care Plan : RN Care Manager Plan of Care  Updates made by Kassie Mends, RN since 01/26/2022 12:00 AM     Problem: No plan of care established for  management of chronic disease state  (HTN, DM2, Dementia, Falls)   Priority: High     Long-Range Goal: Development of plan of care for chronic disease management  (HTN, DM2, Dementia, Falls)   Start Date: 09/29/2021  Expected End Date: 03/28/2022  Priority: High  Note:   Current Barriers:  Knowledge Deficits related to plan of care for management of HTN, DMII, and Dementia (Alzheimer's Disorder)  Cognitive Deficits- pt on aricept, "everything about the same" per spouse Spoke with patient and her spouse [name], Mr. Lomeli reports pt continues with PCS services 2.5 hours per day 5 days per week, spouse reports pt still has [facility] home [facility] services and PT once weekly, got new walker and sometimes patient refuses (or forgets) to use the walker and wear her eye glasses. Patient does not monitor blood pressure and has not been monitoring blood sugar No new concerns or problems verbalized today  RNCM Clinical Goal(s):  Patient will verbalize understanding of plan for management of HTN, DMII, and Dementia as evidenced by patient, caregiver report and  through collaboration with RN Care manager, provider, and care team.   Interventions: 1:1 collaboration with primary care provider regarding development and update of comprehensive plan of care as evidenced by provider attestation and co-signature Inter-disciplinary care team collaboration (see longitudinal plan of care) Evaluation of current treatment plan related to  self management and patient's adherence to plan as established by provider   Diabetes Interventions:  (Status:  New goal. and Goal on track:  Yes.) Long Term Goal Assessed patient's understanding of A1c goal: <7% Provided education to patient about basic DM disease process Reviewed medications with patient and discussed importance of medication adherence Review of patient status, including review of consultants reports, relevant laboratory and other test results, and  medications completed Reviewed importance of monitoring blood sugar Reinforced importance of eliminating cookies, candy, peppermints, etc from diet Reinforced nutritional food choices Lab Results  Component Value Date   HGBA1C 6.0 (A) 10/21/2020   Dementia (Alzheimer's Disorder) / Falls: (Status:  Goal on track:  Yes.)  Long Term Goal Evaluation of current treatment plan related to Dementia, Cognitive Deficits self-management and patient's adherence to plan as established by provider. Discussed plans with patient for ongoing care management follow up and provided patient with direct contact information for care management team Evaluation of current treatment plan related to Alzheimer's Disorder and patient's adherence to plan as established by provider Reinforced tips for everyday care of patients with dementia: keep a routine, such as bathing dressing eating at the same time each day, plan activities that the person enjoys and try to do them at the same time each day, serve meals in a consistent, familiar place and give patient enough time to eat, encourage use of loose-fitting, comfortable, easy to use clothing such as, elastic waistbands, fabric fasteners, or large zippers pulls instead of shoelaces, buttons, or buckles.  Reviewed safety precautions, importance of using walker at all times and wearing eye glasses, give patient reminders Reviewed plan of care with patient's spouse Case closure today  Hypertension Interventions:  (Status:  New goal. and Goal on track:  Yes.) Long Term Goal  Last practice recorded BP readings:  BP Readings from Last 3 Encounters:  09/10/21 140/86  06/10/21 135/84  05/21/21 (!) 148/89  Most recent eGFR/CrCl: No results found for: EGFR  No components found for: CRCL  Evaluation of current treatment plan related to hypertension self management and patient's adherence to plan as established by provider Reviewed medications with patient and discussed importance  of compliance Advised patient, providing education and rationale, to monitor blood pressure daily and record, calling PCP for findings outside established parameters  Reinforced importance of adhering to low sodium diet  Patient Goals/Self-Care Activities: Take medications as prescribed   Attend all scheduled provider appointments Call pharmacy for medication refills 3-7 days in advance of running out of medications Call provider office for new concerns or questions  check blood sugar at prescribed times: per doctor's order  check feet daily for cuts, sores or redness enter blood sugar readings and medication or insulin into daily log take the blood sugar log to all doctor visits take the blood sugar meter to all doctor visits fill half of plate with vegetables prepare main meal at home 3 to 5 days each week read food labels for fat, fiber, carbohydrates and portion size do heel pump exercise 2 to 3 times each day keep feet up while sitting check blood pressure weekly choose a place to take my blood pressure (home, clinic or office, retail store) write blood pressure results in a log or diary take blood pressure log to all doctor appointments take medications for blood pressure exactly as prescribed report new symptoms to your doctor eat more whole grains, fruits and vegetables, lean meats and healthy fats Follow low sodium diet- read food labels Always ask for assistance as needed Please use walker and wear eye glasses, give patient reminders fall prevention strategies: change position slowly, use assistive device such as walker or cane (per provider recommendations) when walking, keep walkways clear, have good lighting in room. It is important to contact your provider if you have any falls, maintain muscle strength/tone by exercise per provider recommendations. For patients with dementia: keep a routine, such as bathing dressing eating at the same time each day, plan activities that  the person enjoys and try to do them at the same time each day, serve meals in a consistent, familiar place and give patient enough time to eat, encourage use of loose-fitting, comfortable, easy to use clothing such as, elastic waistbands, fabric fasteners, or large zippers pulls instead of shoelaces, buttons, or buckles.        Plan:No further follow up required: case closure today   Jacqlyn Larsen [facility], BSN RN Case Manager [place] [phone]

## 2022-01-26 NOTE — Patient Instructions (Signed)
Visit Information  Thank you for taking time to visit with me today. Please don't hesitate to contact me if I can be of assistance to you before our next scheduled telephone appointment.  Following are the goals we discussed today:  Take medications as prescribed   Attend all scheduled provider appointments Call pharmacy for medication refills 3-7 days in advance of running out of medications Call provider office for new concerns or questions  check blood sugar at prescribed times: per doctor's order  check feet daily for cuts, sores or redness enter blood sugar readings and medication or insulin into daily log take the blood sugar log to all doctor visits take the blood sugar meter to all doctor visits fill half of plate with vegetables prepare main meal at home 3 to 5 days each week read food labels for fat, fiber, carbohydrates and portion size do heel pump exercise 2 to 3 times each day keep feet up while sitting check blood pressure weekly choose a place to take my blood pressure (home, clinic or office, retail store) write blood pressure results in a log or diary take blood pressure log to all doctor appointments take medications for blood pressure exactly as prescribed report new symptoms to your doctor eat more whole grains, fruits and vegetables, lean meats and healthy fats Follow low sodium diet- read food labels Always ask for assistance as needed Please use walker and wear eye glasses, give patient reminders fall prevention strategies: change position slowly, use assistive device such as walker or cane (per provider recommendations) when walking, keep walkways clear, have good lighting in room. It is important to contact your provider if you have any falls, maintain muscle strength/tone by exercise per provider recommendations. For patients with dementia: keep a routine, such as bathing dressing eating at the same time each day, plan activities that the person enjoys and try  to do them at the same time each day, serve meals in a consistent, familiar place and give patient enough time to eat, encourage use of loose-fitting, comfortable, easy to use clothing such as, elastic waistbands, fabric fasteners, or large zippers pulls instead of shoelaces, buttons, or buckles.  Case closure today    Please call the care guide team at [phone] if you need to cancel or reschedule your appointment.   If you are experiencing a Mental Health or Behavioral Health Crisis or need someone to talk to, please call the Suicide and Crisis Lifeline: 988 call the [place] National Suicide Prevention Lifeline: (561) 551-3133 or TTY: 276-878-1094 TTY 416-158-4101) to talk to a trained counselor call 1-800-273-TALK (toll free, 24 hour hotline) go to [facility] [address] [place] [phone]) call the [facility] Line: [phone] call 911   The patient verbalized understanding of instructions, educational materials, and care plan provided today and DECLINED offer to receive copy of patient instructions, educational materials, and care plan.   Irving Shows [facility], BSN RN Case Manager [facility] [phone]

## 2022-02-01 DIAGNOSIS — E1140 Type 2 diabetes mellitus with diabetic neuropathy, unspecified: Secondary | ICD-10-CM | POA: Diagnosis not present

## 2022-02-01 DIAGNOSIS — R2681 Unsteadiness on feet: Secondary | ICD-10-CM | POA: Diagnosis not present

## 2022-02-01 DIAGNOSIS — G912 (Idiopathic) normal pressure hydrocephalus: Secondary | ICD-10-CM | POA: Diagnosis not present

## 2022-02-01 DIAGNOSIS — Z7984 Long term (current) use of oral hypoglycemic drugs: Secondary | ICD-10-CM | POA: Diagnosis not present

## 2022-02-01 DIAGNOSIS — R5381 Other malaise: Secondary | ICD-10-CM | POA: Diagnosis not present

## 2022-02-01 DIAGNOSIS — I10 Essential (primary) hypertension: Secondary | ICD-10-CM | POA: Diagnosis not present

## 2022-02-01 DIAGNOSIS — R471 Dysarthria and anarthria: Secondary | ICD-10-CM | POA: Diagnosis not present

## 2022-02-01 DIAGNOSIS — G309 Alzheimer's disease, unspecified: Secondary | ICD-10-CM | POA: Diagnosis not present

## 2022-02-08 DIAGNOSIS — R2681 Unsteadiness on feet: Secondary | ICD-10-CM | POA: Diagnosis not present

## 2022-02-08 DIAGNOSIS — G912 (Idiopathic) normal pressure hydrocephalus: Secondary | ICD-10-CM | POA: Diagnosis not present

## 2022-02-08 DIAGNOSIS — R5381 Other malaise: Secondary | ICD-10-CM | POA: Diagnosis not present

## 2022-02-08 DIAGNOSIS — E1140 Type 2 diabetes mellitus with diabetic neuropathy, unspecified: Secondary | ICD-10-CM | POA: Diagnosis not present

## 2022-02-08 DIAGNOSIS — R471 Dysarthria and anarthria: Secondary | ICD-10-CM | POA: Diagnosis not present

## 2022-02-08 DIAGNOSIS — Z7984 Long term (current) use of oral hypoglycemic drugs: Secondary | ICD-10-CM | POA: Diagnosis not present

## 2022-02-08 DIAGNOSIS — I10 Essential (primary) hypertension: Secondary | ICD-10-CM | POA: Diagnosis not present

## 2022-02-08 DIAGNOSIS — G309 Alzheimer's disease, unspecified: Secondary | ICD-10-CM | POA: Diagnosis not present

## 2022-02-14 DIAGNOSIS — T1490XA Injury, unspecified, initial encounter: Secondary | ICD-10-CM | POA: Diagnosis not present

## 2022-02-16 DIAGNOSIS — I10 Essential (primary) hypertension: Secondary | ICD-10-CM | POA: Diagnosis not present

## 2022-02-16 DIAGNOSIS — E1140 Type 2 diabetes mellitus with diabetic neuropathy, unspecified: Secondary | ICD-10-CM | POA: Diagnosis not present

## 2022-02-16 DIAGNOSIS — Z7984 Long term (current) use of oral hypoglycemic drugs: Secondary | ICD-10-CM | POA: Diagnosis not present

## 2022-02-16 DIAGNOSIS — R471 Dysarthria and anarthria: Secondary | ICD-10-CM | POA: Diagnosis not present

## 2022-02-16 DIAGNOSIS — G309 Alzheimer's disease, unspecified: Secondary | ICD-10-CM | POA: Diagnosis not present

## 2022-02-16 DIAGNOSIS — R5381 Other malaise: Secondary | ICD-10-CM | POA: Diagnosis not present

## 2022-02-16 DIAGNOSIS — R2681 Unsteadiness on feet: Secondary | ICD-10-CM | POA: Diagnosis not present

## 2022-02-16 DIAGNOSIS — G912 (Idiopathic) normal pressure hydrocephalus: Secondary | ICD-10-CM | POA: Diagnosis not present

## 2022-02-21 ENCOUNTER — Other Ambulatory Visit: Payer: Self-pay | Admitting: Family Medicine

## 2022-02-21 DIAGNOSIS — E1142 Type 2 diabetes mellitus with diabetic polyneuropathy: Secondary | ICD-10-CM

## 2022-02-21 DIAGNOSIS — I10 Essential (primary) hypertension: Secondary | ICD-10-CM

## 2022-02-21 DIAGNOSIS — F0280 Dementia in other diseases classified elsewhere without behavioral disturbance: Secondary | ICD-10-CM | POA: Diagnosis not present

## 2022-02-21 DIAGNOSIS — E039 Hypothyroidism, unspecified: Secondary | ICD-10-CM

## 2022-02-21 DIAGNOSIS — G309 Alzheimer's disease, unspecified: Secondary | ICD-10-CM | POA: Diagnosis not present

## 2022-02-21 DIAGNOSIS — Z79899 Other long term (current) drug therapy: Secondary | ICD-10-CM

## 2022-02-21 DIAGNOSIS — E1169 Type 2 diabetes mellitus with other specified complication: Secondary | ICD-10-CM

## 2022-02-23 NOTE — Telephone Encounter (Signed)
Nurses-this patient needs A1c, TSH, lipid, liver, metabolic 7, urine ACR She may have 90-day on all her meds She needs to do her labs before her visit with Dr. Adriana Simas

## 2022-02-25 ENCOUNTER — Other Ambulatory Visit: Payer: Self-pay | Admitting: Family Medicine

## 2022-02-25 DIAGNOSIS — Z79899 Other long term (current) drug therapy: Secondary | ICD-10-CM

## 2022-02-25 DIAGNOSIS — E1169 Type 2 diabetes mellitus with other specified complication: Secondary | ICD-10-CM

## 2022-02-25 DIAGNOSIS — I10 Essential (primary) hypertension: Secondary | ICD-10-CM

## 2022-02-25 DIAGNOSIS — E039 Hypothyroidism, unspecified: Secondary | ICD-10-CM

## 2022-02-25 DIAGNOSIS — E1142 Type 2 diabetes mellitus with diabetic polyneuropathy: Secondary | ICD-10-CM

## 2022-02-28 DIAGNOSIS — R471 Dysarthria and anarthria: Secondary | ICD-10-CM | POA: Diagnosis not present

## 2022-02-28 DIAGNOSIS — R5381 Other malaise: Secondary | ICD-10-CM | POA: Diagnosis not present

## 2022-02-28 DIAGNOSIS — Z7984 Long term (current) use of oral hypoglycemic drugs: Secondary | ICD-10-CM | POA: Diagnosis not present

## 2022-02-28 DIAGNOSIS — R2681 Unsteadiness on feet: Secondary | ICD-10-CM | POA: Diagnosis not present

## 2022-02-28 DIAGNOSIS — I10 Essential (primary) hypertension: Secondary | ICD-10-CM | POA: Diagnosis not present

## 2022-02-28 DIAGNOSIS — E1140 Type 2 diabetes mellitus with diabetic neuropathy, unspecified: Secondary | ICD-10-CM | POA: Diagnosis not present

## 2022-02-28 DIAGNOSIS — G309 Alzheimer's disease, unspecified: Secondary | ICD-10-CM | POA: Diagnosis not present

## 2022-02-28 DIAGNOSIS — G912 (Idiopathic) normal pressure hydrocephalus: Secondary | ICD-10-CM | POA: Diagnosis not present

## 2022-03-10 ENCOUNTER — Ambulatory Visit (INDEPENDENT_AMBULATORY_CARE_PROVIDER_SITE_OTHER): Payer: Medicare Other | Admitting: Family Medicine

## 2022-03-10 VITALS — BP 136/78 | HR 68 | Temp 97.6°F | Ht 60.0 in | Wt 132.0 lb

## 2022-03-10 DIAGNOSIS — G309 Alzheimer's disease, unspecified: Secondary | ICD-10-CM

## 2022-03-10 DIAGNOSIS — E1169 Type 2 diabetes mellitus with other specified complication: Secondary | ICD-10-CM

## 2022-03-10 DIAGNOSIS — E559 Vitamin D deficiency, unspecified: Secondary | ICD-10-CM | POA: Diagnosis not present

## 2022-03-10 DIAGNOSIS — E1142 Type 2 diabetes mellitus with diabetic polyneuropathy: Secondary | ICD-10-CM | POA: Diagnosis not present

## 2022-03-10 DIAGNOSIS — Z130 Encounter for screening for diseases of the blood and blood-forming organs and certain disorders involving the immune mechanism: Secondary | ICD-10-CM

## 2022-03-10 DIAGNOSIS — E785 Hyperlipidemia, unspecified: Secondary | ICD-10-CM

## 2022-03-10 DIAGNOSIS — E039 Hypothyroidism, unspecified: Secondary | ICD-10-CM | POA: Diagnosis not present

## 2022-03-10 DIAGNOSIS — I10 Essential (primary) hypertension: Secondary | ICD-10-CM | POA: Diagnosis not present

## 2022-03-10 DIAGNOSIS — F0280 Dementia in other diseases classified elsewhere without behavioral disturbance: Secondary | ICD-10-CM

## 2022-03-10 MED ORDER — FESOTERODINE FUMARATE ER 8 MG PO TB24: 8.0000 mg | ORAL_TABLET | Freq: Every day | ORAL | 1 refills | Status: DC

## 2022-03-10 NOTE — Assessment & Plan Note (Signed)
Stable.  Continue current medications.

## 2022-03-10 NOTE — Assessment & Plan Note (Signed)
TSH today.  Continue current dosing of Synthroid.

## 2022-03-10 NOTE — Progress Notes (Signed)
Subjective:  Patient ID: Leah Payne, female    DOB: [birth year]  Age: 76 y.o. MRN: [id number]  CC: Chief Complaint  Patient presents with   Diabetes   alzheimers    HPI:  76 year old female with hypertension, Wolff-Parkinson-White, type 2 diabetes, hyperlipidemia, hypothyroidism, Alzheimer disease, congenital hydrocephalus, seizure disorder presents for follow-up.  History is limited as her husband is not with her today.  A neighbor is with her.  Neighbor brought her to this appointment.  Patient states that she is feeling well.  Denies pain.  Denies any symptoms.  Hypertension is stable on propranolol.  This was started prior to her seeing me.  Unsure why.  Nevertheless, blood pressure is well controlled.  Hyperlipidemia has been well controlled on atorvastatin.  Last A1c was well over a year ago.  She has been stable for quite some time.  Last A1c was 6.0.  She is on metformin.  Last TSH revealed good control of hypothyroidism.  Needs labs today.  Patient Active Problem List   Diagnosis Date Noted   Asymmetrical hearing loss of right ear 02/25/2020   Vitamin D deficiency 03/21/2017   Alzheimer disease ([place]) 12/17/2015   WPW (Wolff-Parkinson-White syndrome)    Type 2 diabetes, controlled, 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[place]) 09/17/2015   Hypothyroidism 09/17/2015   Debility    Overactive bladder 08/29/2014   Seizure disorder ([place]) 08/04/2013   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[place]) 08/04/2013   Essential hypertension 08/04/2013   Hyperlipidemia associated with type 2 diabetes mellitus ([place]) 08/04/2013    Social Hx   Social History   Socioeconomic History   Marital status: Married    Spouse name: Not on file   Number of children: Not on file   Years of education: Not on file   Highest education level: High school graduate  Occupational History   Not on file  Tobacco Use   Smoking status: Never   Smokeless tobacco: Never  Vaping Use   Vaping Use: Never used   Substance and Sexual Activity   Alcohol use: No    Alcohol/week: 0.0 standard drinks of alcohol   Drug use: No   Sexual activity: Not on file  Other Topics Concern   Not on file  Social History Narrative   Lives with husband, 2 dogs. Married x 58 years in 04/2021.   Moved from [facility] [place]   Occupation: retired, was housewife   Edu: completed HS    Activity: no regular exercise. Dizziness precludes activity   Diet: good water, fruits/vegetables daily   Caffeine: 2 cups daily   Social Determinants of Health   Financial Resource Strain: Low Risk  (07/06/2021)   Overall Financial Resource Strain (CARDIA)    Difficulty of Paying Living Expenses: Not hard at all  Food Insecurity: No Food Insecurity (07/06/2021)   Hunger Vital Sign    Worried About Running Out of Food in the Last Year: Never true    Ran Out of Food in the Last Year: Never true  Transportation Needs: No Transportation Needs (07/06/2021)   PRAPARE - Hydrologist (Medical): No    Lack of Transportation (Non-Medical): No  Physical Activity: Inactive (07/06/2021)   Exercise Vital Sign    Days of Exercise per Week: 0 days    Minutes of Exercise per Session: 0 min  Stress: No Stress Concern Present (07/06/2021)   [place]    Feeling of Stress : Not at all  Social  Connections: Socially Integrated (07/06/2021)   Social Connection and Isolation Panel [NHANES]    Frequency of Communication with Friends and Family: More than three times a week    Frequency of Social Gatherings with Friends and Family: Never    Attends Religious Services: More than 4 times per year    Active Member of [employer] or Organizations: Yes    Attends Music therapist: More than 4 times per year    Marital Status: Married    Review of Systems Per HPI  Objective:  BP 136/78   Pulse 68   Temp 97.6 F (36.4 C)   Ht 5' (1.524 m)   Wt 132 lb  (59.9 kg)   SpO2 98%   BMI 25.78 kg/m      03/10/2022    9:35 AM 09/10/2021   10:12 AM 07/06/2021   10:31 AM  BP/Weight  Systolic BP 476 546   Diastolic BP 78 86   Wt. (Lbs) 132 141.8 145  BMI 25.78 kg/m2 27.69 kg/m2 28.32 kg/m2    Physical Exam Vitals and nursing note reviewed.  Constitutional:      Appearance: She is not ill-appearing.  HENT:     Head: Atraumatic.     Comments: Frontal bossing consistent with her history of hydrocephalus.  Cardiovascular:     Rate and Rhythm: Normal rate and regular rhythm.  Pulmonary:     Effort: Pulmonary effort is normal.     Breath sounds: Normal breath sounds. No wheezing or rales.  Abdominal:     General: There is no distension.     Palpations: Abdomen is soft.     Tenderness: There is no abdominal tenderness.  Neurological:     Mental Status: She is alert.  Psychiatric:        Behavior: Behavior normal.     Lab Results  Component Value Date   WBC 5.0 05/20/2021   HGB 12.5 05/20/2021   HCT 38.6 05/20/2021   PLT 244 05/20/2021   GLUCOSE 108 (H) 05/20/2021   CHOL 154 06/10/2021   TRIG 127 06/10/2021   HDL 48 06/10/2021   LDLDIRECT 100.0 09/17/2015   LDLCALC 83 06/10/2021   ALT 18 05/20/2021   AST 19 05/20/2021   NA 135 05/20/2021   K 3.9 05/20/2021   CL 103 05/20/2021   CREATININE 0.68 05/20/2021   BUN 8 05/20/2021   CO2 26 05/20/2021   TSH 2.610 06/10/2021   HGBA1C 6.0 (A) 10/21/2020   MICROALBUR <0.7 02/24/2020     Assessment & Plan:   Problem List Items Addressed This Visit       Cardiovascular and Mediastinum   Essential hypertension    Stable.  Continue current medications.        Endocrine   Hyperlipidemia associated with type 2 diabetes mellitus (HCC)    Continue statin.  Lipid panel today.      Relevant Orders   Lipid panel   Hypothyroidism - Primary    TSH today.  Continue current dosing of Synthroid.      Relevant Orders   TSH   Type 2 diabetes, controlled, with peripheral  neuropathy ([place])    Well-controlled on metformin.  Continue.  A1c today.      Relevant Orders   CMP14+EGFR   Hemoglobin A1c     Nervous and Auditory   Alzheimer disease ([place])    Patient appears at her baseline.  Continue Aricept.        Other   Vitamin D deficiency  Relevant Orders   Vitamin D, 25-hydroxy   Other Visit Diagnoses     Screening for deficiency anemia       Relevant Orders   CBC       Meds ordered this encounter  Medications   fesoterodine ([name]) 8 MG TB24 tablet    Sig: Take 1 tablet (8 mg total) by mouth daily.    Dispense:  90 tablet    Refill:  1    Follow-up:  Return in about 6 months (around 09/10/2022).  [place]

## 2022-03-10 NOTE — Assessment & Plan Note (Signed)
Well-controlled on metformin.  Continue.  A1c today.

## 2022-03-10 NOTE — Patient Instructions (Signed)
Labs today.  Follow up in 6 months.  Take care  Dr. Jinnifer Montejano  

## 2022-03-10 NOTE — Assessment & Plan Note (Signed)
Continue statin.  Lipid panel today. 

## 2022-03-10 NOTE — Assessment & Plan Note (Signed)
Patient appears at her baseline.  Continue Aricept.

## 2022-03-11 LAB — CBC
Hematocrit: 36.7 % (ref 34.0–46.6)
Hemoglobin: 12 g/dL (ref 11.1–15.9)
MCH: 28.2 pg (ref 26.6–33.0)
MCHC: 32.7 g/dL (ref 31.5–35.7)
MCV: 86 fL (ref 79–97)
Platelets: 262 x10E3/uL (ref 150–450)
RBC: 4.26 x10E6/uL (ref 3.77–5.28)
RDW: 12.9 % (ref 11.7–15.4)
WBC: 5 x10E3/uL (ref 3.4–10.8)

## 2022-03-11 LAB — HEMOGLOBIN A1C
Est. average glucose Bld gHb Est-mCnc: 126 mg/dL
Hgb A1c MFr Bld: 6 % — ABNORMAL HIGH (ref 4.8–5.6)

## 2022-03-11 LAB — CMP14+EGFR
ALT: 11 IU/L (ref 0–32)
AST: 12 IU/L (ref 0–40)
Albumin/Globulin Ratio: 1.6 (ref 1.2–2.2)
Albumin: 4.4 g/dL (ref 3.8–4.8)
Alkaline Phosphatase: 90 IU/L (ref 44–121)
BUN/Creatinine Ratio: 12 (ref 12–28)
BUN: 9 mg/dL (ref 8–27)
Bilirubin Total: 0.3 mg/dL (ref 0.0–1.2)
CO2: 24 mmol/L (ref 20–29)
Calcium: 9.7 mg/dL (ref 8.7–10.3)
Chloride: 98 mmol/L (ref 96–106)
Creatinine, Ser: 0.73 mg/dL (ref 0.57–1.00)
Globulin, Total: 2.7 g/dL (ref 1.5–4.5)
Glucose: 103 mg/dL — ABNORMAL HIGH (ref 70–99)
Potassium: 4.8 mmol/L (ref 3.5–5.2)
Sodium: 134 mmol/L (ref 134–144)
Total Protein: 7.1 g/dL (ref 6.0–8.5)
eGFR: 85 mL/min/1.73 (ref >59)

## 2022-03-11 LAB — LIPID PANEL
Chol/HDL Ratio: 2.6 ratio (ref 0.0–4.4)
Cholesterol, Total: 143 mg/dL (ref 100–199)
HDL: 56 mg/dL (ref >39)
LDL Chol Calc (NIH): 70 mg/dL (ref 0–99)
Triglycerides: 90 mg/dL (ref 0–149)
VLDL Cholesterol Cal: 17 mg/dL (ref 5–40)

## 2022-03-11 LAB — TSH: TSH: 2.7 u[IU]/mL (ref 0.450–4.500)

## 2022-03-11 LAB — VITAMIN D 25 HYDROXY (VIT D DEFICIENCY, FRACTURES): Vit D, 25-Hydroxy: 68.7 ng/mL (ref 30.0–100.0)

## 2022-03-16 DIAGNOSIS — G912 (Idiopathic) normal pressure hydrocephalus: Secondary | ICD-10-CM | POA: Diagnosis not present

## 2022-03-16 DIAGNOSIS — E1140 Type 2 diabetes mellitus with diabetic neuropathy, unspecified: Secondary | ICD-10-CM | POA: Diagnosis not present

## 2022-03-16 DIAGNOSIS — R2681 Unsteadiness on feet: Secondary | ICD-10-CM | POA: Diagnosis not present

## 2022-03-16 DIAGNOSIS — R471 Dysarthria and anarthria: Secondary | ICD-10-CM | POA: Diagnosis not present

## 2022-03-16 DIAGNOSIS — R5381 Other malaise: Secondary | ICD-10-CM | POA: Diagnosis not present

## 2022-03-16 DIAGNOSIS — G309 Alzheimer's disease, unspecified: Secondary | ICD-10-CM | POA: Diagnosis not present

## 2022-03-16 DIAGNOSIS — I10 Essential (primary) hypertension: Secondary | ICD-10-CM | POA: Diagnosis not present

## 2022-03-16 DIAGNOSIS — Z7984 Long term (current) use of oral hypoglycemic drugs: Secondary | ICD-10-CM | POA: Diagnosis not present

## 2022-04-07 ENCOUNTER — Other Ambulatory Visit: Payer: Self-pay

## 2022-04-07 MED ORDER — FESOTERODINE FUMARATE ER 8 MG PO TB24: 8.0000 mg | ORAL_TABLET | Freq: Every day | ORAL | 1 refills | Status: DC

## 2022-04-11 ENCOUNTER — Encounter: Payer: Self-pay | Admitting: Podiatry

## 2022-04-11 ENCOUNTER — Ambulatory Visit (INDEPENDENT_AMBULATORY_CARE_PROVIDER_SITE_OTHER): Payer: Medicare Other | Admitting: Podiatry

## 2022-04-11 ENCOUNTER — Other Ambulatory Visit: Payer: Self-pay | Admitting: Family Medicine

## 2022-04-11 DIAGNOSIS — M79675 Pain in left toe(s): Secondary | ICD-10-CM

## 2022-04-11 DIAGNOSIS — F0280 Dementia in other diseases classified elsewhere without behavioral disturbance: Secondary | ICD-10-CM

## 2022-04-11 DIAGNOSIS — M79674 Pain in right toe(s): Secondary | ICD-10-CM

## 2022-04-11 DIAGNOSIS — G309 Alzheimer's disease, unspecified: Secondary | ICD-10-CM

## 2022-04-11 DIAGNOSIS — B351 Tinea unguium: Secondary | ICD-10-CM | POA: Diagnosis not present

## 2022-04-11 DIAGNOSIS — E1142 Type 2 diabetes mellitus with diabetic polyneuropathy: Secondary | ICD-10-CM | POA: Diagnosis not present

## 2022-04-11 NOTE — Progress Notes (Signed)
This patient presents  to my office for at risk foot care.  This patient requires this care by a professional since this patient will be at risk due to having diabetes.  This patient is unable to cut nails himself since the patient cannot reach his nails.These nails are painful walking and wearing shoes.  She presents to the office in a wheelchair.  This patient presents for at risk foot care today.  General Appearance  Alert, conversant and in no acute stress.  Vascular  Dorsalis pedis and posterior tibial  pulses are  weakly palpable  bilaterally.  Capillary return is within normal limits  bilaterally. Temperature is within normal limits  bilaterally.  Neurologic  Senn-Weinstein monofilament wire test within normal limits  bilaterally. Muscle power within normal limits bilaterally.  Nails Thick disfigured discolored nails with subungual debris  from hallux to fifth toes bilaterally. No evidence of bacterial infection or drainage bilaterally.  Orthopedic  No limitations of motion  feet .  No crepitus or effusions noted.  No bony pathology or digital deformities noted.  Skin  normotropic skin with no porokeratosis noted bilaterally.  No signs of infections or ulcers noted.     Onychomycosis  Pain in right toes  Pain in left toes  Consent was obtained for treatment procedures.   Mechanical debridement of nails 1-5  bilaterally performed with a nail nipper.  Filed with dremel without incident.    Return office visit  3 months                    Told patient to return for periodic foot care and evaluation due to potential at risk complications.   Lorenda Peck DPM

## 2022-04-13 ENCOUNTER — Ambulatory Visit: Payer: Medicare Other | Admitting: Podiatry

## 2022-04-20 ENCOUNTER — Ambulatory Visit: Payer: Medicare Other | Admitting: Podiatry

## 2022-04-25 ENCOUNTER — Other Ambulatory Visit: Payer: Self-pay | Admitting: Family Medicine

## 2022-04-25 ENCOUNTER — Ambulatory Visit: Payer: Medicare Other

## 2022-05-11 ENCOUNTER — Telehealth: Payer: Self-pay

## 2022-05-11 NOTE — Telephone Encounter (Signed)
Med list and diagnosis list faxed to provided number 

## 2022-05-11 NOTE — Telephone Encounter (Signed)
Caller name:[name]   On DPR? :Yes  Call back number:[phone]  Provider they see: Lacinda Axon   Reason for call:[name] from [facility] called [facility] needs diagnoses and med list to be faxed to [phone] for [facility] Dr Lacinda Axon has already filled out forms and sent in and this is what they are still needing

## 2022-05-18 DIAGNOSIS — T148XXA Other injury of unspecified body region, initial encounter: Secondary | ICD-10-CM | POA: Diagnosis not present

## 2022-06-09 ENCOUNTER — Ambulatory Visit (INDEPENDENT_AMBULATORY_CARE_PROVIDER_SITE_OTHER): Payer: Medicare Other

## 2022-06-09 DIAGNOSIS — Z111 Encounter for screening for respiratory tuberculosis: Secondary | ICD-10-CM

## 2022-06-09 DIAGNOSIS — Z23 Encounter for immunization: Secondary | ICD-10-CM | POA: Diagnosis not present

## 2022-06-12 LAB — QUANTIFERON-TB GOLD PLUS
QuantiFERON Mitogen Value: >10 IU/mL
QuantiFERON Nil Value: 0 IU/mL
QuantiFERON TB1 Ag Value: 0 IU/mL
QuantiFERON TB2 Ag Value: 0 IU/mL
QuantiFERON-TB Gold Plus: NEGATIVE

## 2022-06-20 ENCOUNTER — Other Ambulatory Visit: Payer: Self-pay | Admitting: Family Medicine

## 2022-06-21 ENCOUNTER — Other Ambulatory Visit: Payer: Self-pay | Admitting: Family Medicine

## 2022-07-04 ENCOUNTER — Other Ambulatory Visit: Payer: Self-pay | Admitting: Family Medicine

## 2022-07-04 DIAGNOSIS — E1142 Type 2 diabetes mellitus with diabetic polyneuropathy: Secondary | ICD-10-CM

## 2022-07-12 ENCOUNTER — Ambulatory Visit: Payer: Medicare Other | Admitting: Podiatry

## 2022-08-31 ENCOUNTER — Ambulatory Visit (INDEPENDENT_AMBULATORY_CARE_PROVIDER_SITE_OTHER): Payer: 59 | Admitting: Podiatry

## 2022-08-31 ENCOUNTER — Encounter: Payer: Self-pay | Admitting: Podiatry

## 2022-08-31 DIAGNOSIS — E1142 Type 2 diabetes mellitus with diabetic polyneuropathy: Secondary | ICD-10-CM | POA: Diagnosis not present

## 2022-08-31 DIAGNOSIS — G309 Alzheimer's disease, unspecified: Secondary | ICD-10-CM

## 2022-08-31 DIAGNOSIS — M79674 Pain in right toe(s): Secondary | ICD-10-CM | POA: Diagnosis not present

## 2022-08-31 DIAGNOSIS — B351 Tinea unguium: Secondary | ICD-10-CM | POA: Diagnosis not present

## 2022-08-31 DIAGNOSIS — M79675 Pain in left toe(s): Secondary | ICD-10-CM

## 2022-08-31 DIAGNOSIS — F0280 Dementia in other diseases classified elsewhere without behavioral disturbance: Secondary | ICD-10-CM

## 2022-08-31 NOTE — Progress Notes (Signed)
This patient presents  to my office for at risk foot care.  This patient requires this care by a professional since this patient will be at risk due to having diabetes.  This patient is unable to cut nails himself since the patient cannot reach his nails.These nails are painful walking and wearing shoes.  She presents to the office with a caregiver This patient presents for at risk foot care today.  General Appearance  Alert, conversant and in no acute stress.  Vascular  Dorsalis pedis and posterior tibial  pulses are  weakly palpable  bilaterally.  Capillary return is within normal limits  bilaterally. Temperature is within normal limits  bilaterally.  Neurologic  Senn-Weinstein monofilament wire test within normal limits  bilaterally. Muscle power within normal limits bilaterally.  Nails Thick disfigured discolored nails with subungual debris  from hallux to fifth toes bilaterally. No evidence of bacterial infection or drainage bilaterally.  Orthopedic  No limitations of motion  feet .  No crepitus or effusions noted.  No bony pathology or digital deformities noted.  Skin  normotropic skin with no porokeratosis noted bilaterally.  No signs of infections or ulcers noted.     Onychomycosis  Pain in right toes  Pain in left toes  Consent was obtained for treatment procedures.   Mechanical debridement of nails 1-5  bilaterally performed with a nail nipper.  Filed with dremel without incident.    Return office visit  3 months                    Told patient to return for periodic foot care and evaluation due to potential at risk complications.   Gardiner Barefoot DPM

## 2022-09-09 ENCOUNTER — Ambulatory Visit: Payer: Medicare Other | Admitting: Family Medicine

## 2022-10-31 ENCOUNTER — Other Ambulatory Visit: Payer: Self-pay | Admitting: Family Medicine

## 2022-10-31 DIAGNOSIS — E1142 Type 2 diabetes mellitus with diabetic polyneuropathy: Secondary | ICD-10-CM

## 2022-12-15 ENCOUNTER — Other Ambulatory Visit: Payer: Self-pay | Admitting: Family Medicine

## 2022-12-15 DIAGNOSIS — E1142 Type 2 diabetes mellitus with diabetic polyneuropathy: Secondary | ICD-10-CM

## 2023-01-19 ENCOUNTER — Other Ambulatory Visit: Payer: Self-pay

## 2023-01-19 ENCOUNTER — Emergency Department (HOSPITAL_COMMUNITY)
Admission: EM | Admit: 2023-01-19 | Discharge: 2023-01-19 | Disposition: A | Discharge status: Home / Self Care | Payer: 59 | Attending: Emergency Medicine | Admitting: Emergency Medicine

## 2023-01-19 ENCOUNTER — Encounter (HOSPITAL_COMMUNITY): Payer: Self-pay

## 2023-01-19 DIAGNOSIS — E119 Type 2 diabetes mellitus without complications: Secondary | ICD-10-CM | POA: Insufficient documentation

## 2023-01-19 DIAGNOSIS — Z79899 Other long term (current) drug therapy: Secondary | ICD-10-CM | POA: Insufficient documentation

## 2023-01-19 DIAGNOSIS — Z7984 Long term (current) use of oral hypoglycemic drugs: Secondary | ICD-10-CM | POA: Diagnosis not present

## 2023-01-19 DIAGNOSIS — G309 Alzheimer's disease, unspecified: Secondary | ICD-10-CM | POA: Insufficient documentation

## 2023-01-19 DIAGNOSIS — F0280 Dementia in other diseases classified elsewhere without behavioral disturbance: Secondary | ICD-10-CM | POA: Insufficient documentation

## 2023-01-19 DIAGNOSIS — S8002XA Contusion of left knee, initial encounter: Secondary | ICD-10-CM | POA: Diagnosis not present

## 2023-01-19 DIAGNOSIS — W19XXXA Unspecified fall, initial encounter: Secondary | ICD-10-CM | POA: Diagnosis not present

## 2023-01-19 DIAGNOSIS — S8992XA Unspecified injury of left lower leg, initial encounter: Secondary | ICD-10-CM | POA: Diagnosis present

## 2023-01-19 DIAGNOSIS — R6889 Other general symptoms and signs: Secondary | ICD-10-CM | POA: Diagnosis not present

## 2023-01-19 DIAGNOSIS — I10 Essential (primary) hypertension: Secondary | ICD-10-CM | POA: Insufficient documentation

## 2023-01-19 DIAGNOSIS — Z743 Need for continuous supervision: Secondary | ICD-10-CM | POA: Diagnosis not present

## 2023-01-19 DIAGNOSIS — Z041 Encounter for examination and observation following transport accident: Secondary | ICD-10-CM | POA: Diagnosis not present

## 2023-01-19 NOTE — Discharge Instructions (Signed)
Tylenol if needed for pain, follow-up with her primary care provider for recheck if needed.

## 2023-01-19 NOTE — ED Triage Notes (Signed)
Pt brought in by [facility] from [facility]. Pt fell did not hit head, did not black out, and states no pain. Administrator states she needed to come be checked out. Pt dementia at baseline.

## 2023-01-19 NOTE — ED Provider Notes (Signed)
[place] EMERGENCY DEPARTMENT AT [facility] Provider Note   CSN: [id number] Arrival date & time: 01/19/23  1326     History  Chief Complaint  Patient presents with   Leah Payne is a 77 y.o. female.   Fall       Leah Payne is a 77 y.o. female with past medical history of dementia, seizure disorder, hypertension, type 2 diabetes, Alzheimer's, and WPW who presents to the Emergency Department via EMS for evaluation after a fall.  Per report received from nursing staff, patient was sent to the ER for evaluation after an unwitnessed fall at the care home.  He was reported to me that patient fell did not hit her head, did not lose consciousness and patient denies any pain.  Per the care homes protocol, patient was sent to the emergency department for evaluation.  Multiple attempts were made to contact the care home and patient's spouse, all unsuccessful    Home Medications Prior to Admission medications   Medication Sig Start Date End Date Taking? Authorizing Provider  Accu-Chek FastClix Lancets MISC Use as instructed to check blood sugar once daily 09/06/19   Eustaquio Boyden, MD  Alcohol Swabs (B-D SINGLE USE SWABS REGULAR) PADS Use as instructed to check blood sugar once daily 09/06/19   Eustaquio Boyden, MD  atorvastatin (LIPITOR) 40 MG tablet TAKE (1) TABLET BY MOUTH EVERY EVENING. 06/21/22   Babs Sciara, MD  Blood Glucose Monitoring Suppl (ACCU-CHEK GUIDE) w/Device KIT 1 each by Does not apply route as directed. Use as instructed to check blood sugar once daily 09/06/19   Eustaquio Boyden, MD  Cholecalciferol (VITAMIN D) 50 MCG (2000 UT) CAPS Take 1 capsule (2,000 Units total) by mouth daily. 02/02/19   Eustaquio Boyden, MD  donepezil (ARICEPT) 10 MG tablet TAKE (1) TABLET BY MOUTH EVERY MORNING. 06/21/22   Babs Sciara, MD  glucose blood (ACCU-CHEK GUIDE) test strip USE TO CHECK BLOOD SUGAR ONCE DAILY. 12/15/22   Tommie Sams, DO   levETIRAcetam (KEPPRA) 500 MG tablet TAKE (1) TABLET BY MOUTH TWICE DAILY. 06/21/22   Babs Sciara, MD  levothyroxine (SYNTHROID) 25 MCG tablet TAKE (1) TABLET BY MOUTH ONCE DAILY BEFORE BREAKFAST. 06/21/22   Babs Sciara, MD  metFORMIN (GLUCOPHAGE) 500 MG tablet TAKE (1) TABLET BY MOUTH EVERY MORNING. 06/21/22   Babs Sciara, MD  [name] 50 MG TB24 tablet TAKE (1) TABLET BY MOUTH AT BEDTIME. 06/21/22   Luking, Jonna Coup, MD  propranolol (INDERAL) 10 MG tablet TAKE (1) TABLET BY MOUTH TWICE DAILY. 06/21/22   Babs Sciara, MD  sertraline (ZOLOFT) 100 MG tablet TAKE (1) TABLET BY MOUTH EVERY MORNING. 06/21/22   Luking, Jonna Coup, MD  [name] 8 MG TB24 tablet TAKE (1) TABLET BY MOUTH ONCE DAILY. 06/21/22   Babs Sciara, MD      Allergies    Patient has no known allergies.    Review of Systems   Review of Systems  Unable to perform ROS: Dementia    Physical Exam Updated Vital Signs BP (!) 174/84 (BP Location: Right Arm)   Pulse 72   Temp 99 F (37.2 C) (Oral)   Resp 16   Ht 5' (1.524 m)   Wt 52.6 kg   SpO2 97%   BMI 22.65 kg/m  Physical Exam Vitals and nursing note reviewed.  Constitutional:      General: She is not in acute distress.  Appearance: Normal appearance. She is not toxic-appearing.  HENT:     Head: Atraumatic.  Eyes:     Extraocular Movements: Extraocular movements intact.     Conjunctiva/sclera: Conjunctivae normal.     Pupils: Pupils are equal, round, and reactive to light.  Cardiovascular:     Rate and Rhythm: Normal rate and regular rhythm.     Pulses: Normal pulses.  Pulmonary:     Effort: Pulmonary effort is normal.  Chest:     Chest wall: No tenderness.  Abdominal:     Palpations: Abdomen is soft.     Tenderness: There is no abdominal tenderness.  Musculoskeletal:        General: No tenderness, deformity or signs of injury. Normal range of motion.     Cervical back: Normal range of motion. No tenderness.     Comments: Patient moves  all extremities without difficulty or complaint of pain.  There is a small bruise to the lateral aspect of the left knee.  No bony deformities or edema.  Patient has full range of motion of the knee without difficulty.  Skin:    General: Skin is warm.     Capillary Refill: Capillary refill takes less than 2 seconds.     Findings: No erythema or rash.  Neurological:     General: No focal deficit present.     Mental Status: She is alert.     Sensory: No sensory deficit.     Motor: No weakness.     ED Results / Procedures / Treatments   Labs (all labs ordered are listed, but only abnormal results are displayed) Labs Reviewed - No data to display  EKG None  Radiology No results found.  Procedures Procedures    Medications Ordered in ED Medications - No data to display  ED Course/ Medical Decision Making/ A&P                             Medical Decision Making Patient sent here from local care home for evaluation after a fall.  Fall was unwitnessed but reports that was given to me by nursing staff states patient did not have head injury or loss of consciousness.  Patient denies any pain and moves all extremities without difficulty.  States she was sent here as care homes protocol was to have ER evaluation after a fall.  Patient denies any pain, requesting to return home.  I have made multiple attempts to contact patient's emergency contact who is her spouse, but no one answers.  I also contacted the care home multiple times unsuccessfully.  Amount and/or Complexity of Data Reviewed Discussion of management or test interpretation with external provider(s): Patient has been evaluated, no clinical findings suggestive of emergent process or acute injury.  I feel that patient is appropriate for return back to facility           Final Clinical Impression(s) / ED Diagnoses Final diagnoses:  Fall, initial encounter    Rx / DC Orders ED Discharge Orders     None          Pauline Aus, PA-C 01/20/23 1439    Vanetta Mulders, MD 01/21/23 1407

## 2023-01-24 ENCOUNTER — Other Ambulatory Visit: Payer: Self-pay

## 2023-01-24 ENCOUNTER — Emergency Department (HOSPITAL_COMMUNITY)
Admission: EM | Admit: 2023-01-24 | Discharge: 2023-01-24 | Disposition: A | Discharge status: Home / Self Care | Payer: 59 | Attending: Emergency Medicine | Admitting: Emergency Medicine

## 2023-01-24 ENCOUNTER — Emergency Department (HOSPITAL_COMMUNITY): Payer: 59

## 2023-01-24 ENCOUNTER — Encounter (HOSPITAL_COMMUNITY): Payer: Self-pay | Admitting: Emergency Medicine

## 2023-01-24 DIAGNOSIS — Z1152 Encounter for screening for COVID-19: Secondary | ICD-10-CM | POA: Insufficient documentation

## 2023-01-24 DIAGNOSIS — R059 Cough, unspecified: Secondary | ICD-10-CM | POA: Insufficient documentation

## 2023-01-24 DIAGNOSIS — R509 Fever, unspecified: Secondary | ICD-10-CM | POA: Diagnosis present

## 2023-01-24 DIAGNOSIS — Z7984 Long term (current) use of oral hypoglycemic drugs: Secondary | ICD-10-CM | POA: Insufficient documentation

## 2023-01-24 DIAGNOSIS — R531 Weakness: Secondary | ICD-10-CM | POA: Insufficient documentation

## 2023-01-24 DIAGNOSIS — F0390 Unspecified dementia without behavioral disturbance: Secondary | ICD-10-CM | POA: Insufficient documentation

## 2023-01-24 DIAGNOSIS — Z743 Need for continuous supervision: Secondary | ICD-10-CM | POA: Diagnosis not present

## 2023-01-24 DIAGNOSIS — N3000 Acute cystitis without hematuria: Secondary | ICD-10-CM | POA: Diagnosis not present

## 2023-01-24 DIAGNOSIS — R6889 Other general symptoms and signs: Secondary | ICD-10-CM | POA: Diagnosis not present

## 2023-01-24 DIAGNOSIS — Z79899 Other long term (current) drug therapy: Secondary | ICD-10-CM | POA: Insufficient documentation

## 2023-01-24 DIAGNOSIS — R053 Chronic cough: Secondary | ICD-10-CM | POA: Diagnosis not present

## 2023-01-24 LAB — URINALYSIS, ROUTINE W REFLEX MICROSCOPIC
Bilirubin Urine: NEGATIVE
Glucose, UA: NEGATIVE mg/dL
Hgb urine dipstick: NEGATIVE
Ketones, ur: NEGATIVE mg/dL
Nitrite: NEGATIVE
Protein, ur: NEGATIVE mg/dL
Specific Gravity, Urine: 1.015 (ref 1.005–1.030)
pH: 5 (ref 5.0–8.0)

## 2023-01-24 LAB — CBC
HCT: 37.4 % (ref 36.0–46.0)
Hemoglobin: 12.2 g/dL (ref 12.0–15.0)
MCH: 29 pg (ref 26.0–34.0)
MCHC: 32.6 g/dL (ref 30.0–36.0)
MCV: 89 fL (ref 80.0–100.0)
Platelets: 277 10*3/uL (ref 150–400)
RBC: 4.2 MIL/uL (ref 3.87–5.11)
RDW: 12.9 % (ref 11.5–15.5)
WBC: 8.1 10*3/uL (ref 4.0–10.5)
nRBC: 0 % (ref 0.0–0.2)

## 2023-01-24 LAB — RESP PANEL BY RT-PCR (RSV, FLU A&B, COVID)  RVPGX2
Influenza A by PCR: NEGATIVE
Influenza B by PCR: NEGATIVE
Resp Syncytial Virus by PCR: NEGATIVE
SARS Coronavirus 2 by RT PCR: NEGATIVE

## 2023-01-24 LAB — BASIC METABOLIC PANEL
Anion gap: 9 (ref 5–15)
BUN: 11 mg/dL (ref 8–23)
CO2: 29 mmol/L (ref 22–32)
Calcium: 9.1 mg/dL (ref 8.9–10.3)
Chloride: 97 mmol/L — ABNORMAL LOW (ref 98–111)
Creatinine, Ser: 0.52 mg/dL (ref 0.44–1.00)
GFR, Estimated: >60 mL/min (ref >60)
Glucose, Bld: 128 mg/dL — ABNORMAL HIGH (ref 70–99)
Potassium: 3.1 mmol/L — ABNORMAL LOW (ref 3.5–5.1)
Sodium: 135 mmol/L (ref 135–145)

## 2023-01-24 LAB — TROPONIN I (HIGH SENSITIVITY): Troponin I (High Sensitivity): 5 ng/L (ref <18)

## 2023-01-24 MED ORDER — CEPHALEXIN 500 MG PO CAPS: 500.0000 mg | ORAL_CAPSULE | Freq: Two times a day (BID) | ORAL | 0 refills | Status: DC

## 2023-01-24 MED ORDER — SODIUM CHLORIDE 0.9 % IV SOLN
1.0000 g | Freq: Once | INTRAVENOUS | Status: AC
  Administered 2023-01-24: 1 g via INTRAVENOUS
  Filled 2023-01-24: qty 10

## 2023-01-24 NOTE — ED Provider Notes (Signed)
EMERGENCY DEPARTMENT AT [facility] Provider Note   CSN: [id number] Arrival date & time: 01/24/23  1048     History  Chief Complaint  Patient presents with   Weakness    Leah Payne is a 77 y.o. female.  77 year old female with a history of Alzheimer's dementia, hydrocephalus, seizures, and WPW who presents to the emergency department with generalized weakness.  Patient states that since last night she has been having some generalized weakness.  Denies any chest pain, shortness of breath, cough, nausea, vomiting, diarrhea, or urinary frequency/urgency. Per [facility]. Has been weak had a fever of 101.18F has had a cough as well. This morning when she woke up had some additional eye drainage. Has been feeling weak as well.        Home Medications Prior to Admission medications   Medication Sig Start Date End Date Taking? Authorizing Provider  cephALEXin (KEFLEX) 500 MG capsule Take 1 capsule (500 mg total) by mouth 2 (two) times daily for 7 days. 01/24/23 01/31/23 Yes Rondel Baton, MD  Accu-Chek FastClix Lancets MISC Use as instructed to check blood sugar once daily 09/06/19   Eustaquio Boyden, MD  Alcohol Swabs (B-D SINGLE USE SWABS REGULAR) PADS Use as instructed to check blood sugar once daily 09/06/19   Eustaquio Boyden, MD  atorvastatin (LIPITOR) 40 MG tablet TAKE (1) TABLET BY MOUTH EVERY EVENING. 06/21/22   Babs Sciara, MD  Blood Glucose Monitoring Suppl (ACCU-CHEK GUIDE) w/Device KIT 1 each by Does not apply route as directed. Use as instructed to check blood sugar once daily 09/06/19   Eustaquio Boyden, MD  Cholecalciferol (VITAMIN D) 50 MCG (2000 UT) CAPS Take 1 capsule (2,000 Units total) by mouth daily. 02/02/19   Eustaquio Boyden, MD  donepezil (ARICEPT) 10 MG tablet TAKE (1) TABLET BY MOUTH EVERY MORNING. 06/21/22   Babs Sciara, MD  glucose blood (ACCU-CHEK GUIDE) test strip USE TO CHECK BLOOD SUGAR ONCE DAILY.  12/15/22   Tommie Sams, DO  levETIRAcetam (KEPPRA) 500 MG tablet TAKE (1) TABLET BY MOUTH TWICE DAILY. 06/21/22   Babs Sciara, MD  levothyroxine (SYNTHROID) 25 MCG tablet TAKE (1) TABLET BY MOUTH ONCE DAILY BEFORE BREAKFAST. 06/21/22   Babs Sciara, MD  metFORMIN (GLUCOPHAGE) 500 MG tablet TAKE (1) TABLET BY MOUTH EVERY MORNING. 06/21/22   Babs Sciara, MD  [name] 50 MG TB24 tablet TAKE (1) TABLET BY MOUTH AT BEDTIME. 06/21/22   Luking, Jonna Coup, MD  propranolol (INDERAL) 10 MG tablet TAKE (1) TABLET BY MOUTH TWICE DAILY. 06/21/22   Babs Sciara, MD  sertraline (ZOLOFT) 100 MG tablet TAKE (1) TABLET BY MOUTH EVERY MORNING. 06/21/22   Luking, Jonna Coup, MD  [name] 8 MG TB24 tablet TAKE (1) TABLET BY MOUTH ONCE DAILY. 06/21/22   Babs Sciara, MD      Allergies    Patient has no known allergies.    Review of Systems   Review of Systems  Physical Exam Updated Vital Signs BP (!) 161/93 (BP Location: Right Arm)   Pulse 69   Temp 97.8 F (36.6 C) (Oral)   Resp 20   Ht 5\' 1"  (1.549 m)   Wt 57.6 kg   SpO2 97%   BMI 24.00 kg/m  Physical Exam Vitals and nursing note reviewed.  Constitutional:      General: She is not in acute distress.    Appearance: She is well-developed.  HENT:  Head: Normocephalic and atraumatic.     Right Ear: External ear normal.     Left Ear: External ear normal.     Nose: Nose normal.  Eyes:     Extraocular Movements: Extraocular movements intact.     Conjunctiva/sclera: Conjunctivae normal.     Pupils: Pupils are equal, round, and reactive to light.  Cardiovascular:     Rate and Rhythm: Normal rate and regular rhythm.     Heart sounds: No murmur heard. Pulmonary:     Effort: Pulmonary effort is normal. No respiratory distress.     Breath sounds: Normal breath sounds.  Abdominal:     General: Abdomen is flat.     Palpations: Abdomen is soft.  Musculoskeletal:     Cervical back: Normal range of motion and neck supple.     Right  lower leg: No edema.     Left lower leg: No edema.  Skin:    General: Skin is warm and dry.  Neurological:     Mental Status: She is alert and oriented to person, place, and time. Mental status is at baseline.  Psychiatric:        Mood and Affect: Mood normal.     ED Results / Procedures / Treatments   Labs (all labs ordered are listed, but only abnormal results are displayed) Labs Reviewed  BASIC METABOLIC PANEL - Abnormal; Notable for the following components:      Result Value   Potassium 3.1 (*)    Chloride 97 (*)    Glucose, Bld 128 (*)    All other components within normal limits  URINALYSIS, ROUTINE W REFLEX MICROSCOPIC - Abnormal; Notable for the following components:   APPearance HAZY (*)    Leukocytes,Ua MODERATE (*)    Bacteria, UA MANY (*)    All other components within normal limits  RESP PANEL BY RT-PCR (RSV, FLU A&B, COVID)  RVPGX2  CBC  TROPONIN I (HIGH SENSITIVITY)    EKG EKG Interpretation Date/Time:  Tuesday January 24 2023 11:19:51 EDT Ventricular Rate:  70 PR Interval:  160 QRS Duration:  89 QT Interval:  402 QTC Calculation: 434 R Axis:   34  Text Interpretation: Sinus rhythm Ventricular premature complex Probable LVH with secondary repol abnrm Abnormal inferior Q waves Confirmed by Vonita Moss [phone]) on 01/24/2023 3:11:54 PM  Radiology DG Chest Port 1 View  Result Date: 01/24/2023 CLINICAL DATA:  Chronic cough. EXAM: PORTABLE CHEST 1 VIEW COMPARISON:  11/15/2020 FINDINGS: Lung apices obscured by the patient's face. The lungs are clear without focal pneumonia, edema, pneumothorax or pleural effusion. Cardiopericardial silhouette is at upper limits of normal for size. No acute bony abnormality. Telemetry leads overlie the chest. IMPRESSION: No active disease. Electronically Signed   By: Kennith Center M.D.   On: 01/24/2023 12:23    Procedures Procedures    Medications Ordered in ED Medications  cefTRIAXone (ROCEPHIN) 1 g in sodium chloride 0.9  % 100 mL IVPB (has no administration in time range)    ED Course/ Medical Decision Making/ A&P Clinical Course as of 01/24/23 1709  Tue Jan 24, 2023  1622 Signed out to Dr Jearld Fenton.  [RP]    Clinical Course User Index [RP] Rondel Baton, MD                             Medical Decision Making Amount and/or Complexity of Data Reviewed Labs: ordered. Radiology: ordered.   [name]  is a 77 y.o. female with comorbidities that complicate the patient evaluation including dementia, hydrocephalus, seizures, and WPW who presents to the emergency department with generalized weakness.   Initial Ddx:  Pneumonia, URI, UTI, MI  MDM/Course:  Send the patient may potentially have a upper respiratory tract infection or pneumonia given her cough and fever.  With her generalized weakness did obtain EKG and troponin that did not show any acute abnormalities.  COVID, flu, and urinalysis were pending on the time of signout to the oncoming doctor.  Upon re-evaluation patient remained stable.  Feel that she likely can be discharged back to her facility unless any concerning findings on her labs or changes to her clinical status.  This patient presents to the ED for concern of complaints listed in HPI, this involves an extensive number of treatment options, and is a complaint that carries with it a high risk of complications and morbidity. Disposition including potential need for admission considered.   Dispo: Pending remainder of workup  Additional history obtained from Nursing Home/Care Facility Records reviewed Outpatient Clinic Notes I independently reviewed the following imaging with scope of interpretation limited to determining acute life threatening conditions related to emergency care: Chest x-ray and agree with the radiologist interpretation with the following exceptions: none I personally reviewed and interpreted cardiac monitoring: normal sinus rhythm  I personally reviewed and  interpreted the pt's EKG: see above for interpretation  Social Determinants of health:  Elderly        Final Clinical Impression(s) / ED Diagnoses Final diagnoses:  Acute cystitis without hematuria  Generalized weakness    Rx / DC Orders ED Discharge Orders          Ordered    cephALEXin (KEFLEX) 500 MG capsule  2 times daily        01/24/23 1709              Rondel Baton, MD 01/24/23 1709

## 2023-01-24 NOTE — ED Notes (Signed)
Pt brief noted to be soiled. Cleaned and dress with clean brief. Pt continues to be in hallway stretcher waiting for EMS transport back to facility.

## 2023-01-24 NOTE — ED Provider Notes (Signed)
6:27 PM Assumed care of patient from off-going team. For more details, please see note from same day.  In brief, this is a 77 y.o. female w/ alzheimer's, presents from facility with fever/cough. CXR without pneumonia. No resp distress, likely dispo is DC. Afebrile and HDS here. Facility stated 101F.   Plan/Dispo at time of sign-out & ED Course since sign-out: [ ]  covid/flu, UA  BP (!) 146/89 (BP Location: Right Arm)   Pulse 73   Temp 98.2 F (36.8 C) (Oral)   Resp 19   Ht 5\' 1"  (1.549 m)   Wt 57.6 kg   SpO2 93%   BMI 24.00 kg/m    ED Course:   Clinical Course as of 01/24/23 1827  Tue Jan 24, 2023  1622 Signed out to Dr Jearld Fenton.  [RP]  1610 UA showed UTI. Given ceftriaxone here IV and keflex for DC. Covid/flu/RSV neg. Will be DC'd in stable condition w/ o/p PCP f/u.  [HN]    Clinical Course User Index [HN] Loetta Rough, MD [RP] Rondel Baton, MD    Dispo: DC ------------------------------- Vivi Barrack, MD Emergency Medicine  This note was created using dictation software, which may contain spelling or grammatical errors.   Loetta Rough, MD 01/24/23 [phone]

## 2023-01-24 NOTE — Discharge Instructions (Addendum)
You were seen for your urinary tract infection in the emergency department.   At home, please take the antibiotics we have prescribed you.   Follow-up with your primary doctor in 2-3 days regarding your visit.   Return immediately to the emergency department if you experience any of the following: high fevers, severe flank pain, or any other concerning symptoms.    Thank you for visiting our Emergency Department. It was a pleasure taking care of you today.

## 2023-01-24 NOTE — ED Notes (Signed)
[facility] called to inform RN that they are on way to pick up pt.

## 2023-01-24 NOTE — ED Triage Notes (Signed)
Pt BIB [facility] with reports of weakness and cough. Pt denies pain, fevers, and vomiting.

## 2023-01-25 ENCOUNTER — Emergency Department (HOSPITAL_COMMUNITY)
Admission: EM | Admit: 2023-01-25 | Discharge: 2023-01-26 | Disposition: A | Discharge status: Home / Self Care | Payer: 59 | Attending: Emergency Medicine | Admitting: Emergency Medicine

## 2023-01-25 ENCOUNTER — Other Ambulatory Visit: Payer: Self-pay

## 2023-01-25 ENCOUNTER — Encounter (HOSPITAL_COMMUNITY): Payer: Self-pay

## 2023-01-25 DIAGNOSIS — Z7984 Long term (current) use of oral hypoglycemic drugs: Secondary | ICD-10-CM | POA: Diagnosis not present

## 2023-01-25 DIAGNOSIS — N3000 Acute cystitis without hematuria: Secondary | ICD-10-CM | POA: Insufficient documentation

## 2023-01-25 DIAGNOSIS — R5381 Other malaise: Secondary | ICD-10-CM | POA: Diagnosis not present

## 2023-01-25 DIAGNOSIS — E119 Type 2 diabetes mellitus without complications: Secondary | ICD-10-CM | POA: Insufficient documentation

## 2023-01-25 DIAGNOSIS — R404 Transient alteration of awareness: Secondary | ICD-10-CM | POA: Diagnosis not present

## 2023-01-25 DIAGNOSIS — F0390 Unspecified dementia without behavioral disturbance: Secondary | ICD-10-CM | POA: Insufficient documentation

## 2023-01-25 DIAGNOSIS — Z743 Need for continuous supervision: Secondary | ICD-10-CM | POA: Diagnosis not present

## 2023-01-25 DIAGNOSIS — N390 Urinary tract infection, site not specified: Secondary | ICD-10-CM | POA: Diagnosis present

## 2023-01-25 DIAGNOSIS — R410 Disorientation, unspecified: Secondary | ICD-10-CM | POA: Diagnosis not present

## 2023-01-25 MED ORDER — CEPHALEXIN 500 MG PO CAPS
500.0000 mg | ORAL_CAPSULE | Freq: Once | ORAL | Status: DC
  Filled 2023-01-25: qty 1

## 2023-01-25 MED ORDER — CEFTRIAXONE SODIUM 1 G IJ SOLR
1.0000 g | Freq: Once | INTRAMUSCULAR | Status: AC
  Administered 2023-01-25: 1 g via INTRAMUSCULAR
  Filled 2023-01-25: qty 10

## 2023-01-25 MED ORDER — CEPHALEXIN 500 MG PO CAPS: 500.0000 mg | ORAL_CAPSULE | Freq: Four times a day (QID) | ORAL | 0 refills | Status: DC

## 2023-01-25 NOTE — Discharge Instructions (Signed)
Take the antibiotic Keflex as directed.  Have your urine rechecked in a week.  Return for any new or worse symptoms.

## 2023-01-25 NOTE — ED Provider Notes (Signed)
[place] EMERGENCY DEPARTMENT AT [facility] Provider Note   CSN: [id number] Arrival date & time: 01/25/23  1947     History  Chief Complaint  Patient presents with   Urinary Tract Infection    Leah Payne is a 77 y.o. female.  Patient was seen here yesterday.  Diagnosed with urinary tract infection.  Urine culture not sent.  She was given a dose of Rocephin here and then prescription for Keflex.  But went to the wrong pharmacy so they sent her back tonight.  We have the correct pharmacy now.  Patient's vital signs Temp 98.4 blood pressure 142/53 oxygen sats 97 heart rate 78.  Patient's past medical history significant hyperlipidemia diabetes       Home Medications Prior to Admission medications   Medication Sig Start Date End Date Taking? Authorizing Provider  cephALEXin (KEFLEX) 500 MG capsule Take 1 capsule (500 mg total) by mouth 4 (four) times daily. 01/25/23  Yes Vanetta Mulders, MD  Accu-Chek FastClix Lancets MISC Use as instructed to check blood sugar once daily 09/06/19   Eustaquio Boyden, MD  Alcohol Swabs (B-D SINGLE USE SWABS REGULAR) PADS Use as instructed to check blood sugar once daily 09/06/19   Eustaquio Boyden, MD  atorvastatin (LIPITOR) 40 MG tablet TAKE (1) TABLET BY MOUTH EVERY EVENING. 06/21/22   Babs Sciara, MD  Blood Glucose Monitoring Suppl (ACCU-CHEK GUIDE) w/Device KIT 1 each by Does not apply route as directed. Use as instructed to check blood sugar once daily 09/06/19   Eustaquio Boyden, MD  Cholecalciferol (VITAMIN D) 50 MCG (2000 UT) CAPS Take 1 capsule (2,000 Units total) by mouth daily. 02/02/19   Eustaquio Boyden, MD  donepezil (ARICEPT) 10 MG tablet TAKE (1) TABLET BY MOUTH EVERY MORNING. 06/21/22   Babs Sciara, MD  glucose blood (ACCU-CHEK GUIDE) test strip USE TO CHECK BLOOD SUGAR ONCE DAILY. 12/15/22   Tommie Sams, DO  levETIRAcetam (KEPPRA) 500 MG tablet TAKE (1) TABLET BY MOUTH TWICE DAILY. 06/21/22   Babs Sciara,  MD  levothyroxine (SYNTHROID) 25 MCG tablet TAKE (1) TABLET BY MOUTH ONCE DAILY BEFORE BREAKFAST. 06/21/22   Babs Sciara, MD  metFORMIN (GLUCOPHAGE) 500 MG tablet TAKE (1) TABLET BY MOUTH EVERY MORNING. 06/21/22   Babs Sciara, MD  [name] 50 MG TB24 tablet TAKE (1) TABLET BY MOUTH AT BEDTIME. 06/21/22   Luking, Jonna Coup, MD  propranolol (INDERAL) 10 MG tablet TAKE (1) TABLET BY MOUTH TWICE DAILY. 06/21/22   Babs Sciara, MD  sertraline (ZOLOFT) 100 MG tablet TAKE (1) TABLET BY MOUTH EVERY MORNING. 06/21/22   Luking, Jonna Coup, MD  [name] 8 MG TB24 tablet TAKE (1) TABLET BY MOUTH ONCE DAILY. 06/21/22   Babs Sciara, MD      Allergies    Patient has no known allergies.    Review of Systems   Review of Systems  Unable to perform ROS: Dementia    Physical Exam Updated Vital Signs BP (!) 142/53 (BP Location: Left Arm)   Pulse 78   Temp 98.4 F (36.9 C)   Resp 18   Ht 1.549 m (5\' 1" )   Wt 57.6 kg   SpO2 97%   BMI 23.99 kg/m  Physical Exam Vitals and nursing note reviewed.  Constitutional:      General: She is not in acute distress.    Appearance: Normal appearance. She is well-developed.  HENT:     Head: Normocephalic and atraumatic.  Eyes:  Conjunctiva/sclera: Conjunctivae normal.  Cardiovascular:     Rate and Rhythm: Normal rate and regular rhythm.     Heart sounds: No murmur heard. Pulmonary:     Effort: Pulmonary effort is normal. No respiratory distress.     Breath sounds: Normal breath sounds.  Abdominal:     Palpations: Abdomen is soft.     Tenderness: There is no abdominal tenderness.  Musculoskeletal:        General: No swelling.     Cervical back: Normal range of motion and neck supple.  Skin:    General: Skin is warm and dry.     Capillary Refill: Capillary refill takes less than 2 seconds.  Neurological:     Mental Status: She is alert. Mental status is at baseline.  Psychiatric:        Mood and Affect: Mood normal.     ED Results /  Procedures / Treatments   Labs (all labs ordered are listed, but only abnormal results are displayed) Labs Reviewed - No data to display  EKG None  Radiology DG Chest [facility] 1 View  Result Date: 01/24/2023 CLINICAL DATA:  Chronic cough. EXAM: PORTABLE CHEST 1 VIEW COMPARISON:  11/15/2020 FINDINGS: Lung apices obscured by the patient's face. The lungs are clear without focal pneumonia, edema, pneumothorax or pleural effusion. Cardiopericardial silhouette is at upper limits of normal for size. No acute bony abnormality. Telemetry leads overlie the chest. IMPRESSION: No active disease. Electronically Signed   By: Kennith Center M.D.   On: 01/24/2023 12:23    Procedures Procedures    Medications Ordered in ED Medications  cephALEXin (KEFLEX) capsule 500 mg (has no administration in time range)    ED Course/ Medical Decision Making/ A&P                             Medical Decision Making Risk Prescription drug management.   Patient's vital signs here are very reassuring.  Patient given a dose of Keflex here she had Rocephin IV last evening.  I have switched her Keflex prescription to the pharmacy that they wanted for the urinary tract infection.   Final Clinical Impression(s) / ED Diagnoses Final diagnoses:  Acute cystitis without hematuria    Rx / DC Orders ED Discharge Orders          Ordered    cephALEXin (KEFLEX) 500 MG capsule  4 times daily        01/25/23 2234              Vanetta Mulders, MD 01/25/23 2247

## 2023-01-25 NOTE — ED Triage Notes (Signed)
Pt BIB [facility] from [facility] in [place]- Pt seen here yesterday for increased confusion with hx of alzheimer's -pt was evaluated and dx with UTI - anbx sent to last pharmacy on pt chart which was the wrong one, pt pharmacy has been updated to [facility] in [place]- verified that this was the pharmacy facility currently uses for it's residents. This nurse spoke with Roddie Mc at facility who says pt spouse (who resides at facility also) demanded she come back to hospital. Pt has not received any anbx other than dose she was given here yesterday. Dr Deretha Emory made aware of situation.

## 2023-01-26 DIAGNOSIS — R1111 Vomiting without nausea: Secondary | ICD-10-CM | POA: Diagnosis not present

## 2023-01-26 DIAGNOSIS — Z743 Need for continuous supervision: Secondary | ICD-10-CM | POA: Diagnosis not present

## 2023-01-26 NOTE — ED Notes (Signed)
Called facility to give report and inquire about transportation. Unable to talk to anyone. Pt is on list for convo transport

## 2023-01-26 NOTE — ED Notes (Signed)
Pt resting comfortably, VSS, awaiting transport. No concerns voiced.

## 2023-01-26 NOTE — ED Notes (Signed)
Notified [facility] of patient needing transportation back to [facility].

## 2023-02-02 ENCOUNTER — Telehealth: Payer: Self-pay | Admitting: *Deleted

## 2023-02-02 NOTE — Telephone Encounter (Signed)
Transition Care Management Unsuccessful Follow-up Telephone Call  Date of discharge and from where:  [facility]  Attempts:  1st Attempt  Reason for unsuccessful TCM follow-up call: Declined in ASL

## 2023-02-14 ENCOUNTER — Inpatient Hospital Stay (HOSPITAL_COMMUNITY)
Admission: EM | Admit: 2023-02-14 | Discharge: 2023-02-20 | DRG: 064 | Disposition: A | Discharge status: Skilled Nursing Facility | Payer: 59 | Source: Skilled Nursing Facility | Attending: Internal Medicine | Admitting: Internal Medicine

## 2023-02-14 ENCOUNTER — Emergency Department (HOSPITAL_COMMUNITY): Payer: 59

## 2023-02-14 ENCOUNTER — Other Ambulatory Visit: Payer: Self-pay

## 2023-02-14 DIAGNOSIS — I639 Cerebral infarction, unspecified: Secondary | ICD-10-CM | POA: Diagnosis not present

## 2023-02-14 DIAGNOSIS — R404 Transient alteration of awareness: Secondary | ICD-10-CM | POA: Diagnosis not present

## 2023-02-14 DIAGNOSIS — I10 Essential (primary) hypertension: Secondary | ICD-10-CM | POA: Diagnosis present

## 2023-02-14 DIAGNOSIS — E782 Mixed hyperlipidemia: Secondary | ICD-10-CM | POA: Diagnosis not present

## 2023-02-14 DIAGNOSIS — N390 Urinary tract infection, site not specified: Secondary | ICD-10-CM | POA: Diagnosis present

## 2023-02-14 DIAGNOSIS — R6520 Severe sepsis without septic shock: Secondary | ICD-10-CM | POA: Diagnosis present

## 2023-02-14 DIAGNOSIS — E1169 Type 2 diabetes mellitus with other specified complication: Secondary | ICD-10-CM | POA: Diagnosis not present

## 2023-02-14 DIAGNOSIS — G919 Hydrocephalus, unspecified: Secondary | ICD-10-CM | POA: Diagnosis not present

## 2023-02-14 DIAGNOSIS — E872 Acidosis, unspecified: Secondary | ICD-10-CM | POA: Diagnosis present

## 2023-02-14 DIAGNOSIS — R0609 Other forms of dyspnea: Secondary | ICD-10-CM | POA: Diagnosis not present

## 2023-02-14 DIAGNOSIS — R2681 Unsteadiness on feet: Secondary | ICD-10-CM | POA: Insufficient documentation

## 2023-02-14 DIAGNOSIS — E538 Deficiency of other specified B group vitamins: Secondary | ICD-10-CM | POA: Diagnosis not present

## 2023-02-14 DIAGNOSIS — R42 Dizziness and giddiness: Secondary | ICD-10-CM | POA: Diagnosis present

## 2023-02-14 DIAGNOSIS — Z79899 Other long term (current) drug therapy: Secondary | ICD-10-CM

## 2023-02-14 DIAGNOSIS — I6523 Occlusion and stenosis of bilateral carotid arteries: Secondary | ICD-10-CM | POA: Diagnosis not present

## 2023-02-14 DIAGNOSIS — E039 Hypothyroidism, unspecified: Secondary | ICD-10-CM | POA: Diagnosis not present

## 2023-02-14 DIAGNOSIS — Q039 Congenital hydrocephalus, unspecified: Secondary | ICD-10-CM

## 2023-02-14 DIAGNOSIS — E876 Hypokalemia: Secondary | ICD-10-CM | POA: Diagnosis not present

## 2023-02-14 DIAGNOSIS — Z801 Family history of malignant neoplasm of trachea, bronchus and lung: Secondary | ICD-10-CM

## 2023-02-14 DIAGNOSIS — R531 Weakness: Secondary | ICD-10-CM | POA: Diagnosis not present

## 2023-02-14 DIAGNOSIS — Z8249 Family history of ischemic heart disease and other diseases of the circulatory system: Secondary | ICD-10-CM

## 2023-02-14 DIAGNOSIS — I6389 Other cerebral infarction: Secondary | ICD-10-CM | POA: Diagnosis not present

## 2023-02-14 DIAGNOSIS — E1142 Type 2 diabetes mellitus with diabetic polyneuropathy: Secondary | ICD-10-CM | POA: Diagnosis not present

## 2023-02-14 DIAGNOSIS — G40919 Epilepsy, unspecified, intractable, without status epilepticus: Secondary | ICD-10-CM | POA: Diagnosis not present

## 2023-02-14 DIAGNOSIS — N3000 Acute cystitis without hematuria: Secondary | ICD-10-CM

## 2023-02-14 DIAGNOSIS — Z743 Need for continuous supervision: Secondary | ICD-10-CM | POA: Diagnosis not present

## 2023-02-14 DIAGNOSIS — G40909 Epilepsy, unspecified, not intractable, without status epilepticus: Secondary | ICD-10-CM | POA: Diagnosis present

## 2023-02-14 DIAGNOSIS — F05 Delirium due to known physiological condition: Secondary | ICD-10-CM | POA: Diagnosis present

## 2023-02-14 DIAGNOSIS — E11649 Type 2 diabetes mellitus with hypoglycemia without coma: Secondary | ICD-10-CM | POA: Diagnosis present

## 2023-02-14 DIAGNOSIS — Z79890 Hormone replacement therapy (postmenopausal): Secondary | ICD-10-CM

## 2023-02-14 DIAGNOSIS — Z66 Do not resuscitate: Secondary | ICD-10-CM | POA: Diagnosis present

## 2023-02-14 DIAGNOSIS — Z820 Family history of epilepsy and other diseases of the nervous system: Secondary | ICD-10-CM

## 2023-02-14 DIAGNOSIS — Z7982 Long term (current) use of aspirin: Secondary | ICD-10-CM

## 2023-02-14 DIAGNOSIS — E785 Hyperlipidemia, unspecified: Secondary | ICD-10-CM | POA: Diagnosis not present

## 2023-02-14 DIAGNOSIS — G9389 Other specified disorders of brain: Secondary | ICD-10-CM | POA: Diagnosis not present

## 2023-02-14 DIAGNOSIS — A4151 Sepsis due to Escherichia coli [E. coli]: Secondary | ICD-10-CM | POA: Diagnosis present

## 2023-02-14 DIAGNOSIS — Z823 Family history of stroke: Secondary | ICD-10-CM

## 2023-02-14 DIAGNOSIS — R262 Difficulty in walking, not elsewhere classified: Secondary | ICD-10-CM | POA: Diagnosis present

## 2023-02-14 DIAGNOSIS — Z7984 Long term (current) use of oral hypoglycemic drugs: Secondary | ICD-10-CM

## 2023-02-14 DIAGNOSIS — F0390 Unspecified dementia without behavioral disturbance: Secondary | ICD-10-CM | POA: Diagnosis present

## 2023-02-14 DIAGNOSIS — Q030 Malformations of aqueduct of Sylvius: Secondary | ICD-10-CM | POA: Diagnosis not present

## 2023-02-14 DIAGNOSIS — R569 Unspecified convulsions: Secondary | ICD-10-CM | POA: Diagnosis not present

## 2023-02-14 DIAGNOSIS — I6621 Occlusion and stenosis of right posterior cerebral artery: Secondary | ICD-10-CM | POA: Diagnosis not present

## 2023-02-14 DIAGNOSIS — H9190 Unspecified hearing loss, unspecified ear: Secondary | ICD-10-CM | POA: Diagnosis present

## 2023-02-14 DIAGNOSIS — I771 Stricture of artery: Secondary | ICD-10-CM | POA: Diagnosis not present

## 2023-02-14 DIAGNOSIS — B9620 Unspecified Escherichia coli [E. coli] as the cause of diseases classified elsewhere: Secondary | ICD-10-CM | POA: Diagnosis present

## 2023-02-14 LAB — CBC
HCT: 38 % (ref 36.0–46.0)
Hemoglobin: 12.6 g/dL (ref 12.0–15.0)
MCH: 29.2 pg (ref 26.0–34.0)
MCHC: 33.2 g/dL (ref 30.0–36.0)
MCV: 88 fL (ref 80.0–100.0)
Platelets: 263 10*3/uL (ref 150–400)
RBC: 4.32 MIL/uL (ref 3.87–5.11)
RDW: 13.2 % (ref 11.5–15.5)
WBC: 8.2 10*3/uL (ref 4.0–10.5)
nRBC: 0 % (ref 0.0–0.2)

## 2023-02-14 LAB — URINALYSIS, W/ REFLEX TO CULTURE (INFECTION SUSPECTED)
Bilirubin Urine: NEGATIVE
Glucose, UA: NEGATIVE mg/dL
Ketones, ur: 5 mg/dL — AB
Nitrite: POSITIVE — AB
Protein, ur: 30 mg/dL — AB
Specific Gravity, Urine: 1.01 (ref 1.005–1.030)
WBC, UA: >50 WBC/hpf (ref 0–5)
pH: 6 (ref 5.0–8.0)

## 2023-02-14 LAB — COMPREHENSIVE METABOLIC PANEL
ALT: 16 U/L (ref 0–44)
AST: 18 U/L (ref 15–41)
Albumin: 3.3 g/dL — ABNORMAL LOW (ref 3.5–5.0)
Alkaline Phosphatase: 77 U/L (ref 38–126)
Anion gap: 11 (ref 5–15)
BUN: 10 mg/dL (ref 8–23)
CO2: 26 mmol/L (ref 22–32)
Calcium: 8.7 mg/dL — ABNORMAL LOW (ref 8.9–10.3)
Chloride: 96 mmol/L — ABNORMAL LOW (ref 98–111)
Creatinine, Ser: 0.59 mg/dL (ref 0.44–1.00)
GFR, Estimated: >60 mL/min (ref >60)
Glucose, Bld: 117 mg/dL — ABNORMAL HIGH (ref 70–99)
Potassium: 3.1 mmol/L — ABNORMAL LOW (ref 3.5–5.1)
Sodium: 133 mmol/L — ABNORMAL LOW (ref 135–145)
Total Bilirubin: 0.9 mg/dL (ref 0.3–1.2)
Total Protein: 7.2 g/dL (ref 6.5–8.1)

## 2023-02-14 MED ORDER — SODIUM CHLORIDE 0.9 % IV SOLN
1.0000 g | Freq: Once | INTRAVENOUS | Status: AC
  Administered 2023-02-14: 1 g via INTRAVENOUS
  Filled 2023-02-14: qty 10

## 2023-02-14 MED ORDER — POTASSIUM CHLORIDE IN NACL 40-0.9 MEQ/L-% IV SOLN
INTRAVENOUS | Status: AC
  Filled 2023-02-14: qty 1000

## 2023-02-14 MED ORDER — SODIUM CHLORIDE 0.9 % IV BOLUS
500.0000 mL | Freq: Once | INTRAVENOUS | Status: AC
  Administered 2023-02-14: 500 mL via INTRAVENOUS

## 2023-02-14 NOTE — ED Triage Notes (Signed)
Pt complains of dizziness since yesterday and today upon waking. Denies any vomiting, diarrhea.

## 2023-02-14 NOTE — ED Notes (Signed)
Called all pt contacts with no answer. Will attempt again

## 2023-02-14 NOTE — ED Provider Notes (Addendum)
[facility] AT [facility] Provider Note   CSN: [id number] Arrival date & time: 02/14/23  1430     History  Chief Complaint  Patient presents with   Dizziness    Leah Payne is a 77 y.o. female.   Dizziness Patient presents with dizziness.  Somewhat difficult to get history.  This appears to be her baseline.  Answers yes to most questions.  Reportedly just feels a little off.  No nausea or diarrhea.  History of chronic hydrocephalus.    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 Authorizing Provider  Accu-Chek FastClix Lancets MISC Use as instructed to check blood sugar once daily 09/06/19   Eustaquio Boyden, MD  Alcohol Swabs (B-D SINGLE USE SWABS REGULAR) PADS Use as instructed to check blood sugar once daily 09/06/19   Eustaquio Boyden, MD  atorvastatin (LIPITOR) 40 MG tablet TAKE (1) TABLET BY MOUTH EVERY EVENING. 06/21/22   Babs Sciara, MD  Blood Glucose Monitoring Suppl (ACCU-CHEK GUIDE) w/Device KIT 1 each by Does not apply route as directed. Use as instructed to check blood sugar once daily 09/06/19   Eustaquio Boyden, MD  cephALEXin (KEFLEX) 500 MG capsule Take 1 capsule (500 mg total) by mouth 4 (four) times daily. 01/25/23   Vanetta Mulders, MD  Cholecalciferol (VITAMIN D) 50  MCG (2000 UT) CAPS Take 1 capsule (2,000 Units total) by mouth daily. 02/02/19   Eustaquio Boyden, MD  donepezil (ARICEPT) 10 MG tablet TAKE (1) TABLET BY MOUTH EVERY MORNING. 06/21/22   Babs Sciara, MD  glucose blood (ACCU-CHEK GUIDE) test strip USE TO CHECK BLOOD SUGAR ONCE DAILY. 12/15/22   Tommie Sams, DO  levETIRAcetam (KEPPRA) 500 MG tablet TAKE (1) TABLET BY MOUTH TWICE DAILY. 06/21/22   Babs Sciara, MD  levothyroxine (SYNTHROID) 25 MCG tablet TAKE (1) TABLET BY MOUTH ONCE DAILY BEFORE BREAKFAST. 06/21/22   Babs Sciara, MD  metFORMIN (GLUCOPHAGE) 500 MG tablet TAKE (1) TABLET BY MOUTH EVERY MORNING. 06/21/22   Babs Sciara, MD  [name] 50 MG TB24 tablet TAKE (1) TABLET BY MOUTH AT BEDTIME. 06/21/22   Luking, Jonna Coup, MD  propranolol (INDERAL) 10 MG tablet TAKE (1) TABLET BY MOUTH TWICE DAILY. 06/21/22   Babs Sciara, MD  sertraline (ZOLOFT) 100 MG tablet TAKE (1) TABLET BY MOUTH EVERY MORNING. 06/21/22   Luking, Jonna Coup, MD  [name] 8 MG TB24 tablet TAKE (1) TABLET BY MOUTH ONCE DAILY. 06/21/22   Babs Sciara, MD      Allergies    Patient has no known allergies.    Review of Systems   Review of Systems  Neurological:  Positive for dizziness.    Physical Exam Updated Vital Signs BP 132/88   Pulse 83   Temp 97.8 F (36.6 C) (Oral)  Resp (!) 23   Ht 5\' 2"  (1.575 m)   Wt 58.5 kg   SpO2 98%   BMI 23.59 kg/m  Physical Exam Vitals reviewed.  HENT:     Head:     Comments: Hirsute and has chronic hydrocephalus Cardiovascular:     Rate and Rhythm: Normal rate and regular rhythm.  Chest:     Chest wall: No tenderness.  Abdominal:     Tenderness: There is no abdominal tenderness.  Musculoskeletal:     Comments: Has extra thumb on right hand  Neurological:     Mental Status: She is alert.     Comments: Awake and answers questions.  Follows commands.  Appears to move bilateral upper extremities equally.     ED Results / Procedures / Treatments    Labs (all labs ordered are listed, but only abnormal results are displayed) Labs Reviewed  COMPREHENSIVE METABOLIC PANEL - Abnormal; Notable for the following components:      Result Value   Sodium 133 (*)    Potassium 3.1 (*)    Chloride 96 (*)    Glucose, Bld 117 (*)    Calcium 8.7 (*)    Albumin 3.3 (*)    All other components within normal limits  URINALYSIS, W/ REFLEX TO CULTURE (INFECTION SUSPECTED) - Abnormal; Notable for the following components:   APPearance CLOUDY (*)    Hgb urine dipstick SMALL (*)    Ketones, ur 5 (*)    Protein, ur 30 (*)    Nitrite POSITIVE (*)    Leukocytes,Ua LARGE (*)    Bacteria, UA MANY (*)    All other components within normal limits  URINE CULTURE  CBC    EKG EKG Interpretation Date/Time:  Tuesday February 14 2023 15:57:19 EDT Ventricular Rate:  92 PR Interval:  158 QRS Duration:  85 QT Interval:  376 QTC Calculation: 466 R Axis:   51  Text Interpretation: Sinus rhythm Multiple ventricular premature complexes Minimal ST depression, anterolateral leads pvcs more frequent Confirmed by Benjiman Core [phone]) on 02/14/2023 5:40:11 PM  Radiology CT Head Wo Contrast  Result Date: 02/14/2023 CLINICAL DATA:  Vertigo, central. EXAM: CT HEAD WITHOUT CONTRAST TECHNIQUE: Contiguous axial images were obtained from the base of the skull through the vertex without intravenous contrast. RADIATION DOSE REDUCTION: This exam was performed according to the departmental dose-optimization program which includes automated exposure control, adjustment of the mA and/or kV according to patient size and/or use of iterative reconstruction technique. COMPARISON:  Head CT 02/22/2018. FINDINGS: Brain: No acute hemorrhage. 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 No extra-axial collection. Vascular: No hyperdense vessel or unexpected calcification. Skull: No calvarial fracture or suspicious bone lesion. Skull  base is unremarkable. Sinuses/Orbits: No acute finding. Other: None. IMPRESSION: 1. No acute intracranial abnormality. 2. 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 Electronically Signed   By: Orvan Falconer M.D.   On: 02/14/2023 16:11    Procedures Procedures    Medications Ordered in ED Medications  cefTRIAXone (ROCEPHIN) 1 g in sodium chloride 0.9 % 100 mL IVPB (has no administration in time range)  sodium chloride 0.9 % bolus 500 mL (0 mLs Intravenous Stopped 02/14/23 2030)    ED Course/ Medical Decision Making/ A&P                             Medical Decision Making Amount and/or  Complexity of Data Reviewed Labs: ordered. Radiology: ordered.   Patient with dizziness.  History of chronic hydrocephalus.  Differential diagnosis longer includes dehydration arrhythmia intracranial pathology.  Stroke felt less likely.  Will get head CT however to help evaluate for intracranial problems.  Basic blood work and EKG.  Blood work and EKG reassuring.  Head CT abnormal but appears stable.  Feels better after fluids.  Eager to go home.  Unable to get urine from her.  Unable to tolerate cath.  Not willing to stay.  Will discharge.  Patient had attempted discharge however she was unable to walk.  Reportedly too dizzy to get up.  Cannot go back to where she was at because she was at assisted living.  We have been able to get urine now and will reevaluate.  Apparently did have some erythema in the area of the cath.  Urinalysis does show likely infection.  Has been on antibiotics but no cultures been done.  Cultures been sent now.  However patient has been unable to ambulate due to generalized weakness.  I think with this would benefit from admission to the hospital.  IV antibiotics.  Also potential more workup for the dizziness due to chronic intracranial issues.  Will discuss with hospitalist.        Final Clinical Impression(s) / ED  Diagnoses Final diagnoses:  Dizziness  Acute cystitis without hematuria    Rx / DC Orders ED Discharge Orders     None         Benjiman Core, MD 02/14/23 2032    Benjiman Core, MD 02/14/23 [phone]

## 2023-02-14 NOTE — ED Notes (Signed)
Pt was unable to follow commands. Pt kept asking for husband and can't keep her eyes open. Pt was unable to sit up and stand.   Orthostatic vs can not be completed at this time

## 2023-02-14 NOTE — ED Notes (Signed)
Patient transported to CT 

## 2023-02-14 NOTE — ED Notes (Signed)
Attempted to get pt to use bedside commode, but unable to convince her to stand with 3 of [place] assisting her. We changed her and cleaned her requiring 3 of [place] as she gets very stiff and difficult to maneuver and puts her hands in the mess. She has extreme excoriation in her sacral area.

## 2023-02-14 NOTE — ED Notes (Signed)
Attempted to straight cath pt but she was fighting and would not cooperate. Also refused to use bedpan and did not want to use purewick. MD aware. Pt states she just wants to go home and go to sleep. A&OX4. MD aware and will DC

## 2023-02-15 ENCOUNTER — Observation Stay (HOSPITAL_COMMUNITY): Payer: 59

## 2023-02-15 DIAGNOSIS — E1169 Type 2 diabetes mellitus with other specified complication: Secondary | ICD-10-CM

## 2023-02-15 DIAGNOSIS — R531 Weakness: Secondary | ICD-10-CM | POA: Diagnosis not present

## 2023-02-15 DIAGNOSIS — F0390 Unspecified dementia without behavioral disturbance: Secondary | ICD-10-CM

## 2023-02-15 DIAGNOSIS — R0609 Other forms of dyspnea: Secondary | ICD-10-CM | POA: Diagnosis not present

## 2023-02-15 DIAGNOSIS — E785 Hyperlipidemia, unspecified: Secondary | ICD-10-CM | POA: Diagnosis not present

## 2023-02-15 DIAGNOSIS — E876 Hypokalemia: Secondary | ICD-10-CM | POA: Diagnosis not present

## 2023-02-15 DIAGNOSIS — E1142 Type 2 diabetes mellitus with diabetic polyneuropathy: Secondary | ICD-10-CM | POA: Diagnosis not present

## 2023-02-15 DIAGNOSIS — E039 Hypothyroidism, unspecified: Secondary | ICD-10-CM | POA: Diagnosis not present

## 2023-02-15 DIAGNOSIS — G40909 Epilepsy, unspecified, not intractable, without status epilepticus: Secondary | ICD-10-CM

## 2023-02-15 DIAGNOSIS — I6621 Occlusion and stenosis of right posterior cerebral artery: Secondary | ICD-10-CM | POA: Diagnosis not present

## 2023-02-15 DIAGNOSIS — N3000 Acute cystitis without hematuria: Secondary | ICD-10-CM

## 2023-02-15 DIAGNOSIS — I6523 Occlusion and stenosis of bilateral carotid arteries: Secondary | ICD-10-CM | POA: Diagnosis not present

## 2023-02-15 DIAGNOSIS — R2681 Unsteadiness on feet: Secondary | ICD-10-CM

## 2023-02-15 DIAGNOSIS — R42 Dizziness and giddiness: Secondary | ICD-10-CM | POA: Diagnosis not present

## 2023-02-15 DIAGNOSIS — I639 Cerebral infarction, unspecified: Secondary | ICD-10-CM | POA: Diagnosis not present

## 2023-02-15 DIAGNOSIS — N3001 Acute cystitis with hematuria: Secondary | ICD-10-CM

## 2023-02-15 DIAGNOSIS — G9389 Other specified disorders of brain: Secondary | ICD-10-CM | POA: Diagnosis not present

## 2023-02-15 DIAGNOSIS — I771 Stricture of artery: Secondary | ICD-10-CM | POA: Diagnosis not present

## 2023-02-15 DIAGNOSIS — G919 Hydrocephalus, unspecified: Secondary | ICD-10-CM | POA: Diagnosis not present

## 2023-02-15 LAB — COMPREHENSIVE METABOLIC PANEL
ALT: 14 U/L (ref 0–44)
ALT: 14 U/L (ref 0–44)
AST: 16 U/L (ref 15–41)
AST: 24 U/L (ref 15–41)
Albumin: 3 g/dL — ABNORMAL LOW (ref 3.5–5.0)
Albumin: 3.3 g/dL — ABNORMAL LOW (ref 3.5–5.0)
Alkaline Phosphatase: 65 U/L (ref 38–126)
Alkaline Phosphatase: 72 U/L (ref 38–126)
Anion gap: 8 (ref 5–15)
Anion gap: 13 (ref 5–15)
BUN: 7 mg/dL — ABNORMAL LOW (ref 8–23)
BUN: 7 mg/dL — ABNORMAL LOW (ref 8–23)
CO2: 26 mmol/L (ref 22–32)
CO2: 24 mmol/L (ref 22–32)
Calcium: 8.3 mg/dL — ABNORMAL LOW (ref 8.9–10.3)
Calcium: 8 mg/dL — ABNORMAL LOW (ref 8.9–10.3)
Chloride: 104 mmol/L (ref 98–111)
Chloride: 98 mmol/L (ref 98–111)
Creatinine, Ser: 0.45 mg/dL (ref 0.44–1.00)
Creatinine, Ser: 0.58 mg/dL (ref 0.44–1.00)
GFR, Estimated: >60 mL/min (ref >60)
GFR, Estimated: >60 mL/min (ref >60)
Glucose, Bld: 85 mg/dL (ref 70–99)
Glucose, Bld: 98 mg/dL (ref 70–99)
Potassium: 3.2 mmol/L — ABNORMAL LOW (ref 3.5–5.1)
Potassium: 2.7 mmol/L — CL (ref 3.5–5.1)
Sodium: 138 mmol/L (ref 135–145)
Sodium: 135 mmol/L (ref 135–145)
Total Bilirubin: 0.9 mg/dL (ref 0.3–1.2)
Total Bilirubin: 1 mg/dL (ref 0.3–1.2)
Total Protein: 6.7 g/dL (ref 6.5–8.1)
Total Protein: 7.4 g/dL (ref 6.5–8.1)

## 2023-02-15 LAB — CBC
HCT: 39.3 % (ref 36.0–46.0)
Hemoglobin: 12.8 g/dL (ref 12.0–15.0)
MCH: 28.9 pg (ref 26.0–34.0)
MCHC: 32.6 g/dL (ref 30.0–36.0)
MCV: 88.7 fL (ref 80.0–100.0)
Platelets: 288 10*3/uL (ref 150–400)
RBC: 4.43 MIL/uL (ref 3.87–5.11)
RDW: 13.2 % (ref 11.5–15.5)
WBC: 5.5 10*3/uL (ref 4.0–10.5)
nRBC: 0 % (ref 0.0–0.2)

## 2023-02-15 LAB — GLUCOSE, CAPILLARY
Glucose-Capillary: 80 mg/dL (ref 70–99)
Glucose-Capillary: 89 mg/dL (ref 70–99)
Glucose-Capillary: 162 mg/dL — ABNORMAL HIGH (ref 70–99)
Glucose-Capillary: 138 mg/dL — ABNORMAL HIGH (ref 70–99)

## 2023-02-15 LAB — MAGNESIUM
Magnesium: 1.5 mg/dL — ABNORMAL LOW (ref 1.7–2.4)
Magnesium: 1.9 mg/dL (ref 1.7–2.4)

## 2023-02-15 LAB — URINE CULTURE

## 2023-02-15 LAB — CBC WITH DIFFERENTIAL/PLATELET
Abs Immature Granulocytes: 0.01 10*3/uL (ref 0.00–0.07)
Basophils Absolute: 0 10*3/uL (ref 0.0–0.1)
Basophils Relative: 1 %
Eosinophils Absolute: 0.1 10*3/uL (ref 0.0–0.5)
Eosinophils Relative: 2 %
HCT: 34.2 % — ABNORMAL LOW (ref 36.0–46.0)
Hemoglobin: 11.4 g/dL — ABNORMAL LOW (ref 12.0–15.0)
Immature Granulocytes: 0 %
Lymphocytes Relative: 29 %
Lymphs Abs: 1.7 10*3/uL (ref 0.7–4.0)
MCH: 29.5 pg (ref 26.0–34.0)
MCHC: 33.3 g/dL (ref 30.0–36.0)
MCV: 88.4 fL (ref 80.0–100.0)
Monocytes Absolute: 0.6 10*3/uL (ref 0.1–1.0)
Monocytes Relative: 10 %
Neutro Abs: 3.5 10*3/uL (ref 1.7–7.7)
Neutrophils Relative %: 58 %
Platelets: 251 10*3/uL (ref 150–400)
RBC: 3.87 MIL/uL (ref 3.87–5.11)
RDW: 13.2 % (ref 11.5–15.5)
WBC: 5.9 10*3/uL (ref 4.0–10.5)
nRBC: 0 % (ref 0.0–0.2)

## 2023-02-15 LAB — ECHOCARDIOGRAM COMPLETE
AR max vel: 2.06 cm2
AV Area VTI: 2.05 cm2
AV Area mean vel: 2.08 cm2
AV Mean grad: 4 mmHg
AV Peak grad: 8.3 mmHg
Ao pk vel: 1.44 m/s
Area-P 1/2: 3.81 cm2
Height: 62 in
S' Lateral: 2.6 cm
Weight: 2064 oz

## 2023-02-15 LAB — VITAMIN B12: Vitamin B-12: 167 pg/mL — ABNORMAL LOW (ref 180–914)

## 2023-02-15 LAB — HEMOGLOBIN A1C
Hgb A1c MFr Bld: 5.6 % (ref 4.8–5.6)
Mean Plasma Glucose: 114.02 mg/dL

## 2023-02-15 LAB — TSH: TSH: 1.691 u[IU]/mL (ref 0.350–4.500)

## 2023-02-15 LAB — FOLATE: Folate: 4.3 ng/mL — ABNORMAL LOW (ref >5.9)

## 2023-02-15 LAB — LACTIC ACID, PLASMA: Lactic Acid, Venous: 4.5 mmol/L (ref 0.5–1.9)

## 2023-02-15 MED ORDER — LEVOTHYROXINE SODIUM 25 MCG PO TABS
25.0000 ug | ORAL_TABLET | Freq: Every day | ORAL | Status: DC
  Administered 2023-02-15 – 2023-02-20 (×5): 25 ug via ORAL
  Filled 2023-02-15 (×5): qty 1

## 2023-02-15 MED ORDER — LEVETIRACETAM 500 MG PO TABS
500.0000 mg | ORAL_TABLET | Freq: Two times a day (BID) | ORAL | Status: DC
  Administered 2023-02-15 (×2): 500 mg via ORAL
  Filled 2023-02-15 (×3): qty 1

## 2023-02-15 MED ORDER — ASPIRIN 81 MG PO CHEW
81.0000 mg | CHEWABLE_TABLET | Freq: Every day | ORAL | Status: DC
  Administered 2023-02-15 – 2023-02-20 (×6): 81 mg via ORAL
  Filled 2023-02-15 (×6): qty 1

## 2023-02-15 MED ORDER — MIRABEGRON ER 25 MG PO TB24
50.0000 mg | ORAL_TABLET | Freq: Every day | ORAL | Status: DC
  Administered 2023-02-15 – 2023-02-20 (×6): 50 mg via ORAL
  Filled 2023-02-15 (×6): qty 2

## 2023-02-15 MED ORDER — INSULIN ASPART 100 UNIT/ML IJ SOLN: 0.0000 [IU] | Freq: Three times a day (TID) | INTRAMUSCULAR | Status: DC

## 2023-02-15 MED ORDER — ACETAMINOPHEN 650 MG RE SUPP: 650.0000 mg | Freq: Four times a day (QID) | RECTAL | Status: DC | PRN

## 2023-02-15 MED ORDER — CHLORHEXIDINE GLUCONATE CLOTH 2 % EX PADS: 6.0000 | MEDICATED_PAD | Freq: Every day | CUTANEOUS | Status: DC

## 2023-02-15 MED ORDER — LEVETIRACETAM IN NACL 1000 MG/100ML IV SOLN
INTRAVENOUS | Status: AC
  Filled 2023-02-15: qty 100

## 2023-02-15 MED ORDER — LEVETIRACETAM IN NACL 1000 MG/100ML IV SOLN
1000.0000 mg | INTRAVENOUS | Status: AC
  Administered 2023-02-15: 1000 mg via INTRAVENOUS

## 2023-02-15 MED ORDER — MAGNESIUM SULFATE 2 GM/50ML IV SOLN
2.0000 g | Freq: Once | INTRAVENOUS | Status: AC
  Administered 2023-02-15: 2 g via INTRAVENOUS
  Filled 2023-02-15: qty 50

## 2023-02-15 MED ORDER — LORAZEPAM 2 MG/ML IJ SOLN
INTRAMUSCULAR | Status: AC
  Administered 2023-02-15: 2 mg
  Filled 2023-02-15: qty 1

## 2023-02-15 MED ORDER — ATORVASTATIN CALCIUM 40 MG PO TABS
40.0000 mg | ORAL_TABLET | Freq: Every day | ORAL | Status: DC
  Administered 2023-02-15 – 2023-02-20 (×6): 40 mg via ORAL
  Filled 2023-02-15 (×6): qty 1

## 2023-02-15 MED ORDER — INSULIN ASPART 100 UNIT/ML IJ SOLN
0.0000 [IU] | Freq: Three times a day (TID) | INTRAMUSCULAR | Status: DC
  Administered 2023-02-15: 2 [IU] via SUBCUTANEOUS

## 2023-02-15 MED ORDER — ACETAMINOPHEN 325 MG PO TABS
650.0000 mg | ORAL_TABLET | Freq: Four times a day (QID) | ORAL | Status: DC | PRN
  Filled 2023-02-15: qty 2

## 2023-02-15 MED ORDER — POTASSIUM CHLORIDE 10 MEQ/100ML IV SOLN
10.0000 meq | INTRAVENOUS | Status: AC
  Administered 2023-02-15 – 2023-02-16 (×5): 10 meq via INTRAVENOUS
  Filled 2023-02-15 (×5): qty 100

## 2023-02-15 MED ORDER — CHLORHEXIDINE GLUCONATE CLOTH 2 % EX PADS
6.0000 | MEDICATED_PAD | Freq: Every day | CUTANEOUS | Status: DC
  Administered 2023-02-15 – 2023-02-17 (×3): 6 via TOPICAL

## 2023-02-15 MED ORDER — SODIUM CHLORIDE 0.9 % IV SOLN
1.0000 g | INTRAVENOUS | Status: DC
  Administered 2023-02-15 – 2023-02-17 (×2): 1 g via INTRAVENOUS
  Filled 2023-02-15 (×2): qty 10

## 2023-02-15 MED ORDER — ONDANSETRON HCL 4 MG PO TABS: 4.0000 mg | ORAL_TABLET | Freq: Four times a day (QID) | ORAL | Status: DC | PRN

## 2023-02-15 MED ORDER — OXYCODONE HCL 5 MG PO TABS: 5.0000 mg | ORAL_TABLET | ORAL | Status: DC | PRN

## 2023-02-15 MED ORDER — POTASSIUM CHLORIDE CRYS ER 20 MEQ PO TBCR
20.0000 meq | EXTENDED_RELEASE_TABLET | Freq: Once | ORAL | Status: AC
  Administered 2023-02-15: 20 meq via ORAL
  Filled 2023-02-15: qty 1

## 2023-02-15 MED ORDER — ONDANSETRON HCL 4 MG/2ML IJ SOLN: 4.0000 mg | Freq: Four times a day (QID) | INTRAMUSCULAR | Status: DC | PRN

## 2023-02-15 MED ORDER — FESOTERODINE FUMARATE ER 4 MG PO TB24
8.0000 mg | ORAL_TABLET | Freq: Every day | ORAL | Status: DC
  Administered 2023-02-15 – 2023-02-20 (×6): 8 mg via ORAL
  Filled 2023-02-15 (×5): qty 2
  Filled 2023-02-15: qty 1
  Filled 2023-02-15 (×4): qty 2
  Filled 2023-02-15 (×2): qty 1
  Filled 2023-02-15: qty 2

## 2023-02-15 MED ORDER — HEPARIN SODIUM (PORCINE) 5000 UNIT/ML IJ SOLN
5000.0000 [IU] | Freq: Three times a day (TID) | INTRAMUSCULAR | Status: DC
  Administered 2023-02-15 – 2023-02-20 (×15): 5000 [IU] via SUBCUTANEOUS
  Filled 2023-02-15 (×15): qty 1

## 2023-02-15 MED ORDER — SERTRALINE HCL 100 MG PO TABS
100.0000 mg | ORAL_TABLET | Freq: Every day | ORAL | Status: DC
  Administered 2023-02-16 – 2023-02-19 (×4): 100 mg via ORAL
  Filled 2023-02-15 (×3): qty 1
  Filled 2023-02-15: qty 2

## 2023-02-15 MED ORDER — DONEPEZIL HCL 10 MG PO TABS
10.0000 mg | ORAL_TABLET | Freq: Every day | ORAL | Status: DC
  Administered 2023-02-16 – 2023-02-19 (×4): 10 mg via ORAL
  Filled 2023-02-15 (×3): qty 1
  Filled 2023-02-15: qty 2

## 2023-02-15 NOTE — Assessment & Plan Note (Signed)
-   Hold metformin - Sliding scale coverage - Last hemoglobin A1c controlled

## 2023-02-15 NOTE — Consult Note (Addendum)
I connected with  Leah Payne on 02/15/23 by a video enabled telemedicine application and verified that I am speaking with the correct person using two identifiers.   I discussed the limitations of evaluation and management by telemedicine. The patient expressed understanding and agreed to proceed.  Location of patient: [facility] Location of physician: [facility]  Neurology Consultation Reason for Consult: stroke Referring Physician: Dr Onalee Hua Tat  CC: dizziness  History is obtained from: Patient, chart review  HPI: Leah Payne is a 77 y.o. female with past medical history of hydrocephalus, hyperlipidemia, diabetes, seizures on Keppra, dementia who presented with dizziness.  Also patient has dementia and unable to provide accurate history.  However from chart review, she reported feeling dizzy and trouble ambulating.  She has had previous visits for generalized weakness and has been diagnosed with cystitis.  Currently patient denies any dizziness.  As part of workup for dizziness: Brain was done which showed acute right frontal infarct.  Therefore neurology was consulted.  Last known normal: Unclear Event happened at home No tPA out of window No thrombectomy as no large vessel occlusion mRS 1  ROS: All other systems reviewed and negative except as noted in the HPI.   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 She has never used smokeless tobacco. She reports that she does not drink alcohol and does not use drugs.   Medications Prior to Admission  Medication Sig Dispense Refill Last Dose   atorvastatin (LIPITOR) 40 MG tablet TAKE (1) TABLET BY MOUTH EVERY EVENING. (Patient taking differently: Take 40 mg by mouth daily.) 30 tablet 10 02/14/2023   Cholecalciferol (VITAMIN D) 50 MCG (2000 UT) CAPS Take 1 capsule (2,000 Units total) by mouth daily. 30 capsule  02/14/2023   donepezil (ARICEPT) 10 MG tablet TAKE (1) TABLET BY MOUTH EVERY MORNING. (Patient taking differently: Take 10 mg by mouth at bedtime.) 30 tablet 10 02/14/2023   levETIRAcetam (KEPPRA) 500 MG tablet TAKE (1) TABLET BY MOUTH TWICE DAILY. (Patient taking differently: Take 500 mg by mouth 2 (two) times daily.) 60 tablet 10 02/14/2023   levothyroxine (SYNTHROID) 25 MCG tablet TAKE (1) TABLET BY MOUTH ONCE DAILY BEFORE BREAKFAST. (Patient taking differently: Take 25 mcg by mouth daily before breakfast.) 30 tablet 10 02/14/2023   Melatonin 10 MG TABS Take 5 mg by mouth at bedtime.   02/14/2023   metFORMIN (GLUCOPHAGE) 500 MG tablet TAKE (1) TABLET BY MOUTH EVERY MORNING. (Patient taking differently: Take 500 mg by mouth daily with breakfast.) 30 tablet  10 02/14/2023   [name] 50 MG TB24 tablet TAKE (1) TABLET BY MOUTH AT BEDTIME. (Patient taking differently: Take 50 mg by mouth at bedtime.) 30 tablet 10 02/14/2023   propranolol (INDERAL) 10 MG tablet TAKE (1) TABLET BY MOUTH TWICE DAILY. (Patient taking differently: Take 10 mg by mouth 2 (two) times daily.) 60 tablet 10 02/14/2023   sertraline (ZOLOFT) 100 MG tablet TAKE (1) TABLET BY MOUTH EVERY MORNING. (Patient taking  differently: Take 100 mg by mouth daily.) 30 tablet 10 02/14/2023   [name] 8 MG TB24 tablet TAKE (1) TABLET BY MOUTH ONCE DAILY. (Patient taking differently: Take 8 mg by mouth daily.) 30 tablet 10 02/14/2023   Accu-Chek FastClix Lancets MISC Use as instructed to check blood sugar once daily 100 each 1    Alcohol Swabs (B-D SINGLE USE SWABS REGULAR) PADS Use as instructed to check blood sugar once daily 100 each 1    Blood Glucose Monitoring Suppl (ACCU-CHEK GUIDE) w/Device KIT 1 each by Does not apply route as directed. Use as instructed to check blood sugar once daily 1 kit 0    glucose blood (ACCU-CHEK GUIDE) test strip USE TO CHECK BLOOD SUGAR ONCE DAILY. 50 strip 3       Exam: Current vital signs: BP (!) 171/96   Pulse 78   Temp 98.1 F (36.7 C)   Resp 18   Ht 5\' 2"  (1.575 m)   Wt 58.5 kg   SpO2 100%   BMI 23.59 kg/m  Vital signs in last 24 hours: Temp:  [97.8 F (36.6 C)-98.2 F (36.8 C)] 98.1 F (36.7 C) (07/24 0327) Pulse Rate:  [76-90] 78 (07/24 0327) Resp:  [16-24] 18 (07/24 0327) BP: (132-180)/(73-96) 171/96 (07/24 0327) SpO2:  [97 %-100 %] 100 % (07/24 0327) Weight:  [58.5 kg] 58.5 kg (07/23 1458)   Physical Exam  Constitutional: Appears well-developed and well-nourished.  Psych: Affect appropriate to situation Neuro: Awake, alert, oriented to place and person, time: September, no aphasia, no dysarthria, cranial nerves II 12 appear grossly intact, antigravity without drift in all 4 extremities, FTN intact bilaterally, sensation intact to light touch  NIHSS 0  I have reviewed labs in epic and the results pertinent to this consultation are: CBC:  Recent Labs  Lab 02/14/23 1531 02/15/23 0423  WBC 8.2 5.9  NEUTROABS  --  3.5  HGB 12.6 11.4*  HCT 38.0 34.2*  MCV 88.0 88.4  PLT 263 251    Basic Metabolic Panel:  Lab Results  Component Value Date   NA 138 02/15/2023   K 3.2 (L) 02/15/2023   CO2 26 02/15/2023   GLUCOSE 85 02/15/2023   BUN 7 (L)  02/15/2023   CREATININE 0.45 02/15/2023   CALCIUM 8.3 (L) 02/15/2023   GFRNONAA >60 02/15/2023   GFRAA >60 07/26/2019   Lipid Panel:  Lab Results  Component Value Date   LDLCALC 70 03/10/2022   HgbA1c:  Lab Results  Component Value Date   HGBA1C 6.0 (H) 03/10/2022   Urine Drug Screen: No results found for: "LABOPIA", "COCAINSCRNUR", "LABBENZ", "AMPHETMU", "THCU", "LABBARB"  Alcohol Level No results found for: "ETH"   I have reviewed the images obtained:  CT Head without contrast 02/14/2023:No acute intracranial abnormality. Unchanged severe dilation of the lateral and third ventricles,consistent with aqueductal stenosis.  MRI Brain wo contrast 02/15/2023: Acute infarct in the right middle frontal gyrus. No acute hemorrhage or significant mass effect. Stable severe dilation of the third and lateral ventricles,consistent with aqueductal stenosis.  ASSESSMENT/PLAN: 77 year old female presented with dizziness.  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  If there is intracranial stenosis in right MCA, recommend adding Plavix 75 mg daily for 3 months -PT/OT -Goal blood pressure: Normotension -Stroke education with modification of stroke risk factors -Follow-up with [facility] in 3 months -Discussed plan with Dr. Arbutus Leas via secure chat   Thank you for allowing [place] to participate in the care of this patient. If you have any further questions, please contact  me or neurohospitalist.   Lindie Spruce [facility] neurohospitalist

## 2023-02-15 NOTE — Evaluation (Signed)
Physical Therapy Evaluation Patient Details Name: Leah Payne MRN: [id number] DOB: [birth year] Today's Date: 02/15/2023  History of Present Illness  Leah Payne is a 77 y.o. female with medical history significant of dementia, diabetes mellitus type 2, hydrocephalus, hyperlipidemia, seizure disorder, and more presents ED with a chief complaint of dizziness.  Patient does have a history of dementia, and is unsure why she is here.  When reminded that she had complained of dizziness she says yes that is why she is here.  History was difficult to obtain.  She does not think she had any headache, nausea, vomiting.  She does report having trouble ambulating.  Patient reports that she ambulates independently at baseline.  It seems she has had many complaints of generalized weakness from previous visits.  She is often diagnosed with an infection.  Patient denies any chest pain or palpitations.  She seems to be at her baseline mentation with orientation to person, place and to year and when given time to think about it.  Patient has no other complaints at this time.   Clinical Impression  Patient demonstrates slow labored movement for sitting up at bedside, once seated had difficulty completing sit to stands due to posterior leaning with feet sliding forward requiring repeated at attempts before able to stand with Mod/max assist.  Patient limited to a few shuffling side steps during transfer to chair and tolerated sitting up after therapy - nursing staff aware.  Patient will benefit from continued skilled physical therapy in hospital and recommended venue below to increase strength, balance, endurance for safe ADLs and gait.          Assistance Recommended at Discharge Set up Supervision/Assistance  If plan is discharge home, recommend the following:  Can travel by private vehicle  A lot of help with bathing/dressing/bathroom;A lot of help with walking and/or transfers;Help with stairs or ramp for  entrance;Assistance with cooking/housework   No    Equipment Recommendations None recommended by PT  Recommendations for Other Services       Functional Status Assessment Patient has had a recent decline in their functional status and demonstrates the ability to make significant improvements in function in a reasonable and predictable amount of time.     Precautions / Restrictions Precautions Precautions: Fall Restrictions Weight Bearing Restrictions: No      Mobility  Bed Mobility Overal bed mobility: Needs Assistance Bed Mobility: Supine to Sit     Supine to sit: Mod assist     General bed mobility comments: increased time, labored movement    Transfers Overall transfer level: Needs assistance Equipment used: Rolling walker (2 wheels) Transfers: Sit to/from Stand, Bed to chair/wheelchair/BSC Sit to Stand: Mod assist, Max assist   Step pivot transfers: Mod assist, Max assist       General transfer comment: very unsteady with feet sliding foreward during sit to stands, requiring repeated attempts before able to stand    Ambulation/Gait Ambulation/Gait assistance: Max assist Gait Distance (Feet): 3 Feet Assistive device: Rolling walker (2 wheels) Gait Pattern/deviations: Decreased step length - right, Decreased step length - left, Decreased stride length, Narrow base of support, Leaning posteriorly Gait velocity: slow     General Gait Details: limited to a few side steps at bedside due to leaning backwards with feet sliding forward, poor standing balance  Stairs            Wheelchair Mobility     Tilt Bed    Modified Rankin (Stroke Patients Only)  Balance Overall balance assessment: Needs assistance Sitting-balance support: Feet supported, No upper extremity supported Sitting balance-Leahy Scale: Fair Sitting balance - Comments: seated at EOB   Standing balance support: Reliant on assistive device for balance, During functional  activity, Bilateral upper extremity supported Standing balance-Leahy Scale: Poor Standing balance comment: using RW                             Pertinent Vitals/Pain Pain Assessment Pain Assessment: No/denies pain    Home Living Family/patient expects to be discharged to:: Assisted living                 Home Equipment: Agricultural consultant (2 wheels);Rollator (4 wheels)      Prior Function Prior Level of Function : Needs assist       Physical Assist : Mobility (physical);ADLs (physical) Mobility (physical): Bed mobility;Transfers;Gait;Stairs   Mobility Comments: household ambulation without AD, "per patient" ADLs Comments: assisted by ALF staff     Hand Dominance        Extremity/Trunk Assessment   Upper Extremity Assessment Upper Extremity Assessment: Generalized weakness    Lower Extremity Assessment Lower Extremity Assessment: Generalized weakness    Cervical / Trunk Assessment Cervical / Trunk Assessment: Kyphotic  Communication   Communication: No difficulties  Cognition Arousal/Alertness: Awake/alert Behavior During Therapy: WFL for tasks assessed/performed Overall Cognitive Status: Within Functional Limits for tasks assessed                                          General Comments      Exercises     Assessment/Plan    PT Assessment Patient needs continued PT services  PT Problem List Decreased strength;Decreased activity tolerance;Decreased balance;Decreased mobility       PT Treatment Interventions DME instruction;Gait training;Functional mobility training;Therapeutic activities;Therapeutic exercise;Balance training;Patient/family education    PT Goals (Current goals can be found in the Care Plan section)  Acute Rehab PT Goals Patient Stated Goal: return home with ALF staff to assist PT Goal Formulation: With patient Time For Goal Achievement: 03/01/23 Potential to Achieve Goals: Good    Frequency Min  3X/week     Co-evaluation               AM-PAC PT "6 Clicks" Mobility  Outcome Measure Help needed turning from your back to your side while in a flat bed without using bedrails?: A Lot Help needed moving from lying on your back to sitting on the side of a flat bed without using bedrails?: A Lot Help needed moving to and from a bed to a chair (including a wheelchair)?: A Lot Help needed standing up from a chair using your arms (e.g., wheelchair or bedside chair)?: A Lot Help needed to walk in hospital room?: A Lot Help needed climbing 3-5 steps with a railing? : Total 6 Click Score: 11    End of Session   Activity Tolerance: Patient tolerated treatment well;Patient limited by fatigue Patient left: in chair;with call bell/phone within reach Nurse Communication: Mobility status PT Visit Diagnosis: Unsteadiness on feet (R26.81);Other abnormalities of gait and mobility (R26.89);Muscle weakness (generalized) (M62.81)    Time: 1610-9604 PT Time Calculation (min) (ACUTE ONLY): 15 min   Charges:   PT Evaluation $PT Eval Low Complexity: 1 Low PT Treatments $Therapeutic Activity: 8-22 mins PT General Charges $$ ACUTE PT VISIT: 1  Visit         12:06 PM, 02/15/23 [name], MPT Physical Therapist with [facility] 336 [phone] office [phone] mobile phone

## 2023-02-15 NOTE — Assessment & Plan Note (Signed)
-   Continue Synthroid -Check TSH in the a.m. 

## 2023-02-15 NOTE — Plan of Care (Signed)
  Problem: Acute Rehab PT Goals(only PT should resolve) Goal: Pt Will Go Supine/Side To Sit Outcome: Progressing Flowsheets (Taken 02/15/2023 1212) Pt will go Supine/Side to Sit:  with min guard assist  with minimal assist Goal: Patient Will Transfer Sit To/From Stand Outcome: Progressing Flowsheets (Taken 02/15/2023 1212) Patient will transfer sit to/from stand:  with min guard assist  with minimal assist Goal: Pt Will Transfer Bed To Chair/Chair To Bed Outcome: Progressing Flowsheets (Taken 02/15/2023 1212) Pt will Transfer Bed to Chair/Chair to Bed:  min guard assist  with min assist Goal: Pt Will Ambulate Outcome: Progressing Flowsheets (Taken 02/15/2023 1212) Pt will Ambulate:  25 feet  with minimal assist  with moderate assist  with rolling walker   12:12 PM, 02/15/23 [name], MPT Physical Therapist with [facility] 336 [phone] office [phone] mobile phone

## 2023-02-15 NOTE — Assessment & Plan Note (Signed)
-   Potassium 3.1 - Continue normal saline with potassium

## 2023-02-15 NOTE — Progress Notes (Signed)
Rapid response Rapid response was called due to patient having breakthrough seizures.  RN reported patient having seizure that lasted about 1.5 minutes.  She had a second seizure about 3 minutes after the first seizure.  At bedside, patient was in no acute distress.  IV Keppra 1 g was ordered to be given Decision was made for patient to be transferred to stepdown unit Patient had another seizure episode on arrival to the stepdown unit and prior to Keppra being given.  IV Ativan 2 mg x 1 was given.     Latest Ref Rng & Units 02/15/2023    8:41 PM 02/15/2023    4:23 AM 02/14/2023    3:31 PM  CBC  WBC 4.0 - 10.5 K/uL 5.5  5.9  8.2   Hemoglobin 12.0 - 15.0 g/dL 03.4  74.2  59.5   Hematocrit 36.0 - 46.0 % 39.3  34.2  38.0   Platelets 150 - 400 K/uL 288  251  263    Lactic Acid, Venous    Component Value Date/Time   LATICACIDVEN 4.5 (HH) 02/15/2023 2041      Latest Ref Rng & Units 02/15/2023    8:41 PM 02/15/2023    6:40 AM 02/14/2023    3:31 PM  CMP  Glucose 70 - 99 mg/dL 98  85  638   BUN 8 - 23 mg/dL 7  7  10    Creatinine 0.44 - 1.00 mg/dL 7.56  4.33  2.95   Sodium 135 - 145 mmol/L 135  138  133   Potassium 3.5 - 5.1 mmol/L 2.7  3.2  3.1   Chloride 98 - 111 mmol/L 98  104  96   CO2 22 - 32 mmol/L 24  26  26    Calcium 8.9 - 10.3 mg/dL 8.0  8.3  8.7   Total Protein 6.5 - 8.1 g/dL 7.4  6.7  7.2   Total Bilirubin 0.3 - 1.2 mg/dL 1.0  0.9  0.9   Alkaline Phos 38 - 126 U/L 72  65  77   AST 15 - 41 U/L 24  16  18    ALT 0 - 44 U/L 14  14  16      BP (!) 155/79 (BP Location: Left Arm)   Pulse 88   Temp (!) 97.4 F (36.3 C) (Oral)   Resp 20   Ht 5\' 2"  (1.575 m)   Wt 58.5 kg   SpO2 100%   BMI 23.59 kg/m   Assessment and plan Breakthrough seizures IV Ativan 2 mg x 1 was given IV Keppra 1 g was given EEG in the morning Consult teleneurology follow-up  Lactic acidosis possibly secondary to above Lactic acid 4.5, continue IV hydration Continue to trend lactic  acid  Hypokalemia K+ is 2.7 K+ will be replenished Please monitor for AM K+ for further replenishmemnt   Critical time: 43 minutes   Critical care personally provided  managing the patient due to high probability of clinically significant and life threatening deterioration. This critical care time included obtaining a history; examining the patient, pulse oximetry; ordering and review of studies; arranging urgent treatment with development of a management plan; evaluation of patient's response of treatment; frequent reassessment; and discussions with other providers.  This critical care time was performed to assess and manage the high probability of imminent and life threatening deterioration that could result in multi-organ failure.  Please refer to admission H&P and progress note for details regarding the care of this patient

## 2023-02-15 NOTE — Assessment & Plan Note (Signed)
-   Secondary to UTI?? - PT eval and treat - Continue to treat underlying infection - No acute neurodeficits

## 2023-02-15 NOTE — Progress Notes (Incomplete)
Progress Note       BP (!) 155/79 (BP Location: Left Arm)   Pulse 88   Temp (!) 97.4 F (36.3 C) (Oral)   Resp 20   Ht 5\' 2"  (1.575 m)   Wt 58.5 kg   SpO2 100%   BMI 23.59 kg/m

## 2023-02-15 NOTE — [place] FL2 (Signed)
[place] MEDICAID FL2 LEVEL OF CARE FORM     IDENTIFICATION  Patient Name: Leah Payne Birthdate: May 30, 1946 Sex: female Admission Date (Current Location): 02/14/2023  [facility] and [place] Number:  [employer] and Address:  [facility],  [address] [place] [address]      Provider Number: [id number]  Attending Physician Name and Address:  Catarina Hartshorn, MD  Relative Name and Phone Number:  Jerl Santos (Spouse)  [phone]    Current Level of Care: Hospital Recommended Level of Care: Skilled Nursing Facility Prior Approval Number:    Date Approved/Denied:   PASRR Number: [id number]  Discharge Plan: SNF    Current Diagnoses: Patient Active Problem List   Diagnosis Date Noted   Hypokalemia 02/15/2023   Gait instability 02/15/2023   Major neurocognitive disorder (HCC) 02/15/2023   Generalized weakness 02/14/2023   Asymmetrical hearing loss of right ear 02/25/2020   Vitamin D deficiency 03/21/2017   Dizziness 03/23/2016   Alzheimer disease (HCC) 12/17/2015   WPW (Wolff-Parkinson-White syndrome)    Type 2 diabetes, controlled, 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HCC) 09/17/2015   Hypothyroidism 09/17/2015   Debility    UTI (urinary tract infection) 08/29/2014   Overactive bladder 08/29/2014   Seizure disorder (HCC) 08/04/2013   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HCC) 08/04/2013   Essential hypertension 08/04/2013   Hyperlipidemia associated with type 2 diabetes mellitus (HCC) 08/04/2013    Orientation RESPIRATION BLADDER Height & Weight     Self  Normal Continent Weight: 58.5 kg Height:  5\' 2"  (157.5 cm)  BEHAVIORAL SYMPTOMS/MOOD NEUROLOGICAL BOWEL NUTRITION STATUS      Continent Diet (See DC summary)  AMBULATORY STATUS COMMUNICATION OF NEEDS Skin   Extensive Assist Verbally Normal                       Personal Care Assistance Level of Assistance  Bathing, Feeding, Dressing Bathing Assistance: Maximum assistance Feeding assistance:  Limited assistance Dressing Assistance: Maximum assistance     Functional Limitations Info  Sight, Hearing, Speech Sight Info: Impaired Hearing Info: Adequate Speech Info: Adequate    SPECIAL CARE FACTORS FREQUENCY  PT (By licensed PT)     PT Frequency: 5 Times a week              Contractures Contractures Info: Not present    Additional Factors Info  Code Status, Allergies, Psychotropic Code Status Info: DNR Allergies Info: NKDA Psychotropic Info: sertraline (ZOLOFT) tablet 100 mg         Current Medications (02/15/2023):  This is the current hospital active medication list Current Facility-Administered Medications  Medication Dose Route Frequency Provider Last Rate Last Admin   acetaminophen (TYLENOL) tablet 650 mg  650 mg Oral Q6H PRN Zierle-Ghosh, Asia B, DO       Or   acetaminophen (TYLENOL) suppository 650 mg  650 mg Rectal Q6H PRN Zierle-Ghosh, Asia B, DO       atorvastatin (LIPITOR) tablet 40 mg  40 mg Oral Daily Zierle-Ghosh, Asia B, DO   40 mg at 02/15/23 0907   [START ON 02/16/2023] cefTRIAXone (ROCEPHIN) 1 g in sodium chloride 0.9 % 100 mL IVPB  1 g Intravenous Q24H Zierle-Ghosh, Asia B, DO       donepezil (ARICEPT) tablet 10 mg  10 mg Oral QHS Zierle-Ghosh, Asia B, DO       fesoterodine ([name]) tablet 8 mg  8 mg Oral Daily Zierle-Ghosh, Asia B, DO   8 mg at 02/15/23 0907   heparin  injection 5,000 Units  5,000 Units Subcutaneous Q8H Zierle-Ghosh, Asia B, DO       insulin aspart (novoLOG) injection 0-9 Units  0-9 Units Subcutaneous TID WC Tat, Onalee Hua, MD       levETIRAcetam (KEPPRA) tablet 500 mg  500 mg Oral BID Zierle-Ghosh, Asia B, DO   500 mg at 02/15/23 1610   levothyroxine (SYNTHROID) tablet 25 mcg  25 mcg Oral Q0600 Zierle-Ghosh, Asia B, DO   25 mcg at 02/15/23 0645   mirabegron ER ([name]) tablet 50 mg  50 mg Oral Daily Zierle-Ghosh, Asia B, DO   50 mg at 02/15/23 0907   ondansetron (ZOFRAN) tablet 4 mg  4 mg Oral Q6H PRN Zierle-Ghosh, Asia B, DO        Or   ondansetron (ZOFRAN) injection 4 mg  4 mg Intravenous Q6H PRN Zierle-Ghosh, Asia B, DO       oxyCODONE (Oxy IR/ROXICODONE) immediate release tablet 5 mg  5 mg Oral Q4H PRN Zierle-Ghosh, Asia B, DO       sertraline (ZOLOFT) tablet 100 mg  100 mg Oral QHS Zierle-Ghosh, Asia B, DO         Discharge Medications: Please see discharge summary for a list of discharge medications.  Relevant Imaging Results:  Relevant Lab Results:   Additional Information SS# [id number]  Leitha Bleak, RN

## 2023-02-15 NOTE — Progress Notes (Signed)
Patient has been stable and alert all night.  Vitals have been stable.  Patient has had no complaints, other than the tightness of SCDs.  No new complaints this shift.

## 2023-02-15 NOTE — Assessment & Plan Note (Signed)
Continue Keppra.

## 2023-02-15 NOTE — Progress Notes (Signed)
Patient alert and oriented to self.  Patient does have a h/o dementia.  Patient skin is intact,although has redness to buttocks and perineal area that is MASD.  Patient cleaned. No complaints at this time.

## 2023-02-15 NOTE — Progress Notes (Signed)
*  PRELIMINARY RESULTS* Echocardiogram 2D Echocardiogram has been performed.  Earlie Server Christin Mccreedy 02/15/2023, 11:05 AM

## 2023-02-15 NOTE — TOC Initial Note (Signed)
Transition of Care [facility]) - Initial/Assessment Note    Patient Details  Name: Leah Payne MRN: [id number] Date of Birth: 07/02/1946  [facility]) CM/SW Contact:    Leitha Bleak, RN Phone Number: 02/15/2023, 10:52 AM  Clinical Narrative:        Patient is from [facility]. PT is recommending SNF. CM spoke with Roddie Mc ( owner). They agree she needs SNF before returning to family care home. She is requesting [facility]. FL2 completed and faxed, TOC reaching out to [facility] and will start INS AUTH.            Expected Discharge Plan: Skilled Nursing Facility Barriers to Discharge: Continued Medical Work up   Patient Goals and CMS Choice Patient states their goals for this hospitalization and ongoing recovery are:: to go to SNF CMS Medicare.gov Compare Post Acute Care list provided to:: Patient Represenative (must comment) Choice offered to / list presented to : Spouse      Expected Discharge Plan and Services       Living arrangements for the past 2 months: Assisted Living Facility                    Prior Living Arrangements/Services Living arrangements for the past 2 months: Assisted Living Facility Lives with:: Facility Resident Patient language and need for interpreter reviewed:: Yes        Need for Family Participation in Patient Care: Yes (Comment) Care giver support system in place?: Yes (comment) Current home services: DME Criminal Activity/Legal Involvement Pertinent to Current Situation/Hospitalization: No - Comment as needed  Activities of Daily Living      Permission Sought/Granted                  Emotional Assessment       Orientation: : Oriented to Self Alcohol / Substance Use: Not Applicable Psych Involvement: No (comment)  Admission diagnosis:  Dizziness [R42] Acute cystitis without hematuria [N30.00] Generalized weakness [R53.1] Patient Active Problem List   Diagnosis Date Noted   Hypokalemia 02/15/2023    Gait instability 02/15/2023   Major neurocognitive disorder (HCC) 02/15/2023   Generalized weakness 02/14/2023   Asymmetrical hearing loss of right ear 02/25/2020   Vitamin D deficiency 03/21/2017   Dizziness 03/23/2016   Alzheimer disease (HCC) 12/17/2015   WPW (Wolff-Parkinson-White syndrome)    Type 2 diabetes, controlled, 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HCC) 09/17/2015   Hypothyroidism 09/17/2015   Debility    UTI (urinary tract infection) 08/29/2014   Overactive bladder 08/29/2014   Seizure disorder (HCC) 08/04/2013   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HCC) 08/04/2013   Essential hypertension 08/04/2013   Hyperlipidemia associated with type 2 diabetes mellitus (HCC) 08/04/2013   PCP:  Tommie Sams, DO Pharmacy:   [facility] - [place], [place] - [address] [place] [address] Phone: [phone] Fax: [phone]  [id number] - [place], [place] - [address] AT [facility] OF [address] [place] [address] Phone: [phone] Fax: [phone]  [facility], [place] - [address] [place] [address] Phone: [phone] Fax: [phone]     Social Determinants of Health (SDOH) Social History: SDOH Screenings   Food Insecurity: No Food Insecurity (02/15/2023)  Housing: Patient Declined (02/15/2023)  Transportation Needs: Unknown (02/15/2023)  Utilities: Not At Risk (02/15/2023)  Alcohol Screen: Low Risk  (07/06/2021)  Depression (PHQ2-9): Low Risk  (  07/06/2021)  Financial Resource Strain: Low Risk  (07/06/2021)  Physical Activity: Inactive (07/06/2021)  Social Connections: Socially Integrated (07/06/2021)  Stress: No Stress Concern Present (07/06/2021)  Tobacco Use: Low Risk  (01/25/2023)   SDOH Interventions:     Readmission Risk Interventions     No data to display

## 2023-02-15 NOTE — Hospital Course (Addendum)
77 year old female with a history of dementia, diabetes mellitus type 2, hyperlipidemia, seizure disorder, hydrocephalus, hypothyroidism, WPW presenting with dizziness and gait instability.  The patient resides at [facility].  She states that at baseline she was able to ambulate without any assistance.  However in the past week she has noted some dizziness and unsteady gait.  Unclear if she has been started on any new medicines.  She has a difficult historian.  However, she denies any fevers, chills, visual disturbance or changes, neck pain, chest pain, shortness of breath, nausea, vomiting, diarrhea, abdominal pain.  She has a bifrontal headache which she says has been on and off.  She denies any dysuria or hematuria. In the ED, the patient was afebrile and hemodynamically stable with oxygen saturation 100% room air.  WBC 5.9, hemoglobin 11.4, platelets 251,000.  Sodium 133, potassium 5.1, bicarbonate 26, serum creatinine 0.59.  EKG shows sinus rhythm with nonspecific ST changes.  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  The patient was admitted for further evaluation and treatment of her dizziness and unsteady gait.  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  There was persistent severe dilatation of the third and lateral ventricles.  MRA of the brain was negative for LVO.  There is no hemodynamic significant stenosis of the carotids. She was seen by neurology who recommended aspirin 81 mg daily and atorvastatin.  In the evening of 02/15/2023, the patient had an initial seizure lasting 1.5 minutes.  About 10 minutes later, she had second seizure lasting 1 minute.  Prior to receiving her Keppra load, the patient had a third seizure after which the patient received Ativan.  There were no further seizures after Keppra load of 1 gram.  EEG on 02/16/23 showed  rare sharp transients in the left hemisphere but could not be certain if those were artifactual.  -discussed with neurology, Dr. Marletta Lor remains confused, not back to baseline with continued left gaze preference.  Concerned about continued focal seziure>>transfer to [facility] for LTM

## 2023-02-15 NOTE — Progress Notes (Addendum)
1925 patient came over with RRT team actively having a seizure switching and shaking all over. 1927 patient began to speak and follow commands patient placed back on 2L [place] 1929 patient eyes started to blink rapidly while asking patient to squeeze hands her fist locked up and she arms started to shake  1930 2mg  Ativan given and keppra started seizures stopped 1945 CHG completed patient noted to have extensive redness to vaginal area and sacrum cleansed and antifungal applied pur wick removed and will not replace  right hand patient as 2 thumbs 2150 K 2.7 5 K-runs ordered 2200 unable to give any PO medications including PO Keppra patient not  appropriate to swallow   02/16/23 0300 patient retaining urine bladder scanned at >595 order to in and out 500 ml clear yellow urine noted pt tolerated well repeats questions being asked responds top name and follows commands patient not moving right side with all commands intermitted unable to answer any question with anything other than yes or no and not consistent with that  patient remains inconsistent would not give PO anything at this time

## 2023-02-15 NOTE — Progress Notes (Signed)
At approx called into room by tech. Pt head of bed was let down. Pt became unresponsive and shaking (tremors) Pt has hx of seizures. Last approx 1.5 minutes from start of shaking per tech. MD Adefeso contacted and informed. Following seizure pt back to baseline just fatigue noted postictal behavior. MD Requested to give po keppra. Pt had another seizure at time of MD response approx 10 min later lasting 1 min after head of bed let down again. Rapid called. MD adefeso arrived at bedside. ordered stat keppra 1000mg  IV. Pt moved to unit at approx 2018 due to seizures and frequency. Attempted to give report at 2019 awaiting [facility] RN availability.

## 2023-02-15 NOTE — Progress Notes (Addendum)
Marland Kitchen          PROGRESS NOTE  Leah Payne BJY:[id number] DOB: Dec 15, 1945 DOA: 02/14/2023 PCP: Tommie Sams, DO  Brief History:  77 year old female with a history of dementia, diabetes mellitus type 2, hyperlipidemia, seizure disorder, hydrocephalus, hypothyroidism, WPW presenting with dizziness and gait instability.  The patient resides at [facility].  She states that at baseline she was able to ambulate without any assistance.  However in the past week she has noted some dizziness and unsteady gait.  Unclear if she has been started on any new medicines.  She has a difficult historian.  However, she denies any fevers, chills, visual disturbance or changes, neck pain, chest pain, shortness of breath, nausea, vomiting, diarrhea, abdominal pain.  She has a bifrontal headache which she says has been on and off.  She denies any dysuria or hematuria. In the ED, the patient was afebrile and hemodynamically stable with oxygen saturation 100% room air.  WBC 5.9, hemoglobin 11.4, platelets 251,000.  Sodium 133, potassium 5.1, bicarbonate 26, serum creatinine 0.59.  EKG shows sinus rhythm with nonspecific ST changes.  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  The patient was admitted for further evaluation and treatment of her dizziness and unsteady gait.   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 -Check orthostatics  UTI -UA > 50 WBC -continue ceftriaxone pending culture data  Hypothyroidism -Continue levothyroxine  Mixed hyperlipidemia -Continue statin  Seizure disorder -Continue Keppra  Controlled diabetes mellitus type 2 -NovoLog sliding scale -Check hemoglobin A1c  Major neurocognitive  disorder -Continue Aricept  Hypokalemia/hypomagnesemia -Repleting         Family Communication:   no Family at bedside  Consultants:  none  Code Status:  DNR  DVT Prophylaxis:  [place] Heparin   Procedures: As Listed in Progress Note Above  Antibiotics: Ceftriaxone 7/23>>     Total time spent 50 minutes.  Greater than 50% spent face to face counseling and coordinating care.    Subjective: Patient denies fevers, chills, headache, chest pain, dyspnea, nausea, vomiting, diarrhea, abdominal pain, dysuria, hematuria, hematochezia, and melena.   Objective: Vitals:   02/14/23 2200 02/14/23 2335 02/15/23 0035 02/15/23 0327  BP: 132/88 (!) 154/73 (!) 154/84 (!) 171/96  Pulse: 83 76 83 78  Resp: (!) 23  18 18   Temp:   98.2 F (36.8 C) 98.1 F (36.7 C)  TempSrc:      SpO2: 98% 99% 100% 100%  Weight:      Height:        Intake/Output Summary (Last 24 hours) at 02/15/2023 0745 Last data filed at 02/15/2023 0500 Gross per 24 hour  Intake 620 ml  Output --  Net 620 ml   Weight change:  Exam:  General:  Pt is alert, follows commands appropriately, not in acute distress HEENT: No icterus, No thrush, No neck mass, [place]/AT Cardiovascular: RRR, S1/S2, no rubs, no gallops Respiratory: CTA bilaterally, no wheezing, no crackles, no rhonchi Abdomen: Soft/+BS, non tender, non distended, no guarding Extremities: No edema, No lymphangitis, No petechiae, No rashes, no synovitis Neuro:  CN II-XII intact, strength 4/5 in RUE, RLE, strength 4/5 LUE, LLE; sensation intact bilateral; no dysmetria; babinski upgoing    Data Reviewed: I have personally reviewed following labs  and imaging studies Basic Metabolic Panel: Recent Labs  Lab 02/14/23 1531 02/15/23 0640  NA 133* 138  K 3.1* 3.2*  CL 96* 104  CO2 26 26  GLUCOSE 117* 85  BUN 10 7*  CREATININE 0.59 0.45  CALCIUM 8.7* 8.3*  MG  --  1.5*   Liver Function Tests: Recent Labs  Lab 02/14/23 1531 02/15/23 0640  AST  18 16  ALT 16 14  ALKPHOS 77 65  BILITOT 0.9 0.9  PROT 7.2 6.7  ALBUMIN 3.3* 3.0*   No results for input(s): "LIPASE", "AMYLASE" in the last 168 hours. No results for input(s): "AMMONIA" in the last 168 hours. Coagulation Profile: No results for input(s): "INR", "PROTIME" in the last 168 hours. CBC: Recent Labs  Lab 02/14/23 1531 02/15/23 0423  WBC 8.2 5.9  NEUTROABS  --  3.5  HGB 12.6 11.4*  HCT 38.0 34.2*  MCV 88.0 88.4  PLT 263 251   Cardiac Enzymes: No results for input(s): "CKTOTAL", "CKMB", "CKMBINDEX", "TROPONINI" in the last 168 hours. BNP: Invalid input(s): "POCBNP" CBG: No results for input(s): "GLUCAP" in the last 168 hours. HbA1C: No results for input(s): "HGBA1C" in the last 72 hours. Urine analysis:    Component Value Date/Time   COLORURINE YELLOW 02/14/2023 2137   APPEARANCEUR CLOUDY (A) 02/14/2023 2137   LABSPEC 1.010 02/14/2023 2137   PHURINE 6.0 02/14/2023 2137   GLUCOSEU NEGATIVE 02/14/2023 2137   HGBUR SMALL (A) 02/14/2023 2137   BILIRUBINUR NEGATIVE 02/14/2023 2137   BILIRUBINUR neg 03/07/2018 1020   KETONESUR 5 (A) 02/14/2023 2137   PROTEINUR 30 (A) 02/14/2023 2137   UROBILINOGEN 0.2 03/07/2018 1020   UROBILINOGEN 1.0 08/29/2014 1206   NITRITE POSITIVE (A) 02/14/2023 2137   LEUKOCYTESUR LARGE (A) 02/14/2023 2137   Sepsis Labs: @LABRCNTIP (procalcitonin:4,lacticidven:4) )No results found for this or any previous visit (from the past 240 hour(s)).   Scheduled Meds:  atorvastatin  40 mg Oral Daily   donepezil  10 mg Oral QHS   fesoterodine  8 mg Oral Daily   heparin  5,000 Units Subcutaneous Q8H   insulin aspart  0-15 Units Subcutaneous TID WC   levETIRAcetam  500 mg Oral BID   levothyroxine  25 mcg Oral Q0600   mirabegron ER  50 mg Oral Daily   sertraline  100 mg Oral QHS   Continuous Infusions:  0.9 % NaCl with KCl 40 mEq / L 100 mL/hr at 02/15/23 0114   [START ON 02/16/2023] cefTRIAXone (ROCEPHIN)  IV       Procedures/Studies: CT Head Wo Contrast  Result Date: 02/14/2023 CLINICAL DATA:  Vertigo, central. EXAM: CT HEAD WITHOUT CONTRAST TECHNIQUE: Contiguous axial images were obtained from the base of the skull through the vertex without intravenous contrast. RADIATION DOSE REDUCTION: This exam was performed according to the departmental dose-optimization program which includes automated exposure control, adjustment of the mA and/or kV according to patient size and/or use of iterative reconstruction technique. COMPARISON:  Head CT 02/22/2018. FINDINGS: Brain: No acute hemorrhage. 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 No extra-axial collection. Vascular: No hyperdense vessel or unexpected calcification. Skull: No calvarial fracture or suspicious bone lesion. Skull base is unremarkable. Sinuses/Orbits: No acute finding. Other: None. IMPRESSION: 1. No acute intracranial abnormality. 2. 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 Electronically Signed   By: Orvan Falconer M.D.   On: 02/14/2023 16:11   DG Chest Port 1 View  Result Date: 01/24/2023  CLINICAL DATA:  Chronic cough. EXAM: PORTABLE CHEST 1 VIEW COMPARISON:  11/15/2020 FINDINGS: Lung apices obscured by the patient's face. The lungs are clear without focal pneumonia, edema, pneumothorax or pleural effusion. Cardiopericardial silhouette is at upper limits of normal for size. No acute bony abnormality. Telemetry leads overlie the chest. IMPRESSION: No active disease. Electronically Signed   By: Kennith Center M.D.   On: 01/24/2023 12:23    Catarina Hartshorn, DO  [facility]  If 7PM-7AM, please contact night-coverage [email or web address] Password [email or web address] 02/15/2023, 7:45 AM   LOS: 0 days

## 2023-02-15 NOTE — H&P (Signed)
History and Physical    Patient: Leah Payne VQQ:[id number] DOB: 1945/09/19 DOA: 02/14/2023 DOS: the patient was seen and examined on 02/15/2023 PCP: Tommie Sams, DO  Patient coming from: ALF/ILF  Chief Complaint:  Chief Complaint  Patient presents with   Dizziness   HPI: Leah Payne is a 77 y.o. female with medical history significant of dementia, diabetes mellitus type 2, hydrocephalus, hyperlipidemia, seizure disorder, and more presents ED with a chief complaint of dizziness.  Patient does have a history of dementia, and is unsure why she is here.  When reminded that she had complained of dizziness she says yes that is why she is here.  History was difficult to obtain.  She does not think she had any headache, nausea, vomiting.  She does report having trouble ambulating.  Patient reports that she ambulates independently at baseline.  It seems she has had many complaints of generalized weakness from previous visits.  She is often diagnosed with an infection.  Patient denies any chest pain or palpitations.  She seems to be at her baseline mentation with orientation to person, place and to year and when given time to think about it.  Patient has no other complaints at this time.  Patient does not smoke, does not drink.  Patient has ACP documents that so she would not want a feeding tube, would not want intubation.  She is DNR. Review of Systems: As mentioned in the history of present illness. All other systems reviewed and are negative. 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 She has never used smokeless tobacco. She reports that she does not drink alcohol and does not use drugs.  No Known Allergies  Family History  Problem Relation Age of Onset   Stroke Mother 45   Pneumonia Mother    CAD Father 62   Alzheimer's disease Father    Pneumonia Father    Cancer Maternal Aunt        lung (non-smoker)   Diabetes Neg Hx     Prior to Admission medications   Medication Sig Start Date End Date Taking? Authorizing Provider  Accu-Chek FastClix Lancets MISC Use as instructed to check blood sugar once daily 09/06/19   Eustaquio Boyden, MD  Alcohol Swabs (B-D SINGLE USE SWABS REGULAR) PADS Use as instructed to check blood sugar once daily 09/06/19   Eustaquio Boyden, MD  atorvastatin (LIPITOR) 40 MG tablet TAKE (1) TABLET BY MOUTH EVERY EVENING. 06/21/22   Babs Sciara, MD  Blood Glucose Monitoring Suppl (ACCU-CHEK GUIDE) w/Device KIT 1 each by Does not apply route as directed. Use as instructed to check blood sugar once daily 09/06/19   Eustaquio Boyden, MD  cephALEXin (KEFLEX) 500 MG capsule Take 1 capsule (500 mg total) by mouth 4 (four)  times daily. 01/25/23   Vanetta Mulders, MD  Cholecalciferol (VITAMIN D) 50 MCG (2000 UT) CAPS Take 1 capsule (2,000 Units total) by mouth daily. 02/02/19   Eustaquio Boyden, MD  donepezil (ARICEPT) 10 MG tablet TAKE (1) TABLET BY MOUTH EVERY MORNING. 06/21/22   Babs Sciara, MD  glucose blood (ACCU-CHEK GUIDE) test strip USE TO CHECK BLOOD SUGAR ONCE DAILY. 12/15/22   Tommie Sams, DO  levETIRAcetam (KEPPRA) 500 MG tablet TAKE (1) TABLET BY MOUTH TWICE DAILY. 06/21/22   Babs Sciara, MD  levothyroxine (SYNTHROID) 25 MCG tablet TAKE (1)  TABLET BY MOUTH ONCE DAILY BEFORE BREAKFAST. 06/21/22   Babs Sciara, MD  metFORMIN (GLUCOPHAGE) 500 MG tablet TAKE (1) TABLET BY MOUTH EVERY MORNING. 06/21/22   Babs Sciara, MD  [name] 50 MG TB24 tablet TAKE (1) TABLET BY MOUTH AT BEDTIME. 06/21/22   Luking, Jonna Coup, MD  propranolol (INDERAL) 10 MG tablet TAKE (1) TABLET BY MOUTH TWICE DAILY. 06/21/22   Babs Sciara, MD  sertraline (ZOLOFT) 100 MG tablet TAKE (1) TABLET BY MOUTH EVERY MORNING. 06/21/22   Luking, Jonna Coup, MD  [name] 8 MG TB24 tablet TAKE (1) TABLET BY MOUTH ONCE DAILY. 06/21/22   Babs Sciara, MD    Physical Exam: Vitals:   02/14/23 2200 02/14/23 2335 02/15/23 0035 02/15/23 0327  BP: 132/88 (!) 154/73 (!) 154/84 (!) 171/96  Pulse: 83 76 83 78  Resp: (!) 23  18 18   Temp:   98.2 F (36.8 C) 98.1 F (36.7 C)  TempSrc:      SpO2: 98% 99% 100% 100%  Weight:      Height:       1.  General: Patient lying supine in bed,  no acute distress   2. Psychiatric: Alert and oriented x 3, mood and behavior normal for situation, pleasant and cooperative with exam   3. Neurologic: Speech and language are normal, face is symmetric, moves all 4 extremities voluntarily, at baseline without acute deficits on limited exam   4. HEENMT:  Head is atraumatic, normocephalic, pupils reactive to light, neck is supple, trachea is midline, mucous membranes are moist   5. Respiratory : Lungs are clear to auscultation bilaterally without wheezing, rhonchi, rales, no cyanosis, no increase in work of breathing or accessory muscle use   6. Cardiovascular : Heart rate normal, rhythm is regular, no rubs or gallops, no peripheral edema, peripheral pulses palpated   7. Gastrointestinal:  Abdomen is soft, nondistended, nontender to palpation bowel sounds active, no masses or organomegaly palpated   8. Skin:  Skin is warm, dry and intact without rashes, acute lesions, or ulcers on limited exam   9.Musculoskeletal:  No acute  deformities or trauma, no asymmetry in tone, no peripheral edema, peripheral pulses palpated, no tenderness to palpation in the extremities  Data Reviewed: In the ED Temp 97.8, heart rate 83-90, respiratory rate 16-24, blood pressure 132/86-180/90 No leukocytosis with white blood cell count of 8.2, hemoglobin 12.6 Chemistry reveals a hypokalemia at 3.1 Albumin 3.3 UA is indicative of UTI Urine culture pending Patient started on Rocephin CT head shows no acute intracranial abnormality EKG shows a heart rate of 92 sinus rhythm, QTc 466 Plan was to send patient home but when they got her up to ambulate her she was not able to ambulate on her own and per the ED provider facility could not take her back because of that  Assessment and Plan: * Generalized weakness - Secondary  to UTI?? - PT eval and treat - Continue to treat underlying infection - No acute neurodeficits  Hypokalemia - Potassium 3.1 - Continue normal saline with potassium  Hypothyroidism - Continue Synthroid - Check TSH in the a.m.  Type 2 diabetes, controlled, 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HCC) - Hold metformin - Sliding scale coverage - Last hemoglobin A1c controlled  UTI (urinary tract infection) - Recently treated outpatient with Keflex - Rocephin started in the ED continue Rocephin - Urine culture pending - No SIRS criteria, patient does have dizziness  Hyperlipidemia associated with type 2 diabetes mellitus (HCC) - Continue statin  Seizure disorder (HCC) - Continue Keppra      Advance Care Planning:   Code Status: DNR  Consults: None at this time  Family Communication: No family at bedside  Severity of Illness: The appropriate patient status for this patient is OBSERVATION. Observation status is judged to be reasonable and necessary in order to provide the required intensity of service to ensure the patient's safety. The patient's presenting symptoms, physical exam findings, and initial  radiographic and laboratory data in the context of their medical condition is felt to place them at decreased risk for further clinical deterioration. Furthermore, it is anticipated that the patient will be medically stable for discharge from the hospital within 2 midnights of admission.   Author: Lilyan Gilford, DO 02/15/2023 4:39 AM  For on call review [email or web address].

## 2023-02-15 NOTE — Assessment & Plan Note (Signed)
-   Recently treated outpatient with Keflex - Rocephin started in the ED continue Rocephin - Urine culture pending - No SIRS criteria, patient does have dizziness

## 2023-02-15 NOTE — Progress Notes (Signed)
Per Ed report, patient is from [facility] in [place].  ED nurse spoke with staff at facility.  They state patient has had a decline in mobility and has been unable to ambulate for the past few days.  Per ED Nurse, facility will not accept patient back unless she is able to ambulate on her own.

## 2023-02-15 NOTE — Assessment & Plan Note (Signed)
Continue statin. 

## 2023-02-15 NOTE — Progress Notes (Signed)
Mobility Specialist Progress Note:    02/15/23 1310  Mobility  Activity Transferred to/from [facility]  Level of Assistance Maximum assist, patient does 25-49%  Assistive Device Front wheel walker  Distance Ambulated (ft) 2 ft  Range of Motion/Exercises Active;All extremities  Activity Response Tolerated well  Mobility Referral No  $Mobility charge 1 Mobility  Mobility Specialist Start Time (ACUTE ONLY) 1310  Mobility Specialist Stop Time (ACUTE ONLY) 1318  Mobility Specialist Time Calculation (min) (ACUTE ONLY) 8 min   Pt requested assistance to BR. ModA required to sit EOB, MaxA required to stand and pivot to [facility]. Tolerated well, asx throughout. Returned pt back to bed, call bell in reach, all needs met.  Feliciana Rossetti Mobility Specialist Please contact via Special educational needs teacher or  Rehab office at [phone]

## 2023-02-16 ENCOUNTER — Inpatient Hospital Stay (HOSPITAL_COMMUNITY)
Admit: 2023-02-16 | Discharge: 2023-02-16 | Disposition: A | Discharge status: Home / Self Care | Payer: 59 | Attending: Neurology | Admitting: Neurology

## 2023-02-16 DIAGNOSIS — E785 Hyperlipidemia, unspecified: Secondary | ICD-10-CM | POA: Diagnosis not present

## 2023-02-16 DIAGNOSIS — F0390 Unspecified dementia without behavioral disturbance: Secondary | ICD-10-CM | POA: Diagnosis present

## 2023-02-16 DIAGNOSIS — Z7401 Bed confinement status: Secondary | ICD-10-CM | POA: Diagnosis not present

## 2023-02-16 DIAGNOSIS — G40909 Epilepsy, unspecified, not intractable, without status epilepticus: Secondary | ICD-10-CM | POA: Diagnosis present

## 2023-02-16 DIAGNOSIS — B9620 Unspecified Escherichia coli [E. coli] as the cause of diseases classified elsewhere: Secondary | ICD-10-CM | POA: Diagnosis present

## 2023-02-16 DIAGNOSIS — N3000 Acute cystitis without hematuria: Secondary | ICD-10-CM | POA: Diagnosis not present

## 2023-02-16 DIAGNOSIS — R569 Unspecified convulsions: Secondary | ICD-10-CM

## 2023-02-16 DIAGNOSIS — G40919 Epilepsy, unspecified, intractable, without status epilepticus: Secondary | ICD-10-CM | POA: Diagnosis not present

## 2023-02-16 DIAGNOSIS — Q030 Malformations of aqueduct of Sylvius: Secondary | ICD-10-CM | POA: Diagnosis not present

## 2023-02-16 DIAGNOSIS — I639 Cerebral infarction, unspecified: Secondary | ICD-10-CM | POA: Diagnosis present

## 2023-02-16 DIAGNOSIS — R531 Weakness: Secondary | ICD-10-CM | POA: Diagnosis not present

## 2023-02-16 DIAGNOSIS — I10 Essential (primary) hypertension: Secondary | ICD-10-CM | POA: Diagnosis present

## 2023-02-16 DIAGNOSIS — Q039 Congenital hydrocephalus, unspecified: Secondary | ICD-10-CM | POA: Diagnosis not present

## 2023-02-16 DIAGNOSIS — G459 Transient cerebral ischemic attack, unspecified: Secondary | ICD-10-CM | POA: Diagnosis not present

## 2023-02-16 DIAGNOSIS — E782 Mixed hyperlipidemia: Secondary | ICD-10-CM | POA: Diagnosis present

## 2023-02-16 DIAGNOSIS — R42 Dizziness and giddiness: Secondary | ICD-10-CM | POA: Diagnosis present

## 2023-02-16 DIAGNOSIS — N390 Urinary tract infection, site not specified: Secondary | ICD-10-CM | POA: Diagnosis present

## 2023-02-16 DIAGNOSIS — E876 Hypokalemia: Secondary | ICD-10-CM | POA: Diagnosis present

## 2023-02-16 DIAGNOSIS — F05 Delirium due to known physiological condition: Secondary | ICD-10-CM | POA: Diagnosis present

## 2023-02-16 DIAGNOSIS — R2681 Unsteadiness on feet: Secondary | ICD-10-CM | POA: Diagnosis not present

## 2023-02-16 DIAGNOSIS — I6389 Other cerebral infarction: Secondary | ICD-10-CM | POA: Diagnosis present

## 2023-02-16 DIAGNOSIS — Z66 Do not resuscitate: Secondary | ICD-10-CM | POA: Diagnosis present

## 2023-02-16 DIAGNOSIS — E872 Acidosis, unspecified: Secondary | ICD-10-CM | POA: Diagnosis present

## 2023-02-16 DIAGNOSIS — Z79899 Other long term (current) drug therapy: Secondary | ICD-10-CM | POA: Diagnosis not present

## 2023-02-16 DIAGNOSIS — A4151 Sepsis due to Escherichia coli [E. coli]: Secondary | ICD-10-CM | POA: Diagnosis present

## 2023-02-16 DIAGNOSIS — E11649 Type 2 diabetes mellitus with hypoglycemia without coma: Secondary | ICD-10-CM | POA: Diagnosis present

## 2023-02-16 DIAGNOSIS — E538 Deficiency of other specified B group vitamins: Secondary | ICD-10-CM | POA: Diagnosis present

## 2023-02-16 DIAGNOSIS — E039 Hypothyroidism, unspecified: Secondary | ICD-10-CM | POA: Diagnosis present

## 2023-02-16 DIAGNOSIS — E1142 Type 2 diabetes mellitus with diabetic polyneuropathy: Secondary | ICD-10-CM | POA: Diagnosis present

## 2023-02-16 DIAGNOSIS — R6520 Severe sepsis without septic shock: Secondary | ICD-10-CM | POA: Diagnosis present

## 2023-02-16 DIAGNOSIS — E1169 Type 2 diabetes mellitus with other specified complication: Secondary | ICD-10-CM | POA: Diagnosis present

## 2023-02-16 DIAGNOSIS — Z8249 Family history of ischemic heart disease and other diseases of the circulatory system: Secondary | ICD-10-CM | POA: Diagnosis not present

## 2023-02-16 LAB — BASIC METABOLIC PANEL
Anion gap: 9 (ref 5–15)
CO2: 22 mmol/L (ref 22–32)
Chloride: 102 mmol/L (ref 98–111)
Creatinine, Ser: 0.56 mg/dL (ref 0.44–1.00)
GFR, Estimated: >60 mL/min (ref >60)
Glucose, Bld: 98 mg/dL (ref 70–99)
Potassium: 4 mmol/L (ref 3.5–5.1)

## 2023-02-16 LAB — URINE CULTURE

## 2023-02-16 LAB — GLUCOSE, CAPILLARY
Glucose-Capillary: 92 mg/dL (ref 70–99)
Glucose-Capillary: 99 mg/dL (ref 70–99)
Glucose-Capillary: 76 mg/dL (ref 70–99)
Glucose-Capillary: 84 mg/dL (ref 70–99)

## 2023-02-16 LAB — LACTIC ACID, PLASMA: Lactic Acid, Venous: 0.8 mmol/L (ref 0.5–1.9)

## 2023-02-16 MED ORDER — LEVETIRACETAM IN NACL 1000 MG/100ML IV SOLN: 1000.0000 mg | Freq: Two times a day (BID) | INTRAVENOUS | Status: DC

## 2023-02-16 MED ORDER — LEVETIRACETAM 750 MG PO TABS
750.0000 mg | ORAL_TABLET | Freq: Two times a day (BID) | ORAL | Status: DC
  Administered 2023-02-16 – 2023-02-19 (×7): 750 mg via ORAL
  Filled 2023-02-16 (×8): qty 1

## 2023-02-16 MED ORDER — FOLIC ACID 5 MG/ML IJ SOLN
1.0000 mg | Freq: Once | INTRAMUSCULAR | Status: AC
  Administered 2023-02-16: 1 mg via INTRAVENOUS
  Filled 2023-02-16: qty 0.2

## 2023-02-16 MED ORDER — LEVETIRACETAM 500 MG PO TABS
500.0000 mg | ORAL_TABLET | Freq: Once | ORAL | Status: AC
  Administered 2023-02-16: 500 mg via ORAL

## 2023-02-16 MED ORDER — SODIUM CHLORIDE 0.9 % IV SOLN: 1.0000 mg | Freq: Once | INTRAVENOUS | Status: DC

## 2023-02-16 MED ORDER — VITAMIN B-12 100 MCG PO TABS
500.0000 ug | ORAL_TABLET | Freq: Every day | ORAL | Status: DC
  Administered 2023-02-17 – 2023-02-20 (×4): 500 ug via ORAL
  Filled 2023-02-16 (×4): qty 5

## 2023-02-16 MED ORDER — SODIUM CHLORIDE 0.9 % IV SOLN
750.0000 mg | Freq: Two times a day (BID) | INTRAVENOUS | Status: DC
  Administered 2023-02-20: 750 mg via INTRAVENOUS
  Filled 2023-02-16 (×11): qty 7.5

## 2023-02-16 MED ORDER — CLOPIDOGREL BISULFATE 75 MG PO TABS
75.0000 mg | ORAL_TABLET | Freq: Every day | ORAL | Status: DC
  Administered 2023-02-16 – 2023-02-20 (×5): 75 mg via ORAL
  Filled 2023-02-16 (×5): qty 1

## 2023-02-16 MED ORDER — CYANOCOBALAMIN 1000 MCG/ML IJ SOLN
1000.0000 ug | Freq: Once | INTRAMUSCULAR | Status: AC
  Administered 2023-02-16: 1000 ug via INTRAMUSCULAR
  Filled 2023-02-16: qty 1

## 2023-02-16 MED ORDER — LEVETIRACETAM IN NACL 500 MG/100ML IV SOLN: 500.0000 mg | Freq: Once | INTRAVENOUS | Status: DC

## 2023-02-16 NOTE — Progress Notes (Signed)
PROGRESS NOTE  Leah Payne ZDG:[id number] DOB: 1946/05/15 DOA: 02/14/2023 PCP: Tommie Sams, DO  Brief History:  77 year old female with a history of dementia, diabetes mellitus type 2, hyperlipidemia, seizure disorder, hydrocephalus, hypothyroidism, WPW presenting with dizziness and gait instability.  The patient resides at [facility].  She states that at baseline she was able to ambulate without any assistance.  However in the past week she has noted some dizziness and unsteady gait.  Unclear if she has been started on any new medicines.  She has a difficult historian.  However, she denies any fevers, chills, visual disturbance or changes, neck pain, chest pain, shortness of breath, nausea, vomiting, diarrhea, abdominal pain.  She has a bifrontal headache which she says has been on and off.  She denies any dysuria or hematuria. In the ED, the patient was afebrile and hemodynamically stable with oxygen saturation 100% room air.  WBC 5.9, hemoglobin 11.4, platelets 251,000.  Sodium 133, potassium 5.1, bicarbonate 26, serum creatinine 0.59.  EKG shows sinus rhythm with nonspecific ST changes.  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  The patient was admitted for further evaluation and treatment of her dizziness and unsteady gait.   Assessment/Plan:  Generalized weakness/dizziness/gait stability -Secondary to UTI -B12--167>>replete -Foic acid--4.3>>replete -TSH--1.691 -MRI brain--acute infarct in the right middle frontal gyrus.  Persistent severe dilatation of the third and lateral ventricles. 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 -Appreciate neurology consult>> start  aspirin 81 mg daily and Lipitor 40 mg daily -PT evaluation  Seizure disorder -Continue Keppra -Patient had 3 seizures on the evening of 02/15/2023 -02/16/23 AM--pt awake and following commands.  Answering questions but slow to respond -reconsult neurology -increase baseline keppra to 1 g bid -EEG   UTI -UA > 50 WBC -continue ceftriaxone pending culture data   Hypothyroidism -Continue levothyroxine   Mixed hyperlipidemia -Continue statin   Controlled diabetes mellitus type 2 -NovoLog sliding scale -Check hemoglobin A1c -- 5.6   Major neurocognitive disorder -Continue Aricept   Hypokalemia/hypomagnesemia -Repleting                 Family Communication:   no Family at bedside   Consultants:  none   Code Status:  DNR   DVT Prophylaxis:  [place] Heparin     Procedures: As Listed in Progress Note Above   Antibiotics: Ceftriaxone 7/23>>        Subjective:   Objective: Vitals:   02/16/23 0500 02/16/23 0600 02/16/23 0700 02/16/23 0809  BP: (!) 156/77 134/64 (!) 135/58   Pulse: 86 87 86   Resp: 15  19   Temp:    99.1 F (37.3 C)  TempSrc:    Axillary  SpO2: 100% 100% 100%   Weight:      Height:        Intake/Output Summary (Last 24 hours) at 02/16/2023 0836 Last data filed at 02/16/2023 0400 Gross per 24 hour  Intake 632.49 ml  Output 500 ml  Net 132.49 ml   Weight change: -7.014 kg Exam:  General:  Pt is alert, follows commands appropriately, not in acute distress HEENT: No icterus, No thrush, No neck mass, [place]/AT Cardiovascular: RRR,  S1/S2, no rubs, no gallops Respiratory: bibasilar crackles.  No wheeze Abdomen: Soft/+BS, non tender, non distended, no guarding Extremities: No edema, No lymphangitis, No petechiae, No rashes, no synovitis Neuro:  CN II-XII intact, strength 3+/5 in RUE, 4-/5 RLE, strength 4-/5 LUE, LLE; sensation intact bilateral; no dysmetria; babinski equivocal    Data Reviewed: I have personally reviewed following labs  and imaging studies Basic Metabolic Panel: Recent Labs  Lab 02/14/23 1531 02/15/23 0640 02/15/23 2041 02/16/23 0532  NA 133* 138 135 133*  K 3.1* 3.2* 2.7* 4.0  CL 96* 104 98 102  CO2 26 26 24 22   GLUCOSE 117* 85 98 98  BUN 10 7* 7* 7*  CREATININE 0.59 0.45 0.58 0.56  CALCIUM 8.7* 8.3* 8.0* 7.9*  MG  --  1.5* 1.9 2.0   Liver Function Tests: Recent Labs  Lab 02/14/23 1531 02/15/23 0640 02/15/23 2041  AST 18 16 24   ALT 16 14 14   ALKPHOS 77 65 72  BILITOT 0.9 0.9 1.0  PROT 7.2 6.7 7.4  ALBUMIN 3.3* 3.0* 3.3*   No results for input(s): "LIPASE", "AMYLASE" in the last 168 hours. No results for input(s): "AMMONIA" in the last 168 hours. Coagulation Profile: No results for input(s): "INR", "PROTIME" in the last 168 hours. CBC: Recent Labs  Lab 02/14/23 1531 02/15/23 0423 02/15/23 2041  WBC 8.2 5.9 5.5  NEUTROABS  --  3.5  --   HGB 12.6 11.4* 12.8  HCT 38.0 34.2* 39.3  MCV 88.0 88.4 88.7  PLT 263 251 288   Cardiac Enzymes: No results for input(s): "CKTOTAL", "CKMB", "CKMBINDEX", "TROPONINI" in the last 168 hours. BNP: Invalid input(s): "POCBNP" CBG: Recent Labs  Lab 02/15/23 0751 02/15/23 1138 02/15/23 1625 02/15/23 2003 02/16/23 0806  GLUCAP 80 89 162* 138* 92   HbA1C: Recent Labs    02/15/23 0423  HGBA1C 5.6   Urine analysis:    Component Value Date/Time   COLORURINE YELLOW 02/14/2023 2137   APPEARANCEUR CLOUDY (A) 02/14/2023 2137   LABSPEC 1.010 02/14/2023 2137   PHURINE 6.0 02/14/2023 2137   GLUCOSEU NEGATIVE 02/14/2023 2137   HGBUR SMALL (A) 02/14/2023 2137   BILIRUBINUR NEGATIVE 02/14/2023 2137   BILIRUBINUR neg 03/07/2018 1020   KETONESUR 5 (A) 02/14/2023 2137   PROTEINUR 30 (A) 02/14/2023 2137   UROBILINOGEN 0.2 03/07/2018 1020   UROBILINOGEN 1.0 08/29/2014 1206   NITRITE POSITIVE (A) 02/14/2023 2137   LEUKOCYTESUR LARGE (A) 02/14/2023 2137   Sepsis Labs: @LABRCNTIP (procalcitonin:4,lacticidven:4) ) Recent Results (from the past  240 hour(s))  Urine Culture     Status: None (Preliminary result)   Collection Time: 02/14/23  9:37 PM   Specimen: Urine, Random  Result Value Ref Range Status   Specimen Description   Final    URINE, RANDOM Performed at [facility], [address] [place], [place] [address]    Special Requests   Final    URINE, RANDOM Performed at [facility], [address] [place], [place] [address]    Culture PENDING  Incomplete   Report Status PENDING  Incomplete  MRSA Next Gen by PCR, Nasal     Status: None   Collection Time: 02/15/23  8:51 PM   Specimen: Nasal Mucosa; Nasal Swab  Result Value Ref Range Status   MRSA by PCR Next Gen NOT DETECTED NOT DETECTED Final    Comment: (NOTE) The GeneXpert MRSA Assay (FDA approved for NASAL specimens only), is one component of a comprehensive MRSA colonization surveillance program. It is  not intended to diagnose MRSA infection nor to guide or monitor treatment for MRSA infections. Test performance is not FDA approved in patients less than 38 years old. Performed at [facility], [address], [place], [place] [address]      Scheduled Meds:  aspirin  81 mg Oral Daily   atorvastatin  40 mg Oral Daily   Chlorhexidine Gluconate Cloth  6 each Topical Daily   donepezil  10 mg Oral QHS   fesoterodine  8 mg Oral Daily   heparin  5,000 Units Subcutaneous Q8H   insulin aspart  0-9 Units Subcutaneous TID WC   levothyroxine  25 mcg Oral Q0600   mirabegron ER  50 mg Oral Daily   sertraline  100 mg Oral QHS   Continuous Infusions:  cefTRIAXone (ROCEPHIN)  IV Stopped (02/16/23 0004)   levETIRAcetam      Procedures/Studies: MR ANGIO HEAD WO CONTRAST  Result Date: 02/15/2023 CLINICAL DATA:  Follow-up examination for stroke. EXAM: MRA NECK WITHOUT CONTRAST MRA HEAD WITHOUT CONTRAST TECHNIQUE: Angiographic images of the Circle of Willis were acquired using MRA technique without intravenous contrast. COMPARISON:  None Available. FINDINGS: MRA NECK  FINDINGS Aortic arch: Examination technically limited by motion and lack of IV contrast. Visualized aortic arch normal caliber. Bovine branching pattern noted at the aortic arch. No visible stenosis about the origin the great vessels. Right carotid system: Right common and internal carotid arteries are patent with antegrade flow. No evidence for dissection. Atheromatous irregularity about the right carotid bulb without hemodynamically significant greater than 50% stenosis. Left carotid system: Left common and internal carotid arteries are tortuous but patent with antegrade flow. No evidence for dissection. Atheromatous irregularity about the left carotid artery system, but without convincing hemodynamically significant greater than 50% stenosis. Vertebral arteries: Right vertebral artery arises from the right subclavian artery. Origin of the left vertebral artery not well assessed on this limited exam. Right vertebral artery dominant. Vertebral arteries are patent with antegrade flow. No evidence for dissection. Mild atheromatous irregularity without visible hemodynamically significant stenosis. Other: None. MRA HEAD FINDINGS Anterior circulation: Visualized internal carotid arteries are patent through the siphons without stenosis. A1 segments patent bilaterally. Left A1 hypoplastic, accounting for the diminutive left ICA is compared to the right. Evaluation of the anterior communicating artery complex limited by motion. No obvious abnormality. Anterior cerebral arteries patent without visible stenosis. Focal severe proximal right M1 stenosis (series [id number], image 9). Atheromatous irregularity seen distally within the right MCA, within additional focal moderate to severe proximal right M2 stenosis (series 29562, image 12). Right MCA branches are patent distally. Left M1 segment and distal left MCA branches widely patent and well perfused. Posterior circulation: Neither intradural V4 segment visualized on the images  provided. Visualized mid in distal vertebral artery patent with antegrade flow. Superior cerebral arteries patent bilaterally. Left PCA supplied via the basilar. Fetal type origin of the right PCA. Probable severe proximal right PCA stenosis (series 13086, image 1). PCAs otherwise right PCA attenuated but grossly patent distally on time-of-flight sequence. Left PCA patent without visible stenosis. Anatomic variants: As above. Other: No intracranial aneurysm. 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 IMPRESSION: MRA HEAD IMPRESSION: 1. Negative intracranial MRA for large vessel occlusion. 2. 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 3. Otherwise wide patency of the major intracranial arterial circulation. MRA NECK IMPRESSION: 1. Atheromatous irregularity about the carotid bifurcations without  hemodynamically significant greater than 50% stenosis. 2. Patent antegrade flow within both vertebral arteries within the neck. Right vertebral artery dominant. 3. Tortuosity of the major arterial vasculature of the neck, suggesting chronic underlying hypertension. Electronically Signed   By: Rise Mu M.D.   On: 02/15/2023 17:52   MR ANGIO NECK WO CONTRAST  Result Date: 02/15/2023 CLINICAL DATA:  Follow-up examination for stroke. EXAM: MRA NECK WITHOUT CONTRAST MRA HEAD WITHOUT CONTRAST TECHNIQUE: Angiographic images of the Circle of Willis were acquired using MRA technique without intravenous contrast. COMPARISON:  None Available. FINDINGS: MRA NECK FINDINGS Aortic arch: Examination technically limited by motion and lack of IV contrast. Visualized aortic arch normal caliber. Bovine branching pattern noted at the aortic arch. No visible stenosis about the origin the great vessels. Right carotid system: Right common and internal carotid arteries are  patent with antegrade flow. No evidence for dissection. Atheromatous irregularity about the right carotid bulb without hemodynamically significant greater than 50% stenosis. Left carotid system: Left common and internal carotid arteries are tortuous but patent with antegrade flow. No evidence for dissection. Atheromatous irregularity about the left carotid artery system, but without convincing hemodynamically significant greater than 50% stenosis. Vertebral arteries: Right vertebral artery arises from the right subclavian artery. Origin of the left vertebral artery not well assessed on this limited exam. Right vertebral artery dominant. Vertebral arteries are patent with antegrade flow. No evidence for dissection. Mild atheromatous irregularity without visible hemodynamically significant stenosis. Other: None. MRA HEAD FINDINGS Anterior circulation: Visualized internal carotid arteries are patent through the siphons without stenosis. A1 segments patent bilaterally. Left A1 hypoplastic, accounting for the diminutive left ICA is compared to the right. Evaluation of the anterior communicating artery complex limited by motion. No obvious abnormality. Anterior cerebral arteries patent without visible stenosis. Focal severe proximal right M1 stenosis (series 16109, image 9). Atheromatous irregularity seen distally within the right MCA, within additional focal moderate to severe proximal right M2 stenosis (series 60454, image 12). Right MCA branches are patent distally. Left M1 segment and distal left MCA branches widely patent and well perfused. Posterior circulation: Neither intradural V4 segment visualized on the images provided. Visualized mid in distal vertebral artery patent with antegrade flow. Superior cerebral arteries patent bilaterally. Left PCA supplied via the basilar. Fetal type origin of the right PCA. Probable severe proximal right PCA stenosis (series 09811, image 1). PCAs otherwise right PCA attenuated  but grossly patent distally on time-of-flight sequence. Left PCA patent without visible stenosis. Anatomic variants: As above. Other: No intracranial aneurysm. 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 IMPRESSION: MRA HEAD IMPRESSION: 1. Negative intracranial MRA for large vessel occlusion. 2. 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 3. Otherwise wide patency of the major intracranial arterial circulation. MRA NECK IMPRESSION: 1. Atheromatous irregularity about the carotid bifurcations without hemodynamically significant greater than 50% stenosis. 2. Patent antegrade flow within both vertebral arteries within the neck. Right vertebral artery dominant. 3. Tortuosity of the major arterial vasculature of the neck, suggesting chronic underlying hypertension. Electronically Signed   By: Rise Mu M.D.   On: 02/15/2023 17:52   ECHOCARDIOGRAM COMPLETE  Result Date: 02/15/2023    ECHOCARDIOGRAM REPORT   Patient Name:   Leah Payne Date of Exam: 02/15/2023 Medical Rec #:  [id number]         Height:       62.0 in Accession #:  [id number]        Weight:       129.0 lb Date of Birth:  [birth year]         BSA:          1.587 m Patient Age:    59 years          BP:           171/96 mmHg Patient Gender: F                 HR:           85 bpm. [facility] Procedure: 2D Echo, Cardiac Doppler and Color Doppler Indications:    R06.9 DOE  History:        Patient has no prior history of Echocardiogram examinations.                 Signs/Symptoms:Dyspnea; Risk Factors:Hypertension and                 Dyslipidemia.  Sonographer:    Dondra Prader RVT RCS Referring Phys: [id number] Ahlivia Salahuddin  Sonographer Comments: Technically challenging study due to limited acoustic windows, Technically difficult study due to poor echo windows and suboptimal  parasternal window. IMPRESSIONS  1. Left ventricular ejection fraction, by estimation, is 55 to 60%. The left ventricle has normal function. Left ventricular endocardial border not optimally defined to evaluate regional wall motion. Left ventricular diastolic parameters are consistent with Grade I diastolic dysfunction (impaired relaxation).  2. Right ventricular systolic function is normal. The right ventricular size is normal.  3. The mitral valve was not well visualized. No evidence of mitral valve regurgitation. No evidence of mitral stenosis.  4. The aortic valve was not well visualized. Aortic valve regurgitation is not visualized. No aortic stenosis is present.  5. The inferior vena cava is normal in size with greater than 50% respiratory variability, suggesting right atrial pressure of 3 mmHg. FINDINGS  Left Ventricle: Left ventricular ejection fraction, by estimation, is 55 to 60%. The left ventricle has normal function. Left ventricular endocardial border not optimally defined to evaluate regional wall motion. The left ventricular internal cavity size was normal in size. There is no left ventricular hypertrophy. Left ventricular diastolic parameters are consistent with Grade I diastolic dysfunction (impaired relaxation). Normal left ventricular filling pressure. Right Ventricle: The right ventricular size is normal. Right vetricular wall thickness was not well visualized. Right ventricular systolic function is normal. Left Atrium: Left atrial size was normal in size. Right Atrium: Right atrial size was normal in size. Pericardium: There is no evidence of pericardial effusion. Mitral Valve: The mitral valve was not well visualized. There is mild thickening of the mitral valve leaflet(s). There is mild calcification of the mitral valve leaflet(s). Mild mitral annular calcification. No evidence of mitral valve regurgitation. No evidence of mitral valve stenosis. Tricuspid Valve: The tricuspid valve is normal  in structure. Tricuspid valve regurgitation is not demonstrated. No evidence of tricuspid stenosis. Aortic Valve: The aortic valve was not well visualized. Aortic valve regurgitation is not visualized. No aortic stenosis is present. Aortic valve mean gradient measures 4.0 mmHg. Aortic valve peak gradient measures 8.3 mmHg. Aortic valve area, by VTI measures 2.05 cm. Pulmonic Valve: The pulmonic valve was not well visualized. Pulmonic valve regurgitation is not visualized. No evidence of pulmonic stenosis. Aorta: The aortic root is normal in size and structure. Venous: The inferior vena cava is normal in size with greater than 50% respiratory variability,  suggesting right atrial pressure of 3 mmHg. IAS/Shunts: No atrial level shunt detected by color flow Doppler.  LEFT VENTRICLE PLAX 2D LVIDd:         3.50 cm   Diastology LVIDs:         2.60 cm   LV e' medial:    5.22 cm/s LV PW:         0.70 cm   LV E/e' medial:  14.5 LV IVS:        0.80 cm   LV e' lateral:   8.16 cm/s LVOT diam:     2.00 cm   LV E/e' lateral: 9.3 LV SV:         54 LV SV Index:   34 LVOT Area:     3.14 cm  RIGHT VENTRICLE             IVC RV Basal diam:  2.50 cm     IVC diam: 1.70 cm RV S prime:     13.10 cm/s TAPSE (M-mode): 1.8 cm LEFT ATRIUM             Index        RIGHT ATRIUM          Index LA diam:        3.20 cm 2.02 cm/m   RA Area:     9.66 cm LA Vol (A2C):   34.7 ml 21.87 ml/m  RA Volume:   18.60 ml 11.72 ml/m LA Vol (A4C):   32.0 ml 20.17 ml/m LA Biplane Vol: 35.5 ml 22.37 ml/m  AORTIC VALVE                    PULMONIC VALVE AV Area (Vmax):    2.06 cm     PV Vmax:       0.92 m/s AV Area (Vmean):   2.08 cm     PV Peak grad:  3.3 mmHg AV Area (VTI):     2.05 cm AV Vmax:           144.00 cm/s AV Vmean:          93.100 cm/s AV VTI:            0.262 m AV Peak Grad:      8.3 mmHg AV Mean Grad:      4.0 mmHg LVOT Vmax:         94.60 cm/s LVOT Vmean:        61.600 cm/s LVOT VTI:          0.171 m LVOT/AV VTI ratio: 0.65  AORTA Ao Root  diam: 2.80 cm MITRAL VALVE MV Area (PHT): 3.81 cm    SHUNTS MV Decel Time: 199 msec    Systemic VTI:  0.17 m MV E velocity: 75.95 cm/s  Systemic Diam: 2.00 cm MV A velocity: 94.60 cm/s MV E/A ratio:  0.80 Dina Rich MD Electronically signed by Dina Rich MD Signature Date/Time: 02/15/2023/11:22:04 AM    Final    MR BRAIN WO CONTRAST  Result Date: 02/15/2023 CLINICAL DATA:  Mental status change, unknown cause. Dizziness, gait instability, chronic hydrocephalus. EXAM: MRI HEAD WITHOUT CONTRAST TECHNIQUE: Multiplanar, multiecho pulse sequences of the brain and surrounding structures were obtained without intravenous contrast. COMPARISON:  MRI brain 09/20/2017.  Head CT 02/14/2023. FINDINGS: Brain: Acute infarct in the right middle frontal gyrus. No acute hemorrhage or significant mass effect. Background of moderate chronic small-vessel disease, similar to prior. Stable severe dilation of the third and lateral  ventricles, consistent with aqueductal stenosis. Vascular: Normal flow voids. Skull and upper cervical spine: Normal marrow signal. Sinuses/Orbits: Unremarkable. Other: None. IMPRESSION: 1. Acute infarct in the right middle frontal gyrus. No acute hemorrhage or significant mass effect. 2. Stable severe dilation of the third and lateral ventricles, consistent with aqueductal stenosis. Electronically Signed   By: Orvan Falconer M.D.   On: 02/15/2023 10:04   CT Head Wo Contrast  Result Date: 02/14/2023 CLINICAL DATA:  Vertigo, central. EXAM: CT HEAD WITHOUT CONTRAST TECHNIQUE: Contiguous axial images were obtained from the base of the skull through the vertex without intravenous contrast. RADIATION DOSE REDUCTION: This exam was performed according to the departmental dose-optimization program which includes automated exposure control, adjustment of the mA and/or kV according to patient size and/or use of iterative reconstruction technique. COMPARISON:  Head CT 02/22/2018. FINDINGS: Brain: No acute  hemorrhage. 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 No extra-axial collection. Vascular: No hyperdense vessel or unexpected calcification. Skull: No calvarial fracture or suspicious bone lesion. Skull base is unremarkable. Sinuses/Orbits: No acute finding. Other: None. IMPRESSION: 1. No acute intracranial abnormality. 2. 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 Electronically Signed   By: Orvan Falconer M.D.   On: 02/14/2023 16:11   DG Chest Port 1 View  Result Date: 01/24/2023 CLINICAL DATA:  Chronic cough. EXAM: PORTABLE CHEST 1 VIEW COMPARISON:  11/15/2020 FINDINGS: Lung apices obscured by the patient's face. The lungs are clear without focal pneumonia, edema, pneumothorax or pleural effusion. Cardiopericardial silhouette is at upper limits of normal for size. No acute bony abnormality. Telemetry leads overlie the chest. IMPRESSION: No active disease. Electronically Signed   By: Kennith Center M.D.   On: 01/24/2023 12:23    Catarina Hartshorn, DO  [facility]  If 7PM-7AM, please contact night-coverage [email or web address] Password [facility] 02/16/2023, 8:36 AM   LOS: 0 days

## 2023-02-16 NOTE — Procedures (Addendum)
Patient Name: Leah Payne  MRN: [id number]  Epilepsy Attending: Charlsie Quest  Referring Physician/Provider: Catarina Hartshorn, MD  Date: 02/16/2023 Duration: 28.32 mins  Patient history: 77 year old female with history of seizures. Overnight patient had 3 generalized tonic-clonic seizures, not back to baseline. EEG to evaluate for seizure  Level of alertness: Awake  AEDs during EEG study: LEV  Technical aspects: This EEG study was done with scalp electrodes positioned according to the 10-20 International system of electrode placement. Electrical activity was reviewed with band pass filter of 1-70Hz , sensitivity of 7 uV/mm, display speed of 52mm/sec with a 60Hz  notched filter applied as appropriate. EEG data were recorded continuously and digitally stored.  Video monitoring was available and reviewed as appropriate.  Description: The posterior dominant rhythm consists of 7.5 Hz activity of moderate voltage (25-35 uV) seen predominantly in posterior head regions, symmetric and reactive to eye opening and eye closing. EEG showed continuous 2-3hz  delta slowing admixed with 13-15hz  beta activity in bifrontal region. Sharp transients were noted in left hemisphere, maximal left temporal region, qasi- periodic at 0.5-1Hz . These discharges could be epileptiform but difficult to be certain due to significant electrode and movement artifact. Hyperventilation and photic stimulation were not performed.   Of note, study was technically difficult due to significant movement and electrode artifact.     ABNORMALITY - Continuous slow, bifrontal region  IMPRESSION: This technically difficult study is suggestive of cortical dysfunction arising from bifrontal region likely secondary to underlying structural abnormality. No seizures were seen throughout the recording.  Sharp transients were noted in left hemisphere, maximal left temporal region. These discharges could be epileptiform but difficult to be certain  due to significant electrode and movement artifact. Recommend repeating EEG if concern for interictal activity persists.  [name]

## 2023-02-16 NOTE — TOC Progression Note (Addendum)
Transition of Care [facility]) - Progression Note    Patient Details  Name: Leah Payne MRN: [id number] Date of Birth: 10/24/1945  Transition of Care [facility]) CM/SW Contact  Leitha Bleak, RN Phone Number: 02/16/2023, 10:30 AM  Clinical Narrative:   Herbert Seta from [facility] accepted. Added bed offer to Auth. DC possibly Friday.    Addendum : approved 7/25 - 7/29, next review date 7/29, Vesta Mixer id [id number], plan auth id [id number]    Expected Discharge Plan: Skilled Nursing Facility Barriers to Discharge: Insurance Authorization  Expected Discharge Plan and Services       Living arrangements for the past 2 months: Assisted Living Facility

## 2023-02-16 NOTE — Progress Notes (Signed)
I connected with  [name] on 02/15/23 by a video enabled telemedicine application and verified that I am speaking with the correct person using two identifiers.   I discussed the limitations of evaluation and management by telemedicine. The patient was drowsy and unable to express understanding.   Location of patient: [facility] Location of physician: [facility]   Subjective: Reported he had 3 episodes of generalized tonic-clonic seizures overnight lasting 1 to 2 minutes.  Was given IV Ativan as well as Keppra 1000 mg.  Drowsy this morning  ROS: Unable to obtain due to poor mental status  Examination  Vital signs in last 24 hours: Temp:  [97.4 F (36.3 C)-99.3 F (37.4 C)] 99.1 F (37.3 C) (07/25 0809) Pulse Rate:  [82-112] 86 (07/25 0700) Resp:  [0-22] 19 (07/25 0700) BP: (134-196)/(58-114) 135/58 (07/25 0700) SpO2:  [98 %-100 %] 100 % (07/25 0700) Weight:  [51.5 kg] 51.5 kg (07/25 0432)  General: lying in bed, NAD Neuro: Barely opens eyes but states yes and able to tell me her name, did not follow commands, on passive eye opening, appeared to have left gaze preference but by RN did have intact extraocular movements.  Spontaneously moving all 4 extremities in bed without any stiffness or jerking  Basic Metabolic Panel: Recent Labs  Lab 02/14/23 1531 02/15/23 0640 02/15/23 2041 02/16/23 0532  NA 133* 138 135 133*  K 3.1* 3.2* 2.7* 4.0  CL 96* 104 98 102  CO2 26 26 24 22   GLUCOSE 117* 85 98 98  BUN 10 7* 7* 7*  CREATININE 0.59 0.45 0.58 0.56  CALCIUM 8.7* 8.3* 8.0* 7.9*  MG  --  1.5* 1.9 2.0    CBC: Recent Labs  Lab 02/14/23 1531 02/15/23 0423 02/15/23 2041  WBC 8.2 5.9 5.5  NEUTROABS  --  3.5  --   HGB 12.6 11.4* 12.8  HCT 38.0 34.2* 39.3  MCV 88.0 88.4 88.7  PLT 263 251 288     Coagulation Studies: No results for input(s): "LABPROT", "INR" in the last 72 hours.  Imaging MRA head and neck without contrast 02/15/2023:  1. Negative  intracranial MRA for large vessel occlusion. 2. 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 3. Otherwise wide patency of the major intracranial arterial circulation.   MRA NECK IMPRESSION:   1. Atheromatous irregularity about the carotid bifurcations without hemodynamically significant greater than 50% stenosis. 2. Patent antegrade flow within both vertebral arteries within the neck. Right vertebral artery dominant. 3. Tortuosity of the major arterial vasculature of the neck, suggesting chronic underlying hypertension.  ASSESSMENT AND PLAN: 77 year old female with history of seizures presented with dizziness, was diagnosed with UTI.  Neurology was initially consulted because MRI brain showed incidental right frontal infarct.  However overnight patient had 3 generalized tonic-clonic seizures.  Therefore neurology was reconsulted today  Epilepsy with breakthrough seizure -Likely due to lowered seizure threshold in the setting of E. coli UTI -Recommend increasing Keppra to 750 mg twice daily -Will get EEG as patient continues to be altered and had left gaze preference -If mental status does not improve or EEG is abnormal, may need transfer to [facility] for long-term EEG -Continue seizure precautions -Plan discussed with Dr. Arbutus Leas via secure chat  Acute ischemic stroke, incidental -Recommend aspirin 81 mg daily and Plavix 70 mg daily for 3 months followed by aspirin 81 mg daily due to intracranial stenosis -Rest  of the recommendations per yesterday's note  B12 deficiency -Recommend p.o. supplementation Folate deficiency Recommend p.o. supplementation  E. coli UTI -On ceftriaxone, management per primary team  Thank you for allowing [place] to participate in the care of this patient. If you have any further questions, please contact me or neurohospitalist.   I have spent a total  of  36  minutes with the patient reviewing hospital notes,  test results, labs and examining the patient as well as establishing an assessment and plan.   > 50% of time was spent in direct patient care.     Lindie Spruce Epilepsy [facility] For questions after 5pm please refer to AMION to reach the Neurologist on call

## 2023-02-16 NOTE — Progress Notes (Signed)
EEG complete - results pending 

## 2023-02-16 NOTE — Progress Notes (Signed)
PT Cancellation Note  Patient Details Name: Leah QUIZHPI MRN: [id number] DOB: [birth year]   Cancelled Treatment:    Reason Eval/Treat Not Completed: Medical issues which prohibited therapy.  Patient transferred to a higher level of care and will need new PT consult to resume therapy when patient is medically stable.  Thank you.    7:40 AM, 02/16/23 [name], MPT Physical Therapist with [phone] office [phone] mobile phone

## 2023-02-16 NOTE — Plan of Care (Signed)
Problem: Education: Goal: Knowledge of General Education information will improve Description: Including pain rating scale, medication(s)/side effects and non-pharmacologic comfort measures 02/16/2023 0425 by Aretta Nip, RN Outcome: Not Progressing 02/16/2023 0424 by Aretta Nip, RN Outcome: Not Progressing   Problem: Health Behavior/Discharge Planning: Goal: Ability to manage health-related needs will improve 02/16/2023 0425 by Aretta Nip, RN Outcome: Not Progressing 02/16/2023 0424 by Aretta Nip, RN Outcome: Not Progressing   Problem: Clinical Measurements: Goal: Ability to maintain clinical measurements within normal limits will improve 02/16/2023 0425 by Aretta Nip, RN Outcome: Not Progressing 02/16/2023 0424 by Aretta Nip, RN Outcome: Not Progressing Goal: Will remain free from infection 02/16/2023 0425 by Aretta Nip, RN Outcome: Not Progressing 02/16/2023 0424 by Aretta Nip, RN Outcome: Not Progressing Goal: Diagnostic test results will improve 02/16/2023 0425 by Aretta Nip, RN Outcome: Not Progressing 02/16/2023 0424 by Aretta Nip, RN Outcome: Not Progressing Goal: Respiratory complications will improve 02/16/2023 0425 by Aretta Nip, RN Outcome: Not Progressing 02/16/2023 0424 by Aretta Nip, RN Outcome: Not Progressing Goal: Cardiovascular complication will be avoided 02/16/2023 0425 by Aretta Nip, RN Outcome: Not Progressing 02/16/2023 0424 by Aretta Nip, RN Outcome: Not Progressing   Problem: Activity: Goal: Risk for activity intolerance will decrease 02/16/2023 0425 by Aretta Nip, RN Outcome: Not Progressing 02/16/2023 0424 by Aretta Nip, RN Outcome: Not Progressing   Problem: Nutrition: Goal: Adequate nutrition will be maintained 02/16/2023 0425 by Aretta Nip, RN Outcome: Not Progressing 02/16/2023 0424 by Aretta Nip, RN Outcome: Not Progressing   Problem: Coping: Goal: Level  of anxiety will decrease 02/16/2023 0425 by Aretta Nip, RN Outcome: Not Progressing 02/16/2023 0424 by Aretta Nip, RN Outcome: Not Progressing   Problem: Elimination: Goal: Will not experience complications related to bowel motility 02/16/2023 0425 by Aretta Nip, RN Outcome: Not Progressing 02/16/2023 0424 by Aretta Nip, RN Outcome: Not Progressing Goal: Will not experience complications related to urinary retention 02/16/2023 0425 by Aretta Nip, RN Outcome: Not Progressing 02/16/2023 0424 by Aretta Nip, RN Outcome: Not Progressing   Problem: Pain Managment: Goal: General experience of comfort will improve 02/16/2023 0425 by Aretta Nip, RN Outcome: Not Progressing 02/16/2023 0424 by Aretta Nip, RN Outcome: Not Progressing   Problem: Safety: Goal: Ability to remain free from injury will improve 02/16/2023 0425 by Aretta Nip, RN Outcome: Not Progressing 02/16/2023 0424 by Aretta Nip, RN Outcome: Not Progressing   Problem: Skin Integrity: Goal: Risk for impaired skin integrity will decrease 02/16/2023 0425 by Aretta Nip, RN Outcome: Not Progressing 02/16/2023 0424 by Aretta Nip, RN Outcome: Not Progressing   Problem: Education: Goal: Ability to describe self-care measures that may prevent or decrease complications (Diabetes Survival Skills Education) will improve 02/16/2023 0425 by Aretta Nip, RN Outcome: Not Progressing 02/16/2023 0424 by Aretta Nip, RN Outcome: Not Progressing Goal: Individualized Educational Video(s) 02/16/2023 0425 by Aretta Nip, RN Outcome: Not Progressing 02/16/2023 0424 by Aretta Nip, RN Outcome: Not Progressing   Problem: Coping: Goal: Ability to adjust to condition or change in health will improve 02/16/2023 0425 by Aretta Nip, RN Outcome: Not Progressing 02/16/2023 0424 by Aretta Nip, RN Outcome: Not Progressing   Problem: Fluid Volume: Goal: Ability to maintain a  balanced intake and output will improve 02/16/2023 0425 by Aretta Nip, RN Outcome: Not Progressing 02/16/2023 0424 by Julienne Kass  R, RN Outcome: Not Progressing   Problem: Metabolic: Goal: Ability to maintain appropriate glucose levels will improve 02/16/2023 0425 by Aretta Nip, RN Outcome: Not Progressing 02/16/2023 0424 by Aretta Nip, RN Outcome: Not Progressing   Problem: Nutritional: Goal: Maintenance of adequate nutrition will improve 02/16/2023 0425 by Aretta Nip, RN Outcome: Not Progressing 02/16/2023 0424 by Aretta Nip, RN Outcome: Not Progressing Goal: Progress toward achieving an optimal weight will improve 02/16/2023 0425 by Aretta Nip, RN Outcome: Not Progressing 02/16/2023 0424 by Aretta Nip, RN Outcome: Not Progressing   Problem: Skin Integrity: Goal: Risk for impaired skin integrity will decrease 02/16/2023 0425 by Aretta Nip, RN Outcome: Not Progressing 02/16/2023 0424 by Aretta Nip, RN Outcome: Not Progressing   Problem: Tissue Perfusion: Goal: Adequacy of tissue perfusion will improve 02/16/2023 0425 by Aretta Nip, RN Outcome: Not Progressing 02/16/2023 0424 by Aretta Nip, RN Outcome: Not Progressing   Problem: Education: Goal: Expressions of having a comfortable level of knowledge regarding the disease process will increase Outcome: Not Progressing   Problem: Coping: Goal: Ability to adjust to condition or change in health will improve Outcome: Not Progressing Goal: Ability to identify appropriate support needs will improve Outcome: Not Progressing   Problem: Health Behavior/Discharge Planning: Goal: Compliance with prescribed medication regimen will improve Outcome: Not Progressing   Problem: Medication: Goal: Risk for medication side effects will decrease Outcome: Not Progressing   Problem: Clinical Measurements: Goal: Complications related to the disease process, condition or treatment will be  avoided or minimized Outcome: Not Progressing Goal: Diagnostic test results will improve Outcome: Not Progressing   Problem: Safety: Goal: Verbalization of understanding the information provided will improve Outcome: Not Progressing   Problem: Self-Concept: Goal: Level of anxiety will decrease Outcome: Not Progressing Goal: Ability to verbalize feelings about condition will improve Outcome: Not Progressing

## 2023-02-17 DIAGNOSIS — R531 Weakness: Secondary | ICD-10-CM | POA: Diagnosis not present

## 2023-02-17 DIAGNOSIS — I639 Cerebral infarction, unspecified: Secondary | ICD-10-CM | POA: Diagnosis not present

## 2023-02-17 DIAGNOSIS — N3000 Acute cystitis without hematuria: Secondary | ICD-10-CM | POA: Diagnosis not present

## 2023-02-17 DIAGNOSIS — G40909 Epilepsy, unspecified, not intractable, without status epilepticus: Secondary | ICD-10-CM | POA: Diagnosis not present

## 2023-02-17 LAB — GLUCOSE, CAPILLARY
Glucose-Capillary: 72 mg/dL (ref 70–99)
Glucose-Capillary: 88 mg/dL (ref 70–99)
Glucose-Capillary: 70 mg/dL (ref 70–99)

## 2023-02-17 MED ORDER — AMOXICILLIN 500 MG PO CAPS
500.0000 mg | ORAL_CAPSULE | Freq: Three times a day (TID) | ORAL | Status: AC
  Administered 2023-02-17 – 2023-02-18 (×4): 500 mg via ORAL
  Filled 2023-02-17 (×4): qty 1

## 2023-02-17 MED ORDER — SODIUM CHLORIDE 0.9 % IV BOLUS
500.0000 mL | Freq: Once | INTRAVENOUS | Status: AC
  Administered 2023-02-17: 500 mL via INTRAVENOUS

## 2023-02-17 NOTE — Progress Notes (Signed)
[facility] called for transport of patient.

## 2023-02-17 NOTE — Care Plan (Signed)
Discussed case with Dr. Arbutus Leas.  Patient still not back to baseline, intermittently following commands but continues to have left gaze preference.  EEG yesterday showed rare sharp transients in the left hemisphere but could not be certain if those were artifactual.  However, with her clinical presentation, patient would benefit from long-term EEG.  Recommend transfer to [facility] for long-term EEG.  Also recommend 2 mg IV Ativan in case the left gaze preference is due to focal seizure.  Please let neurology team know once patient arrives at [facility].  Lorrin Jackson Annabelle Harman

## 2023-02-17 NOTE — Plan of Care (Signed)
  Problem: Education: Goal: Knowledge of General Education information will improve Description: Including pain rating scale, medication(s)/side effects and non-pharmacologic comfort measures Outcome: Progressing   Problem: Health Behavior/Discharge Planning: Goal: Ability to manage health-related needs will improve Outcome: Progressing   Problem: Clinical Measurements: Goal: Ability to maintain clinical measurements within normal limits will improve Outcome: Progressing Goal: Will remain free from infection Outcome: Progressing Goal: Diagnostic test results will improve Outcome: Progressing Goal: Respiratory complications will improve Outcome: Progressing Goal: Cardiovascular complication will be avoided Outcome: Progressing   Problem: Activity: Goal: Risk for activity intolerance will decrease Outcome: Progressing   Problem: Nutrition: Goal: Adequate nutrition will be maintained Outcome: Progressing   Problem: Coping: Goal: Level of anxiety will decrease Outcome: Progressing   Problem: Elimination: Goal: Will not experience complications related to bowel motility Outcome: Progressing Goal: Will not experience complications related to urinary retention Outcome: Progressing   Problem: Pain Managment: Goal: General experience of comfort will improve Outcome: Progressing   Problem: Safety: Goal: Ability to remain free from injury will improve Outcome: Progressing   Problem: Skin Integrity: Goal: Risk for impaired skin integrity will decrease Outcome: Progressing   Problem: Education: Goal: Ability to describe self-care measures that may prevent or decrease complications (Diabetes Survival Skills Education) will improve Outcome: Progressing Goal: Individualized Educational Video(s) Outcome: Progressing   Problem: Coping: Goal: Ability to adjust to condition or change in health will improve Outcome: Progressing   Problem: Fluid Volume: Goal: Ability to  maintain a balanced intake and output will improve Outcome: Progressing   Problem: Health Behavior/Discharge Planning: Goal: Ability to identify and utilize available resources and services will improve Outcome: Progressing Goal: Ability to manage health-related needs will improve Outcome: Progressing   Problem: Metabolic: Goal: Ability to maintain appropriate glucose levels will improve Outcome: Progressing   Problem: Nutritional: Goal: Maintenance of adequate nutrition will improve Outcome: Progressing Goal: Progress toward achieving an optimal weight will improve Outcome: Progressing   Problem: Skin Integrity: Goal: Risk for impaired skin integrity will decrease Outcome: Progressing   Problem: Tissue Perfusion: Goal: Adequacy of tissue perfusion will improve Outcome: Progressing   Problem: Education: Goal: Expressions of having a comfortable level of knowledge regarding the disease process will increase Outcome: Progressing   Problem: Coping: Goal: Ability to adjust to condition or change in health will improve Outcome: Progressing Goal: Ability to identify appropriate support needs will improve Outcome: Progressing   Problem: Health Behavior/Discharge Planning: Goal: Compliance with prescribed medication regimen will improve Outcome: Progressing   Problem: Medication: Goal: Risk for medication side effects will decrease Outcome: Progressing   Problem: Clinical Measurements: Goal: Complications related to the disease process, condition or treatment will be avoided or minimized Outcome: Progressing Goal: Diagnostic test results will improve Outcome: Progressing   Problem: Safety: Goal: Verbalization of understanding the information provided will improve Outcome: Progressing   Problem: Self-Concept: Goal: Level of anxiety will decrease Outcome: Progressing Goal: Ability to verbalize feelings about condition will improve Outcome: Progressing

## 2023-02-17 NOTE — Progress Notes (Signed)
[facility] is now transporting patient to [facility].  Report has been called to receiving nurse.

## 2023-02-17 NOTE — Progress Notes (Addendum)
PROGRESS NOTE  Leah Payne YQI:[id number] DOB: 01/02/1946 DOA: 02/14/2023 PCP: Tommie Sams, DO  Brief History:  77 year old female with a history of dementia, diabetes mellitus type 2, hyperlipidemia, seizure disorder, hydrocephalus, hypothyroidism, WPW presenting with dizziness and gait instability.  The patient resides at [facility].  She states that at baseline she was able to ambulate without any assistance.  However in the past week she has noted some dizziness and unsteady gait.  Unclear if she has been started on any new medicines.  She has a difficult historian.  However, she denies any fevers, chills, visual disturbance or changes, neck pain, chest pain, shortness of breath, nausea, vomiting, diarrhea, abdominal pain.  She has a bifrontal headache which she says has been on and off.  She denies any dysuria or hematuria. In the ED, the patient was afebrile and hemodynamically stable with oxygen saturation 100% room air.  WBC 5.9, hemoglobin 11.4, platelets 251,000.  Sodium 133, potassium 5.1, bicarbonate 26, serum creatinine 0.59.  EKG shows sinus rhythm with nonspecific ST changes.  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  The patient was admitted for further evaluation and treatment of her dizziness and unsteady gait.  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  There was persistent severe dilatation of the third and lateral ventricles.  MRA of the brain was negative for LVO.  There is no hemodynamic significant stenosis of the carotids. She was seen by neurology who recommended aspirin 81 mg daily and atorvastatin.  In the evening of 02/15/2023, the patient had an initial seizure lasting 1.5 minutes.  About 10 minutes later, she had second seizure lasting 1 minute.  Prior to receiving her Keppra load, the patient had a  third seizure after which the patient received Ativan.  There were no further seizures after Keppra load of 1 gram.  EEG on 02/16/23 showed rare sharp transients in the left hemisphere but could not be certain if those were artifactual.  -discussed with neurology, Dr. Marletta Lor remains confused, not back to baseline with continued left gaze preference.  Concerned about continued focal seziure>>transfer to [facility] for LTM   Assessment/Plan: Generalized weakness/dizziness/gait stability -Secondary to UTI -B12--167>>replete -Foic acid--4.3>>replete -TSH--1.691 -MRI brain--acute infarct in the right middle frontal gyrus.  Persistent severe dilatation of the third and lateral ventricles. 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 -MRA brain negative for LVO, negative for hemodynamically significant stenosis of the carotids. -Appreciate neurology consult>> start aspirin 81 mg daily and plavix x 3 months, then aspirin alone  -Lipitor 40 mg daily -PT evaluation>>SNF   Seizure disorder -on keppra at home -Patient had 3 seizures on the evening of 02/15/2023 -02/16/23 AM--pt awake and following commands.  Answering questions but slow to respond -appreciate neurology -increase baseline keppra to 750 mg bid -7/25 EEG-- rare sharp transients in the left hemisphere but could not be certain if those were artifactual.  -discussed with neurology, Dr. Marletta Lor remains confused, not back to baseline with continued left gaze preference.  Concerned about continued focal seziure>>transfer to [facility] for LTM   UTI--E. coli -UA > 50 WBC -continue ceftriaxone pending culture data -give amoxil x 24  hours to finish 5 days of therapy   Hypothyroidism -Continue levothyroxine   Mixed hyperlipidemia -Continue statin   Controlled diabetes mellitus type  2 -NovoLog sliding scale -Check hemoglobin A1c -- 5.6   Major neurocognitive disorder -Continue Aricept   Hypokalemia/hypomagnesemia -Repleting                 Family Communication:   no Family at bedside; left VM for multiple family members   Consultants:  none   Code Status:  DNR   DVT Prophylaxis:  [place] Heparin     Procedures: As Listed in Progress Note Above   Antibiotics: Ceftriaxone 7/23>>7/26 Amoxil 7/26>>7/27               Subjective: Patient is confused.  She speaks nonsensically.  She denies cp, sob.  Remainder ROS not obtainable.    Objective: Vitals:   02/17/23 0700 02/17/23 0800 02/17/23 0900 02/17/23 0934  BP: (!) 129/56 (!) 138/54 (!) 157/92   Pulse: 90  (!) 105 97  Resp: (!) 22 (!) 24 (!) 28 (!) 25  Temp:  (!) 97.4 F (36.3 C)    TempSrc:  Oral    SpO2: 94%  (!) 83% 95%  Weight:      Height:        Intake/Output Summary (Last 24 hours) at 02/17/2023 1036 Last data filed at 02/17/2023 0422 Gross per 24 hour  Intake 371 ml  Output --  Net 371 ml   Weight change: 0.7 kg Exam:  General:  Pt is alert, intermittently follows commands appropriately, not in acute distress HEENT: No icterus, No thrush, No neck mass, [place]/AT Cardiovascular: RRR, S1/S2, no rubs, no gallops Respiratory: bibasilar rales.  No wheeze Abdomen: Soft/+BS, non tender, non distended, no guarding Extremities: No edema, No lymphangitis, No petechiae, No rashes, no synovitis Neuro--+left gaze preference   Data Reviewed: I have personally reviewed following labs and imaging studies Basic Metabolic Panel: Recent Labs  Lab 02/14/23 1531 02/15/23 0640 02/15/23 2041 02/16/23 0532 02/17/23 0517  NA 133* 138 135 133* 133*  K 3.1* 3.2* 2.7* 4.0 3.6  CL 96* 104 98 102 99  CO2 26 26 24 22  20*  GLUCOSE 117* 85 98 98 75  BUN 10 7* 7* 7* 7*  CREATININE 0.59 0.45 0.58 0.56 0.57  CALCIUM 8.7* 8.3* 8.0* 7.9* 8.3*  MG  --  1.5* 1.9 2.0 1.6*   Liver Function  Tests: Recent Labs  Lab 02/14/23 1531 02/15/23 0640 02/15/23 2041  AST 18 16 24   ALT 16 14 14   ALKPHOS 77 65 72  BILITOT 0.9 0.9 1.0  PROT 7.2 6.7 7.4  ALBUMIN 3.3* 3.0* 3.3*   No results for input(s): "LIPASE", "AMYLASE" in the last 168 hours. No results for input(s): "AMMONIA" in the last 168 hours. Coagulation Profile: No results for input(s): "INR", "PROTIME" in the last 168 hours. CBC: Recent Labs  Lab 02/14/23 1531 02/15/23 0423 02/15/23 2041  WBC 8.2 5.9 5.5  NEUTROABS  --  3.5  --   HGB 12.6 11.4* 12.8  HCT 38.0 34.2* 39.3  MCV 88.0 88.4 88.7  PLT 263 251 288   Cardiac Enzymes: No results for input(s): "CKTOTAL", "CKMB", "CKMBINDEX", "TROPONINI" in the last 168 hours. BNP: Invalid input(s): "POCBNP" CBG: Recent Labs  Lab 02/16/23 0806 02/16/23 1059 02/16/23 1600 02/16/23 2059 02/17/23 0737  GLUCAP 92 99 76 84 72   HbA1C: Recent Labs    02/15/23 0423  HGBA1C 5.6   Urine analysis:  Component Value Date/Time   COLORURINE YELLOW 02/14/2023 2137   APPEARANCEUR CLOUDY (A) 02/14/2023 2137   LABSPEC 1.010 02/14/2023 2137   PHURINE 6.0 02/14/2023 2137   GLUCOSEU NEGATIVE 02/14/2023 2137   HGBUR SMALL (A) 02/14/2023 2137   BILIRUBINUR NEGATIVE 02/14/2023 2137   BILIRUBINUR neg 03/07/2018 1020   KETONESUR 5 (A) 02/14/2023 2137   PROTEINUR 30 (A) 02/14/2023 2137   UROBILINOGEN 0.2 03/07/2018 1020   UROBILINOGEN 1.0 08/29/2014 1206   NITRITE POSITIVE (A) 02/14/2023 2137   LEUKOCYTESUR LARGE (A) 02/14/2023 2137   Sepsis Labs: @LABRCNTIP (procalcitonin:4,lacticidven:4) ) Recent Results (from the past 240 hour(s))  Urine Culture     Status: Abnormal (Preliminary result)   Collection Time: 02/14/23  9:37 PM   Specimen: Urine, Random  Result Value Ref Range Status   Specimen Description   Final    URINE, RANDOM Performed at [facility], [address], [place], [place] [address]    Special Requests URINE, RANDOM  Final   Culture (A)  Final     >=100,000 COLONIES/mL ESCHERICHIA COLI CULTURE REINCUBATED FOR BETTER GROWTH Performed at [facility], [address] [place], [place] [address]    Report Status PENDING  Incomplete   Organism ID, Bacteria ESCHERICHIA COLI (A)  Final      Susceptibility   Escherichia coli - MIC*    AMPICILLIN 4 SENSITIVE Sensitive     CEFAZOLIN <=4 SENSITIVE Sensitive     CEFEPIME <=0.12 SENSITIVE Sensitive     CEFTRIAXONE <=0.25 SENSITIVE Sensitive     CIPROFLOXACIN <=0.25 SENSITIVE Sensitive     GENTAMICIN <=1 SENSITIVE Sensitive     IMIPENEM <=0.25 SENSITIVE Sensitive     NITROFURANTOIN <=16 SENSITIVE Sensitive     TRIMETH/SULFA <=20 SENSITIVE Sensitive     AMPICILLIN/SULBACTAM <=2 SENSITIVE Sensitive     PIP/TAZO <=4 SENSITIVE Sensitive     * >=100,000 COLONIES/mL ESCHERICHIA COLI  MRSA Next Gen by PCR, Nasal     Status: None   Collection Time: 02/15/23  8:51 PM   Specimen: Nasal Mucosa; Nasal Swab  Result Value Ref Range Status   MRSA by PCR Next Gen NOT DETECTED NOT DETECTED Final    Comment: (NOTE) The GeneXpert MRSA Assay (FDA approved for NASAL specimens only), is one component of a comprehensive MRSA colonization surveillance program. It is not intended to diagnose MRSA infection nor to guide or monitor treatment for MRSA infections. Test performance is not FDA approved in patients less than 53 years old. Performed at [facility], [address] [place], [place] [address]      Scheduled Meds:  aspirin  81 mg Oral Daily   atorvastatin  40 mg Oral Daily   Chlorhexidine Gluconate Cloth  6 each Topical Daily   clopidogrel  75 mg Oral Daily   donepezil  10 mg Oral QHS   fesoterodine  8 mg Oral Daily   heparin  5,000 Units Subcutaneous Q8H   insulin aspart  0-9 Units Subcutaneous TID WC   levETIRAcetam  750 mg Oral BID   levothyroxine  25 mcg Oral Q0600   mirabegron ER  50 mg Oral Daily   sertraline  100 mg Oral QHS   vitamin B-12  500 mcg Oral Daily   Continuous  Infusions:  cefTRIAXone (ROCEPHIN)  IV 1 g (02/17/23 0036)   levETIRAcetam      Procedures/Studies: EEG adult  Result Date: 03-05-2023 Charlsie Quest, MD     2023-03-05  5:05 PM Patient Name: [name]  MRN: [id number] Epilepsy Attending: Charlsie Quest Referring Physician/Provider: Catarina Hartshorn, MD Date: 02/16/2023 Duration: Patient history: 77 year old female with history of seizures. Overnight patient had 3 generalized tonic-clonic seizures, not back to baseline. EEG to evaluate for seizure Level of alertness: Awake AEDs during EEG study: LEV Technical aspects: This EEG study was done with scalp electrodes positioned according to the 10-20 International system of electrode placement. Electrical activity was reviewed with band pass filter of 1-70Hz , sensitivity of 7 uV/mm, display speed of 23mm/sec with a 60Hz  notched filter applied as appropriate. EEG data were recorded continuously and digitally stored.  Video monitoring was available and reviewed as appropriate. Description: The posterior dominant rhythm consists of 7.5 Hz activity of moderate voltage (25-35 uV) seen predominantly in posterior head regions, symmetric and reactive to eye opening and eye closing. EEG showed continuous 2-3hz  delta slowing admixed with 13-15hz  beta activity in bifrontal region. Sharp transients were noted in left hemisphere, maximal left temporal region, qasi- periodic at 0.5-1Hz . These discharges could be epileptiform but difficult to be certain due to significant electrode and movement artifact. Hyperventilation and photic stimulation were not performed. Of note, study was technically difficult due to significant movement and electrode artifact.   ABNORMALITY - Sharp waves, left hemisphere, maximal left temporal region - Continuous slow, bifrontal region IMPRESSION: This technically difficult study is suggestive of cortical dysfunction arising from bifrontal region likely secondary to underlying structural  abnormality. No seizures were seen throughout the recording. Sharp transients were noted in left hemisphere, maximal left temporal region. These discharges could be epileptiform but difficult to be certain due to significant electrode and movement artifact. Recommend repeating EEG if concern for interictal activity persists. Charlsie Quest   MR ANGIO HEAD WO CONTRAST  Result Date: 02/15/2023 CLINICAL DATA:  Follow-up examination for stroke. EXAM: MRA NECK WITHOUT CONTRAST MRA HEAD WITHOUT CONTRAST TECHNIQUE: Angiographic images of the Circle of Willis were acquired using MRA technique without intravenous contrast. COMPARISON:  None Available. FINDINGS: MRA NECK FINDINGS Aortic arch: Examination technically limited by motion and lack of IV contrast. Visualized aortic arch normal caliber. Bovine branching pattern noted at the aortic arch. No visible stenosis about the origin the great vessels. Right carotid system: Right common and internal carotid arteries are patent with antegrade flow. No evidence for dissection. Atheromatous irregularity about the right carotid bulb without hemodynamically significant greater than 50% stenosis. Left carotid system: Left common and internal carotid arteries are tortuous but patent with antegrade flow. No evidence for dissection. Atheromatous irregularity about the left carotid artery system, but without convincing hemodynamically significant greater than 50% stenosis. Vertebral arteries: Right vertebral artery arises from the right subclavian artery. Origin of the left vertebral artery not well assessed on this limited exam. Right vertebral artery dominant. Vertebral arteries are patent with antegrade flow. No evidence for dissection. Mild atheromatous irregularity without visible hemodynamically significant stenosis. Other: None. MRA HEAD FINDINGS Anterior circulation: Visualized internal carotid arteries are patent through the siphons without stenosis. A1 segments patent  bilaterally. Left A1 hypoplastic, accounting for the diminutive left ICA is compared to the right. Evaluation of the anterior communicating artery complex limited by motion. No obvious abnormality. Anterior cerebral arteries patent without visible stenosis. Focal severe proximal right M1 stenosis (series 40981, image 9). Atheromatous irregularity seen distally within the right MCA, within additional focal moderate to severe proximal right M2 stenosis (series [id number], image 12). Right MCA branches are patent distally. Left M1 segment and distal left MCA branches widely patent and well  perfused. Posterior circulation: Neither intradural V4 segment visualized on the images provided. Visualized mid in distal vertebral artery patent with antegrade flow. Superior cerebral arteries patent bilaterally. Left PCA supplied via the basilar. Fetal type origin of the right PCA. Probable severe proximal right PCA stenosis (series 16109, image 1). PCAs otherwise right PCA attenuated but grossly patent distally on time-of-flight sequence. Left PCA patent without visible stenosis. Anatomic variants: As above. Other: No intracranial aneurysm. 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 IMPRESSION: MRA HEAD IMPRESSION: 1. Negative intracranial MRA for large vessel occlusion. 2. 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 3. Otherwise wide patency of the major intracranial arterial circulation. MRA NECK IMPRESSION: 1. Atheromatous irregularity about the carotid bifurcations without hemodynamically significant greater than 50% stenosis. 2. Patent antegrade flow within both vertebral arteries within the neck. Right vertebral artery dominant. 3. Tortuosity of the major arterial vasculature of the neck, suggesting chronic underlying hypertension. Electronically Signed   By: Rise Mu M.D.   On: 02/15/2023 17:52   MR ANGIO NECK WO CONTRAST  Result Date: 02/15/2023 CLINICAL DATA:  Follow-up examination for stroke. EXAM: MRA NECK WITHOUT CONTRAST MRA HEAD WITHOUT CONTRAST TECHNIQUE: Angiographic images of the Circle of Willis were acquired using MRA technique without intravenous contrast. COMPARISON:  None Available. FINDINGS: MRA NECK FINDINGS Aortic arch: Examination technically limited by motion and lack of IV contrast. Visualized aortic arch normal caliber. Bovine branching pattern noted at the aortic arch. No visible stenosis about the origin the great vessels. Right carotid system: Right common and internal carotid arteries are patent with antegrade flow. No evidence for dissection. Atheromatous irregularity about the right carotid bulb without hemodynamically significant greater than 50% stenosis. Left carotid system: Left common and internal carotid arteries are tortuous but patent with antegrade flow. No evidence for dissection. Atheromatous irregularity about the left carotid artery system, but without convincing hemodynamically significant greater than 50% stenosis. Vertebral arteries: Right vertebral artery arises from the right subclavian artery. Origin of the left vertebral artery not well assessed on this limited exam. Right vertebral artery dominant. Vertebral arteries are patent with antegrade flow. No evidence for dissection. Mild atheromatous irregularity without visible hemodynamically significant stenosis. Other: None. MRA HEAD FINDINGS Anterior circulation: Visualized internal carotid arteries are patent through the siphons without stenosis. A1 segments patent bilaterally. Left A1 hypoplastic, accounting for the diminutive left ICA is compared to the right. Evaluation of the anterior communicating artery complex limited by motion. No obvious abnormality. Anterior cerebral arteries patent without visible stenosis. Focal severe proximal right M1 stenosis (series  10009, image 9). Atheromatous irregularity seen distally within the right MCA, within additional focal moderate to severe proximal right M2 stenosis (series 60454, image 12). Right MCA branches are patent distally. Left M1 segment and distal left MCA branches widely patent and well perfused. Posterior circulation: Neither intradural V4 segment visualized on the images provided. Visualized mid in distal vertebral artery patent with antegrade flow. Superior cerebral arteries patent bilaterally. Left PCA supplied via the basilar. Fetal type origin of the right PCA. Probable severe proximal right PCA stenosis (series 09811, image 1). PCAs otherwise right PCA attenuated but grossly patent distally on time-of-flight sequence. Left PCA patent without visible stenosis. Anatomic variants: As above. Other: No intracranial aneurysm. 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 IMPRESSION: MRA HEAD IMPRESSION: 1. Negative intracranial MRA for large vessel occlusion. 2.  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 3. Otherwise wide patency of the major intracranial arterial circulation. MRA NECK IMPRESSION: 1. Atheromatous irregularity about the carotid bifurcations without hemodynamically significant greater than 50% stenosis. 2. Patent antegrade flow within both vertebral arteries within the neck. Right vertebral artery dominant. 3. Tortuosity of the major arterial vasculature of the neck, suggesting chronic underlying hypertension. Electronically Signed   By: Rise Mu M.D.   On: 02/15/2023 17:52   ECHOCARDIOGRAM COMPLETE  Result Date: 02/15/2023    ECHOCARDIOGRAM REPORT   Patient Name:   MICAH FEHER Date of Exam: 02/15/2023 Medical Rec #:  [id number]         Height:       62.0 in Accession #:    [id number]        Weight:       129.0 lb Date of Birth:   Mar 26, 1946         BSA:          1.587 m Patient Age:    76 years          BP:           171/96 mmHg Patient Gender: F                 HR:           85 bpm. [facility] Procedure: 2D Echo, Cardiac Doppler and Color Doppler Indications:    R06.9 DOE  History:        Patient has no prior history of Echocardiogram examinations.                 Signs/Symptoms:Dyspnea; Risk Factors:Hypertension and                 Dyslipidemia.  Sonographer:    Dondra Prader RVT RCS Referring Phys: [phone] Arraya Buck  Sonographer Comments: Technically challenging study due to limited acoustic windows, Technically difficult study due to poor echo windows and suboptimal parasternal window. IMPRESSIONS  1. Left ventricular ejection fraction, by estimation, is 55 to 60%. The left ventricle has normal function. Left ventricular endocardial border not optimally defined to evaluate regional wall motion. Left ventricular diastolic parameters are consistent with Grade I diastolic dysfunction (impaired relaxation).  2. Right ventricular systolic function is normal. The right ventricular size is normal.  3. The mitral valve was not well visualized. No evidence of mitral valve regurgitation. No evidence of mitral stenosis.  4. The aortic valve was not well visualized. Aortic valve regurgitation is not visualized. No aortic stenosis is present.  5. The inferior vena cava is normal in size with greater than 50% respiratory variability, suggesting right atrial pressure of 3 mmHg. FINDINGS  Left Ventricle: Left ventricular ejection fraction, by estimation, is 55 to 60%. The left ventricle has normal function. Left ventricular endocardial border not optimally defined to evaluate regional wall motion. The left ventricular internal cavity size was normal in size. There is no left ventricular hypertrophy. Left ventricular diastolic parameters are consistent with Grade I diastolic dysfunction (impaired relaxation). Normal left ventricular filling  pressure. Right Ventricle: The right ventricular size is normal. Right vetricular wall thickness was not well visualized. Right ventricular systolic function is normal. Left Atrium: Left atrial size was normal in size. Right Atrium: Right atrial size was normal in size. Pericardium: There is no evidence of pericardial effusion. Mitral Valve: The mitral valve was  not well visualized. There is mild thickening of the mitral valve leaflet(s). There is mild calcification of the mitral valve leaflet(s). Mild mitral annular calcification. No evidence of mitral valve regurgitation. No evidence of mitral valve stenosis. Tricuspid Valve: The tricuspid valve is normal in structure. Tricuspid valve regurgitation is not demonstrated. No evidence of tricuspid stenosis. Aortic Valve: The aortic valve was not well visualized. Aortic valve regurgitation is not visualized. No aortic stenosis is present. Aortic valve mean gradient measures 4.0 mmHg. Aortic valve peak gradient measures 8.3 mmHg. Aortic valve area, by VTI measures 2.05 cm. Pulmonic Valve: The pulmonic valve was not well visualized. Pulmonic valve regurgitation is not visualized. No evidence of pulmonic stenosis. Aorta: The aortic root is normal in size and structure. Venous: The inferior vena cava is normal in size with greater than 50% respiratory variability, suggesting right atrial pressure of 3 mmHg. IAS/Shunts: No atrial level shunt detected by color flow Doppler.  LEFT VENTRICLE PLAX 2D LVIDd:         3.50 cm   Diastology LVIDs:         2.60 cm   LV e' medial:    5.22 cm/s LV PW:         0.70 cm   LV E/e' medial:  14.5 LV IVS:        0.80 cm   LV e' lateral:   8.16 cm/s LVOT diam:     2.00 cm   LV E/e' lateral: 9.3 LV SV:         54 LV SV Index:   34 LVOT Area:     3.14 cm  RIGHT VENTRICLE             IVC RV Basal diam:  2.50 cm     IVC diam: 1.70 cm RV S prime:     13.10 cm/s TAPSE (M-mode): 1.8 cm LEFT ATRIUM             Index        RIGHT ATRIUM           Index LA diam:        3.20 cm 2.02 cm/m   RA Area:     9.66 cm LA Vol (A2C):   34.7 ml 21.87 ml/m  RA Volume:   18.60 ml 11.72 ml/m LA Vol (A4C):   32.0 ml 20.17 ml/m LA Biplane Vol: 35.5 ml 22.37 ml/m  AORTIC VALVE                    PULMONIC VALVE AV Area (Vmax):    2.06 cm     PV Vmax:       0.92 m/s AV Area (Vmean):   2.08 cm     PV Peak grad:  3.3 mmHg AV Area (VTI):     2.05 cm AV Vmax:           144.00 cm/s AV Vmean:          93.100 cm/s AV VTI:            0.262 m AV Peak Grad:      8.3 mmHg AV Mean Grad:      4.0 mmHg LVOT Vmax:         94.60 cm/s LVOT Vmean:        61.600 cm/s LVOT VTI:          0.171 m LVOT/AV VTI ratio: 0.65  AORTA Ao Root diam: 2.80 cm MITRAL VALVE MV Area (PHT):  3.81 cm    SHUNTS MV Decel Time: 199 msec    Systemic VTI:  0.17 m MV E velocity: 75.95 cm/s  Systemic Diam: 2.00 cm MV A velocity: 94.60 cm/s MV E/A ratio:  0.80 Dina Rich MD Electronically signed by Dina Rich MD Signature Date/Time: 02/15/2023/11:22:04 AM    Final    MR BRAIN WO CONTRAST  Result Date: 02/15/2023 CLINICAL DATA:  Mental status change, unknown cause. Dizziness, gait instability, chronic hydrocephalus. EXAM: MRI HEAD WITHOUT CONTRAST TECHNIQUE: Multiplanar, multiecho pulse sequences of the brain and surrounding structures were obtained without intravenous contrast. COMPARISON:  MRI brain 09/20/2017.  Head CT 02/14/2023. FINDINGS: Brain: Acute infarct in the right middle frontal gyrus. No acute hemorrhage or significant mass effect. Background of moderate chronic small-vessel disease, similar to prior. Stable severe dilation of the third and lateral ventricles, consistent with aqueductal stenosis. Vascular: Normal flow voids. Skull and upper cervical spine: Normal marrow signal. Sinuses/Orbits: Unremarkable. Other: None. IMPRESSION: 1. Acute infarct in the right middle frontal gyrus. No acute hemorrhage or significant mass effect. 2. Stable severe dilation of the third and lateral  ventricles, consistent with aqueductal stenosis. Electronically Signed   By: Orvan Falconer M.D.   On: 02/15/2023 10:04   CT Head Wo Contrast  Result Date: 02/14/2023 CLINICAL DATA:  Vertigo, central. EXAM: CT HEAD WITHOUT CONTRAST TECHNIQUE: Contiguous axial images were obtained from the base of the skull through the vertex without intravenous contrast. RADIATION DOSE REDUCTION: This exam was performed according to the departmental dose-optimization program which includes automated exposure control, adjustment of the mA and/or kV according to patient size and/or use of iterative reconstruction technique. COMPARISON:  Head CT 02/22/2018. FINDINGS: Brain: No acute hemorrhage. 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 No extra-axial collection. Vascular: No hyperdense vessel or unexpected calcification. Skull: No calvarial fracture or suspicious bone lesion. Skull base is unremarkable. Sinuses/Orbits: No acute finding. Other: None. IMPRESSION: 1. No acute intracranial abnormality. 2. 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 Electronically Signed   By: Orvan Falconer M.D.   On: 02/14/2023 16:11   DG Chest Port 1 View  Result Date: 01/24/2023 CLINICAL DATA:  Chronic cough. EXAM: PORTABLE CHEST 1 VIEW COMPARISON:  11/15/2020 FINDINGS: Lung apices obscured by the patient's face. The lungs are clear without focal pneumonia, edema, pneumothorax or pleural effusion. Cardiopericardial silhouette is at upper limits of normal for size. No acute bony abnormality. Telemetry leads overlie the chest. IMPRESSION: No active disease. Electronically Signed   By: Kennith Center M.D.   On: 01/24/2023 12:23    Catarina Hartshorn, DO  [facility]  If 7PM-7AM, please contact night-coverage [email or web address] Password [facility] 02/17/2023, 10:36 AM   LOS: 1 day

## 2023-02-17 NOTE — TOC Progression Note (Signed)
Transition of Care [facility]) - Progression Note    Patient Details  Name: Leah Payne MRN: [id number] Date of Birth: Jul 03, 1946  Transition of Care [facility]) CM/SW Contact  Elliot Gault, LCSW Phone Number: 02/17/2023, 10:30 AM  Clinical Narrative:     Per MD, pt transferring to [facility]. Administrator, sports at [facility]. [facility] TOC will follow.  Expected Discharge Plan: Skilled Nursing Facility Barriers to Discharge: Insurance Authorization  Expected Discharge Plan and Services       Living arrangements for the past 2 months: Assisted Living Facility                                       Social Determinants of Health (SDOH) Interventions SDOH Screenings   Food Insecurity: No Food Insecurity (02/15/2023)  Housing: Patient Declined (02/15/2023)  Transportation Needs: Unknown (02/15/2023)  Utilities: Not At Risk (02/15/2023)  Alcohol Screen: Low Risk  (07/06/2021)  Depression (PHQ2-9): Low Risk  (07/06/2021)  Financial Resource Strain: Low Risk  (07/06/2021)  Physical Activity: Inactive (07/06/2021)  Social Connections: Socially Integrated (07/06/2021)  Stress: No Stress Concern Present (07/06/2021)  Tobacco Use: Low Risk  (01/25/2023)    Readmission Risk Interventions     No data to display

## 2023-02-17 NOTE — Progress Notes (Incomplete)
Neurology Detailed Progress Note  History is obtained from:***  HPI: [name] is a 77 y.o. female ***   LKW: *** Thrombolytic given?: No, or if yes, time given ***  Checklist of contraindications was reviewed and negative. Risks, benefits and alternatives were discussed *** IA performed?: No, or if yes, groin puncture time: *** Premorbid modified rankin scale: ***     0 - No symptoms.     1 - No significant disability. Able to carry out all usual activities, despite some symptoms.     2 - Slight disability. Able to look after own affairs without assistance, but unable to carry out all previous activities.     3 - Moderate disability. Requires some help, but able to walk unassisted.     4 - Moderately severe disability. Unable to attend to own bodily needs without assistance, and unable to walk unassisted.     5 - Severe disability. Requires constant nursing care and attention, bedridden, incontinent.     6 - Dead.  ROS: All other review of systems was negative except as noted in the HPI. *** Unable to obtain due to altered mental status.   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 She has never used smokeless tobacco. She reports that she does not drink alcohol and does not use drugs. ***  Exam: Current vital signs: BP (!) 166/76   Pulse 90   Temp 99.4 F (37.4 C) (Oral)   Resp (!) 29   Ht 5\' 2"  (1.575 m)   Wt 52.2 kg   SpO2 95%   BMI 21.05 kg/m  Vital signs in last 24 hours: Temp:  [97.4 F (36.3 C)-99.4 F (37.4 C)] 99.4 F (37.4 C) (07/26 1940) Pulse Rate:  [36-110] 90 (07/26 1940) Resp:  [0-29] 29 (07/26 1940) BP: (129-184)/(54-97) 166/76 (07/26 1900) SpO2:  [83 %-100 %] 95 % (07/26 1940) Weight:  [52.2 kg] 52.2 kg (07/26 0504)   Physical Exam  Constitutional: Appears well-developed and well-nourished.  Psych: Affect appropriate to situation, *** Eyes: No scleral injection HENT: No oropharyngeal obstruction.  MSK: no joint deformities.  Cardiovascular: Normal rate and regular rhythm. *** Perfusing extremities well Respiratory: Effort normal, non-labored breathing GI: Soft.  No distension. There is no tenderness.  Skin: Warm dry and intact visible skin  Neuro: Mental Status: Patient is awake, alert, oriented to person, place, month, year, and situation.*** Patient is able to give a clear and coherent history.*** No signs of aphasia or neglect*** Cranial Nerves: II:  Visual Fields are full. Pupils are equal, round, and reactive to light.  *** III,IV, VI: EOMI without ptosis or diploplia.  V: Facial sensation is symmetric to temperature VII: Facial movement is symmetric.  VIII: hearing is intact to voice X: Uvula elevates symmetrically XI: Shoulder shrug is symmetric. XII: tongue is midline without atrophy or fasciculations.  Motor: Tone is normal. Bulk is normal. 5/5 strength was present in all four extremities. *** Sensory: Sensation is symmetric to light touch and temperature in the arms and legs.*** Deep Tendon Reflexes: 2+ and symmetric in the brachioradialis and  patellae. *** Plantars: Toes are downgoing bilaterally. *** Cerebellar: FNF and HKS are intact bilaterally*** Gait:  Deferred in acute setting ***  NIHSS total *** Score breakdown: *** Performed at *** time of patient arrival to ED    I have reviewed labs in epic and the results pertinent to this consultation are:  Basic Metabolic Panel: Recent Labs  Lab 02/14/23 1531 02/15/23 0640 02/15/23 2041 02/16/23 0532 02/17/23 0517  NA 133* 138 135 133* 133*  K 3.1* 3.2* 2.7* 4.0 3.6  CL 96* 104 98 102 99  CO2 26 26 24 22  20*  GLUCOSE 117* 85 98 98 75  BUN 10 7* 7* 7* 7*  CREATININE 0.59 0.45 0.58 0.56 0.57  CALCIUM 8.7* 8.3* 8.0* 7.9* 8.3*  MG  --  1.5* 1.9 2.0 1.6*    CBC: Recent Labs  Lab 02/14/23 1531 02/15/23 0423 02/15/23 2041  WBC 8.2 5.9 5.5  NEUTROABS  --  3.5  --   HGB 12.6 11.4* 12.8  HCT 38.0 34.2* 39.3  MCV 88.0 88.4 88.7  PLT 263 251 288    Coagulation Studies: No results for input(s): "LABPROT", "INR" in the last 72 hours.   Lab Results  Component Value Date   CHOL 143 03/10/2022   HDL 56 03/10/2022   LDLCALC 70 03/10/2022   LDLDIRECT 100.0 09/17/2015   TRIG 90 03/10/2022   CHOLHDL 2.6 03/10/2022   Lab Results  Component Value Date   HGBA1C 5.6 02/15/2023     I have reviewed the images obtained:***  MRI brain personally reviewed, agree with radiology:   1. Acute infarct in the right middle frontal gyrus. No acute hemorrhage or significant mass effect. 2. Stable severe dilation of the third and lateral ventricles, consistent with aqueductal stenosis.  MRA head and neck personally reviewed, agree with radiology:  Head: 1. Negative intracranial MRA for large vessel occlusion. 2. 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 3. Otherwise wide patency of the major intracranial arterial circulation. Neck: 1. Atheromatous  irregularity about the carotid bifurcations without hemodynamically significant greater than 50% stenosis. 2. Patent antegrade flow within both vertebral arteries within the neck. Right vertebral artery dominant. 3. Tortuosity of the major arterial vasculature of the neck, suggesting chronic underlying hypertension.  ECHO  1. Left ventricular ejection fraction, by estimation, is 55 to 60%. The  left ventricle has normal function. Left ventricular endocardial border  not optimally defined to evaluate regional wall motion. Left ventricular  diastolic parameters are consistent  with Grade I diastolic dysfunction (impaired relaxation).   2. Right ventricular systolic function is normal. The right ventricular  size is normal.   3. The mitral valve was not well visualized. No evidence of mitral valve  regurgitation. No evidence of mitral stenosis.   4. The aortic valve was not well visualized. Aortic valve regurgitation  is not visualized. No aortic stenosis is present.   5. The inferior vena cava is normal in size with greater than 50%  respiratory variability, suggesting right atrial pressure of 3 mmHg.  [Normal biatrial sizes]  EEG: 7/26 This technically difficult study is suggestive of cortical dysfunction arising from bifrontal region likely secondary to underlying structural abnormality. No seizures were seen throughout the recording.  Sharp transients were noted in left hemisphere, maximal left temporal region. These discharges could be epileptiform but difficult to be certain due to significant electrode and movement artifact. Recommend repeating EEG if concern for interictal activity persists.   Impression: ***  Recommendations: - ***   Brooke Dare MD-PhD [facility] [phone]   *** [facility], [facility], Teleneuro    Total critical care time: *** minutes   Critical care time was exclusive of separately billable procedures and treating other patients.   Critical  care was necessary to treat or prevent imminent or life-threatening deterioration.   Critical care was time spent personally by me on the following activities: development of treatment plan with patient and/or surrogate as well as nursing, discussions with consultants/primary team, evaluation of patient's response to treatment, examination of patient, obtaining history from patient or surrogate, ordering and performing treatments and interventions, ordering and review of laboratory studies, ordering and review of radiographic studies, and re-evaluation of patient's condition as needed, as documented above.

## 2023-02-18 ENCOUNTER — Inpatient Hospital Stay (HOSPITAL_COMMUNITY): Payer: 59

## 2023-02-18 DIAGNOSIS — R569 Unspecified convulsions: Secondary | ICD-10-CM | POA: Diagnosis not present

## 2023-02-18 DIAGNOSIS — R531 Weakness: Secondary | ICD-10-CM | POA: Diagnosis not present

## 2023-02-18 LAB — GLUCOSE, CAPILLARY
Glucose-Capillary: 66 mg/dL — ABNORMAL LOW (ref 70–99)
Glucose-Capillary: 73 mg/dL (ref 70–99)
Glucose-Capillary: 100 mg/dL — ABNORMAL HIGH (ref 70–99)
Glucose-Capillary: 75 mg/dL (ref 70–99)
Glucose-Capillary: 72 mg/dL (ref 70–99)

## 2023-02-18 LAB — CBC
HCT: 34 % — ABNORMAL LOW (ref 36.0–46.0)
Hemoglobin: 11.2 g/dL — ABNORMAL LOW (ref 12.0–15.0)
MCH: 28.5 pg (ref 26.0–34.0)
MCHC: 32.9 g/dL (ref 30.0–36.0)
MCV: 86.5 fL (ref 80.0–100.0)
Platelets: 237 10*3/uL (ref 150–400)
RBC: 3.93 MIL/uL (ref 3.87–5.11)
RDW: 13.1 % (ref 11.5–15.5)
WBC: 3.6 10*3/uL — ABNORMAL LOW (ref 4.0–10.5)
nRBC: 0 % (ref 0.0–0.2)

## 2023-02-18 LAB — BASIC METABOLIC PANEL WITH GFR
Anion gap: 12 (ref 5–15)
BUN: 6 mg/dL — ABNORMAL LOW (ref 8–23)
CO2: 23 mmol/L (ref 22–32)
Calcium: 8.5 mg/dL — ABNORMAL LOW (ref 8.9–10.3)
Chloride: 98 mmol/L (ref 98–111)
Creatinine, Ser: 0.69 mg/dL (ref 0.44–1.00)
GFR, Estimated: >60 mL/min (ref >60)
Glucose, Bld: 72 mg/dL (ref 70–99)
Potassium: 3.5 mmol/L (ref 3.5–5.1)
Sodium: 133 mmol/L — ABNORMAL LOW (ref 135–145)

## 2023-02-18 LAB — LIPID PANEL
Cholesterol: 123 mg/dL (ref 0–200)
HDL: 35 mg/dL — ABNORMAL LOW (ref >40)
LDL Cholesterol: 76 mg/dL (ref 0–99)
Total CHOL/HDL Ratio: 3.5 RATIO
Triglycerides: 62 mg/dL (ref <150)
VLDL: 12 mg/dL (ref 0–40)

## 2023-02-18 LAB — MAGNESIUM: Magnesium: 1.5 mg/dL — ABNORMAL LOW (ref 1.7–2.4)

## 2023-02-18 MED ORDER — THIAMINE HCL 100 MG/ML IJ SOLN
250.0000 mg | Freq: Every day | INTRAVENOUS | Status: DC
  Administered 2023-02-20: 250 mg via INTRAVENOUS
  Filled 2023-02-18: qty 2.5

## 2023-02-18 MED ORDER — THIAMINE HCL 100 MG/ML IJ SOLN: 100.0000 mg | Freq: Every day | INTRAMUSCULAR | Status: DC

## 2023-02-18 MED ORDER — THIAMINE HCL 100 MG/ML IJ SOLN
500.0000 mg | Freq: Three times a day (TID) | INTRAVENOUS | Status: AC
  Administered 2023-02-18 – 2023-02-19 (×6): 500 mg via INTRAVENOUS
  Filled 2023-02-18 (×6): qty 5

## 2023-02-18 MED ORDER — MAGNESIUM SULFATE 2 GM/50ML IV SOLN
2.0000 g | Freq: Once | INTRAVENOUS | Status: AC
  Administered 2023-02-18: 2 g via INTRAVENOUS
  Filled 2023-02-18: qty 50

## 2023-02-18 NOTE — Progress Notes (Signed)
PROGRESS NOTE    Leah Payne  YQM:[id number] DOB: 1945-08-07 DOA: 02/14/2023 PCP: Tommie Sams, DO   Brief Narrative:  77 year old female with a history of dementia, diabetes mellitus type 2, hyperlipidemia, seizure disorder, hydrocephalus, hypothyroidism, WPW presenting with dizziness and gait instability(normally independent with ambulation).  The patient resides at [facility].  Imaging in the ED showed acute infarct with persistent severe dilation of the third and lateral ventricles, ultimately noted to have seizure 7/24 evening with recurrent seizure 10 minutes later.  Hospitalist called for admission, neurology called in consult.  Assessment & Plan:   Principal Problem:   Generalized weakness Active Problems:   Seizure disorder (HCC)   Hyperlipidemia associated with type 2 diabetes mellitus (HCC)   UTI (urinary tract infection)   Type 2 diabetes, controlled, 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HCC)   Hypothyroidism   Dizziness   Hypokalemia   Gait instability   Major neurocognitive disorder (HCC)   Acute ischemic stroke (HCC)  Acute ischemic stroke -MRI brain--acute infarct in the right middle frontal gyrus. -MRA brain negative for LVO, negative for hemodynamically significant stenosis of the carotids. 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 coli positive, POA  -Tachycardic tachypneic with mental status changes concerning for severe sepsis, source UTI -Continue  amoxil x 24 hours to finish 5 days of therapy  Acute breakthrough seizure -on keppra at home -increased dose 750 twice daily -Patient had 3 seizures on the evening of 02/15/2023 -Patient much more awake alert this morning, neurology following -EEG ongoing, appreciate neurology insight and recommendations   Hypothyroidism -Continue levothyroxine   Mixed hyperlipidemia -Continue statin   Controlled diabetes mellitus type 2 -NovoLog sliding scale -Check hemoglobin A1c -- 5.6   Major neurocognitive disorder -Continue Aricept   Hypokalemia/hypomagnesemia -Repleting  DVT prophylaxis: heparin injection 5,000 Units Start: 02/15/23 0600 SCDs Start: 02/15/23 0130 Code Status:   Code Status: DNR Family Communication: None present  Status is: Inpatient  Dispo: The patient is from: Facility              Anticipated d/c is to: Same              Anticipated d/c date is: 40 to 72 hours pending clinical course              Patient currently not medically stable for discharge  Consultants:  Neurology  Procedures:  None  Antimicrobials:  Amoxicillin, stop date 7/28  Subjective: No acute issues or events overnight, patient much more awake alert this morning, oriented to self only.  Review of systems somewhat limited but denies overt pain fevers chills or shortness of breath.   Objective: Vitals:   02/17/23 1940 02/17/23 2111 02/17/23 2325 02/18/23 0347  BP:  (!) 143/80 (!) 146/104 (!) 150/77  Pulse: 90 91 89 81  Resp: (!) 29 20    Temp: 99.4 F (37.4 C) 98 F (36.7 C) 99.6 F (37.6 C) 98.9 F (37.2 C)  TempSrc: Oral Oral Oral Oral  SpO2: 95% 98% 96% 99%  Weight:      Height:  Intake/Output Summary (Last 24 hours) at 02/18/2023 0718 Last data filed at 02/17/2023 1711 Gross per 24 hour  Intake 765.11 ml  Output --  Net 765.11 ml   Filed Weights   02/15/23 2049 02/16/23 0432 02/17/23 0504  Weight: 51.5 kg 51.5 kg 52.2 kg    Examination:  General:  Pleasantly  resting in bed, No acute distress. HEENT: Dry mucous membranes with poor dentition, hirsutism, enlarged frontal cranium. Neck:  Without mass or deformity.  Trachea is midline. Lungs:  Clear to auscultate bilaterally without rhonchi, wheeze, or rales. Heart:  Regular rate and rhythm.  Without murmurs, rubs, or gallops. Abdomen:  Soft, nontender, nondistended.  Without guarding or rebound. Extremities: Without cyanosis, clubbing, edema, or obvious deformity. Skin:  Warm and dry, no erythema.  Data Reviewed: I have personally reviewed following labs and imaging studies  CBC: Recent Labs  Lab 02/14/23 1531 02/15/23 0423 02/15/23 2041  WBC 8.2 5.9 5.5  NEUTROABS  --  3.5  --   HGB 12.6 11.4* 12.8  HCT 38.0 34.2* 39.3  MCV 88.0 88.4 88.7  PLT 263 251 288   Basic Metabolic Panel: Recent Labs  Lab 02/14/23 1531 02/15/23 0640 02/15/23 2041 02/16/23 0532 02/17/23 0517  NA 133* 138 135 133* 133*  K 3.1* 3.2* 2.7* 4.0 3.6  CL 96* 104 98 102 99  CO2 26 26 24 22  20*  GLUCOSE 117* 85 98 98 75  BUN 10 7* 7* 7* 7*  CREATININE 0.59 0.45 0.58 0.56 0.57  CALCIUM 8.7* 8.3* 8.0* 7.9* 8.3*  MG  --  1.5* 1.9 2.0 1.6*   GFR: Estimated Creatinine Clearance: 46.6 mL/min (by C-G formula based on SCr of 0.57 mg/dL). Liver Function Tests: Recent Labs  Lab 02/14/23 1531 02/15/23 0640 02/15/23 2041  AST 18 16 24   ALT 16 14 14   ALKPHOS 77 65 72  BILITOT 0.9 0.9 1.0  PROT 7.2 6.7 7.4  ALBUMIN 3.3* 3.0* 3.3*   No results for input(s): "LIPASE", "AMYLASE" in the last 168 hours. No results for input(s): "AMMONIA" in the last 168 hours. Coagulation Profile: No results for input(s): "INR", "PROTIME" in the last 168 hours. Cardiac Enzymes: No results for input(s): "CKTOTAL", "CKMB", "CKMBINDEX", "TROPONINI" in the last 168 hours. BNP (last 3 results) No results for input(s): "PROBNP" in the last 8760 hours. HbA1C: No results for input(s): "HGBA1C" in the last 72 hours. CBG: Recent Labs   Lab 02/16/23 1600 02/16/23 2059 02/17/23 0737 02/17/23 1109 02/17/23 1602  GLUCAP 76 84 72 88 70   Lipid Profile: Recent Labs    02/18/23 0304  CHOL 123  HDL 35*  LDLCALC 76  TRIG 62  CHOLHDL 3.5   Thyroid Function Tests: No results for input(s): "TSH", "T4TOTAL", "FREET4", "T3FREE", "THYROIDAB" in the last 72 hours. Anemia Panel: No results for input(s): "VITAMINB12", "FOLATE", "FERRITIN", "TIBC", "IRON", "RETICCTPCT" in the last 72 hours. Sepsis Labs: Recent Labs  Lab 02/15/23 2041 02/15/23 2350  LATICACIDVEN 4.5* 0.8    Recent Results (from the past 240 hour(s))  Urine Culture     Status: Abnormal (Preliminary result)   Collection Time: 02/14/23  9:37 PM   Specimen: Urine, Random  Result Value Ref Range Status   Specimen Description   Final    URINE, RANDOM Performed at [facility], [address], [place], [place] [address]    Special Requests URINE, RANDOM  Final   Culture (A)  Final    >=100,000 COLONIES/mL ESCHERICHIA COLI CULTURE REINCUBATED FOR BETTER GROWTH  Performed at [facility], [address], [place], [place] [address]    Report Status PENDING  Incomplete   Organism ID, Bacteria ESCHERICHIA COLI (A)  Final      Susceptibility   Escherichia coli - MIC*    AMPICILLIN 4 SENSITIVE Sensitive     CEFAZOLIN <=4 SENSITIVE Sensitive     CEFEPIME <=0.12 SENSITIVE Sensitive     CEFTRIAXONE <=0.25 SENSITIVE Sensitive     CIPROFLOXACIN <=0.25 SENSITIVE Sensitive     GENTAMICIN <=1 SENSITIVE Sensitive     IMIPENEM <=0.25 SENSITIVE Sensitive     NITROFURANTOIN <=16 SENSITIVE Sensitive     TRIMETH/SULFA <=20 SENSITIVE Sensitive     AMPICILLIN/SULBACTAM <=2 SENSITIVE Sensitive     PIP/TAZO <=4 SENSITIVE Sensitive     * >=100,000 COLONIES/mL ESCHERICHIA COLI  MRSA Next Gen by PCR, Nasal     Status: None   Collection Time: 02/15/23  8:51 PM   Specimen: Nasal Mucosa; Nasal Swab  Result Value Ref Range Status   MRSA by PCR Next Gen NOT DETECTED NOT  DETECTED Final    Comment: (NOTE) The GeneXpert MRSA Assay (FDA approved for NASAL specimens only), is one component of a comprehensive MRSA colonization surveillance program. It is not intended to diagnose MRSA infection nor to guide or monitor treatment for MRSA infections. Test performance is not FDA approved in patients less than 98 years old. Performed at [facility], [address] [place], [place] [address]          Radiology Studies: EEG adult  Result Date: 2023/02/26 Charlsie Quest, MD     02/18/2023  7:05 AM Patient Name: ARYNN CERRETA MRN: [id number] Epilepsy Attending: Charlsie Quest Referring Physician/Provider: Catarina Hartshorn, MD Date: 02/26/2023 Duration: 28.32 mins Patient history: 77 year old female with history of seizures. Overnight patient had 3 generalized tonic-clonic seizures, not back to baseline. EEG to evaluate for seizure Level of alertness: Awake AEDs during EEG study: LEV Technical aspects: This EEG study was done with scalp electrodes positioned according to the 10-20 International system of electrode placement. Electrical activity was reviewed with band pass filter of 1-70Hz , sensitivity of 7 uV/mm, display speed of 29mm/sec with a 60Hz  notched filter applied as appropriate. EEG data were recorded continuously and digitally stored.  Video monitoring was available and reviewed as appropriate. Description: The posterior dominant rhythm consists of 7.5 Hz activity of moderate voltage (25-35 uV) seen predominantly in posterior head regions, symmetric and reactive to eye opening and eye closing. EEG showed continuous 2-3hz  delta slowing admixed with 13-15hz  beta activity in bifrontal region. Sharp transients were noted in left hemisphere, maximal left temporal region, qasi- periodic at 0.5-1Hz . These discharges could be epileptiform but difficult to be certain due to significant electrode and movement artifact. Hyperventilation and photic stimulation were not performed.  Of note, study was technically difficult due to significant movement and electrode artifact.   ABNORMALITY - Continuous slow, bifrontal region IMPRESSION: This technically difficult study is suggestive of cortical dysfunction arising from bifrontal region likely secondary to underlying structural abnormality. No seizures were seen throughout the recording. Sharp transients were noted in left hemisphere, maximal left temporal region. These discharges could be epileptiform but difficult to be certain due to significant electrode and movement artifact. Recommend repeating EEG if concern for interictal activity persists. Priyanka Annabelle Harman        Scheduled Meds:  amoxicillin  500 mg Oral Q8H   aspirin  81 mg Oral Daily   atorvastatin  40  mg Oral Daily   Chlorhexidine Gluconate Cloth  6 each Topical Daily   clopidogrel  75 mg Oral Daily   donepezil  10 mg Oral QHS   fesoterodine  8 mg Oral Daily   heparin  5,000 Units Subcutaneous Q8H   insulin aspart  0-9 Units Subcutaneous TID WC   levETIRAcetam  750 mg Oral BID   levothyroxine  25 mcg Oral Q0600   mirabegron ER  50 mg Oral Daily   sertraline  100 mg Oral QHS   [START ON 02/26/2023] thiamine (VITAMIN B1) injection  100 mg Intravenous Daily   vitamin B-12  500 mcg Oral Daily   Continuous Infusions:  levETIRAcetam     thiamine (VITAMIN B1) injection 500 mg (02/18/23 0649)   Followed by   Melene Muller ON 02/20/2023] thiamine (VITAMIN B1) injection       LOS: 2 days   Time spent:  Azucena Fallen, DO [facility]  If 7PM-7AM, please contact night-coverage [email or web address]  02/18/2023, 7:18 AM

## 2023-02-18 NOTE — Plan of Care (Signed)

## 2023-02-18 NOTE — Procedures (Addendum)
Patient Name: Leah Payne  MRN: [id number]  Epilepsy Attending: Charlsie Quest  Referring Physician/Provider: Charlsie Quest, MD  Duration: 02/18/2023 0104 to 1331   Patient history: 77 year old female with history of seizures. Overnight patient had 3 generalized tonic-clonic seizures, not back to baseline. EEG to evaluate for seizure   Level of alertness: Awake, asleep   AEDs during EEG study: LEV   Technical aspects: This EEG study was done with scalp electrodes positioned according to the 10-20 International system of electrode placement. Electrical activity was reviewed with band pass filter of 1-70Hz , sensitivity of 7 uV/mm, display speed of 79mm/sec with a 60Hz  notched filter applied as appropriate. EEG data were recorded continuously and digitally stored.  Video monitoring was available and reviewed as appropriate.   Description: The posterior dominant rhythm consists of 7 Hz activity of moderate voltage (25-35 uV) seen predominantly in posterior head regions, symmetric and reactive to eye opening and eye closing. Sleep was characterized by sleep spindles (12-14hz ), maximal fronto-central region. EEG showed continuous 2-3hz  delta slowing in left hemisphere, maximal bifrontal region. Abundant sharp waves were noted in left hemisphere, maximal left temporal region, qasi periodic at 0.5hz . Hyperventilation and photic stimulation were not performed.    Of note, study was technically difficult due to significant movement and electrode artifact.      ABNORMALITY - Sharp waves, left hemisphere, maximal left temporal region - Continuous slow, left hemisphere, maximal bifrontal region - Background slow   IMPRESSION: This technically difficult study showed evidence of epileptogenicity arising from left hemisphere, maximal left temporal region. Additionally there is cortical dysfunction arising from left hemisphere and bifrontal region likely secondary to underlying structural  abnormality. Lastly there is mild diffuse encephalopathy. No seizures were seen throughout the recording.    Jarelle Ates Annabelle Harman

## 2023-02-18 NOTE — Progress Notes (Addendum)
LTM EEG hooked up and running - no initial skin breakdown -[facility] monitored, Event button test confirmed by [facility].

## 2023-02-18 NOTE — Progress Notes (Signed)
LTM EEG discontinued - no skin breakdown at unhook.   

## 2023-02-19 ENCOUNTER — Encounter (HOSPITAL_COMMUNITY): Payer: Self-pay | Admitting: Internal Medicine

## 2023-02-19 DIAGNOSIS — R531 Weakness: Secondary | ICD-10-CM | POA: Diagnosis not present

## 2023-02-19 LAB — GLUCOSE, CAPILLARY
Glucose-Capillary: 68 mg/dL — ABNORMAL LOW (ref 70–99)
Glucose-Capillary: 69 mg/dL — ABNORMAL LOW (ref 70–99)
Glucose-Capillary: 90 mg/dL (ref 70–99)
Glucose-Capillary: 78 mg/dL (ref 70–99)
Glucose-Capillary: 87 mg/dL (ref 70–99)
Glucose-Capillary: 76 mg/dL (ref 70–99)

## 2023-02-19 MED ORDER — ENSURE ENLIVE PO LIQD
237.0000 mL | Freq: Two times a day (BID) | ORAL | Status: DC
  Administered 2023-02-20: 237 mL via ORAL

## 2023-02-19 NOTE — Progress Notes (Addendum)
Pt had hypoglycemic event, blood sugar 68. 4oz of apple juice given and CBG 69. Another 4oz of juice given, and CBG 90 on recheck. Pt asymptomatic.  Natale Milch, MD notified.

## 2023-02-19 NOTE — Progress Notes (Signed)
Neurology Progress Note   Brief HPI: Leah Payne is a 77 y.o. 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  MRI brain showed an acute right frontal infarct for which she was evaluated by video by Dr. Melynda Ripple on 7/24.  Stroke was felt to be most likely incidental and she was also noted to have an E. coli UTI.  However she did have an MRA with concern for severe right M1 stenosis.  Therefore she was started on dual antiplatelet therapy for planned 90 days.  A1c was noted to be meeting goal at 5.6% and ECHO revealed only grade 1 diastolic dysfunction.  on 7/24 she had several generalized tonic-clonic seizures which improved after loading with IV Keppra 1000 mg.  Of note her potassium at the time was low at 2.7; her magnesium was also been low with most recent magnesium 1.6.  She has also been found to have low B12 at 167 and low folate of 4.3 which are being appropriately supplemented.  Her Keppra was increased from 500 twice daily to 750 twice daily.  However due to persistent altered mental status and concern for possible left gaze preference, she was transferred to [facility] for continuous EEG monitoring and inpatient neurology evaluation  Baseline:  poor short-term memory with difficulty walking with falls.   Interval Hx:  LTM has been negative for seizures.  No reported seizures overnight.  She has no complaints.  Exam: Current vital signs: BP 129/81 (BP Location: Left Arm)   Pulse 79   Temp 98.2 F (36.8 C) (Axillary)   Resp 16   Ht 5\' 2"  (1.575 m)   Wt 52.2 kg   SpO2 98%   BMI 21.05 kg/m  Vital signs in last 24 hours: Temp:  [98 F (36.7 C)-99.2 F  (37.3 C)] 98.2 F (36.8 C) (07/28 0410) Pulse Rate:  [79-96] 79 (07/28 0410) Resp:  [14-17] 16 (07/28 0410) BP: (114-147)/(72-88) 129/81 (07/28 0410) SpO2:  [98 %-99 %] 98 % (07/28 0410)   Physical Exam  General: Appears chronically ill.  Laying in bed. She is sleepy but easily arousable to voice.  She can tell her name, she thinks she is at home.  She otherwise will trail off and not answer the rest of my questions. Pupils equal round reactive.  No forced gaze deviation. Moves all 4 extremities to command but appears t to have generalized weakness.   Basic Metabolic Panel: Recent Labs  Lab 02/14/23 1531 02/15/23 0640 02/15/23 2041 02/16/23 0532 02/17/23 0517  NA 133* 138 135 133* 133*  K 3.1* 3.2* 2.7* 4.0 3.6  CL 96* 104 98 102 99  CO2 26 26 24 22  20*  GLUCOSE 117* 85 98 98 75  BUN 10 7* 7* 7* 7*  CREATININE 0.59 0.45 0.58 0.56 0.57  CALCIUM 8.7* 8.3* 8.0* 7.9* 8.3*  MG  --  1.5* 1.9 2.0 1.6*    CBC: Recent Labs  Lab 02/14/23 1531 02/15/23 0423 02/15/23 2041  WBC 8.2 5.9 5.5  NEUTROABS  --  3.5  --   HGB 12.6 11.4* 12.8  HCT 38.0 34.2* 39.3  MCV 88.0 88.4 88.7  PLT 263 251 288    Coagulation Studies: No results for input(s): "LABPROT", "INR" in the last  72 hours.   Lab Results  Component Value Date   CHOL 123 02/18/2023   HDL 35 (L) 02/18/2023   LDLCALC 76 02/18/2023   LDLDIRECT 100.0 09/17/2015   TRIG 62 02/18/2023   CHOLHDL 3.5 02/18/2023   Lab Results  Component Value Date   HGBA1C 5.6 02/15/2023     MRI brain personally reviewed, agree with radiology:   1. Acute infarct in the right middle frontal gyrus. No acute hemorrhage or significant mass effect. 2. Stable severe dilation of the third and lateral ventricles, consistent with aqueductal stenosis.  MRA head and neck personally reviewed, agree with radiology:  Head: 1. Negative intracranial MRA for large vessel occlusion. 2. Intracranial atherosclerotic disease with resultant moderate  to severe proximal right M1 and M2 stenoses, within additional severe proximal right PCA stenosis. 3. Otherwise wide patency of the major intracranial arterial circulation. Neck: 1. Atheromatous irregularity about the carotid bifurcations without hemodynamically significant greater than 50% stenosis. 2. Patent antegrade flow within both vertebral arteries within the neck. Right vertebral artery dominant. 3. Tortuosity of the major arterial vasculature of the neck, suggesting chronic underlying hypertension.  ECHO 02/15/23  1. Left ventricular ejection fraction, by estimation, is 55 to 60%. The  left ventricle has normal function. Left ventricular endocardial border  not optimally defined to evaluate regional wall motion. Left ventricular  diastolic parameters are consistent  with Grade I diastolic dysfunction (impaired relaxation).   2. Right ventricular systolic function is normal. The right ventricular  size is normal.   3. The mitral valve was not well visualized. No evidence of mitral valve  regurgitation. No evidence of mitral stenosis.   4. The aortic valve was not well visualized. Aortic valve regurgitation  is not visualized. No aortic stenosis is present.   5. The inferior vena cava is normal in size with greater than 50%  respiratory variability, suggesting right atrial pressure of 3 mmHg.  [Normal biatrial sizes]  EEG: 7/26 This technically difficult study is suggestive of cortical dysfunction arising from bifrontal region likely secondary to underlying structural abnormality. No seizures were seen throughout the recording.  Sharp transients were noted in left hemisphere, maximal left temporal region. These discharges could be epileptiform but difficult to be certain due to significant electrode and movement artifact. Recommend repeating EEG if concern for interictal activity persists.  EEG: 7/27:  This technically difficult study showed evidence of epileptogenicity  arising from left hemisphere, maximal left temporal region. Additionally there is cortical dysfunction arising from left hemisphere and bifrontal region likely secondary to underlying structural abnormality. Lastly there is mild diffuse encephalopathy. No seizures were seen throughout the recording.    Assessment:  77 year old with congenital hydrocephalus and likely baseline cognitive decline.    LTM negative for seizures.  Continue supportive care.  Neurology sign off.

## 2023-02-19 NOTE — Progress Notes (Signed)
PROGRESS NOTE    Leah Payne  NWG:[id number] DOB: 08/10/1945 DOA: 02/14/2023 PCP: Tommie Sams, DO   Brief Narrative:  77 year old female with a history of dementia, diabetes mellitus type 2, hyperlipidemia, seizure disorder, hydrocephalus, hypothyroidism, WPW presenting with dizziness and gait instability(normally independent with ambulation).  The patient resides at [facility].  Imaging in the ED showed acute infarct with persistent severe dilation of the third and lateral ventricles, ultimately noted to have seizure 7/24 evening with recurrent seizure 10 minutes later.  Hospitalist called for admission, neurology called in consult.  Assessment & Plan:   Principal Problem:   Generalized weakness Active Problems:   Seizure disorder (HCC)   Hyperlipidemia associated with type 2 diabetes mellitus (HCC)   UTI (urinary tract infection)   Type 2 diabetes, controlled, 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HCC)   Hypothyroidism   Dizziness   Hypokalemia   Gait instability   Major neurocognitive disorder (HCC)   Acute ischemic stroke (HCC)  Acute ischemic stroke -MRI brain--acute infarct in the right middle frontal gyrus. -MRA brain negative for LVO, negative for hemodynamically significant stenosis of the carotids. 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 coli positive, POA  -Tachycardic tachypneic with  mental status changes concerning for severe sepsis, source UTI -Continue amoxil x 24 hours to finish 5 days of therapy  Acute breakthrough seizure -on keppra at home -increased dose 750 twice daily -Patient had 3 seizures on the evening of 02/15/2023 -Patient much more awake alert this morning, neurology following -EEG ongoing, appreciate neurology insight and recommendations   Hypothyroidism -Continue levothyroxine   Mixed hyperlipidemia -Continue statin   Controlled diabetes mellitus type 2 -NovoLog sliding scale -Hemoglobin A1c -- 5.6 -Hypoglycemic event this morning, likely in setting of poor p.o. intake, will liberalize diet to improve p.o. intake, patient has not received sliding scale insulin now in over 24 hours would consider discontinuing all diabetic medications(metformin) at facility until glucose returns back to normal and p.o. intake has improved.  Major neurocognitive disorder -Continue Aricept   Hypokalemia/hypomagnesemia -Repleting  DVT prophylaxis: heparin injection 5,000 Units Start: 02/15/23 0600 SCDs Start: 02/15/23 0130 Code Status:   Code Status: DNR Family Communication: None present  Status is: Inpatient  Dispo: The patient is from: Facility              Anticipated d/c is to: Same              Anticipated d/c date is: 48 to 72 hours pending clinical course              Patient currently not medically stable for discharge  Consultants:  Neurology  Procedures:  None  Antimicrobials:  Amoxicillin, stop date 7/28  Subjective: No acute issues or events overnight, somnolent but easily arousable, review of systems limited but patient denies chest pain or shortness of breath.   Objective: Vitals:   02/18/23 1619 02/18/23 2025 02/18/23 2346 02/19/23 0410  BP: (!) 145/72 (!) 146/82 114/88 129/81  Pulse: 90 89 96 79  Resp: 16 16 17 16   Temp: 98.8 F (37.1 C) 99.2 F (37.3 C) 98.6 F (37 C) 98.2 F (36.8 C)  TempSrc: Oral Axillary Axillary  Axillary  SpO2: 98% 98% 99% 98%  Weight:      Height:        Intake/Output Summary (Last 24 hours) at 02/19/2023 0758 Last data filed at 02/19/2023 0500 Gross per 24 hour  Intake 100 ml  Output --  Net 100 ml   Filed Weights   02/15/23 2049 02/16/23 0432 02/17/23 0504  Weight: 51.5 kg 51.5 kg 52.2 kg    Examination:  General:  Pleasantly resting in bed, No acute distress. HEENT: Dry mucous membranes with poor dentition, hirsutism, enlarged frontal cranium. Neck:  Without mass or deformity.  Trachea is midline. Lungs:  Clear to auscultate bilaterally without rhonchi, wheeze, or rales. Heart:  Regular rate and rhythm.  Without murmurs, rubs, or gallops. Abdomen:  Soft, nontender, nondistended.  Without guarding or rebound. Extremities: Without cyanosis, clubbing, edema, or obvious deformity. Skin:  Warm and dry, no erythema.  Data Reviewed: I have personally reviewed following labs and imaging studies  CBC: Recent Labs  Lab 02/14/23 1531 02/15/23 0423 02/15/23 2041  WBC 8.2 5.9 5.5  NEUTROABS  --  3.5  --   HGB 12.6 11.4* 12.8  HCT 38.0 34.2* 39.3  MCV 88.0 88.4 88.7  PLT 263 251 288   Basic Metabolic Panel: Recent Labs  Lab 02/14/23 1531 02/15/23 0640 02/15/23 2041 02/16/23 0532 02/17/23 0517  NA 133* 138 135 133* 133*  K 3.1* 3.2* 2.7* 4.0 3.6  CL 96* 104 98 102 99  CO2 26 26 24 22  20*  GLUCOSE 117* 85 98 98 75  BUN 10 7* 7* 7* 7*  CREATININE 0.59 0.45 0.58 0.56 0.57  CALCIUM 8.7* 8.3* 8.0* 7.9* 8.3*  MG  --  1.5* 1.9 2.0 1.6*   GFR: Estimated Creatinine Clearance: 46.6 mL/min (by C-G formula based on SCr of 0.57 mg/dL). Liver Function Tests: Recent Labs  Lab 02/14/23 1531 02/15/23 0640 02/15/23 2041  AST 18 16 24   ALT 16 14 14   ALKPHOS 77 65 72  BILITOT 0.9 0.9 1.0  PROT 7.2 6.7 7.4  ALBUMIN 3.3* 3.0* 3.3*   No results for input(s): "LIPASE", "AMYLASE" in the last 168 hours. No results for input(s): "AMMONIA" in the last 168  hours. Coagulation Profile: No results for input(s): "INR", "PROTIME" in the last 168 hours. Cardiac Enzymes: No results for input(s): "CKTOTAL", "CKMB", "CKMBINDEX", "TROPONINI" in the last 168 hours. BNP (last 3 results) No results for input(s): "PROBNP" in the last 8760 hours. HbA1C: No results for input(s): "HGBA1C" in the last 72 hours. CBG: Recent Labs  Lab 02/18/23 1712 02/18/23 2116 02/19/23 0612 02/19/23 0638 02/19/23 0704  GLUCAP 75 72 68* 69* 90   Lipid Profile: Recent Labs    02/18/23 0304  CHOL 123  HDL 35*  LDLCALC 76  TRIG 62  CHOLHDL 3.5   Thyroid Function Tests: No results for input(s): "TSH", "T4TOTAL", "FREET4", "T3FREE", "THYROIDAB" in the last 72 hours. Anemia Panel: No results for input(s): "VITAMINB12", "FOLATE", "FERRITIN", "TIBC", "IRON", "RETICCTPCT" in the last 72 hours. Sepsis Labs: Recent Labs  Lab 02/15/23 2041 02/15/23 2350  LATICACIDVEN 4.5* 0.8    Recent Results (from the past 240 hour(s))  Urine Culture     Status: Abnormal (Preliminary result)   Collection Time: 02/14/23  9:37 PM   Specimen: Urine, Random  Result Value Ref Range Status  Specimen Description   Final    URINE, RANDOM Performed at [facility], [address], [place], [place] [address]    Special Requests URINE, RANDOM  Final   Culture (A)  Final    >=100,000 COLONIES/mL ESCHERICHIA COLI CULTURE REINCUBATED FOR BETTER GROWTH Performed at [facility], [address], [place], [place] [address]    Report Status PENDING  Incomplete   Organism ID, Bacteria ESCHERICHIA COLI (A)  Final      Susceptibility   Escherichia coli - MIC*    AMPICILLIN 4 SENSITIVE Sensitive     CEFAZOLIN <=4 SENSITIVE Sensitive     CEFEPIME <=0.12 SENSITIVE Sensitive     CEFTRIAXONE <=0.25 SENSITIVE Sensitive     CIPROFLOXACIN <=0.25 SENSITIVE Sensitive     GENTAMICIN <=1 SENSITIVE Sensitive     IMIPENEM <=0.25 SENSITIVE Sensitive     NITROFURANTOIN <=16 SENSITIVE Sensitive      TRIMETH/SULFA <=20 SENSITIVE Sensitive     AMPICILLIN/SULBACTAM <=2 SENSITIVE Sensitive     PIP/TAZO <=4 SENSITIVE Sensitive     * >=100,000 COLONIES/mL ESCHERICHIA COLI  MRSA Next Gen by PCR, Nasal     Status: None   Collection Time: 02/15/23  8:51 PM   Specimen: Nasal Mucosa; Nasal Swab  Result Value Ref Range Status   MRSA by PCR Next Gen NOT DETECTED NOT DETECTED Final    Comment: (NOTE) The GeneXpert MRSA Assay (FDA approved for NASAL specimens only), is one component of a comprehensive MRSA colonization surveillance program. It is not intended to diagnose MRSA infection nor to guide or monitor treatment for MRSA infections. Test performance is not FDA approved in patients less than 25 years old. Performed at [facility], [address], [place], [place] [address]          Radiology Studies: Overnight EEG with video  Result Date: 02/18/2023 Charlsie Quest, MD     02/18/2023  1:44 PM Patient Name: Leah Payne MRN: [id number] Epilepsy Attending: Charlsie Quest Referring Physician/Provider: Charlsie Quest, MD Duration: 02/18/2023 0104 to 1331  Patient history: 77 year old female with history of seizures. Overnight patient had 3 generalized tonic-clonic seizures, not back to baseline. EEG to evaluate for seizure  Level of alertness: Awake, asleep  AEDs during EEG study: LEV  Technical aspects: This EEG study was done with scalp electrodes positioned according to the 10-20 International system of electrode placement. Electrical activity was reviewed with band pass filter of 1-70Hz , sensitivity of 7 uV/mm, display speed of 30mm/sec with a 60Hz  notched filter applied as appropriate. EEG data were recorded continuously and digitally stored.  Video monitoring was available and reviewed as appropriate.  Description: The posterior dominant rhythm consists of 7 Hz activity of moderate voltage (25-35 uV) seen predominantly in posterior head regions, symmetric and reactive to eye  opening and eye closing. Sleep was characterized by sleep spindles (12-14hz ), maximal fronto-central region. EEG showed continuous 2-3hz  delta slowing in left hemisphere, maximal bifrontal region. Abundant sharp waves were noted in left hemisphere, maximal left temporal region, qasi periodic at 0.5hz . Hyperventilation and photic stimulation were not performed.  Of note, study was technically difficult due to significant movement and electrode artifact.    ABNORMALITY - Sharp waves, left hemisphere, maximal left temporal region - Continuous slow, left hemisphere, maximal bifrontal region - Background slow  IMPRESSION: This technically difficult study showed evidence of epileptogenicity arising from left hemisphere, maximal left temporal region. Additionally there is cortical dysfunction arising from left hemisphere and bifrontal region  likely secondary to underlying structural abnormality. Lastly there is mild diffuse encephalopathy. No seizures were seen throughout the recording.   Priyanka Annabelle Harman       Scheduled Meds:  aspirin  81 mg Oral Daily   atorvastatin  40 mg Oral Daily   clopidogrel  75 mg Oral Daily   donepezil  10 mg Oral QHS   fesoterodine  8 mg Oral Daily   heparin  5,000 Units Subcutaneous Q8H   insulin aspart  0-9 Units Subcutaneous TID WC   levETIRAcetam  750 mg Oral BID   levothyroxine  25 mcg Oral Q0600   mirabegron ER  50 mg Oral Daily   sertraline  100 mg Oral QHS   [START ON 02/26/2023] thiamine (VITAMIN B1) injection  100 mg Intravenous Daily   vitamin B-12  500 mcg Oral Daily   Continuous Infusions:  levETIRAcetam     thiamine (VITAMIN B1) injection 500 mg (02/19/23 0545)   Followed by   Melene Muller ON 02/20/2023] thiamine (VITAMIN B1) injection       LOS: 3 days   Time spent:  Azucena Fallen, DO [facility]  If 7PM-7AM, please contact night-coverage [email or web address]  02/19/2023, 7:58 AM

## 2023-02-20 DIAGNOSIS — N3281 Overactive bladder: Secondary | ICD-10-CM | POA: Diagnosis not present

## 2023-02-20 DIAGNOSIS — E11649 Type 2 diabetes mellitus with hypoglycemia without coma: Secondary | ICD-10-CM | POA: Diagnosis not present

## 2023-02-20 DIAGNOSIS — E039 Hypothyroidism, unspecified: Secondary | ICD-10-CM | POA: Diagnosis not present

## 2023-02-20 DIAGNOSIS — E785 Hyperlipidemia, unspecified: Secondary | ICD-10-CM | POA: Diagnosis not present

## 2023-02-20 DIAGNOSIS — I6350 Cerebral infarction due to unspecified occlusion or stenosis of unspecified cerebral artery: Secondary | ICD-10-CM | POA: Diagnosis not present

## 2023-02-20 DIAGNOSIS — E876 Hypokalemia: Secondary | ICD-10-CM | POA: Diagnosis not present

## 2023-02-20 DIAGNOSIS — D649 Anemia, unspecified: Secondary | ICD-10-CM | POA: Diagnosis not present

## 2023-02-20 DIAGNOSIS — I69398 Other sequelae of cerebral infarction: Secondary | ICD-10-CM | POA: Diagnosis not present

## 2023-02-20 DIAGNOSIS — A419 Sepsis, unspecified organism: Secondary | ICD-10-CM | POA: Diagnosis not present

## 2023-02-20 DIAGNOSIS — E119 Type 2 diabetes mellitus without complications: Secondary | ICD-10-CM | POA: Diagnosis not present

## 2023-02-20 DIAGNOSIS — R531 Weakness: Secondary | ICD-10-CM | POA: Diagnosis not present

## 2023-02-20 DIAGNOSIS — I456 Pre-excitation syndrome: Secondary | ICD-10-CM | POA: Diagnosis not present

## 2023-02-20 DIAGNOSIS — E782 Mixed hyperlipidemia: Secondary | ICD-10-CM | POA: Diagnosis not present

## 2023-02-20 DIAGNOSIS — G459 Transient cerebral ischemic attack, unspecified: Secondary | ICD-10-CM | POA: Diagnosis not present

## 2023-02-20 DIAGNOSIS — Z7189 Other specified counseling: Secondary | ICD-10-CM | POA: Diagnosis not present

## 2023-02-20 DIAGNOSIS — R569 Unspecified convulsions: Secondary | ICD-10-CM | POA: Diagnosis not present

## 2023-02-20 DIAGNOSIS — N390 Urinary tract infection, site not specified: Secondary | ICD-10-CM | POA: Diagnosis not present

## 2023-02-20 DIAGNOSIS — Z7401 Bed confinement status: Secondary | ICD-10-CM | POA: Diagnosis not present

## 2023-02-20 DIAGNOSIS — E559 Vitamin D deficiency, unspecified: Secondary | ICD-10-CM | POA: Diagnosis not present

## 2023-02-20 DIAGNOSIS — Z79899 Other long term (current) drug therapy: Secondary | ICD-10-CM | POA: Diagnosis not present

## 2023-02-20 DIAGNOSIS — R6520 Severe sepsis without septic shock: Secondary | ICD-10-CM | POA: Diagnosis not present

## 2023-02-20 LAB — GLUCOSE, CAPILLARY
Glucose-Capillary: 78 mg/dL (ref 70–99)
Glucose-Capillary: 76 mg/dL (ref 70–99)
Glucose-Capillary: 78 mg/dL (ref 70–99)
Glucose-Capillary: 77 mg/dL (ref 70–99)

## 2023-02-20 MED ORDER — ASPIRIN 81 MG PO CHEW: 81.0000 mg | CHEWABLE_TABLET | Freq: Every day | ORAL | 0 refills | Status: DC

## 2023-02-20 MED ORDER — CLOPIDOGREL BISULFATE 75 MG PO TABS: 75.0000 mg | ORAL_TABLET | Freq: Every day | ORAL | 0 refills | Status: DC

## 2023-02-20 MED ORDER — LEVETIRACETAM 750 MG PO TABS: 750.0000 mg | ORAL_TABLET | Freq: Two times a day (BID) | ORAL | 0 refills | Status: AC

## 2023-02-20 NOTE — Evaluation (Signed)
Physical Therapy Evaluation Patient Details Name: Leah Payne MRN: [id number] DOB: September 25, 1945 Today's Date: 02/20/2023  History of Present Illness  77 y.o. female presents to [facility] hospital on 02/16/2023 as a transfer from [facility]. Pt presented with dizziness and gait instability. 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 Pt also with persistent hydrocephalus and experienced multiple seizures at [facility] prior to transfer to [facility] hospital. Pt also noted to have UTI. PMH includes dementia, DMII, HLD, seizure, hydrocephalus, hypothyroidism.  Clinical Impression  Pt presents to PT with presumed deficits in functional mobility, balance, strength, power, cognition, tone. Pt is an unreliable historian, only oriented to person. Pt demonstrates very poor initiation and slowed processing, requires physical assistance to initiate all mobility at this time and remains at a high risk for falls despite pt's insistence that she ambulates at baseline. Pt also reports she lives with her spouse, although all reports indicate pt lives at [facility]. PT  recommends return to SNF when medically appropriate. Pt may benefit from post-acute PT services if mobility is below baseline level, however this is difficult to determine at this time as no caregivers are present to confirm.      If plan is discharge home, recommend the following: Two people to help with walking and/or transfers;Two people to help with bathing/dressing/bathroom;Assistance with cooking/housework;Assistance with feeding;Direct supervision/assist for financial management;Direct supervision/assist for medications management;Assist for transportation;Help with stairs or ramp for entrance   Can travel by private vehicle   No    Equipment Recommendations  (defer to post-acute setting)  Recommendations for Other Services       Functional Status Assessment Patient has had a recent decline in their functional status and demonstrates the ability to make  significant improvements in function in a reasonable and predictable amount of time.     Precautions / Restrictions Precautions Precautions: Fall Restrictions Weight Bearing Restrictions: No      Mobility  Bed Mobility Overal bed mobility: Needs Assistance Bed Mobility: Supine to Sit, Sit to Supine     Supine to sit: Max assist, HOB elevated Sit to supine: Total assist        Transfers Overall transfer level: Needs assistance Equipment used: 1 person hand held assist Transfers: Sit to/from Stand Sit to Stand: Total assist           General transfer comment: strong posterior lean, pt does not follow cues to widen BOS or increase knee flexion pre-transfer. Pt does not initiate sit to stand and requires totalA to lift into standing    Ambulation/Gait                  Stairs            Wheelchair Mobility     Tilt Bed    Modified Rankin (Stroke Patients Only) Modified Rankin (Stroke Patients Only) Pre-Morbid Rankin Score:  (unsure of baseline) Modified Rankin: Severe disability     Balance Overall balance assessment: Needs assistance Sitting-balance support: Single extremity supported, Feet supported Sitting balance-Leahy Scale: Poor   Postural control: Left lateral lean   Standing balance-Leahy Scale: Zero Standing balance comment: totalA                             Pertinent Vitals/Pain Pain Assessment Pain Assessment: PAINAD Breathing: normal Negative Vocalization: none Facial Expression: smiling or inexpressive Body Language: relaxed Consolability: no need to console PAINAD Score: 0 Pain Intervention(s): Monitored during session  Home Living Family/patient expects to be discharged to:: Skilled nursing facility                 Home Equipment: Rolling Walker (2 wheels);Rollator (4 wheels) Additional Comments: pt is unable to provide a history due to communication and cognitive deficits    Prior Function  Prior Level of Function : Patient poor historian/Family not available             Mobility Comments: pt reports ambulating at baseline however she is a very limited historian and significantly altered at this time       Hand Dominance        Extremity/Trunk Assessment   Upper Extremity Assessment Upper Extremity Assessment: Generalized weakness;RUE deficits/detail;Difficult to assess due to impaired cognition RUE Deficits / Details: 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, 2 thumbs    Lower Extremity Assessment Lower Extremity Assessment: Generalized weakness;Difficult to assess due to impaired cognition (extensor tone noted in RLE, bilateral adductor tone)    Cervical / Trunk Assessment Cervical / Trunk Assessment: Kyphotic  Communication   Communication: Receptive difficulties;Expressive difficulties  Cognition Arousal/Alertness: Awake/alert Behavior During Therapy: Flat affect Overall Cognitive Status: History of cognitive impairments - at baseline                                 General Comments: pt is oriented to person, inconsistently following commands, very slowed processing and impaired initiation        General Comments General comments (skin integrity, edema, etc.): VSS on RA    Exercises     Assessment/Plan    PT Assessment Patient needs continued PT services  PT Problem List Decreased strength;Decreased activity tolerance;Decreased balance;Decreased mobility;Decreased coordination;Decreased cognition;Decreased knowledge of use of DME;Decreased safety awareness;Decreased knowledge of precautions;Impaired tone       PT Treatment Interventions Functional mobility training;Therapeutic activities;Therapeutic exercise;Neuromuscular re-education;Balance training;DME instruction;Gait training;Cognitive remediation;Patient/family education;Wheelchair mobility training    PT Goals (Current goals can be found in the Care Plan section)  Acute Rehab  PT Goals Patient Stated Goal: pt unable to state, PT goal to reduce assistance in transfers PT Goal Formulation: Patient unable to participate in goal setting Time For Goal Achievement: 03/06/23 Potential to Achieve Goals: Poor    Frequency Min 1X/week     Co-evaluation               AM-PAC PT "6 Clicks" Mobility  Outcome Measure Help needed turning from your back to your side while in a flat bed without using bedrails?: A Lot Help needed moving from lying on your back to sitting on the side of a flat bed without using bedrails?: A Lot Help needed moving to and from a bed to a chair (including a wheelchair)?: Total Help needed standing up from a chair using your arms (e.g., wheelchair or bedside chair)?: Total Help needed to walk in hospital room?: Total Help needed climbing 3-5 steps with a railing? : Total 6 Click Score: 8    End of Session Equipment Utilized During Treatment: Gait belt Activity Tolerance: Patient tolerated treatment well Patient left: in bed;with call bell/phone within reach;with bed alarm set Nurse Communication: Mobility status;Need for lift equipment PT Visit Diagnosis: Other abnormalities of gait and mobility (R26.89);Muscle weakness (generalized) (M62.81);Other symptoms and signs involving the nervous system (R29.898)    Time: 4332-9518 PT Time Calculation (min) (ACUTE ONLY): 17 min   Charges:   PT Evaluation $PT Eval  Low Complexity: 1 Low   PT General Charges $$ ACUTE PT VISIT: 1 Visit         Arlyss Gandy, PT, DPT Acute Rehabilitation Office [phone]   Arlyss Gandy 02/20/2023, 9:41 AM

## 2023-02-20 NOTE — TOC Transition Note (Signed)
Transition of Care [facility]) - CM/SW Discharge Note   Patient Details  Name: Leah Payne MRN: [id number] Date of Birth: [birth year]  [facility] CM/SW Contact:  Baldemar Lenis, LCSW Phone Number: 02/20/2023, 1:39 PM   Clinical Narrative:   CSW confirmed with [facility] that [facility] is still approved for today and bed is available. CSW updated MD, sent discharge summary to [facility]. CSW spoke with spouse, [name], to update on discharge to SNF today and he is in agreement. [name] asked CSW to contact owner of patient's ALF, and CSW updated Roddie Mc with patient's rehab location. Transport arranged with [facility] for next available.  Nurse to call report to [phone], Room 209A    Final next level of care: Skilled Nursing Facility Barriers to Discharge: Barriers Resolved   Patient Goals and CMS Choice CMS Medicare.gov Compare Post Acute Care list provided to:: Patient Represenative (must comment) Choice offered to / list presented to : Spouse  Discharge Placement                Patient chooses bed at: [facility] Patient to be transferred to facility by: [facility] Name of family member notified: [name] Patient and family notified of of transfer: 02/20/23  Discharge Plan and Services Additional resources added to the After Visit Summary for                                       Social Determinants of Health (SDOH) Interventions SDOH Screenings   Food Insecurity: No Food Insecurity (02/15/2023)  Housing: Patient Declined (02/15/2023)  Transportation Needs: Unknown (02/15/2023)  Utilities: Not At Risk (02/15/2023)  Alcohol Screen: Low Risk  (07/06/2021)  Depression (PHQ2-9): Low Risk  (07/06/2021)  Financial Resource Strain: Low Risk  (07/06/2021)  Physical Activity: Inactive (07/06/2021)  Social Connections: Socially Integrated (07/06/2021)  Stress: No Stress Concern Present (07/06/2021)  Tobacco Use: Low Risk  (02/19/2023)     Readmission  Risk Interventions     No data to display

## 2023-02-20 NOTE — Care Management Important Message (Signed)
Important Message  Patient Details  Name: Leah Payne MRN: [id number] Date of Birth: 06/29/1946   Medicare Important Message Given:  Yes     Dorena Bodo 02/20/2023, 4:09 PM

## 2023-02-20 NOTE — Discharge Summary (Signed)
Physician Discharge Summary  ARLEN BAKARE ZOX:[id number] DOB: 09/26/1945 DOA: 02/14/2023  PCP: Tommie Sams, DO  Admit date: 02/14/2023 Discharge date: 02/20/2023  Admitted From: Facility Disposition: Same  Recommendations for Outpatient Follow-up:  Follow up with PCP in 1-2 weeks  Discharge Condition: Stable CODE STATUS: DNR Diet recommendation: Regular diet as tolerated, liberalize diet given hypoglycemia requiring discontinuation of metformin  Brief/Interim Summary: 77 year old female with a history of dementia, diabetes mellitus type 2, hyperlipidemia, seizure disorder, hydrocephalus, hypothyroidism, WPW presenting with dizziness and gait instability(normally independent with ambulation).  The patient resides at [facility].  Imaging in the ED showed acute infarct with persistent severe dilation of the third and lateral ventricles, ultimately noted to have seizure 7/24 evening with recurrent seizure 10 minutes later.  Hospitalist called for admission, neurology called in consult.   Patient admitted as above with acute ischemic stroke followed by neurology, confirmed on MRI.  Concern for acute breakthrough seizure in the interim questionably triggered by stroke, Keppra increased per neurology.  Patient had notable episodes of hypoglycemia despite being off metformin, liberalize diet A1c 5.6 confirming tight control.  Patient continued to improve in both mentation and physical activity, approaching baseline otherwise safe for discharge back to prior facility living arrangement.  Discharge Diagnoses:  Principal Problem:   Generalized weakness Active Problems:   Seizure disorder (HCC)   Hyperlipidemia associated with type 2 diabetes mellitus (HCC)   UTI (urinary tract infection)   Type 2 diabetes, controlled, 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HCC)   Hypothyroidism   Dizziness   Hypokalemia   Gait instability   Major neurocognitive disorder (HCC)   Acute ischemic stroke  (HCC)  Acute ischemic stroke -MRI brain--acute infarct in the right middle frontal gyrus. 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 coli positive, POA, resolved -Tachycardic tachypneic with mental status changes concerning for severe sepsis, source UTI -Completed antibiotic course   Acute breakthrough seizure, resolved -on keppra at home -increased dose 750 twice daily -Patient had 3 seizures on the evening of 02/15/2023 -Patient much more awake alert this morning, neurology following -EEG completed per neurology, no further episodes or requirement for further testing   Hypothyroidism -Continue levothyroxine   Mixed hyperlipidemia -Continue statin   Controlled diabetes mellitus type 2 -NovoLog sliding scale -Hemoglobin A1c -- 5.6 -Hypoglycemic events initially in setting of poor p.o. intake, will liberalize diet to improve p.o. intake, patient has not received sliding scale insulin now in over 24 hours discontinuing metformin at discharge   Major neurocognitive disorder -Continue Aricept   Hypokalemia/hypomagnesemia -Repleting Stable, continue p.o. intake as appropriate  Discharge Instructions  Discharge Instructions     Ambulatory referral to Neurology   Complete by: As directed    An appointment is requested in approximately: 8 weeks       Allergies as of 02/20/2023   No Known Allergies      Medication List     STOP taking these medications    metFORMIN 500  MG tablet Commonly known as: GLUCOPHAGE   propranolol 10 MG tablet Commonly known as: INDERAL       TAKE these medications    Accu-Chek FastClix Lancets Misc Use as instructed to check blood sugar once daily   Accu-Chek Guide test strip Generic drug: glucose blood USE TO CHECK BLOOD SUGAR ONCE DAILY.   Accu-Chek Guide w/Device Kit 1 each by Does not apply route as directed. Use as instructed to check blood sugar once daily   aspirin 81 MG chewable tablet Chew 1 tablet (81 mg total) by mouth daily. Start taking on: February 21, 2023   atorvastatin 40 MG tablet Commonly known as: LIPITOR TAKE (1) TABLET BY MOUTH EVERY EVENING. What changed: See the new instructions.   B-D SINGLE USE SWABS REGULAR Pads Use as instructed to check blood sugar once daily   clopidogrel 75 MG tablet Commonly known as: PLAVIX Take 1 tablet (75 mg total) by mouth daily. Start taking on: February 21, 2023   donepezil 10 MG tablet Commonly known as: ARICEPT TAKE (1) TABLET BY MOUTH EVERY MORNING. What changed: See the new instructions.   levETIRAcetam 750 MG tablet Commonly known as: KEPPRA Take 1 tablet (750 mg total) by mouth 2 (two) times daily. What changed:  medication strength how much to take how to take this when to take this additional instructions   levothyroxine 25 MCG tablet Commonly known as: SYNTHROID TAKE (1) TABLET BY MOUTH ONCE DAILY BEFORE BREAKFAST. What changed: See the new instructions.   Melatonin 10 MG Tabs Take 5 mg by mouth at bedtime.   [name] 50 MG Tb24 tablet Generic drug: mirabegron ER TAKE (1) TABLET BY MOUTH AT BEDTIME. What changed: See the new instructions.   sertraline 100 MG tablet Commonly known as: ZOLOFT TAKE (1) TABLET BY MOUTH EVERY MORNING. What changed: See the new instructions.   [name] 8 MG Tb24  tablet Generic drug: fesoterodine TAKE (1) TABLET BY MOUTH ONCE DAILY. What changed: See the new instructions.   Vitamin D 50 MCG (2000 UT) Caps Take 1 capsule (2,000 Units total) by mouth daily.        Contact information for follow-up providers     Schedule an appointment as soon as possible for a visit  with Tommie Sams, DO.   Specialty: Family Medicine Contact information: [address] [place] [address] [phone]              Contact information for after-discharge care     Destination     [facility] .   Service: Skilled Nursing Contact information: [address] [place] [address] [phone]                    No Known Allergies  Consultations: Neurology  Procedures/Studies: Overnight EEG with video  Result Date: 02/18/2023 Charlsie Quest, MD     02/18/2023  1:44 PM Patient Name: Leah Payne MRN: [id number] Epilepsy Attending: Charlsie Quest Referring Physician/Provider: Charlsie Quest, MD Duration: 02/18/2023 0104 to 1331  Patient history: 77 year old female with history of seizures. Overnight patient had 3 generalized tonic-clonic seizures, not back to baseline. EEG to evaluate for seizure  Level of alertness: Awake, asleep  AEDs during EEG study: LEV  Technical aspects: This EEG study was done with scalp electrodes positioned according to the 10-20 International system of electrode placement. Electrical activity was reviewed with band pass filter of 1-70Hz , sensitivity of 7 uV/mm, display speed of  72mm/sec with a 60Hz  notched filter applied as appropriate. EEG data were recorded continuously and digitally stored.  Video monitoring was available and reviewed as appropriate.  Description: The posterior dominant rhythm consists of 7 Hz activity of moderate voltage (25-35 uV) seen predominantly in posterior head regions, symmetric and reactive to eye opening and eye closing. Sleep was characterized by  sleep spindles (12-14hz ), maximal fronto-central region. EEG showed continuous 2-3hz  delta slowing in left hemisphere, maximal bifrontal region. Abundant sharp waves were noted in left hemisphere, maximal left temporal region, qasi periodic at 0.5hz . Hyperventilation and photic stimulation were not performed.  Of note, study was technically difficult due to significant movement and electrode artifact.    ABNORMALITY - Sharp waves, left hemisphere, maximal left temporal region - Continuous slow, left hemisphere, maximal bifrontal region - Background slow  IMPRESSION: This technically difficult study showed evidence of epileptogenicity arising from left hemisphere, maximal left temporal region. Additionally there is cortical dysfunction arising from left hemisphere and bifrontal region likely secondary to underlying structural abnormality. Lastly there is mild diffuse encephalopathy. No seizures were seen throughout the recording.   Charlsie Quest  EEG adult  Result Date: 02/16/2023 Charlsie Quest, MD     02/18/2023  7:05 AM Patient Name: AVALIA SLONECKER MRN: [id number] Epilepsy Attending: Charlsie Quest Referring Physician/Provider: Catarina Hartshorn, MD Date: 02/16/2023 Duration: 28.32 mins Patient history: 77 year old female with history of seizures. Overnight patient had 3 generalized tonic-clonic seizures, not back to baseline. EEG to evaluate for seizure Level of alertness: Awake AEDs during EEG study: LEV Technical aspects: This EEG study was done with scalp electrodes positioned according to the 10-20 International system of electrode placement. Electrical activity was reviewed with band pass filter of 1-70Hz , sensitivity of 7 uV/mm, display speed of 42mm/sec with a 60Hz  notched filter applied as appropriate. EEG data were recorded continuously and digitally stored.  Video monitoring was available and reviewed as appropriate. Description: The posterior dominant rhythm consists of 7.5 Hz activity of moderate  voltage (25-35 uV) seen predominantly in posterior head regions, symmetric and reactive to eye opening and eye closing. EEG showed continuous 2-3hz  delta slowing admixed with 13-15hz  beta activity in bifrontal region. Sharp transients were noted in left hemisphere, maximal left temporal region, qasi- periodic at 0.5-1Hz . These discharges could be epileptiform but difficult to be certain due to significant electrode and movement artifact. Hyperventilation and photic stimulation were not performed. Of note, study was technically difficult due to significant movement and electrode artifact.   ABNORMALITY - Continuous slow, bifrontal region IMPRESSION: This technically difficult study is suggestive of cortical dysfunction arising from bifrontal region likely secondary to underlying structural abnormality. No seizures were seen throughout the recording. Sharp transients were noted in left hemisphere, maximal left temporal region. These discharges could be epileptiform but difficult to be certain due to significant electrode and movement artifact. Recommend repeating EEG if concern for interictal activity persists. Charlsie Quest   MR ANGIO HEAD WO CONTRAST  Result Date: 02/15/2023 CLINICAL DATA:  Follow-up examination for stroke. EXAM: MRA NECK WITHOUT CONTRAST MRA HEAD WITHOUT CONTRAST TECHNIQUE: Angiographic images of the Circle of Willis were acquired using MRA technique without intravenous contrast. COMPARISON:  None Available. FINDINGS: MRA NECK FINDINGS Aortic arch: Examination technically limited by motion and lack of IV contrast. Visualized aortic arch normal caliber. Bovine branching pattern noted at the aortic arch. No visible stenosis about the origin the great vessels. Right carotid system: Right common and internal carotid arteries are patent  with antegrade flow. No evidence for dissection. Atheromatous irregularity about the right carotid bulb without hemodynamically significant greater than 50%  stenosis. Left carotid system: Left common and internal carotid arteries are tortuous but patent with antegrade flow. No evidence for dissection. Atheromatous irregularity about the left carotid artery system, but without convincing hemodynamically significant greater than 50% stenosis. Vertebral arteries: Right vertebral artery arises from the right subclavian artery. Origin of the left vertebral artery not well assessed on this limited exam. Right vertebral artery dominant. Vertebral arteries are patent with antegrade flow. No evidence for dissection. Mild atheromatous irregularity without visible hemodynamically significant stenosis. Other: None. MRA HEAD FINDINGS Anterior circulation: Visualized internal carotid arteries are patent through the siphons without stenosis. A1 segments patent bilaterally. Left A1 hypoplastic, accounting for the diminutive left ICA is compared to the right. Evaluation of the anterior communicating artery complex limited by motion. No obvious abnormality. Anterior cerebral arteries patent without visible stenosis. Focal severe proximal right M1 stenosis (series [id number], image 9). Atheromatous irregularity seen distally within the right MCA, within additional focal moderate to severe proximal right M2 stenosis (series [id number], image 12). Right MCA branches are patent distally. Left M1 segment and distal left MCA branches widely patent and well perfused. Posterior circulation: Neither intradural V4 segment visualized on the images provided. Visualized mid in distal vertebral artery patent with antegrade flow. Superior cerebral arteries patent bilaterally. Left PCA supplied via the basilar. Fetal type origin of the right PCA. Probable severe proximal right PCA stenosis (series 63016, image 1). PCAs otherwise right PCA attenuated but grossly patent distally on time-of-flight sequence. Left PCA patent without visible stenosis. Anatomic variants: As above. Other: No intracranial aneurysm.  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 IMPRESSION: MRA HEAD IMPRESSION: 1. Negative intracranial MRA for large vessel occlusion. 2. 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 3. Otherwise wide patency of the major intracranial arterial circulation. MRA NECK IMPRESSION: 1. Atheromatous irregularity about the carotid bifurcations without hemodynamically significant greater than 50% stenosis. 2. Patent antegrade flow within both vertebral arteries within the neck. Right vertebral artery dominant. 3. Tortuosity of the major arterial vasculature of the neck, suggesting chronic underlying hypertension. Electronically Signed   By: Rise Mu M.D.   On: 02/15/2023 17:52   MR ANGIO NECK WO CONTRAST  Result Date: 02/15/2023 CLINICAL DATA:  Follow-up examination for stroke. EXAM: MRA NECK WITHOUT CONTRAST MRA HEAD WITHOUT CONTRAST TECHNIQUE: Angiographic images of the Circle of Willis were acquired using MRA technique without intravenous contrast. COMPARISON:  None Available. FINDINGS: MRA NECK FINDINGS Aortic arch: Examination technically limited by motion and lack of IV contrast. Visualized aortic arch normal caliber. Bovine branching pattern noted at the aortic arch. No visible stenosis about the origin the great vessels. Right carotid system: Right common and internal carotid arteries are patent with antegrade flow. No evidence for dissection. Atheromatous irregularity about the right carotid bulb without hemodynamically significant greater than 50% stenosis. Left carotid system: Left common and internal carotid arteries are tortuous but patent with antegrade flow. No evidence for dissection. Atheromatous irregularity about the left carotid artery system, but without convincing hemodynamically significant greater than 50% stenosis. Vertebral  arteries: Right vertebral artery arises from the right subclavian artery. Origin of the left vertebral artery not well assessed on this limited exam. Right vertebral artery dominant. Vertebral arteries are patent with antegrade flow. No evidence for dissection. Mild atheromatous  irregularity without visible hemodynamically significant stenosis. Other: None. MRA HEAD FINDINGS Anterior circulation: Visualized internal carotid arteries are patent through the siphons without stenosis. A1 segments patent bilaterally. Left A1 hypoplastic, accounting for the diminutive left ICA is compared to the right. Evaluation of the anterior communicating artery complex limited by motion. No obvious abnormality. Anterior cerebral arteries patent without visible stenosis. Focal severe proximal right M1 stenosis (series 40981, image 9). Atheromatous irregularity seen distally within the right MCA, within additional focal moderate to severe proximal right M2 stenosis (series [id number], image 12). Right MCA branches are patent distally. Left M1 segment and distal left MCA branches widely patent and well perfused. Posterior circulation: Neither intradural V4 segment visualized on the images provided. Visualized mid in distal vertebral artery patent with antegrade flow. Superior cerebral arteries patent bilaterally. Left PCA supplied via the basilar. Fetal type origin of the right PCA. Probable severe proximal right PCA stenosis (series 82956, image 1). PCAs otherwise right PCA attenuated but grossly patent distally on time-of-flight sequence. Left PCA patent without visible stenosis. Anatomic variants: As above. Other: No intracranial aneurysm. 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 IMPRESSION: MRA HEAD IMPRESSION: 1. Negative intracranial MRA for large vessel occlusion. 2. Intracranial atherosclerotic disease with resultant moderate to severe proximal right M1 and M2 stenoses, within  additional severe proximal right PCA stenosis. 3. Otherwise wide patency of the major intracranial arterial circulation. MRA NECK IMPRESSION: 1. Atheromatous irregularity about the carotid bifurcations without hemodynamically significant greater than 50% stenosis. 2. Patent antegrade flow within both vertebral arteries within the neck. Right vertebral artery dominant. 3. Tortuosity of the major arterial vasculature of the neck, suggesting chronic underlying hypertension. Electronically Signed   By: Rise Mu M.D.   On: 02/15/2023 17:52   ECHOCARDIOGRAM COMPLETE  Result Date: 02/15/2023    ECHOCARDIOGRAM REPORT   Patient Name:   PRAPTI FLOW Date of Exam: 02/15/2023 Medical Rec #:  [id number]         Height:       62.0 in Accession #:    [id number]        Weight:       129.0 lb Date of Birth:  01-22-1946         BSA:          1.587 m Patient Age:    76 years          BP:           171/96 mmHg Patient Gender: F                 HR:           85 bpm. [facility] Procedure: 2D Echo, Cardiac Doppler and Color Doppler Indications:    R06.9 DOE  History:        Patient has no prior history of Echocardiogram examinations.                 Signs/Symptoms:Dyspnea; Risk Factors:Hypertension and                 Dyslipidemia.  Sonographer:    Dondra Prader RVT RCS Referring Phys: [phone] DAVID TAT  Sonographer Comments: Technically challenging study due to limited acoustic windows, Technically difficult study due to poor echo windows and suboptimal parasternal window. IMPRESSIONS  1. Left ventricular ejection fraction, by estimation, is 55 to 60%. The left ventricle has normal function. Left ventricular endocardial border not optimally defined to evaluate regional wall  motion. Left ventricular diastolic parameters are consistent with Grade I diastolic dysfunction (impaired relaxation).  2. Right ventricular systolic function is normal. The right ventricular size is normal.  3. The mitral valve was not  well visualized. No evidence of mitral valve regurgitation. No evidence of mitral stenosis.  4. The aortic valve was not well visualized. Aortic valve regurgitation is not visualized. No aortic stenosis is present.  5. The inferior vena cava is normal in size with greater than 50% respiratory variability, suggesting right atrial pressure of 3 mmHg. FINDINGS  Left Ventricle: Left ventricular ejection fraction, by estimation, is 55 to 60%. The left ventricle has normal function. Left ventricular endocardial border not optimally defined to evaluate regional wall motion. The left ventricular internal cavity size was normal in size. There is no left ventricular hypertrophy. Left ventricular diastolic parameters are consistent with Grade I diastolic dysfunction (impaired relaxation). Normal left ventricular filling pressure. Right Ventricle: The right ventricular size is normal. Right vetricular wall thickness was not well visualized. Right ventricular systolic function is normal. Left Atrium: Left atrial size was normal in size. Right Atrium: Right atrial size was normal in size. Pericardium: There is no evidence of pericardial effusion. Mitral Valve: The mitral valve was not well visualized. There is mild thickening of the mitral valve leaflet(s). There is mild calcification of the mitral valve leaflet(s). Mild mitral annular calcification. No evidence of mitral valve regurgitation. No evidence of mitral valve stenosis. Tricuspid Valve: The tricuspid valve is normal in structure. Tricuspid valve regurgitation is not demonstrated. No evidence of tricuspid stenosis. Aortic Valve: The aortic valve was not well visualized. Aortic valve regurgitation is not visualized. No aortic stenosis is present. Aortic valve mean gradient measures 4.0 mmHg. Aortic valve peak gradient measures 8.3 mmHg. Aortic valve area, by VTI measures 2.05 cm. Pulmonic Valve: The pulmonic valve was not well visualized. Pulmonic valve regurgitation is  not visualized. No evidence of pulmonic stenosis. Aorta: The aortic root is normal in size and structure. Venous: The inferior vena cava is normal in size with greater than 50% respiratory variability, suggesting right atrial pressure of 3 mmHg. IAS/Shunts: No atrial level shunt detected by color flow Doppler.  LEFT VENTRICLE PLAX 2D LVIDd:         3.50 cm   Diastology LVIDs:         2.60 cm   LV e' medial:    5.22 cm/s LV PW:         0.70 cm   LV E/e' medial:  14.5 LV IVS:        0.80 cm   LV e' lateral:   8.16 cm/s LVOT diam:     2.00 cm   LV E/e' lateral: 9.3 LV SV:         54 LV SV Index:   34 LVOT Area:     3.14 cm  RIGHT VENTRICLE             IVC RV Basal diam:  2.50 cm     IVC diam: 1.70 cm RV S prime:     13.10 cm/s TAPSE (M-mode): 1.8 cm LEFT ATRIUM             Index        RIGHT ATRIUM          Index LA diam:        3.20 cm 2.02 cm/m   RA Area:     9.66 cm LA Vol (A2C):   34.7 ml 21.87  ml/m  RA Volume:   18.60 ml 11.72 ml/m LA Vol (A4C):   32.0 ml 20.17 ml/m LA Biplane Vol: 35.5 ml 22.37 ml/m  AORTIC VALVE                    PULMONIC VALVE AV Area (Vmax):    2.06 cm     PV Vmax:       0.92 m/s AV Area (Vmean):   2.08 cm     PV Peak grad:  3.3 mmHg AV Area (VTI):     2.05 cm AV Vmax:           144.00 cm/s AV Vmean:          93.100 cm/s AV VTI:            0.262 m AV Peak Grad:      8.3 mmHg AV Mean Grad:      4.0 mmHg LVOT Vmax:         94.60 cm/s LVOT Vmean:        61.600 cm/s LVOT VTI:          0.171 m LVOT/AV VTI ratio: 0.65  AORTA Ao Root diam: 2.80 cm MITRAL VALVE MV Area (PHT): 3.81 cm    SHUNTS MV Decel Time: 199 msec    Systemic VTI:  0.17 m MV E velocity: 75.95 cm/s  Systemic Diam: 2.00 cm MV A velocity: 94.60 cm/s MV E/A ratio:  0.80 Dina Rich MD Electronically signed by Dina Rich MD Signature Date/Time: 02/15/2023/11:22:04 AM    Final    MR BRAIN WO CONTRAST  Result Date: 02/15/2023 CLINICAL DATA:  Mental status change, unknown cause. Dizziness, gait instability, chronic  hydrocephalus. EXAM: MRI HEAD WITHOUT CONTRAST TECHNIQUE: Multiplanar, multiecho pulse sequences of the brain and surrounding structures were obtained without intravenous contrast. COMPARISON:  MRI brain 09/20/2017.  Head CT 02/14/2023. FINDINGS: Brain: Acute infarct in the right middle frontal gyrus. No acute hemorrhage or significant mass effect. Background of moderate chronic small-vessel disease, similar to prior. Stable severe dilation of the third and lateral ventricles, consistent with aqueductal stenosis. Vascular: Normal flow voids. Skull and upper cervical spine: Normal marrow signal. Sinuses/Orbits: Unremarkable. Other: None. IMPRESSION: 1. Acute infarct in the right middle frontal gyrus. No acute hemorrhage or significant mass effect. 2. Stable severe dilation of the third and lateral ventricles, consistent with aqueductal stenosis. Electronically Signed   By: Orvan Falconer M.D.   On: 02/15/2023 10:04   CT Head Wo Contrast  Result Date: 02/14/2023 CLINICAL DATA:  Vertigo, central. EXAM: CT HEAD WITHOUT CONTRAST TECHNIQUE: Contiguous axial images were obtained from the base of the skull through the vertex without intravenous contrast. RADIATION DOSE REDUCTION: This exam was performed according to the departmental dose-optimization program which includes automated exposure control, adjustment of the mA and/or kV according to patient size and/or use of iterative reconstruction technique. COMPARISON:  Head CT 02/22/2018. FINDINGS: Brain: No acute hemorrhage. 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 No extra-axial collection. Vascular: No hyperdense vessel or unexpected calcification. Skull: No calvarial fracture or suspicious bone lesion. Skull base is unremarkable. Sinuses/Orbits: No acute finding. Other: None. IMPRESSION: 1. No acute intracranial abnormality. 2. 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 Electronically Signed   By: Orvan Falconer M.D.   On: 02/14/2023 16:11   DG Chest Port 1 View  Result Date: 01/24/2023 CLINICAL DATA:  Chronic cough. EXAM: PORTABLE CHEST  1 VIEW COMPARISON:  11/15/2020 FINDINGS: Lung apices obscured by the patient's face. The lungs are clear without focal pneumonia, edema, pneumothorax or pleural effusion. Cardiopericardial silhouette is at upper limits of normal for size. No acute bony abnormality. Telemetry leads overlie the chest. IMPRESSION: No active disease. Electronically Signed   By: Kennith Center M.D.   On: 01/24/2023 12:23     Subjective: No acute issues or events overnight   Discharge Exam: Vitals:   02/20/23 0803 02/20/23 1154  BP: (!) 148/110 (!) 159/93  Pulse: 76 73  Resp: 16 15  Temp: 97.7 F (36.5 C) 97.9 F (36.6 C)  SpO2:  100%   Vitals:   02/19/23 2200 02/20/23 0405 02/20/23 0803 02/20/23 1154  BP: (!) 144/89 139/88 (!) 148/110 (!) 159/93  Pulse: 82 77 76 73  Resp: 16  16 15   Temp: 98.7 F (37.1 C) 98.4 F (36.9 C) 97.7 F (36.5 C) 97.9 F (36.6 C)  TempSrc: Oral Oral Oral Oral  SpO2: 99% 99%  100%  Weight:      Height:        General:  Pleasantly resting in bed, No acute distress. HEENT: Dry mucous membranes with poor dentition, hirsutism, enlarged frontal cranium. Neck:  Without mass or deformity.  Trachea is midline. Lungs:  Clear to auscultate bilaterally without rhonchi, wheeze, or rales. Heart:  Regular rate and rhythm.  Without murmurs, rubs, or gallops. Abdomen:  Soft, nontender, nondistended.  Without guarding or rebound. Extremities: Without cyanosis, clubbing, edema, or obvious deformity. Skin:  Warm and dry, no erythema.    The results of significant diagnostics from this hospitalization (including imaging, microbiology, ancillary and laboratory) are listed below for reference.     Microbiology: Recent Results (from the past 240 hour(s))  Urine Culture     Status: Abnormal    Collection Time: 02/14/23  9:37 PM   Specimen: Urine, Random  Result Value Ref Range Status   Specimen Description   Final    URINE, RANDOM Performed at [facility], [address], [place], [place] [address]    Special Requests   Final    URINE, RANDOM Performed at [facility], [address], [place], [place] [address]    Culture (A)  Final    >=100,000 COLONIES/mL ESCHERICHIA COLI 80,000 COLONIES/mL KLEBSIELLA PNEUMONIAE    Report Status 02/19/2023 FINAL  Final   Organism ID, Bacteria ESCHERICHIA COLI (A)  Final   Organism ID, Bacteria KLEBSIELLA PNEUMONIAE (A)  Final      Susceptibility   Escherichia coli - MIC*    AMPICILLIN 4 SENSITIVE Sensitive     CEFAZOLIN <=4 SENSITIVE Sensitive     CEFEPIME <=0.12 SENSITIVE Sensitive     CEFTRIAXONE <=0.25 SENSITIVE Sensitive     CIPROFLOXACIN <=0.25 SENSITIVE Sensitive     GENTAMICIN <=1 SENSITIVE Sensitive     IMIPENEM <=0.25 SENSITIVE Sensitive     NITROFURANTOIN <=16 SENSITIVE Sensitive     TRIMETH/SULFA <=20 SENSITIVE Sensitive     AMPICILLIN/SULBACTAM <=2 SENSITIVE Sensitive     PIP/TAZO <=4 SENSITIVE Sensitive     * >=100,000 COLONIES/mL ESCHERICHIA COLI   Klebsiella pneumoniae - MIC*    AMPICILLIN RESISTANT Resistant     CEFAZOLIN <=4 SENSITIVE Sensitive     CEFEPIME <=0.12 SENSITIVE Sensitive     CEFTRIAXONE <=0.25 SENSITIVE Sensitive     CIPROFLOXACIN <=0.25 SENSITIVE Sensitive     GENTAMICIN <=1 SENSITIVE Sensitive     IMIPENEM <=0.25 SENSITIVE Sensitive  NITROFURANTOIN 32 SENSITIVE Sensitive     TRIMETH/SULFA <=20 SENSITIVE Sensitive     AMPICILLIN/SULBACTAM 4 SENSITIVE Sensitive     PIP/TAZO <=4 SENSITIVE Sensitive     * 80,000 COLONIES/mL KLEBSIELLA PNEUMONIAE  MRSA Next Gen by PCR, Nasal     Status: None   Collection Time: 02/15/23  8:51 PM   Specimen: Nasal Mucosa; Nasal Swab  Result Value Ref Range Status   MRSA by PCR Next Gen NOT DETECTED NOT DETECTED Final    Comment: (NOTE) The GeneXpert  MRSA Assay (FDA approved for NASAL specimens only), is one component of a comprehensive MRSA colonization surveillance program. It is not intended to diagnose MRSA infection nor to guide or monitor treatment for MRSA infections. Test performance is not FDA approved in patients less than 16 years old. Performed at [facility], [address], [place], [place] [address]      Labs: BNP (last 3 results) No results for input(s): "BNP" in the last 8760 hours. Basic Metabolic Panel: Recent Labs  Lab 02/15/23 0640 02/15/23 2041 02/16/23 0532 02/17/23 0517 02/18/23 0304  NA 138 135 133* 133* 133*  K 3.2* 2.7* 4.0 3.6 3.5  CL 104 98 102 99 98  CO2 26 24 22  20* 23  GLUCOSE 85 98 98 75 72  BUN 7* 7* 7* 7* 6*  CREATININE 0.45 0.58 0.56 0.57 0.69  CALCIUM 8.3* 8.0* 7.9* 8.3* 8.5*  MG 1.5* 1.9 2.0 1.6* 1.5*   Liver Function Tests: Recent Labs  Lab 02/14/23 1531 02/15/23 0640 02/15/23 2041  AST 18 16 24   ALT 16 14 14   ALKPHOS 77 65 72  BILITOT 0.9 0.9 1.0  PROT 7.2 6.7 7.4  ALBUMIN 3.3* 3.0* 3.3*   CBC: Recent Labs  Lab 02/14/23 1531 02/15/23 0423 02/15/23 2041 02/18/23 0304  WBC 8.2 5.9 5.5 3.6*  NEUTROABS  --  3.5  --   --   HGB 12.6 11.4* 12.8 11.2*  HCT 38.0 34.2* 39.3 34.0*  MCV 88.0 88.4 88.7 86.5  PLT 263 251 288 237   CBG: Recent Labs  Lab 02/19/23 1706 02/19/23 2159 02/20/23 0622 02/20/23 0828 02/20/23 1153  GLUCAP 87 76 78 76 78   Lipid Profile Recent Labs    02/18/23 0304  CHOL 123  HDL 35*  LDLCALC 76  TRIG 62  CHOLHDL 3.5   Urinalysis    Component Value Date/Time   COLORURINE YELLOW 02/14/2023 2137   APPEARANCEUR CLOUDY (A) 02/14/2023 2137   LABSPEC 1.010 02/14/2023 2137   PHURINE 6.0 02/14/2023 2137   GLUCOSEU NEGATIVE 02/14/2023 2137   HGBUR SMALL (A) 02/14/2023 2137   BILIRUBINUR NEGATIVE 02/14/2023 2137   BILIRUBINUR neg 03/07/2018 1020   KETONESUR 5 (A) 02/14/2023 2137   PROTEINUR 30 (A) 02/14/2023 2137   UROBILINOGEN 0.2  03/07/2018 1020   UROBILINOGEN 1.0 08/29/2014 1206   NITRITE POSITIVE (A) 02/14/2023 2137   LEUKOCYTESUR LARGE (A) 02/14/2023 2137   Sepsis Labs Recent Labs  Lab 02/14/23 1531 02/15/23 0423 02/15/23 2041 02/18/23 0304  WBC 8.2 5.9 5.5 3.6*   Microbiology Recent Results (from the past 240 hour(s))  Urine Culture     Status: Abnormal   Collection Time: 02/14/23  9:37 PM   Specimen: Urine, Random  Result Value Ref Range Status   Specimen Description   Final    URINE, RANDOM Performed at [facility], [address], [place], [place] [address]    Special Requests   Final    URINE, RANDOM Performed at  [facility], [address] [place], [place] [address]    Culture (A)  Final    >=100,000 COLONIES/mL ESCHERICHIA COLI 80,000 COLONIES/mL KLEBSIELLA PNEUMONIAE    Report Status 02/19/2023 FINAL  Final   Organism ID, Bacteria ESCHERICHIA COLI (A)  Final   Organism ID, Bacteria KLEBSIELLA PNEUMONIAE (A)  Final      Susceptibility   Escherichia coli - MIC*    AMPICILLIN 4 SENSITIVE Sensitive     CEFAZOLIN <=4 SENSITIVE Sensitive     CEFEPIME <=0.12 SENSITIVE Sensitive     CEFTRIAXONE <=0.25 SENSITIVE Sensitive     CIPROFLOXACIN <=0.25 SENSITIVE Sensitive     GENTAMICIN <=1 SENSITIVE Sensitive     IMIPENEM <=0.25 SENSITIVE Sensitive     NITROFURANTOIN <=16 SENSITIVE Sensitive     TRIMETH/SULFA <=20 SENSITIVE Sensitive     AMPICILLIN/SULBACTAM <=2 SENSITIVE Sensitive     PIP/TAZO <=4 SENSITIVE Sensitive     * >=100,000 COLONIES/mL ESCHERICHIA COLI   Klebsiella pneumoniae - MIC*    AMPICILLIN RESISTANT Resistant     CEFAZOLIN <=4 SENSITIVE Sensitive     CEFEPIME <=0.12 SENSITIVE Sensitive     CEFTRIAXONE <=0.25 SENSITIVE Sensitive     CIPROFLOXACIN <=0.25 SENSITIVE Sensitive     GENTAMICIN <=1 SENSITIVE Sensitive     IMIPENEM <=0.25 SENSITIVE Sensitive     NITROFURANTOIN 32 SENSITIVE Sensitive     TRIMETH/SULFA <=20 SENSITIVE Sensitive     AMPICILLIN/SULBACTAM 4  SENSITIVE Sensitive     PIP/TAZO <=4 SENSITIVE Sensitive     * 80,000 COLONIES/mL KLEBSIELLA PNEUMONIAE  MRSA Next Gen by PCR, Nasal     Status: None   Collection Time: 02/15/23  8:51 PM   Specimen: Nasal Mucosa; Nasal Swab  Result Value Ref Range Status   MRSA by PCR Next Gen NOT DETECTED NOT DETECTED Final    Comment: (NOTE) The GeneXpert MRSA Assay (FDA approved for NASAL specimens only), is one component of a comprehensive MRSA colonization surveillance program. It is not intended to diagnose MRSA infection nor to guide or monitor treatment for MRSA infections. Test performance is not FDA approved in patients less than 70 years old. Performed at [facility], [address], [place], [place] [address]      Time coordinating discharge: Over 30 minutes  SIGNED:   Azucena Fallen, DO [facility] 02/20/2023, 12:48 PM Pager   If 7PM-7AM, please contact night-coverage [email or web address]

## 2023-02-20 NOTE — Progress Notes (Signed)
Still awaiting [facility] to transport patient.

## 2023-02-20 NOTE — Progress Notes (Signed)
Dr. Natale Milch made aware that the patient is not eating or drinking anything. Will continue to encourage patient.

## 2023-02-20 NOTE — Evaluation (Signed)
Occupational Therapy Evaluation Patient Details Name: Leah Payne MRN: [id number] DOB: 08-29-1945 Today's Date: 02/20/2023   History of Present Illness 77 y.o. female presents to [facility] hospital on 02/16/2023 as a transfer from [facility]. Pt presented with dizziness and gait instability. 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 Pt also noted to have UTI. PMH includes dementia, DMII, HLD, seizure, hydrocephalus, hypothyroidism.   Clinical Impression   Leah Payne was evaluated s/p the above admission list. Per chart review, pt lives at ALF/SNF at baseline. Pt presents with impaired cognition and [facility].  Upon evaluation the pt was limited by impaired cognition, weakness, tone, posterior bias in sitting, decreased activity tolerance and dizziness with positional changes. Overall she needed max-total A for bed mobility and initially needed max A for sitting balance, but progressed to min G. Due to the deficits listed below the pt also needs max-total A for ADLs. Pt will benefit from continued acute OT services and skilled inpatient follow up therapy, <3 hours/day.       Recommendations for follow up therapy are one component of a multi-disciplinary discharge planning process, led by the attending physician.  Recommendations may be updated based on patient status, additional functional criteria and insurance authorization.   Assistance Recommended at Discharge Frequent or constant Supervision/Assistance  Patient can return home with the following A lot of help with walking and/or transfers;A lot of help with bathing/dressing/bathroom;Two people to help with bathing/dressing/bathroom;Assistance with cooking/housework;Assistance with feeding;Direct supervision/assist for medications management;Direct supervision/assist for financial management;Assist for  transportation;Help with stairs or ramp for entrance    Functional Status Assessment  Patient has had a recent decline in their functional status and demonstrates the ability to make significant improvements in function in a reasonable and predictable amount of time.  Equipment Recommendations  None recommended by OT       Precautions / Restrictions Precautions Precautions: Fall Restrictions Weight Bearing Restrictions: No      Mobility Bed Mobility Overal bed mobility: Needs Assistance Bed Mobility: Supine to Sit, Sit to Supine     Supine to sit: Max assist Sit to supine: Total assist        Transfers Overall transfer level: Needs assistance                 General transfer comment: did not attempt due to strong posterior bias      Balance Overall balance assessment: Needs assistance Sitting-balance support: Single extremity supported, Feet supported Sitting balance-Leahy Scale: Poor Sitting balance - Comments: moments of min G, pt able to hold balance with posterior lean with BUE supported Postural control: Posterior lean                                 ADL either performed or assessed with clinical judgement   ADL Overall ADL's : Needs assistance/impaired Eating/Feeding: Maximal assistance   Grooming: Modified independent   Upper Body Bathing: Maximal assistance   Lower Body Bathing: Total assistance;Bed level   Upper Body Dressing : Maximal assistance;Sitting   Lower Body Dressing: Total assistance   Toilet Transfer: Total assistance   Toileting- Clothing Manipulation and Hygiene: Total assistance;Bed level       Functional mobility during ADLs: Total assistance General ADL Comments: soiled with urine upon arrival, unaware. posterior bias in sitting and  needs max A fro UB ADLs. total A for all LB ADLs at bed level     Vision Baseline Vision/History: 1 Wears glasses Vision Assessment?: No apparent visual deficits Additional  Comments: did not formally assess. 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 Pt states [facility] at [facility]," and later states she lives with he husband. Poor initiation for all tasks     General Comments  VSS, RN present assisting in linen change     Home Living Family/patient expects to be discharged to:: Skilled nursing facility         Home Equipment: Rolling Walker (2 wheels);Rollator (4 wheels)   Additional Comments: pt is unable to provide a history due to communication and cognitive deficits      Prior Functioning/Environment Prior Level of Function : Patient poor historian/Family not available             Mobility Comments: pt reports ambulating at baseline however she is a very limited historian and significantly altered at this time ADLs Comments: per chart assisted by ALF staff        OT Problem List: Decreased strength;Decreased range of motion;Decreased activity tolerance;Impaired balance (sitting and/or  standing);Decreased cognition;Decreased safety awareness;Decreased knowledge of use of DME or AE;Decreased knowledge of precautions      OT Treatment/Interventions: Self-care/ADL training;Therapeutic exercise;DME and/or AE instruction;Therapeutic activities;Patient/family education;Balance training    OT Goals(Current goals can be found in the care plan section) Acute Rehab OT Goals Patient Stated Goal: did not state OT Goal Formulation: With patient Time For Goal Achievement: 03/06/23 Potential to Achieve Goals: Good ADL Goals Pt Will Perform Grooming: with min assist;sitting Pt Will Perform Upper Body Dressing: with min assist;sitting Pt Will Transfer to Toilet: with max assist;stand pivot transfer;bedside commode Additional ADL Goal #1: pt will complete bed mobility with mod A as a precursor to ADLs  OT Frequency: Min 2X/week       AM-PAC OT "6 Clicks" Daily Activity     Outcome Measure Help from another person eating meals?: A Lot Help from another person taking care of personal grooming?: A Lot Help from another person toileting, which includes using toliet, bedpan, or urinal?: Total Help from another person bathing (including washing, rinsing, drying)?: Total Help from another person to put on and taking off regular upper body clothing?: A Lot Help from another person to put on and taking off regular lower body clothing?: Total 6 Click Score: 9   End of Session Nurse Communication: Mobility status  Activity Tolerance: Patient  tolerated treatment well Patient left: in bed;with call bell/phone within reach;with bed alarm set  OT Visit Diagnosis: Unsteadiness on feet (R26.81);Other abnormalities of gait and mobility (R26.89);Muscle weakness (generalized) (M62.81)                Time: 4010-2725 OT Time Calculation (min): 17 min Charges:  OT General Charges $OT Visit: 1 Visit OT Evaluation $OT Eval Moderate Complexity: 1 Mod  Derenda Mis, OTR/L Acute Rehabilitation  Services Office [phone] Secure Chat Communication Preferred   Donia Pounds 02/20/2023, 11:27 AM

## 2023-02-20 NOTE — Plan of Care (Signed)
  Problem: Clinical Measurements: Goal: Will remain free from infection Outcome: Progressing Goal: Diagnostic test results will improve Outcome: Progressing   

## 2023-02-20 NOTE — Consult Note (Signed)
[facility]) Accountable Care Organization (ACO) [facility] Liaison Note  02/20/2023  [name] 17-May-1946 [id number]  Location: [facility] RN Hospital Liaison screened the patient remotely at [facility]  Insurance: [employer]   Leah Payne is a 77 y.o. female who is a Primary Care Patient of Tommie Sams, DO [facility]). The patient was screened for  readmission hospitalization with noted medium risk score for unplanned readmission risk with 1 IP/3 ED in 6 months.  The patient was assessed for potential [facility]) Care Management service needs for post hospital transition for care coordination. Review of patient's electronic medical record reveals patient was admitted for CVA. Pt will be transfer to [facility] for further tests.  Plan: [facility] Liaison will continue to follow progress and disposition to asess for post hospital community care coordination/management needs.  Referral request for community care coordination: pending disposition.   [facility] Care Management/Population Health does not replace or interfere with any arrangements made by the Inpatient Transition of Care team.   For questions contact:   Elliot Cousin, RN, [facility] Liaison    Population Health Office Hours MTWF  8:00 am-6:00 pm Off on Thursday [phone] mobile [phone] [Office toll free line] Office Hours are M-F 8:30 - 5 pm 24 hour nurse advise line [phone] [email or web address]  [email or web address]

## 2023-02-21 DIAGNOSIS — N390 Urinary tract infection, site not specified: Secondary | ICD-10-CM | POA: Diagnosis not present

## 2023-02-21 DIAGNOSIS — I6350 Cerebral infarction due to unspecified occlusion or stenosis of unspecified cerebral artery: Secondary | ICD-10-CM | POA: Diagnosis not present

## 2023-02-21 DIAGNOSIS — R6520 Severe sepsis without septic shock: Secondary | ICD-10-CM | POA: Diagnosis not present

## 2023-02-21 DIAGNOSIS — Z79899 Other long term (current) drug therapy: Secondary | ICD-10-CM | POA: Diagnosis not present

## 2023-02-21 DIAGNOSIS — A419 Sepsis, unspecified organism: Secondary | ICD-10-CM | POA: Diagnosis not present

## 2023-02-21 DIAGNOSIS — E119 Type 2 diabetes mellitus without complications: Secondary | ICD-10-CM | POA: Diagnosis not present

## 2023-02-21 DIAGNOSIS — I69398 Other sequelae of cerebral infarction: Secondary | ICD-10-CM | POA: Diagnosis not present

## 2023-02-22 LAB — VITAMIN B1: Vitamin B1 (Thiamine): 57.4 nmol/L — ABNORMAL LOW (ref 66.5–200.0)

## 2023-02-23 DIAGNOSIS — I456 Pre-excitation syndrome: Secondary | ICD-10-CM | POA: Diagnosis not present

## 2023-02-23 DIAGNOSIS — E039 Hypothyroidism, unspecified: Secondary | ICD-10-CM | POA: Diagnosis not present

## 2023-02-23 DIAGNOSIS — E876 Hypokalemia: Secondary | ICD-10-CM | POA: Diagnosis not present

## 2023-02-23 DIAGNOSIS — Z7189 Other specified counseling: Secondary | ICD-10-CM | POA: Diagnosis not present

## 2023-02-28 DIAGNOSIS — Z79899 Other long term (current) drug therapy: Secondary | ICD-10-CM | POA: Diagnosis not present

## 2023-02-28 DIAGNOSIS — E785 Hyperlipidemia, unspecified: Secondary | ICD-10-CM | POA: Diagnosis not present

## 2023-02-28 DIAGNOSIS — I6350 Cerebral infarction due to unspecified occlusion or stenosis of unspecified cerebral artery: Secondary | ICD-10-CM | POA: Diagnosis not present

## 2023-02-28 DIAGNOSIS — N3281 Overactive bladder: Secondary | ICD-10-CM | POA: Diagnosis not present

## 2023-03-06 DIAGNOSIS — B3731 Acute candidiasis of vulva and vagina: Secondary | ICD-10-CM | POA: Diagnosis not present

## 2023-03-06 DIAGNOSIS — Z79899 Other long term (current) drug therapy: Secondary | ICD-10-CM | POA: Diagnosis not present

## 2023-03-09 DIAGNOSIS — Z79899 Other long term (current) drug therapy: Secondary | ICD-10-CM | POA: Diagnosis not present

## 2023-03-09 DIAGNOSIS — E876 Hypokalemia: Secondary | ICD-10-CM | POA: Diagnosis not present

## 2023-03-15 DIAGNOSIS — N390 Urinary tract infection, site not specified: Secondary | ICD-10-CM | POA: Diagnosis not present

## 2023-03-15 DIAGNOSIS — I6350 Cerebral infarction due to unspecified occlusion or stenosis of unspecified cerebral artery: Secondary | ICD-10-CM | POA: Diagnosis not present

## 2023-03-15 DIAGNOSIS — E039 Hypothyroidism, unspecified: Secondary | ICD-10-CM | POA: Diagnosis not present

## 2023-03-15 DIAGNOSIS — R6520 Severe sepsis without septic shock: Secondary | ICD-10-CM | POA: Diagnosis not present

## 2023-03-15 DIAGNOSIS — E785 Hyperlipidemia, unspecified: Secondary | ICD-10-CM | POA: Diagnosis not present

## 2023-03-15 DIAGNOSIS — E119 Type 2 diabetes mellitus without complications: Secondary | ICD-10-CM | POA: Diagnosis not present

## 2023-03-15 DIAGNOSIS — I456 Pre-excitation syndrome: Secondary | ICD-10-CM | POA: Diagnosis not present

## 2023-03-20 DIAGNOSIS — Z9181 History of falling: Secondary | ICD-10-CM | POA: Diagnosis not present

## 2023-03-20 DIAGNOSIS — R52 Pain, unspecified: Secondary | ICD-10-CM | POA: Diagnosis not present

## 2023-03-20 DIAGNOSIS — Z79899 Other long term (current) drug therapy: Secondary | ICD-10-CM | POA: Diagnosis not present

## 2023-03-22 DIAGNOSIS — R262 Difficulty in walking, not elsewhere classified: Secondary | ICD-10-CM | POA: Diagnosis not present

## 2023-03-22 DIAGNOSIS — M6281 Muscle weakness (generalized): Secondary | ICD-10-CM | POA: Diagnosis not present

## 2023-03-22 DIAGNOSIS — R2689 Other abnormalities of gait and mobility: Secondary | ICD-10-CM | POA: Diagnosis not present

## 2023-03-23 DIAGNOSIS — R262 Difficulty in walking, not elsewhere classified: Secondary | ICD-10-CM | POA: Diagnosis not present

## 2023-03-23 DIAGNOSIS — M6281 Muscle weakness (generalized): Secondary | ICD-10-CM | POA: Diagnosis not present

## 2023-03-23 DIAGNOSIS — R2689 Other abnormalities of gait and mobility: Secondary | ICD-10-CM | POA: Diagnosis not present

## 2023-03-24 DIAGNOSIS — R262 Difficulty in walking, not elsewhere classified: Secondary | ICD-10-CM | POA: Diagnosis not present

## 2023-03-24 DIAGNOSIS — R2689 Other abnormalities of gait and mobility: Secondary | ICD-10-CM | POA: Diagnosis not present

## 2023-03-24 DIAGNOSIS — M6281 Muscle weakness (generalized): Secondary | ICD-10-CM | POA: Diagnosis not present

## 2023-03-27 DIAGNOSIS — R2689 Other abnormalities of gait and mobility: Secondary | ICD-10-CM | POA: Diagnosis not present

## 2023-03-27 DIAGNOSIS — M6281 Muscle weakness (generalized): Secondary | ICD-10-CM | POA: Diagnosis not present

## 2023-03-27 DIAGNOSIS — R262 Difficulty in walking, not elsewhere classified: Secondary | ICD-10-CM | POA: Diagnosis not present

## 2023-03-30 DIAGNOSIS — M6281 Muscle weakness (generalized): Secondary | ICD-10-CM | POA: Diagnosis not present

## 2023-03-30 DIAGNOSIS — R2689 Other abnormalities of gait and mobility: Secondary | ICD-10-CM | POA: Diagnosis not present

## 2023-03-30 DIAGNOSIS — R262 Difficulty in walking, not elsewhere classified: Secondary | ICD-10-CM | POA: Diagnosis not present

## 2023-03-31 DIAGNOSIS — M6281 Muscle weakness (generalized): Secondary | ICD-10-CM | POA: Diagnosis not present

## 2023-03-31 DIAGNOSIS — R262 Difficulty in walking, not elsewhere classified: Secondary | ICD-10-CM | POA: Diagnosis not present

## 2023-03-31 DIAGNOSIS — R2689 Other abnormalities of gait and mobility: Secondary | ICD-10-CM | POA: Diagnosis not present

## 2023-04-01 DIAGNOSIS — R262 Difficulty in walking, not elsewhere classified: Secondary | ICD-10-CM | POA: Diagnosis not present

## 2023-04-01 DIAGNOSIS — R2689 Other abnormalities of gait and mobility: Secondary | ICD-10-CM | POA: Diagnosis not present

## 2023-04-01 DIAGNOSIS — M6281 Muscle weakness (generalized): Secondary | ICD-10-CM | POA: Diagnosis not present

## 2023-04-03 DIAGNOSIS — M6281 Muscle weakness (generalized): Secondary | ICD-10-CM | POA: Diagnosis not present

## 2023-04-03 DIAGNOSIS — R2689 Other abnormalities of gait and mobility: Secondary | ICD-10-CM | POA: Diagnosis not present

## 2023-04-03 DIAGNOSIS — Z79899 Other long term (current) drug therapy: Secondary | ICD-10-CM | POA: Diagnosis not present

## 2023-04-03 DIAGNOSIS — R262 Difficulty in walking, not elsewhere classified: Secondary | ICD-10-CM | POA: Diagnosis not present

## 2023-04-04 ENCOUNTER — Encounter (HOSPITAL_COMMUNITY): Payer: Self-pay

## 2023-04-04 DIAGNOSIS — R319 Hematuria, unspecified: Secondary | ICD-10-CM | POA: Diagnosis not present

## 2023-04-04 DIAGNOSIS — R110 Nausea: Secondary | ICD-10-CM | POA: Diagnosis not present

## 2023-04-04 DIAGNOSIS — R310 Gross hematuria: Secondary | ICD-10-CM | POA: Diagnosis present

## 2023-04-04 DIAGNOSIS — N3001 Acute cystitis with hematuria: Secondary | ICD-10-CM | POA: Diagnosis not present

## 2023-04-04 DIAGNOSIS — Z8673 Personal history of transient ischemic attack (TIA), and cerebral infarction without residual deficits: Secondary | ICD-10-CM | POA: Diagnosis not present

## 2023-04-04 DIAGNOSIS — I499 Cardiac arrhythmia, unspecified: Secondary | ICD-10-CM | POA: Diagnosis not present

## 2023-04-04 DIAGNOSIS — I959 Hypotension, unspecified: Secondary | ICD-10-CM | POA: Diagnosis not present

## 2023-04-04 DIAGNOSIS — N3020 Other chronic cystitis without hematuria: Secondary | ICD-10-CM | POA: Diagnosis not present

## 2023-04-04 DIAGNOSIS — E039 Hypothyroidism, unspecified: Secondary | ICD-10-CM | POA: Diagnosis not present

## 2023-04-04 DIAGNOSIS — Z79899 Other long term (current) drug therapy: Secondary | ICD-10-CM | POA: Diagnosis not present

## 2023-04-04 DIAGNOSIS — G919 Hydrocephalus, unspecified: Secondary | ICD-10-CM | POA: Diagnosis not present

## 2023-04-04 DIAGNOSIS — G309 Alzheimer's disease, unspecified: Secondary | ICD-10-CM | POA: Diagnosis not present

## 2023-04-04 DIAGNOSIS — R58 Hemorrhage, not elsewhere classified: Secondary | ICD-10-CM | POA: Diagnosis not present

## 2023-04-04 DIAGNOSIS — I69398 Other sequelae of cerebral infarction: Secondary | ICD-10-CM | POA: Diagnosis not present

## 2023-04-04 DIAGNOSIS — Z7902 Long term (current) use of antithrombotics/antiplatelets: Secondary | ICD-10-CM | POA: Diagnosis not present

## 2023-04-04 DIAGNOSIS — N3289 Other specified disorders of bladder: Secondary | ICD-10-CM | POA: Diagnosis not present

## 2023-04-04 DIAGNOSIS — R6889 Other general symptoms and signs: Secondary | ICD-10-CM | POA: Diagnosis not present

## 2023-04-04 DIAGNOSIS — Z743 Need for continuous supervision: Secondary | ICD-10-CM | POA: Diagnosis not present

## 2023-04-04 DIAGNOSIS — Z7982 Long term (current) use of aspirin: Secondary | ICD-10-CM | POA: Diagnosis not present

## 2023-04-05 DIAGNOSIS — N3289 Other specified disorders of bladder: Secondary | ICD-10-CM | POA: Insufficient documentation

## 2023-04-10 DIAGNOSIS — R2689 Other abnormalities of gait and mobility: Secondary | ICD-10-CM | POA: Diagnosis not present

## 2023-04-10 DIAGNOSIS — R262 Difficulty in walking, not elsewhere classified: Secondary | ICD-10-CM | POA: Diagnosis not present

## 2023-04-10 DIAGNOSIS — E871 Hypo-osmolality and hyponatremia: Secondary | ICD-10-CM | POA: Diagnosis not present

## 2023-04-10 DIAGNOSIS — R296 Repeated falls: Secondary | ICD-10-CM | POA: Diagnosis not present

## 2023-04-10 DIAGNOSIS — Z79899 Other long term (current) drug therapy: Secondary | ICD-10-CM | POA: Diagnosis not present

## 2023-04-10 DIAGNOSIS — M6281 Muscle weakness (generalized): Secondary | ICD-10-CM | POA: Diagnosis not present

## 2023-04-10 DIAGNOSIS — R310 Gross hematuria: Secondary | ICD-10-CM | POA: Diagnosis not present

## 2023-04-11 ENCOUNTER — Other Ambulatory Visit: Payer: Self-pay

## 2023-04-11 ENCOUNTER — Emergency Department (HOSPITAL_COMMUNITY): Payer: 59

## 2023-04-11 ENCOUNTER — Encounter (HOSPITAL_COMMUNITY): Payer: Self-pay

## 2023-04-11 ENCOUNTER — Inpatient Hospital Stay (HOSPITAL_COMMUNITY)
Admission: EM | Admit: 2023-04-11 | Discharge: 2023-04-15 | DRG: 689 | Disposition: A | Discharge status: Skilled Nursing Facility | Payer: 59 | Attending: Student | Admitting: Student

## 2023-04-11 DIAGNOSIS — Q039 Congenital hydrocephalus, unspecified: Secondary | ICD-10-CM

## 2023-04-11 DIAGNOSIS — G40909 Epilepsy, unspecified, not intractable, without status epilepticus: Secondary | ICD-10-CM | POA: Diagnosis present

## 2023-04-11 DIAGNOSIS — Z8673 Personal history of transient ischemic attack (TIA), and cerebral infarction without residual deficits: Secondary | ICD-10-CM

## 2023-04-11 DIAGNOSIS — Z79899 Other long term (current) drug therapy: Secondary | ICD-10-CM | POA: Diagnosis not present

## 2023-04-11 DIAGNOSIS — Z7982 Long term (current) use of aspirin: Secondary | ICD-10-CM | POA: Diagnosis not present

## 2023-04-11 DIAGNOSIS — R578 Other shock: Principal | ICD-10-CM | POA: Diagnosis present

## 2023-04-11 DIAGNOSIS — R319 Hematuria, unspecified: Principal | ICD-10-CM

## 2023-04-11 DIAGNOSIS — E785 Hyperlipidemia, unspecified: Secondary | ICD-10-CM | POA: Diagnosis present

## 2023-04-11 DIAGNOSIS — R310 Gross hematuria: Secondary | ICD-10-CM | POA: Diagnosis present

## 2023-04-11 DIAGNOSIS — I456 Pre-excitation syndrome: Secondary | ICD-10-CM | POA: Diagnosis not present

## 2023-04-11 DIAGNOSIS — Z7902 Long term (current) use of antithrombotics/antiplatelets: Secondary | ICD-10-CM | POA: Diagnosis not present

## 2023-04-11 DIAGNOSIS — F0390 Unspecified dementia without behavioral disturbance: Secondary | ICD-10-CM | POA: Diagnosis present

## 2023-04-11 DIAGNOSIS — E039 Hypothyroidism, unspecified: Secondary | ICD-10-CM | POA: Diagnosis present

## 2023-04-11 DIAGNOSIS — R2681 Unsteadiness on feet: Secondary | ICD-10-CM | POA: Diagnosis present

## 2023-04-11 DIAGNOSIS — F919 Conduct disorder, unspecified: Secondary | ICD-10-CM | POA: Diagnosis present

## 2023-04-11 DIAGNOSIS — E1169 Type 2 diabetes mellitus with other specified complication: Secondary | ICD-10-CM | POA: Diagnosis present

## 2023-04-11 DIAGNOSIS — R000 Tachycardia, unspecified: Secondary | ICD-10-CM | POA: Diagnosis not present

## 2023-04-11 DIAGNOSIS — I10 Essential (primary) hypertension: Secondary | ICD-10-CM | POA: Diagnosis not present

## 2023-04-11 DIAGNOSIS — E876 Hypokalemia: Secondary | ICD-10-CM | POA: Diagnosis present

## 2023-04-11 DIAGNOSIS — Z66 Do not resuscitate: Secondary | ICD-10-CM | POA: Diagnosis present

## 2023-04-11 DIAGNOSIS — G309 Alzheimer's disease, unspecified: Secondary | ICD-10-CM | POA: Diagnosis present

## 2023-04-11 DIAGNOSIS — Z743 Need for continuous supervision: Secondary | ICD-10-CM | POA: Diagnosis not present

## 2023-04-11 DIAGNOSIS — E872 Acidosis, unspecified: Secondary | ICD-10-CM | POA: Diagnosis present

## 2023-04-11 DIAGNOSIS — F0280 Dementia in other diseases classified elsewhere without behavioral disturbance: Secondary | ICD-10-CM | POA: Diagnosis present

## 2023-04-11 DIAGNOSIS — I9589 Other hypotension: Secondary | ICD-10-CM | POA: Diagnosis not present

## 2023-04-11 DIAGNOSIS — R6889 Other general symptoms and signs: Secondary | ICD-10-CM | POA: Diagnosis not present

## 2023-04-11 DIAGNOSIS — D62 Acute posthemorrhagic anemia: Secondary | ICD-10-CM | POA: Diagnosis present

## 2023-04-11 DIAGNOSIS — F0283 Dementia in other diseases classified elsewhere, unspecified severity, with mood disturbance: Secondary | ICD-10-CM | POA: Diagnosis present

## 2023-04-11 DIAGNOSIS — I499 Cardiac arrhythmia, unspecified: Secondary | ICD-10-CM | POA: Diagnosis not present

## 2023-04-11 DIAGNOSIS — Z1152 Encounter for screening for COVID-19: Secondary | ICD-10-CM | POA: Diagnosis not present

## 2023-04-11 DIAGNOSIS — L89152 Pressure ulcer of sacral region, stage 2: Secondary | ICD-10-CM | POA: Diagnosis present

## 2023-04-11 DIAGNOSIS — R739 Hyperglycemia, unspecified: Secondary | ICD-10-CM | POA: Diagnosis present

## 2023-04-11 DIAGNOSIS — R531 Weakness: Secondary | ICD-10-CM

## 2023-04-11 DIAGNOSIS — Z7401 Bed confinement status: Secondary | ICD-10-CM | POA: Diagnosis not present

## 2023-04-11 DIAGNOSIS — E1142 Type 2 diabetes mellitus with diabetic polyneuropathy: Secondary | ICD-10-CM | POA: Diagnosis present

## 2023-04-11 DIAGNOSIS — Z820 Family history of epilepsy and other diseases of the nervous system: Secondary | ICD-10-CM

## 2023-04-11 DIAGNOSIS — H9190 Unspecified hearing loss, unspecified ear: Secondary | ICD-10-CM | POA: Diagnosis present

## 2023-04-11 DIAGNOSIS — N3281 Overactive bladder: Secondary | ICD-10-CM | POA: Diagnosis not present

## 2023-04-11 DIAGNOSIS — R5381 Other malaise: Secondary | ICD-10-CM | POA: Diagnosis not present

## 2023-04-11 DIAGNOSIS — H918X3 Other specified hearing loss, bilateral: Secondary | ICD-10-CM | POA: Diagnosis present

## 2023-04-11 DIAGNOSIS — Z79890 Hormone replacement therapy (postmenopausal): Secondary | ICD-10-CM

## 2023-04-11 DIAGNOSIS — Z823 Family history of stroke: Secondary | ICD-10-CM

## 2023-04-11 DIAGNOSIS — R58 Hemorrhage, not elsewhere classified: Secondary | ICD-10-CM | POA: Diagnosis not present

## 2023-04-11 DIAGNOSIS — Z8249 Family history of ischemic heart disease and other diseases of the circulatory system: Secondary | ICD-10-CM

## 2023-04-11 DIAGNOSIS — E1165 Type 2 diabetes mellitus with hyperglycemia: Secondary | ICD-10-CM | POA: Diagnosis present

## 2023-04-11 DIAGNOSIS — R54 Age-related physical debility: Secondary | ICD-10-CM | POA: Diagnosis present

## 2023-04-11 DIAGNOSIS — N3091 Cystitis, unspecified with hematuria: Principal | ICD-10-CM | POA: Diagnosis present

## 2023-04-11 DIAGNOSIS — R569 Unspecified convulsions: Secondary | ICD-10-CM | POA: Diagnosis not present

## 2023-04-11 HISTORY — DX: Gross hematuria: R31.0

## 2023-04-11 HISTORY — DX: Epilepsy, unspecified, not intractable, without status epilepticus: G40.909

## 2023-04-11 HISTORY — DX: Personal history of transient ischemic attack (TIA), and cerebral infarction without residual deficits: Z86.73

## 2023-04-11 HISTORY — DX: Dementia in other diseases classified elsewhere, unspecified severity, without behavioral disturbance, psychotic disturbance, mood disturbance, and anxiety: F02.80

## 2023-04-11 HISTORY — DX: Cerebral infarction due to unspecified occlusion or stenosis of unspecified cerebral artery: I63.50

## 2023-04-11 LAB — LACTIC ACID, PLASMA
Lactic Acid, Venous: 8 mmol/L (ref 0.5–1.9)
Lactic Acid, Venous: 6.2 mmol/L (ref 0.5–1.9)
Lactic Acid, Venous: 1.1 mmol/L (ref 0.5–1.9)

## 2023-04-11 LAB — CBC WITH DIFFERENTIAL/PLATELET
Abs Immature Granulocytes: 0.08 10*3/uL — ABNORMAL HIGH (ref 0.00–0.07)
Basophils Absolute: 0 10*3/uL (ref 0.0–0.1)
Basophils Relative: 0 %
Eosinophils Absolute: 0 10*3/uL (ref 0.0–0.5)
Eosinophils Relative: 0 %
HCT: 21 % — ABNORMAL LOW (ref 36.0–46.0)
Hemoglobin: 6.8 g/dL — CL (ref 12.0–15.0)
Immature Granulocytes: 1 %
Lymphocytes Relative: 41 %
Lymphs Abs: 2.8 10*3/uL (ref 0.7–4.0)
MCH: 30.2 pg (ref 26.0–34.0)
MCHC: 32.4 g/dL (ref 30.0–36.0)
MCV: 93.3 fL (ref 80.0–100.0)
Monocytes Absolute: 0.5 10*3/uL (ref 0.1–1.0)
Monocytes Relative: 6 %
Neutro Abs: 3.6 10*3/uL (ref 1.7–7.7)
Neutrophils Relative %: 52 %
Platelets: 351 10*3/uL (ref 150–400)
RBC: 2.25 MIL/uL — ABNORMAL LOW (ref 3.87–5.11)
RDW: 14.3 % (ref 11.5–15.5)
WBC: 7 10*3/uL (ref 4.0–10.5)
nRBC: 0 % (ref 0.0–0.2)

## 2023-04-11 LAB — COMPREHENSIVE METABOLIC PANEL
ALT: 13 U/L (ref 0–44)
AST: 38 U/L (ref 15–41)
Albumin: 2.1 g/dL — ABNORMAL LOW (ref 3.5–5.0)
Alkaline Phosphatase: 51 U/L (ref 38–126)
Anion gap: 17 — ABNORMAL HIGH (ref 5–15)
BUN: 12 mg/dL (ref 8–23)
CO2: 21 mmol/L — ABNORMAL LOW (ref 22–32)
Calcium: 7.8 mg/dL — ABNORMAL LOW (ref 8.9–10.3)
Chloride: 99 mmol/L (ref 98–111)
Creatinine, Ser: 0.94 mg/dL (ref 0.44–1.00)
GFR, Estimated: >60 mL/min (ref >60)
Glucose, Bld: 245 mg/dL — ABNORMAL HIGH (ref 70–99)
Potassium: 3 mmol/L — ABNORMAL LOW (ref 3.5–5.1)
Sodium: 137 mmol/L (ref 135–145)
Total Bilirubin: 0.4 mg/dL (ref 0.3–1.2)
Total Protein: 4.6 g/dL — ABNORMAL LOW (ref 6.5–8.1)

## 2023-04-11 LAB — RESP PANEL BY RT-PCR (RSV, FLU A&B, COVID)  RVPGX2
Influenza A by PCR: NEGATIVE
Influenza B by PCR: NEGATIVE
Resp Syncytial Virus by PCR: NEGATIVE
SARS Coronavirus 2 by RT PCR: NEGATIVE

## 2023-04-11 LAB — PREPARE RBC (CROSSMATCH)

## 2023-04-11 LAB — PROTIME-INR
INR: 1.2 (ref 0.8–1.2)
Prothrombin Time: 15.6 s — ABNORMAL HIGH (ref 11.4–15.2)

## 2023-04-11 LAB — URINALYSIS, W/ REFLEX TO CULTURE (INFECTION SUSPECTED)
Bacteria, UA: NONE SEEN
Hgb urine dipstick: NEGATIVE
Protein, ur: NEGATIVE mg/dL
RBC / HPF: >50 RBC/hpf (ref 0–5)

## 2023-04-11 LAB — I-STAT CHEM 8, ED
BUN: 10 mg/dL (ref 8–23)
Calcium, Ion: 1.06 mmol/L — ABNORMAL LOW (ref 1.15–1.40)
Chloride: 100 mmol/L (ref 98–111)
Creatinine, Ser: 0.8 mg/dL (ref 0.44–1.00)
Glucose, Bld: 244 mg/dL — ABNORMAL HIGH (ref 70–99)
HCT: 20 % — ABNORMAL LOW (ref 36.0–46.0)
Hemoglobin: 6.8 g/dL — CL (ref 12.0–15.0)
Potassium: 3.2 mmol/L — ABNORMAL LOW (ref 3.5–5.1)
Sodium: 138 mmol/L (ref 135–145)
TCO2: 20 mmol/L — ABNORMAL LOW (ref 22–32)

## 2023-04-11 LAB — ABO/RH: ABO/RH(D): A POS

## 2023-04-11 LAB — HEMOGLOBIN AND HEMATOCRIT, BLOOD
HCT: 37.7 % (ref 36.0–46.0)
Hemoglobin: 13.2 g/dL (ref 12.0–15.0)

## 2023-04-11 LAB — TROPONIN I (HIGH SENSITIVITY)
Troponin I (High Sensitivity): 12 ng/L (ref <18)
Troponin I (High Sensitivity): 30 ng/L — ABNORMAL HIGH (ref <18)

## 2023-04-11 LAB — APTT: aPTT: 28 s (ref 24–36)

## 2023-04-11 LAB — CBG MONITORING, ED: Glucose-Capillary: 102 mg/dL — ABNORMAL HIGH (ref 70–99)

## 2023-04-11 LAB — POC OCCULT BLOOD, ED

## 2023-04-11 MED ORDER — MIRABEGRON ER 25 MG PO TB24
50.0000 mg | ORAL_TABLET | Freq: Every day | ORAL | Status: DC
  Administered 2023-04-12 – 2023-04-15 (×4): 50 mg via ORAL
  Filled 2023-04-11 (×4): qty 2

## 2023-04-11 MED ORDER — LEVOTHYROXINE SODIUM 25 MCG PO TABS
25.0000 ug | ORAL_TABLET | Freq: Every day | ORAL | Status: DC
  Administered 2023-04-12 – 2023-04-15 (×4): 25 ug via ORAL
  Filled 2023-04-11 (×4): qty 1

## 2023-04-11 MED ORDER — SODIUM CHLORIDE 0.9% FLUSH: 3.0000 mL | INTRAVENOUS | Status: DC | PRN

## 2023-04-11 MED ORDER — DEXTROMETHORPHAN POLISTIREX ER 30 MG/5ML PO SUER: 30.0000 mg | Freq: Two times a day (BID) | ORAL | Status: DC | PRN

## 2023-04-11 MED ORDER — BISACODYL 5 MG PO TBEC: 5.0000 mg | DELAYED_RELEASE_TABLET | Freq: Every day | ORAL | Status: DC | PRN

## 2023-04-11 MED ORDER — SODIUM CHLORIDE 0.9% IV SOLUTION: Freq: Once | INTRAVENOUS | Status: AC

## 2023-04-11 MED ORDER — SODIUM CHLORIDE 0.9% FLUSH
3.0000 mL | Freq: Two times a day (BID) | INTRAVENOUS | Status: DC
  Administered 2023-04-12 – 2023-04-15 (×6): 3 mL via INTRAVENOUS

## 2023-04-11 MED ORDER — ONDANSETRON HCL 4 MG PO TABS: 4.0000 mg | ORAL_TABLET | Freq: Four times a day (QID) | ORAL | Status: DC | PRN

## 2023-04-11 MED ORDER — LEVETIRACETAM 500 MG PO TABS
750.0000 mg | ORAL_TABLET | Freq: Two times a day (BID) | ORAL | Status: DC
  Administered 2023-04-11 – 2023-04-12 (×3): 750 mg via ORAL
  Filled 2023-04-11 (×4): qty 1

## 2023-04-11 MED ORDER — HYDROCODONE-ACETAMINOPHEN 5-325 MG PO TABS: 1.0000 | ORAL_TABLET | Freq: Four times a day (QID) | ORAL | Status: DC | PRN

## 2023-04-11 MED ORDER — ALBUTEROL SULFATE (2.5 MG/3ML) 0.083% IN NEBU: 2.5000 mg | INHALATION_SOLUTION | RESPIRATORY_TRACT | Status: DC | PRN

## 2023-04-11 MED ORDER — FENTANYL CITRATE PF 50 MCG/ML IJ SOSY
12.5000 ug | PREFILLED_SYRINGE | INTRAMUSCULAR | Status: DC | PRN
  Administered 2023-04-11: 12.5 ug via INTRAVENOUS
  Filled 2023-04-11: qty 1

## 2023-04-11 MED ORDER — SODIUM CHLORIDE 0.9 % IV BOLUS
2000.0000 mL | Freq: Once | INTRAVENOUS | Status: AC
  Administered 2023-04-11: 2000 mL via INTRAVENOUS

## 2023-04-11 MED ORDER — SERTRALINE HCL 100 MG PO TABS
100.0000 mg | ORAL_TABLET | ORAL | Status: DC
  Administered 2023-04-12 – 2023-04-15 (×3): 100 mg via ORAL
  Filled 2023-04-11 (×4): qty 1

## 2023-04-11 MED ORDER — INSULIN ASPART 100 UNIT/ML IJ SOLN: 0.0000 [IU] | Freq: Three times a day (TID) | INTRAMUSCULAR | Status: DC

## 2023-04-11 MED ORDER — ACETAMINOPHEN 650 MG RE SUPP: 650.0000 mg | Freq: Four times a day (QID) | RECTAL | Status: DC | PRN

## 2023-04-11 MED ORDER — PANTOPRAZOLE SODIUM 40 MG IV SOLR
40.0000 mg | Freq: Once | INTRAVENOUS | Status: AC
  Administered 2023-04-11: 40 mg via INTRAVENOUS
  Filled 2023-04-11: qty 10

## 2023-04-11 MED ORDER — ONDANSETRON HCL 4 MG/2ML IJ SOLN: 4.0000 mg | Freq: Four times a day (QID) | INTRAMUSCULAR | Status: DC | PRN

## 2023-04-11 MED ORDER — ACETAMINOPHEN 325 MG PO TABS
650.0000 mg | ORAL_TABLET | Freq: Four times a day (QID) | ORAL | Status: DC | PRN
  Administered 2023-04-12: 650 mg via ORAL
  Filled 2023-04-11: qty 2

## 2023-04-11 MED ORDER — MELATONIN 3 MG PO TABS
3.0000 mg | ORAL_TABLET | Freq: Every day | ORAL | Status: DC
  Administered 2023-04-11 – 2023-04-14 (×4): 3 mg via ORAL
  Filled 2023-04-11 (×4): qty 1

## 2023-04-11 MED ORDER — GUAIFENESIN ER 600 MG PO TB12: 600.0000 mg | ORAL_TABLET | Freq: Two times a day (BID) | ORAL | Status: DC | PRN

## 2023-04-11 MED ORDER — DONEPEZIL HCL 10 MG PO TABS
10.0000 mg | ORAL_TABLET | Freq: Every day | ORAL | Status: DC
  Administered 2023-04-11 – 2023-04-14 (×4): 10 mg via ORAL
  Filled 2023-04-11 (×2): qty 1
  Filled 2023-04-11: qty 2
  Filled 2023-04-11: qty 1

## 2023-04-11 MED ORDER — SODIUM CHLORIDE 0.9 % IV SOLN
2.0000 g | INTRAVENOUS | Status: AC
  Administered 2023-04-11 – 2023-04-15 (×5): 2 g via INTRAVENOUS
  Filled 2023-04-11 (×5): qty 20

## 2023-04-11 MED ORDER — SODIUM CHLORIDE 0.9 % IV SOLN: 250.0000 mL | INTRAVENOUS | Status: DC | PRN

## 2023-04-11 MED ORDER — ATORVASTATIN CALCIUM 40 MG PO TABS
40.0000 mg | ORAL_TABLET | Freq: Every evening | ORAL | Status: DC
  Administered 2023-04-11 – 2023-04-14 (×4): 40 mg via ORAL
  Filled 2023-04-11 (×4): qty 1

## 2023-04-11 NOTE — ED Notes (Signed)
ED TO INPATIENT HANDOFF REPORT  ED Nurse Name and Phone #:  Latricia Heft Name/Age/Gender Leah Payne 77 y.o. female Room/Bed: APA01/APA01  Code Status   Code Status: Limited: Do not attempt resuscitation (DNR) -DNR-LIMITED -Do Not Intubate/DNI   Home/SNF/Other Nursing Home Patient oriented to: self Is this baseline? Yes   Triage Complete: Triage complete  Chief Complaint Hemorrhagic shock [facility]) [R57.8]  Triage Note Pt brought in from [facility] by [employer]. When EMS arrived BP was 75/56, CBG 264. Pt received 50 cc of NS and EPI gtt started at 4 mcg/min with EMS. BP upon arrival 62/42, HR 119. Pt is lethargic but alert to verbal stimulation, skin color very pale    Allergies No Known Allergies  Level of Care/Admitting Diagnosis ED Disposition     ED Disposition  Admit   Condition  --   Comment  Hospital Area: [facility] [[id number]]  Level of Care: Stepdown [14]  Admit to SDU based on following criteria: Hemodynamic compromise or significant risk of instability:  Patient requiring short term acute titration and management of vasoactive drips, and invasive monitoring (i.e., CVP and Arterial line).  May admit patient to [facility] or [facility] if equivalent level of care is available:: No  Covid Evaluation: Asymptomatic - no recent exposure (last 10 days) testing not required  Diagnosis: Hemorrhagic shock [facility]) [[id number]]  Admitting Physician: Cleora Fleet [[id number]]  Attending Physician: Cleora Fleet [[id number]]  Certification:: I certify this patient will need inpatient services for at least 2 midnights          B Medical/Surgery History Past Medical History:  Diagnosis Date   Alzheimer disease (HCC)    Anomaly of cranium    frontal bossing, enlarged cranium, adult strabismus   Cerebral artery occlusion with cerebral infarction (HCC)    Dementia (HCC)    Diabetes mellitus type 2, controlled (HCC) 08/04/2013   Epilepsy (HCC)     Hematuria, gross    History of chicken pox    Hydrocephalus (HCC)    Hydrocephalus in adult (HCC) 08/29/2014   CT head showing obstructive hydrocephalus, 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) - saw Dr Gerlene Fee - no benefit from shunt placement    Hyperlipidemia 08/04/2013   Personal history of transient cerebral ischemia    Preaxial polydactyly of right hand congenital   Seizure disorder (HCC) 08/04/2013   WPW (Wolff-Parkinson-White syndrome)    has seen Dr Donnie Aho   Past Surgical History:  Procedure Laterality Date   CHOLECYSTECTOMY  1978     A IV Location/Drains/Wounds Patient Lines/Drains/Airways Status     Active Line/Drains/Airways     Name Placement date Placement time Site Days   Peripheral IV 04/11/23 18 G Left Antecubital 04/11/23  1109  Antecubital  less than 1   Peripheral IV 04/11/23 20 G Right Antecubital 04/11/23  1410  Antecubital  less than 1   Urethral Catheter Tori Parrish NT3 Temperature probe 14 Fr. 04/11/23  1200  Temperature probe  less than 1   Pressure Injury 07/29/19 Buttocks Left Stage 2 -  Partial thickness loss of dermis presenting as a shallow open injury with a red, pink wound bed without slough. 07/29/19  2201  -- 1352   Wound / Incision (Open or Dehisced) 02/15/23 (IAD) Incontinence Associated Dermatitis Vagina Right;Left;Mid;Upper;Lower red 02/15/23  1940  Vagina  55   Wound / Incision (Open or Dehisced) 02/15/23 (IAD) Incontinence Associated Dermatitis Perineum Right;Left;Mid;Upper;Medial;Lower red 02/15/23  1940  Perineum  55            Intake/Output Last 24 hours  Intake/Output Summary (Last 24 hours) at 04/11/2023 1820 Last data filed at 04/11/2023 1755 Gross per 24 hour  Intake 2386.26 ml  Output 400 ml  Net 1986.26 ml    Labs/Imaging Results for orders placed or performed during the hospital encounter of 04/11/23 (from the past 48 hour(s))  I-stat chem 8, ed     Status: Abnormal   Collection  Time: 04/11/23 11:22 AM  Result Value Ref Range   Sodium 138 135 - 145 mmol/L   Potassium 3.2 (L) 3.5 - 5.1 mmol/L   Chloride 100 98 - 111 mmol/L   BUN 10 8 - 23 mg/dL   Creatinine, Ser 9.56 0.44 - 1.00 mg/dL   Glucose, Bld 213 (H) 70 - 99 mg/dL    Comment: Glucose reference range applies only to samples taken after fasting for at least 8 hours.   Calcium, Ion 1.06 (L) 1.15 - 1.40 mmol/L   TCO2 20 (L) 22 - 32 mmol/L   Hemoglobin 6.8 (LL) 12.0 - 15.0 g/dL   HCT 08.6 (L) 57.8 - 46.9 %   Comment NOTIFIED PHYSICIAN   Lactic acid, plasma     Status: Abnormal   Collection Time: 04/11/23 11:28 AM  Result Value Ref Range   Lactic Acid, Venous 8.0 (HH) 0.5 - 1.9 mmol/L    Comment: CRITICAL RESULT CALLED TO, READ BACK BY AND VERIFIED WITH SMALLWOOD,M AT 12:15PM ON 04/11/23 BY  [facility] Performed at [facility], [address], [place], [place] [address]   Comprehensive metabolic panel     Status: Abnormal   Collection Time: 04/11/23 11:28 AM  Result Value Ref Range   Sodium 137 135 - 145 mmol/L   Potassium 3.0 (L) 3.5 - 5.1 mmol/L   Chloride 99 98 - 111 mmol/L   CO2 21 (L) 22 - 32 mmol/L   Glucose, Bld 245 (H) 70 - 99 mg/dL    Comment: Glucose reference range applies only to samples taken after fasting for at least 8 hours.   BUN 12 8 - 23 mg/dL   Creatinine, Ser 8.41 0.44 - 1.00 mg/dL   Calcium 7.8 (L) 8.9 - 10.3 mg/dL   Total Protein 4.6 (L) 6.5 - 8.1 g/dL   Albumin 2.1 (L) 3.5 - 5.0 g/dL   AST 38 15 - 41 U/L   ALT 13 0 - 44 U/L   Alkaline Phosphatase 51 38 - 126 U/L   Total Bilirubin 0.4 0.3 - 1.2 mg/dL   GFR, Estimated >32 >44 mL/min    Comment: (NOTE) Calculated using the CKD-EPI Creatinine Equation (2021)    Anion gap 17 (H) 5 - 15    Comment: Performed at [facility], [address] [place], [place] [address]  CBC with Differential     Status: Abnormal   Collection Time: 04/11/23 11:28 AM  Result Value Ref Range   WBC 7.0 4.0 - 10.5 K/uL   RBC 2.25 (L) 3.87 - 5.11  MIL/uL   Hemoglobin 6.8 (LL) 12.0 - 15.0 g/dL    Comment: REPEATED TO VERIFY THIS CRITICAL RESULT HAS VERIFIED AND BEEN CALLED TO M.SMALLWOOD BY TIASIA HAMER ON 09 17 2024 AT 1212, AND HAS BEEN READ BACK.     HCT 21.0 (L) 36.0 - 46.0 %   MCV 93.3 80.0 - 100.0 fL   MCH 30.2 26.0 - 34.0 pg   MCHC 32.4 30.0 - 36.0 g/dL   RDW  14.3 11.5 - 15.5 %   Platelets 351 150 - 400 K/uL   nRBC 0.0 0.0 - 0.2 %   Neutrophils Relative % 52 %   Neutro Abs 3.6 1.7 - 7.7 K/uL   Lymphocytes Relative 41 %   Lymphs Abs 2.8 0.7 - 4.0 K/uL   Monocytes Relative 6 %   Monocytes Absolute 0.5 0.1 - 1.0 K/uL   Eosinophils Relative 0 %   Eosinophils Absolute 0.0 0.0 - 0.5 K/uL   Basophils Relative 0 %   Basophils Absolute 0.0 0.0 - 0.1 K/uL   Immature Granulocytes 1 %   Abs Immature Granulocytes 0.08 (H) 0.00 - 0.07 K/uL    Comment: Performed at [facility], [address], [place], [place] [address]  Protime-INR     Status: Abnormal   Collection Time: 04/11/23 11:28 AM  Result Value Ref Range   Prothrombin Time 15.6 (H) 11.4 - 15.2 seconds   INR 1.2 0.8 - 1.2    Comment: (NOTE) INR goal varies based on device and disease states. Performed at [facility], [address], [place], [place] [address]   APTT     Status: None   Collection Time: 04/11/23 11:28 AM  Result Value Ref Range   aPTT 28 24 - 36 seconds    Comment: Performed at [facility], [address] [place], [place] [address]  Type and screen     Status: None (Preliminary result)   Collection Time: 04/11/23 11:28 AM  Result Value Ref Range   ABO/RH(D) A POS    Antibody Screen NEG    Sample Expiration 04/14/2023,2359    Unit Number [id number]    Blood Component Type RED CELLS,LR    Unit division 00    Status of Unit ISSUED    Unit tag comment EMERGENCY RELEASE    Transfusion Status OK TO TRANSFUSE    Crossmatch Result COMPATIBLE    Unit Number [id number]    Blood Component Type RBC LR PHER2    Unit division 00    Status of Unit  ISSUED    Transfusion Status OK TO TRANSFUSE    Crossmatch Result Compatible    Unit Number [id number]    Blood Component Type RED CELLS,LR    Unit division 00    Status of Unit ISSUED    Transfusion Status OK TO TRANSFUSE    Crossmatch Result      Compatible Performed at [facility], [address] [place], [place] [address]   Troponin I (High Sensitivity)     Status: None   Collection Time: 04/11/23 11:28 AM  Result Value Ref Range   Troponin I (High Sensitivity) 12 <18 ng/L    Comment: (NOTE) Elevated high sensitivity troponin I (hsTnI) values and significant  changes across serial measurements may suggest ACS but many other  chronic and acute conditions are known to elevate hsTnI results.  Refer to the "Links" section for chest pain algorithms and additional  guidance. Performed at [facility], [address], [place], [place] [address]   POC occult blood, ED Provider will collect     Status: None   Collection Time: 04/11/23 11:30 AM  Result Value Ref Range   Positive    Prepare RBC     Status: None   Collection Time: 04/11/23 11:38 AM  Result Value Ref Range   Order Confirmation      ORDER PROCESSED BY BLOOD BANK Performed at [facility], [address], [place], [place] [address]   Urinalysis,  w/ Reflex to Culture (Infection Suspected) -Urine, Clean Catch     Status: Abnormal   Collection Time: 04/11/23 12:01 PM  Result Value Ref Range   Specimen Source URINE, CLEAN CATCH    Color, Urine RED (A) YELLOW    Comment: BIOCHEMICALS MAY BE AFFECTED BY COLOR   APPearance TURBID (A) CLEAR   Specific Gravity, Urine  1.005 - 1.030    TEST NOT REPORTED DUE TO COLOR INTERFERENCE OF URINE PIGMENT   pH  5.0 - 8.0    TEST NOT REPORTED DUE TO COLOR INTERFERENCE OF URINE PIGMENT   Glucose, UA (A) NEGATIVE mg/dL    TEST NOT REPORTED DUE TO COLOR INTERFERENCE OF URINE PIGMENT   Hgb urine dipstick NEGATIVE NEGATIVE   Bilirubin Urine (A) NEGATIVE    TEST NOT REPORTED DUE TO  COLOR INTERFERENCE OF URINE PIGMENT   Ketones, ur (A) NEGATIVE mg/dL    TEST NOT REPORTED DUE TO COLOR INTERFERENCE OF URINE PIGMENT   Protein, ur NEGATIVE NEGATIVE mg/dL   Nitrite (A) NEGATIVE    TEST NOT REPORTED DUE TO COLOR INTERFERENCE OF URINE PIGMENT   Leukocytes,Ua (A) NEGATIVE    TEST NOT REPORTED DUE TO COLOR INTERFERENCE OF URINE PIGMENT   Squamous Epithelial / HPF 0-5 0 - 5 /HPF   WBC, UA 0-5 0 - 5 WBC/hpf    Comment: Reflex urine culture not performed if WBC <=10, OR if Squamous epithelial cells >5. If Squamous epithelial cells >5, suggest recollection.   RBC / HPF >50 0 - 5 RBC/hpf   Bacteria, UA NONE SEEN NONE SEEN    Comment: Performed at [facility], [address] [place], [place] [address]  Resp panel by RT-PCR (RSV, Flu A&B, Covid) Anterior Nasal Swab     Status: None   Collection Time: 04/11/23 12:07 PM   Specimen: Anterior Nasal Swab  Result Value Ref Range   SARS Coronavirus 2 by RT PCR NEGATIVE NEGATIVE    Comment: (NOTE) SARS-CoV-2 target nucleic acids are NOT DETECTED.  The SARS-CoV-2 RNA is generally detectable in upper respiratory specimens during the acute phase of infection. The lowest concentration of SARS-CoV-2 viral copies this assay can detect is 138 copies/mL. A negative result does not preclude SARS-Cov-2 infection and should not be used as the sole basis for treatment or other patient management decisions. A negative result may occur with  improper specimen collection/handling, submission of specimen other than nasopharyngeal swab, presence of viral mutation(s) within the areas targeted by this assay, and inadequate number of viral copies(<138 copies/mL). A negative result must be combined with clinical observations, patient history, and epidemiological information. The expected result is Negative.  Fact Sheet for Patients:  BloggerCourse.com  Fact Sheet for Healthcare Providers:   SeriousBroker.it  This test is no t yet approved or cleared by the [place] FDA and  has been authorized for detection and/or diagnosis of SARS-CoV-2 by FDA under an Emergency Use Authorization (EUA). This EUA will remain  in effect (meaning this test can be used) for the duration of the COVID-19 declaration under Section 564(b)(1) of the Act, 21 U.S.C.section 360bbb-3(b)(1), unless the authorization is terminated  or revoked sooner.       Influenza A by PCR NEGATIVE NEGATIVE   Influenza B by PCR NEGATIVE NEGATIVE    Comment: (NOTE) The Xpert Xpress SARS-CoV-2/FLU/RSV plus assay is intended as an aid in the diagnosis of influenza from Nasopharyngeal swab specimens and should not be used as a sole basis for treatment. Nasal  washings and aspirates are unacceptable for Xpert Xpress SARS-CoV-2/FLU/RSV testing.  Fact Sheet for Patients: BloggerCourse.com  Fact Sheet for Healthcare Providers: SeriousBroker.it  This test is not yet approved or cleared by the [place] FDA and has been authorized for detection and/or diagnosis of SARS-CoV-2 by FDA under an Emergency Use Authorization (EUA). This EUA will remain in effect (meaning this test can be used) for the duration of the COVID-19 declaration under Section 564(b)(1) of the Act, 21 U.S.C. section 360bbb-3(b)(1), unless the authorization is terminated or revoked.     Resp Syncytial Virus by PCR NEGATIVE NEGATIVE    Comment: (NOTE) Fact Sheet for Patients: BloggerCourse.com  Fact Sheet for Healthcare Providers: SeriousBroker.it  This test is not yet approved or cleared by the [place] FDA and has been authorized for detection and/or diagnosis of SARS-CoV-2 by FDA under an Emergency Use Authorization (EUA). This EUA will remain in effect (meaning this test can be used) for the duration of  the COVID-19 declaration under Section 564(b)(1) of the Act, 21 U.S.C. section 360bbb-3(b)(1), unless the authorization is terminated or revoked.  Performed at [facility], [address] [place], [place] [address]   ABO/Rh     Status: None   Collection Time: 04/11/23 12:57 PM  Result Value Ref Range   ABO/RH(D)      A POS Performed at [facility], [address] [place], [place] [address]   Troponin I (High Sensitivity)     Status: Abnormal   Collection Time: 04/11/23 12:57 PM  Result Value Ref Range   Troponin I (High Sensitivity) 30 (H) <18 ng/L    Comment: (NOTE) Elevated high sensitivity troponin I (hsTnI) values and significant  changes across serial measurements may suggest ACS but many other  chronic and acute conditions are known to elevate hsTnI results.  Refer to the "Links" section for chest pain algorithms and additional  guidance. Performed at [facility], [address], [place], [place] [address]   Lactic acid, plasma     Status: Abnormal   Collection Time: 04/11/23 12:58 PM  Result Value Ref Range   Lactic Acid, Venous 6.2 (HH) 0.5 - 1.9 mmol/L    Comment: CRITICAL RESULT CALLED TO, READ BACK BY AND VERIFIED WITH MATTHEW SMALLWOOD @ 1344 ON 04/11/23 Riesa Pope Performed at [facility], [address], [place], [place] [address]    DG Chest Port 1 View  Result Date: 04/11/2023 CLINICAL DATA:  Altered mental status. EXAM: PORTABLE CHEST 1 VIEW COMPARISON:  Chest radiographs 01/24/2023. FINDINGS: Rotated patient. Clear lungs. Stable cardiac and mediastinal contours. No pleural effusion or pneumothorax. Visualized bones and upper abdomen are unremarkable. IMPRESSION: No evidence of acute cardiopulmonary disease. Electronically Signed   By: Orvan Falconer M.D.   On: 04/11/2023 14:12    Pending Labs Unresulted Labs (From admission, onward)     Start     Ordered   04/12/23 0500  Basic metabolic panel  Daily,   R      04/11/23 1807   04/12/23 0500  Magnesium   Tomorrow morning,   R        04/11/23 1807   04/12/23 0500  CBC  Daily,   R      04/11/23 1807   04/11/23 2100  Lactic acid, plasma  Once,   R        04/11/23 2100   04/11/23 1111  Blood Culture (routine x 2)  (Septic presentation on arrival (screening labs, nursing and treatment orders for obvious  sepsis))  BLOOD CULTURE X 2,   STAT      04/11/23 1111            Vitals/Pain Today's Vitals   04/11/23 1630 04/11/23 1700 04/11/23 1715 04/11/23 1755  BP: (!) 121/91 (!) 141/91 139/89 (!) 135/95  Pulse: 98 (!) 101 99 98  Resp: (!) 23 16 17 17   Temp: 98.6 F (37 C) 98.8 F (37.1 C) 99 F (37.2 C) 99.1 F (37.3 C)  TempSrc:      SpO2: 100% 100% 100% 96%  Weight:      Height:        Isolation Precautions No active isolations  Medications Medications  cefTRIAXone (ROCEPHIN) 2 g in sodium chloride 0.9 % 100 mL IVPB (0 g Intravenous Stopped 04/11/23 1210)  atorvastatin (LIPITOR) tablet 40 mg (has no administration in time range)  donepezil (ARICEPT) tablet 10 mg (has no administration in time range)  sertraline (ZOLOFT) tablet 100 mg (has no administration in time range)  levothyroxine (SYNTHROID) tablet 25 mcg (has no administration in time range)  mirabegron ER ([name]) tablet 50 mg (has no administration in time range)  melatonin tablet 3 mg (has no administration in time range)  levETIRAcetam (KEPPRA) tablet 750 mg (has no administration in time range)  insulin aspart (novoLOG) injection 0-6 Units (has no administration in time range)  sodium chloride flush (NS) 0.9 % injection 3 mL (has no administration in time range)  sodium chloride flush (NS) 0.9 % injection 3 mL (has no administration in time range)  0.9 %  sodium chloride infusion (has no administration in time range)  acetaminophen (TYLENOL) tablet 650 mg (has no administration in time range)    Or  acetaminophen (TYLENOL) suppository 650 mg (has no administration in time range)  HYDROcodone-acetaminophen  (NORCO/VICODIN) 5-325 MG per tablet 1 tablet (has no administration in time range)  fentaNYL (SUBLIMAZE) injection 12.5 mcg (has no administration in time range)  bisacodyl (DULCOLAX) EC tablet 5 mg (has no administration in time range)  ondansetron (ZOFRAN) tablet 4 mg (has no administration in time range)    Or  ondansetron (ZOFRAN) injection 4 mg (has no administration in time range)  albuterol (PROVENTIL) (2.5 MG/3ML) 0.083% nebulizer solution 2.5 mg (has no administration in time range)  guaiFENesin (MUCINEX) 12 hr tablet 600 mg (has no administration in time range)  dextromethorphan (DELSYM) 30 MG/5ML liquid 30 mg (has no administration in time range)  sodium chloride 0.9 % bolus 2,000 mL (0 mLs Intravenous Stopped 04/11/23 1238)  pantoprazole (PROTONIX) injection 40 mg (40 mg Intravenous Given 04/11/23 1134)  0.9 %  sodium chloride infusion (Manually program via Guardrails IV Fluids) ( Intravenous New Bag/Given 04/11/23 1130)    Mobility non-ambulatory     Focused Assessments    R Recommendations: See Admitting Provider Note  Report given to:   Additional Notes:  Pt from [facility] nursing facility. Pt came in for hematuria. Pt has hx of the same. Pt has temp foley placed. Pt has had 2 units of blood, with 3 infusing now. Pt is a&ox1 and that is baseline. Pt has dementia. Pt bed bound. Pts husband number in chart.

## 2023-04-11 NOTE — Hospital Course (Addendum)
77 year old female with past medical history of congenital hydrocephalus, epilepsy, Alzheimer's disease, asymmetrical hearing loss, hypothyroidism, hypertension, hyperlipidemia, type 2 diabetes mellitus with peripheral neuropathy, gait instability, overactive bladder, Wolf Parkinson White syndrome presented to the emergency department by [facility] from [facility] with altered mentation.  The patient has recently been discharged on 04/07/2023 from [facility] after being transferred there from [facility] ED with a presentation of gross hematuria.  Her CT imaging at that time showed circumferential irregular mural thickening of the urinary bladder with mucosal hyperenhancement, suspicious for cystitis as well as intraluminal layering corresponding to blood products.  She had a subsequent CT urogram suspicious for underlying neoplastic disease.  She was admitted to the urology service.  She was treated with continuous bladder irrigation but continued to have hematuria.  She was taken to the OR for cystoscopy on 04/06/2023 for clot evacuation irrigation and no discrete bladder masses were found.  Foley catheter was left in place at discharge and her postop course was uncomplicated.  She was discharged back to [facility] on aspirin and Plavix.  At some point her foley had been removed after she returned to [facility] because she was sent to ED with complaint of blood in her diaper and weakness.    Today EMS found the patient to be severely hypotensive with a blood pressure of 75/56, CBG of 264.  She was started on epinephrine infusion and route.  She was noted to be pale.  Her labs today showed a hemoglobin of 6.8.  Lactic acid was 8.0.  Her potassium was 3.0 and glucose 245.  Patient was bolused with 2 L normal saline and blood pressure improved to 110 systolic.  She was typed and crossed and ordered to receive 3 units PRBCs.  Dr. Estell Harpin consulted with urology at [facility] and  they declined to accept patient back and recommended that she be admitted locally and they believed she was bleeding from aspirin and Plavix.  Dr. Estell Harpin also consulted with urologist at [facility] Dr. Berneice Heinrich who felt that patient should be admitted to [facility] for urology consultation if needed.

## 2023-04-11 NOTE — Sepsis Progress Note (Signed)
Sepsis protocol monitored by eLink ?

## 2023-04-11 NOTE — ED Provider Notes (Signed)
[place] EMERGENCY DEPARTMENT AT [facility] Provider Note   CSN: [id number] Arrival date & time: 04/11/23  1057     History {Add pertinent medical, surgical, social history, OB history to HPI:1} Chief Complaint  Patient presents with   Vaginal Bleeding    Leah Payne is a 77 y.o. female.  Patient has a history of dementia and CVA she was recently admitted to [facility] for hematuria with a large clot in her bladder.  She had cystoscopy and irrigation.  No tumors were seen.  Patient was sent to the emergency department for having blood in her diaper and weakness.  The history is provided by the nursing home and medical records. No language interpreter was used.  Weakness Severity:  Severe Onset quality:  Sudden Timing:  Constant Progression:  Worsening Chronicity:  New Relieved by:  None tried Worsened by:  Nothing Ineffective treatments:  None tried Associated symptoms: no abdominal pain        Home Medications Prior to Admission medications   Medication Sig Start Date End Date Taking? Authorizing Provider  Accu-Chek FastClix Lancets MISC Use as instructed to check blood sugar once daily 09/06/19   Eustaquio Boyden, MD  Alcohol Swabs (B-D SINGLE USE SWABS REGULAR) PADS Use as instructed to check blood sugar once daily 09/06/19   Eustaquio Boyden, MD  aspirin 81 MG chewable tablet Chew 1 tablet (81 mg total) by mouth daily. 02/21/23   Azucena Fallen, MD  atorvastatin (LIPITOR) 40 MG tablet TAKE (1) TABLET BY MOUTH EVERY EVENING. Patient taking differently: Take 40 mg by mouth daily. 06/21/22   Babs Sciara, MD  Blood Glucose Monitoring Suppl (ACCU-CHEK GUIDE) w/Device KIT 1 each by Does not apply route as directed. Use as instructed to check blood sugar once daily 09/06/19   Eustaquio Boyden, MD  Cholecalciferol (VITAMIN D) 50 MCG (2000 UT) CAPS Take 1 capsule (2,000 Units total) by mouth daily. 02/02/19   Eustaquio Boyden, MD   clopidogrel (PLAVIX) 75 MG tablet Take 1 tablet (75 mg total) by mouth daily. 02/21/23   Azucena Fallen, MD  donepezil (ARICEPT) 10 MG tablet TAKE (1) TABLET BY MOUTH EVERY MORNING. Patient taking differently: Take 10 mg by mouth at bedtime. 06/21/22   Babs Sciara, MD  glucose blood (ACCU-CHEK GUIDE) test strip USE TO CHECK BLOOD SUGAR ONCE DAILY. 12/15/22   Tommie Sams, DO  levETIRAcetam (KEPPRA) 750 MG tablet Take 1 tablet (750 mg total) by mouth 2 (two) times daily. 02/20/23   Azucena Fallen, MD  levothyroxine (SYNTHROID) 25 MCG tablet TAKE (1) TABLET BY MOUTH ONCE DAILY BEFORE BREAKFAST. Patient taking differently: Take 25 mcg by mouth daily before breakfast. 06/21/22   Babs Sciara, MD  Melatonin 10 MG TABS Take 5 mg by mouth at bedtime.    [provider]  [name] 50 MG TB24 tablet TAKE (1) TABLET BY MOUTH AT BEDTIME. Patient taking differently: Take 50 mg by mouth at bedtime. 06/21/22   Babs Sciara, MD  sertraline (ZOLOFT) 100 MG tablet TAKE (1) TABLET BY MOUTH EVERY MORNING. Patient taking differently: Take 100 mg by mouth daily. 06/21/22   Luking, Jonna Coup, MD  [name] 8 MG TB24 tablet TAKE (1) TABLET BY MOUTH ONCE DAILY. Patient taking differently: Take 8 mg by mouth daily. 06/21/22   Babs Sciara, MD      Allergies    Patient has no known allergies.    Review of Systems  Review of Systems  Unable to perform ROS: Dementia  Gastrointestinal:  Negative for abdominal pain.  Neurological:  Positive for weakness.    Physical Exam Updated Vital Signs BP (!) 98/56   Pulse (!) 113   Temp (!) 96.7 F (35.9 C)   Resp 18   Ht 5\' 2"  (1.575 m)   Wt 45.4 kg   BMI 18.29 kg/m  Physical Exam Vitals and nursing note reviewed.  Constitutional:      Appearance: She is well-developed.     Comments: Lethargic  HENT:     Head: Normocephalic.     Nose: Nose normal.  Eyes:     General: No scleral icterus.    Conjunctiva/sclera: Conjunctivae normal.   Neck:     Thyroid: No thyromegaly.  Cardiovascular:     Rate and Rhythm: Regular rhythm. Tachycardia present.     Heart sounds: No murmur heard.    No friction rub. No gallop.  Pulmonary:     Breath sounds: No stridor. No wheezing or rales.  Chest:     Chest wall: No tenderness.  Abdominal:     General: There is no distension.     Tenderness: There is no abdominal tenderness. There is no rebound.  Genitourinary:    Comments: Rectal exam Brown stool heme positive Musculoskeletal:        General: Normal range of motion.     Cervical back: Neck supple.  Lymphadenopathy:     Cervical: No cervical adenopathy.  Skin:    Findings: No erythema or rash.  Neurological:     Motor: No abnormal muscle tone.     Coordination: Coordination normal.     Comments: Oriented to person and place     ED Results / Procedures / Treatments   Labs (all labs ordered are listed, but only abnormal results are displayed) Labs Reviewed  LACTIC ACID, PLASMA - Abnormal; Notable for the following components:      Result Value   Lactic Acid, Venous 8.0 (*)    All other components within normal limits  LACTIC ACID, PLASMA - Abnormal; Notable for the following components:   Lactic Acid, Venous 6.2 (*)    All other components within normal limits  COMPREHENSIVE METABOLIC PANEL - Abnormal; Notable for the following components:   Potassium 3.0 (*)    CO2 21 (*)    Glucose, Bld 245 (*)    Calcium 7.8 (*)    Total Protein 4.6 (*)    Albumin 2.1 (*)    Anion gap 17 (*)    All other components within normal limits  CBC WITH DIFFERENTIAL/PLATELET - Abnormal; Notable for the following components:   RBC 2.25 (*)    Hemoglobin 6.8 (*)    HCT 21.0 (*)    Abs Immature Granulocytes 0.08 (*)    All other components within normal limits  PROTIME-INR - Abnormal; Notable for the following components:   Prothrombin Time 15.6 (*)    All other components within normal limits  URINALYSIS, W/ REFLEX TO CULTURE  (INFECTION SUSPECTED) - Abnormal; Notable for the following components:   Color, Urine RED (*)    APPearance TURBID (*)    Glucose, UA   (*)    Value: TEST NOT REPORTED DUE TO COLOR INTERFERENCE OF URINE PIGMENT   Bilirubin Urine   (*)    Value: TEST NOT REPORTED DUE TO COLOR INTERFERENCE OF URINE PIGMENT   Ketones, ur   (*)    Value: TEST NOT REPORTED DUE TO  COLOR INTERFERENCE OF URINE PIGMENT   Nitrite   (*)    Value: TEST NOT REPORTED DUE TO COLOR INTERFERENCE OF URINE PIGMENT   Leukocytes,Ua   (*)    Value: TEST NOT REPORTED DUE TO COLOR INTERFERENCE OF URINE PIGMENT   All other components within normal limits  I-STAT CHEM 8, ED - Abnormal; Notable for the following components:   Potassium 3.2 (*)    Glucose, Bld 244 (*)    Calcium, Ion 1.06 (*)    TCO2 20 (*)    Hemoglobin 6.8 (*)    HCT 20.0 (*)    All other components within normal limits  RESP PANEL BY RT-PCR (RSV, FLU A&B, COVID)  RVPGX2  CULTURE, BLOOD (ROUTINE X 2)  APTT  POC OCCULT BLOOD, ED  TYPE AND SCREEN  PREPARE RBC (CROSSMATCH)  ABO/RH  TROPONIN I (HIGH SENSITIVITY)  TROPONIN I (HIGH SENSITIVITY)    EKG None  Radiology No results found.  Procedures Procedures  {Document cardiac monitor, telemetry assessment procedure when appropriate:1}  Medications Ordered in ED Medications  cefTRIAXone (ROCEPHIN) 2 g in sodium chloride 0.9 % 100 mL IVPB (0 g Intravenous Stopped 04/11/23 1210)  sodium chloride 0.9 % bolus 2,000 mL (0 mLs Intravenous Stopped 04/11/23 1238)  pantoprazole (PROTONIX) injection 40 mg (40 mg Intravenous Given 04/11/23 1134)  0.9 %  sodium chloride infusion (Manually program via Guardrails IV Fluids) ( Intravenous New Bag/Given 04/11/23 1130)    ED Course/ Medical Decision Making/ A&P   {  CRITICAL CARE Performed by: Bethann Berkshire Total critical care time: 65 minutes Critical care time was exclusive of separately billable procedures and treating other patients. Critical care was  necessary to treat or prevent imminent or life-threatening deterioration. Critical care was time spent personally by me on the following activities: development of treatment plan with patient and/or surrogate as well as nursing, discussions with consultants, evaluation of patient's response to treatment, examination of patient, obtaining history from patient or surrogate, ordering and performing treatments and interventions, ordering and review of laboratory studies, ordering and review of radiographic studies, pulse oximetry and re-evaluation of patient's condition.    Patient presented with blood in her diaper and hypotension.  Her pressure was in the 60s.  Patient was resuscitated with 2 L of normal saline and eventually will receive 3 units of blood.  Her blood pressure responded and systolic was 110.  We placed a Foley catheter that was draining pure blood.  I spoke with urology at [facility] and they believe the bleeding is from a Plavix and aspirin that was started after she left the hospital again.  They did not accept the patient in transfer.  I spoke with Dr. Urban Gibson urologist and with the long hospital and he felt like the hospitalist should admit the patient to [facility] and they can be consulted if needed. Click here for ABCD2, HEART and other calculatorsREFRESH Note before signing :1}                              Medical Decision Making Amount and/or Complexity of Data Reviewed Labs: ordered. Radiology: ordered. ECG/medicine tests: ordered.  Risk Prescription drug management. Decision regarding hospitalization.   Severe hematuria with hypotension.  Patient is being admitted to [facility] long progressive  {Document critical care time when appropriate:1} {Document review of labs and clinical decision tools ie heart score, Chads2Vasc2 etc:1}  {Document your independent review of radiology images, and  any outside records:1} {Document your discussion with family members, caretakers, and  with consultants:1} {Document social determinants of health affecting pt's care:1} {Document your decision making why or why not admission, treatments were needed:1} Final Clinical Impression(s) / ED Diagnoses Final diagnoses:  None    Rx / DC Orders ED Discharge Orders     None

## 2023-04-11 NOTE — ED Triage Notes (Signed)
Pt brought in from [facility] by [employer]. When EMS arrived BP was 75/56, CBG 264. Pt received 50 cc of NS and EPI gtt started at 4 mcg/min with EMS. BP upon arrival 62/42, HR 119. Pt is lethargic but alert to verbal stimulation, skin color very pale

## 2023-04-11 NOTE — H&P (Addendum)
History and Physical  [facility]  Leah Payne ZOX:[id number] DOB: [birth year] DOA: 04/11/2023  PCP: Dennard Nip, NP  Patient coming from: Home by [facility]  Level of care: Stepdown  I have personally briefly reviewed patient's old medical records in [facility]  Chief Complaint: blood in diaper and weakness   HPI: KIRRA HEATER is a 77 year old female with past medical history of congenital hydrocephalus, epilepsy, Alzheimer's disease, asymmetrical hearing loss, hypothyroidism, hypertension, hyperlipidemia, type 2 diabetes mellitus with peripheral neuropathy, gait instability, overactive bladder, Wolf Parkinson White syndrome presented to the emergency department by [facility] from [facility] with altered mentation.  The patient has recently been discharged on 04/07/2023 from [facility] after being transferred there from [facility] ED with a presentation of gross hematuria.  Her CT imaging at that time showed circumferential irregular mural thickening of the urinary bladder with mucosal hyperenhancement, suspicious for cystitis as well as intraluminal layering corresponding to blood products.  She had a subsequent CT urogram suspicious for underlying neoplastic disease.  She was admitted to the urology service.  She was treated with continuous bladder irrigation but continued to have hematuria.  She was taken to the OR for cystoscopy on 04/06/2023 for clot evacuation irrigation and no discrete bladder masses were found.  Foley catheter was left in place at discharge and her postop course was uncomplicated.  She was discharged back to [facility] on aspirin and Plavix.  At some point her foley had been removed after she returned to [facility] because she was sent to ED with complaint of blood in her diaper and weakness.    Today EMS found the patient to be severely hypotensive with a blood pressure of 75/56, CBG of 264.  She was started on epinephrine  infusion and route.  She was noted to be pale.  Her labs today showed a hemoglobin of 6.8.  Lactic acid was 8.0.  Her potassium was 3.0 and glucose 245.  Patient was bolused with 2 L normal saline and blood pressure improved to 110 systolic.  She was typed and crossed and ordered to receive 3 units PRBCs.  Dr. Estell Harpin consulted with urology at [facility] and they declined to accept patient back and recommended that she be admitted locally and they believed she was bleeding from aspirin and Plavix.  Dr. Estell Harpin also consulted with urologist at [facility] Dr. Berneice Heinrich who felt that patient should be admitted to [facility] for urology consultation if needed.      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 She has never used smokeless tobacco. She reports that she does not drink alcohol and does not use drugs.  No Known Allergies  Family History  Problem Relation Age of Onset   Stroke Mother 89   Pneumonia Mother    CAD Father 64   Alzheimer's disease Father    Pneumonia Father    Cancer Maternal Aunt        lung  (non-smoker)   Diabetes Neg Hx     Prior to Admission medications   Medication Sig Start Date End Date Taking? Authorizing Provider  aspirin EC 81 MG tablet Take 81 mg by mouth daily.   Yes [provider]  atorvastatin (LIPITOR) 40 MG tablet TAKE (1) TABLET BY MOUTH EVERY EVENING. 06/21/22  Yes Luking, Jonna Coup, MD  Cholecalciferol (VITAMIN D) 50 MCG (2000 UT) CAPS Take 1 capsule (2,000 Units total) by mouth daily. 02/02/19  Yes Eustaquio Boyden, MD  clopidogrel (PLAVIX) 75 MG tablet Take 1 tablet (75 mg total) by mouth daily. Patient taking differently: Take 75 mg by mouth every evening. 02/21/23  Yes Azucena Fallen, MD  donepezil (ARICEPT) 10 MG tablet TAKE (1) TABLET BY MOUTH EVERY MORNING. Patient taking differently: Take 10 mg by mouth at bedtime. 06/21/22  Yes Babs Sciara, MD  levETIRAcetam (KEPPRA) 750 MG tablet Take 1 tablet (750 mg total) by mouth 2 (two) times daily. 02/20/23  Yes Azucena Fallen, MD  levothyroxine (SYNTHROID) 25 MCG tablet TAKE (1) TABLET BY MOUTH ONCE DAILY BEFORE BREAKFAST. Patient taking differently: Take 25 mcg by mouth daily before breakfast. 06/21/22  Yes Luking, Jonna Coup, MD  magnesium oxide (MAG-OX) 400 (240 Mg) MG tablet Take 400 mg by mouth daily.   Yes [provider]  melatonin 5 MG TABS Take 5 mg by mouth at bedtime.   Yes [provider]  [name] 50 MG TB24 tablet TAKE (1) TABLET BY MOUTH AT BEDTIME. Patient taking differently: Take 50 mg by mouth daily. 06/21/22  Yes Luking, Scott A, MD  sertraline (ZOLOFT) 100 MG tablet TAKE (1) TABLET BY MOUTH EVERY MORNING. 06/21/22  Yes Luking, Jonna Coup, MD  [name] 8 MG TB24 tablet TAKE (1) TABLET BY MOUTH ONCE DAILY. 06/21/22  Yes Babs Sciara, MD  Accu-Chek FastClix Lancets MISC Use as instructed to check blood sugar once daily 09/06/19   Eustaquio Boyden, MD  Alcohol Swabs (B-D SINGLE USE SWABS REGULAR) PADS Use as instructed to check blood sugar once daily 09/06/19    Eustaquio Boyden, MD  Blood Glucose Monitoring Suppl (ACCU-CHEK GUIDE) w/Device KIT 1 each by Does not apply route as directed. Use as instructed to check blood sugar once daily 09/06/19   Eustaquio Boyden, MD  glucose blood (ACCU-CHEK GUIDE) test strip USE TO CHECK BLOOD SUGAR ONCE DAILY. 12/15/22   Tommie Sams, DO    Physical Exam: Vitals:   04/11/23 1330 04/11/23 1500 04/11/23 1515 04/11/23 1530  BP: (!) 98/56 100/74 108/76 107/83  Pulse:      Resp: 18 18 18 17   Temp: (!) 96.7 F (35.9 C) 98.2 F (36.8 C) 98.3 F (36.8 C) 98.4 F (36.9 C)  TempSrc:   Bladder Bladder  SpO2:   99% 98%  Weight:      Height:        Constitutional: pt appears chronically ill, pale, with some disorientation; able to make needs known.  Eyes: PERRL, lids and conjunctivae normal ENMT: Mucous membranes are pale and  dry. Posterior pharynx clear of any exudate or lesions. Neck: normal, supple, no masses, no thyromegaly Respiratory: clear to auscultation bilaterally, no wheezing, no crackles. Normal respiratory effort. No accessory muscle use.  Cardiovascular: tachycardic rate; normal s1, s2 sounds, no murmurs / rubs / gallops. No extremity edema. 2+ pedal pulses. No carotid bruits.  Abdomen: no tenderness, no masses palpated. No hepatosplenomegaly. Bowel sounds positive.  GU: indwelling foley catheter with gross blood seen;  Musculoskeletal: no clubbing / cyanosis. No joint deformity upper and lower extremities. Good ROM, no contractures. Normal muscle tone.  Skin: pale; no rashes, lesions, ulcers. No induration Neurologic: CN 2-12 grossly intact. Sensation intact, DTR normal. Strength 5/5 in all 4.  Psychiatric: diminished judgment and insight. Alert and oriented to person/place only. Normal mood.   Labs on Admission: I have personally reviewed following labs and imaging studies  CBC: Recent Labs  Lab 04/11/23 1122 04/11/23 1128  WBC  --  7.0  NEUTROABS  --  3.6  HGB 6.8* 6.8*  HCT 20.0* 21.0*   MCV  --  93.3  PLT  --  351   Basic Metabolic Panel: Recent Labs  Lab 04/11/23 1122 04/11/23 1128  NA 138 137  K 3.2* 3.0*  CL 100 99  CO2  --  21*  GLUCOSE 244* 245*  BUN 10 12  CREATININE 0.80 0.94  CALCIUM  --  7.8*   GFR: Estimated Creatinine Clearance: 35.9 mL/min (by C-G formula based on SCr of 0.94 mg/dL). Liver Function Tests: Recent Labs  Lab 04/11/23 1128  AST 38  ALT 13  ALKPHOS 51  BILITOT 0.4  PROT 4.6*  ALBUMIN 2.1*   No results for input(s): "LIPASE", "AMYLASE" in the last 168 hours. No results for input(s): "AMMONIA" in the last 168 hours. Coagulation Profile: Recent Labs  Lab 04/11/23 1128  INR 1.2   Cardiac Enzymes: No results for input(s): "CKTOTAL", "CKMB", "CKMBINDEX", "TROPONINI" in the last 168 hours. BNP (last 3 results) No results for input(s): "PROBNP" in the last 8760 hours. HbA1C: No results for input(s): "HGBA1C" in the last 72 hours. CBG: No results for input(s): "GLUCAP" in the last 168 hours. Lipid Profile: No results for input(s): "CHOL", "HDL", "LDLCALC", "TRIG", "CHOLHDL", "LDLDIRECT" in the last 72 hours. Thyroid Function Tests: No results for input(s): "TSH", "T4TOTAL", "FREET4", "T3FREE", "THYROIDAB" in the last 72 hours. Anemia Panel: No results for input(s): "VITAMINB12", "FOLATE", "FERRITIN", "TIBC", "IRON", "RETICCTPCT" in the last 72 hours. Urine analysis:    Component Value Date/Time   COLORURINE RED (A) 04/11/2023 1201   APPEARANCEUR TURBID (A) 04/11/2023 1201   LABSPEC  04/11/2023 1201    TEST NOT REPORTED DUE TO COLOR INTERFERENCE OF URINE PIGMENT   PHURINE  04/11/2023 1201    TEST NOT REPORTED DUE TO COLOR INTERFERENCE OF URINE PIGMENT   GLUCOSEU (A) 04/11/2023 1201    TEST NOT REPORTED DUE TO COLOR INTERFERENCE OF URINE PIGMENT   HGBUR NEGATIVE 04/11/2023 1201   BILIRUBINUR (A) 04/11/2023 1201    TEST NOT REPORTED DUE TO COLOR INTERFERENCE OF URINE PIGMENT   BILIRUBINUR neg 03/07/2018 1020    KETONESUR (A) 04/11/2023 1201    TEST NOT REPORTED DUE TO COLOR INTERFERENCE OF URINE PIGMENT   PROTEINUR NEGATIVE 04/11/2023 1201   UROBILINOGEN 0.2 03/07/2018 1020   UROBILINOGEN 1.0 08/29/2014 1206   NITRITE (A) 04/11/2023 1201    TEST NOT REPORTED DUE TO COLOR INTERFERENCE OF URINE PIGMENT   LEUKOCYTESUR (A) 04/11/2023 1201    TEST NOT REPORTED  DUE TO COLOR INTERFERENCE OF URINE PIGMENT    Radiological Exams on Admission: DG Chest Port 1 View  Result Date: 04/11/2023 CLINICAL DATA:  Altered mental status. EXAM: PORTABLE CHEST 1 VIEW COMPARISON:  Chest radiographs 01/24/2023. FINDINGS: Rotated patient. Clear lungs. Stable cardiac and mediastinal contours. No pleural effusion or pneumothorax. Visualized bones and upper abdomen are unremarkable. IMPRESSION: No evidence of acute cardiopulmonary disease. Electronically Signed   By: Orvan Falconer M.D.   On: 04/11/2023 14:12    EKG: Independently reviewed. Sinus tachycardia   Assessment/Plan Principal Problem:   Hemorrhagic shock (HCC) Active Problems:   Seizure disorder (HCC)   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HCC)   Essential hypertension   Hyperlipidemia associated with type 2 diabetes mellitus (HCC)   Overactive bladder   Debility   Type 2 diabetes, controlled, 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HCC)   Hypothyroidism   WPW (Wolff-Parkinson-White syndrome)   Alzheimer disease (HCC)   Asymmetrical hearing loss   Generalized weakness   Hypokalemia   Gait instability   Major neurocognitive disorder (HCC)   Gross hematuria   Lactic acidosis   Acute blood loss anemia   Hyperglycemia   Hemorrhagic Shock - secondary to gross hematuria  - Hg down to 6.8 - responded favorably to fluid boluses - transfusing 3 units PRBC  - not currently requiring further pressor therapy - admit to stepdown ICU for closer monitoring   Gross Hematuria  - after discussing with urology at [facility] and with Dr. Berneice Heinrich at [facility] they feel this is secondary to  aspirin and plavix usage which has been discontinued  - continue supportive measures - monitoring closely - Dr. Berneice Heinrich requested patient admit to [facility] in case urology is needed - anticipating hematuria will improve as plavix/aspirin washout   Acute blood loss anemia - secondary to hemorrhagic cystitis  - transfusing 3 units PRBC - pt was empirically started on IV ceftriaxone in ED and will continue for now  Lactic Acidosis  - likely secondary to acute blood loss - Lactic acid trending down with fluid hydration   Type 2 DM with neuropathy and hyperglycemia - SSI coverage for now with frequent CBG monitoring   Epilepsy  - resume home levetiracetam 750 mg BID   Overactive Bladder - plan to resume fesoterodine ER 4 mg and mirabegron 50 mg   Alzheimers' Dementia - resume home donepezil 10 mg at bedtime   Hypothyroidism  - resume levothyroxine 25 mcg daily   History of CVA - HOLD ASA 81 mg and Clopidogrel 75 mg   Hyperlipidemia - resume atorvastatin 40 mg daily   Behavioral Health  - resume sertraline 100 mg daily   Critical Care Procedure Note Authorized and Performed by: Maryln Manuel MD  Total Critical Care time:  70 mins Due to a high probability of clinically significant, life threatening deterioration, the patient required my highest level of preparedness to intervene emergently and I personally spent this critical care time directly and personally managing the patient.  This critical care time included obtaining a history; examining the patient, pulse oximetry; ordering and review of studies; arranging urgent treatment with development of a management plan; evaluation of patient's response of treatment; frequent reassessment; and discussions with other providers.  This critical care time was performed to assess and manage the high probability of imminent and life threatening deterioration that could result in multi-organ failure.  It was exclusive of separately billable  procedures and treating other patients and teaching time.     DVT prophylaxis: SCDs  Code Status: DNR   Family Communication:   Disposition Plan: return to long term care at discharge [facility])  Consults called: urology Dr. Berneice Heinrich, urology at [facility] declined to take patient   Admission status: INP  Level of care: Stepdown Standley Dakins MD [facility] How to contact the [facility] Attending or Consulting provider 7A - 7P or covering provider during after hours 7P -7A, for this patient?  Check the care team in [facility] and look for a) attending/consulting [facility] provider listed and b) the [facility] team listed Log into [email or web address] and use [place]'s universal password to access. If you do not have the password, please contact the hospital operator. Locate the [facility] provider you are looking for under [facility] and page to a number that you can be directly reached. If you still have difficulty reaching the provider, please page the [facility] (Director on Call) for the Hospitalists listed on amion for assistance.   If 7PM-7AM, please contact night-coverage [email or web address] Password [facility]  04/11/2023, 3:46 PM

## 2023-04-12 DIAGNOSIS — I10 Essential (primary) hypertension: Secondary | ICD-10-CM

## 2023-04-12 DIAGNOSIS — E876 Hypokalemia: Secondary | ICD-10-CM

## 2023-04-12 DIAGNOSIS — Q039 Congenital hydrocephalus, unspecified: Secondary | ICD-10-CM

## 2023-04-12 DIAGNOSIS — R578 Other shock: Secondary | ICD-10-CM | POA: Diagnosis not present

## 2023-04-12 DIAGNOSIS — D62 Acute posthemorrhagic anemia: Secondary | ICD-10-CM | POA: Diagnosis not present

## 2023-04-12 LAB — GLUCOSE, CAPILLARY
Glucose-Capillary: 84 mg/dL (ref 70–99)
Glucose-Capillary: 102 mg/dL — ABNORMAL HIGH (ref 70–99)
Glucose-Capillary: 99 mg/dL (ref 70–99)
Glucose-Capillary: 82 mg/dL (ref 70–99)
Glucose-Capillary: 122 mg/dL — ABNORMAL HIGH (ref 70–99)

## 2023-04-12 LAB — MAGNESIUM: Magnesium: 2.1 mg/dL (ref 1.7–2.4)

## 2023-04-12 LAB — BASIC METABOLIC PANEL WITH GFR
Anion gap: 10 (ref 5–15)
BUN: 13 mg/dL (ref 8–23)
CO2: 23 mmol/L (ref 22–32)
Calcium: 7.9 mg/dL — ABNORMAL LOW (ref 8.9–10.3)
Chloride: 104 mmol/L (ref 98–111)
Creatinine, Ser: 0.63 mg/dL (ref 0.44–1.00)
GFR, Estimated: >60 mL/min (ref >60)
Glucose, Bld: 88 mg/dL (ref 70–99)
Potassium: 3.2 mmol/L — ABNORMAL LOW (ref 3.5–5.1)
Sodium: 137 mmol/L (ref 135–145)

## 2023-04-12 LAB — CBC
HCT: 39.8 % (ref 36.0–46.0)
Hemoglobin: 13.5 g/dL (ref 12.0–15.0)
MCH: 28.8 pg (ref 26.0–34.0)
MCHC: 33.9 g/dL (ref 30.0–36.0)
MCV: 85 fL (ref 80.0–100.0)
Platelets: 204 10*3/uL (ref 150–400)
RBC: 4.68 MIL/uL (ref 3.87–5.11)
RDW: 15.7 % — ABNORMAL HIGH (ref 11.5–15.5)
WBC: 9.7 10*3/uL (ref 4.0–10.5)
nRBC: 0 % (ref 0.0–0.2)

## 2023-04-12 LAB — MRSA NEXT GEN BY PCR, NASAL: MRSA by PCR Next Gen: NOT DETECTED

## 2023-04-12 MED ORDER — SODIUM CHLORIDE 0.9 % IV SOLN: INTRAVENOUS | Status: DC

## 2023-04-12 MED ORDER — ORAL CARE MOUTH RINSE
15.0000 mL | OROMUCOSAL | Status: DC
  Administered 2023-04-12 – 2023-04-15 (×8): 15 mL via OROMUCOSAL

## 2023-04-12 MED ORDER — ORAL CARE MOUTH RINSE: 15.0000 mL | OROMUCOSAL | Status: DC | PRN

## 2023-04-12 MED ORDER — SODIUM CHLORIDE 0.9 % IV SOLN: INTRAVENOUS | Status: AC

## 2023-04-12 MED ORDER — POTASSIUM CHLORIDE 20 MEQ PO PACK
40.0000 meq | PACK | Freq: Once | ORAL | Status: AC
  Administered 2023-04-12: 40 meq via ORAL
  Filled 2023-04-12: qty 2

## 2023-04-12 MED ORDER — CHLORHEXIDINE GLUCONATE CLOTH 2 % EX PADS
6.0000 | MEDICATED_PAD | Freq: Every day | CUTANEOUS | Status: DC
  Administered 2023-04-12 – 2023-04-15 (×5): 6 via TOPICAL

## 2023-04-12 NOTE — Plan of Care (Signed)
Problem: Pain Managment: Goal: General experience of comfort will improve Outcome: Progressing   Problem: Safety: Goal: Ability to remain free from injury will improve Outcome: Progressing   Problem: Elimination: Goal: Will not experience complications related to urinary retention Outcome: Not Progressing

## 2023-04-12 NOTE — Progress Notes (Signed)
[facility] PROGRESS NOTE    Progress Note  Leah Payne  ZOX:[id number] DOB: [birth year] DOA: 04/11/2023 PCP: Leah Nip, NP     Brief Narrative:   Leah Payne is an 77 y.o. female past medical history significant for congenital hydrocephalus, epilepsy Alzheimer's, essential hypertension hyperlipidemia type 2 diabetes mellitus with peripheral neuropathy gait instability and with Wolff-Parkinson-White syndrome syndrome comes from nursing facility into the emergency room for altered mental status, patient was recently discharged on 04/07/2023 a week for [facility] for hematuria, CT urogram was suspicious for neoplastic disease, was on continuous bladder irrigation cystoscopy on 04/06/2023 with clot evacuation as Foley catheter was left in place upon discharge on aspirin and Plavix at some point her Foley catheter had been removed was sent to the ED with complaints of bloody diaper EMS found her hypotensive she was started on pressors her labs on the day of admission showed a hemoglobin of 6.8 lactic acid of 8 potassium 3.0 glucose of 250 was fluids resuscitated transfused 3 units of packed red blood cells, the ED was unable to transfer to [facility] and they declined admission they recommended local admission with aspirin and Plavix, discussed with urology here Leah Payne long who recommended the patient admitted with urology consultation.    Assessment/Plan:   Hemorrhagic shock (HCC) secondary to gross hematuria in the setting of aspirin and Plavix. On admission hemoglobin of 6.8 she status post 3 units of packed red blood cells Vitals are stable not requiring pressors.  Gross hematuria: Likely secondary to aspirin and Plavix which has been discontinued. Urology feel the patient should be admitted to [facility] in case urology support is needed. Will need a washout period of aspirin and Plavix.  Will need 3 to 5 days.  Acute blood loss anemia: Secondary to  hemorrhagic cystitis status post 3 units of packed red blood cells she was started on IV Rocephin.  Lactic acidosis/metabolic acidosis high anion gap: Likely secondary to acute blood loss, she is getting 3 units of packed red blood cells then we will hydrate, monitor strict I's and O's and daily weights . Lactic acidosis has resolved.  Diabetes mellitus type 2: Continue sliding scale insulin for coverage.  Epilepsy: Continue levetiracetam 750 mg  Overactive bladder: Resume medication.  Alzheimer's dementia: Continue benazepril.  Hypothyroidism: Continue Synthroid.  History of CVA: Holding aspirin and Plavix.  Hyperlipidemia: Continue statins.  Behavioral issues: Continue sertraline.    Stage II sacral decubitus ulcer present on admission: RN Pressure Injury Documentation: Pressure Injury 07/29/19 Buttocks Left Stage 2 -  Partial thickness loss of dermis presenting as a shallow open injury with a red, pink wound bed without slough. (Active)  07/29/19 2201  Location: Buttocks  Location Orientation: Left  Staging: Stage 2 -  Partial thickness loss of dermis presenting as a shallow open injury with a red, pink wound bed without slough.  Wound Description (Comments):   Present on Admission:     DVT prophylaxis: scd Family Communication:one Status is: Inpatient Remains inpatient appropriate because: Hemorrhagic shock    Code Status:     Code Status Orders  (From admission, onward)           Start     Ordered   04/11/23 1805  Do not attempt resuscitation (DNR)- Limited -Do Not Intubate (DNI)  Continuous       Question Answer Comment  If pulseless and not breathing No CPR or chest compressions.   In Pre-Arrest Conditions (Patient Is Breathing and  Has A Pulse) Do not intubate. Provide all appropriate non-invasive medical interventions. Avoid ICU transfer unless indicated or required.   Consent: Discussion documented in EHR or advanced directives reviewed       04/11/23 1807           Code Status History     Date Active Date Inactive Code Status Order ID Comments User Context   02/15/2023 0443 02/21/2023 0011 DNR [id number]  Lilyan Gilford, DO Inpatient   02/15/2023 0124 02/15/2023 0443 Full Code [id number]  Lilyan Gilford, DO Inpatient   08/29/2014 1611 08/31/2014 1626 Full Code [id number]  Russella Dar, NP Inpatient   08/04/2013 1901 08/06/2013 1947 Full Code [id number]  Gale Journey, MD Inpatient         IV Access:   Peripheral IV   Procedures and diagnostic studies:   DG Chest Port 1 View  Result Date: 04/11/2023 CLINICAL DATA:  Altered mental status. EXAM: PORTABLE CHEST 1 VIEW COMPARISON:  Chest radiographs 01/24/2023. FINDINGS: Rotated patient. Clear lungs. Stable cardiac and mediastinal contours. No pleural effusion or pneumothorax. Visualized bones and upper abdomen are unremarkable. IMPRESSION: No evidence of acute cardiopulmonary disease. Electronically Signed   By: Orvan Falconer M.D.   On: 04/11/2023 14:12     Medical Consultants:   None.   Subjective:    Leah Payne no complaints  Objective:    Vitals:   04/12/23 0300 04/12/23 0400 04/12/23 0500 04/12/23 0600  BP: 123/77 (!) 141/78  (!) 140/103  Pulse: 93 90 88 100  Resp: 15 14 20 20   Temp: 99 F (37.2 C) 98.8 F (37.1 C) 99 F (37.2 C) 98.8 F (37.1 C)  TempSrc:      SpO2: 100% 100% 100% 100%  Weight:      Height:       SpO2: 100 %   Intake/Output Summary (Last 24 hours) at 04/12/2023 0714 Last data filed at 04/12/2023 0527 Gross per 24 hour  Intake 2912.26 ml  Output 500 ml  Net 2412.26 ml   Filed Weights   04/11/23 1115 04/12/23 0141  Weight: 45.4 kg 47.6 kg    Exam: General exam: In no acute distress. Respiratory system: Good air movement and clear to auscultation. Cardiovascular system: S1 & S2 heard, RRR. No JVD. Gastrointestinal system: Abdomen is nondistended, soft and nontender.  Extremities: No pedal  edema. Skin: No rashes, lesions or ulcers Psychiatry: Judgement and insight appear normal. Mood & affect appropriate.    Data Reviewed:    Labs: Basic Metabolic Panel: Recent Labs  Lab 04/11/23 1122 04/11/23 1128  NA 138 137  K 3.2* 3.0*  CL 100 99  CO2  --  21*  GLUCOSE 244* 245*  BUN 10 12  CREATININE 0.80 0.94  CALCIUM  --  7.8*   GFR Estimated Creatinine Clearance: 37.7 mL/min (by C-G formula based on SCr of 0.94 mg/dL). Liver Function Tests: Recent Labs  Lab 04/11/23 1128  AST 38  ALT 13  ALKPHOS 51  BILITOT 0.4  PROT 4.6*  ALBUMIN 2.1*   No results for input(s): "LIPASE", "AMYLASE" in the last 168 hours. No results for input(s): "AMMONIA" in the last 168 hours. Coagulation profile Recent Labs  Lab 04/11/23 1128  INR 1.2   COVID-19 Labs  No results for input(s): "DDIMER", "FERRITIN", "LDH", "CRP" in the last 72 hours.  Lab Results  Component Value Date   SARSCOV2NAA NEGATIVE 04/11/2023   SARSCOV2NAA NEGATIVE 01/24/2023   SARSCOV2NAA NEGATIVE 05/20/2021  SARSCOV2NAA NEGATIVE 07/29/2019    CBC: Recent Labs  Lab 04/11/23 1122 04/11/23 1128 04/11/23 2304  WBC  --  7.0  --   NEUTROABS  --  3.6  --   HGB 6.8* 6.8* 13.2  HCT 20.0* 21.0* 37.7  MCV  --  93.3  --   PLT  --  351  --    Cardiac Enzymes: No results for input(s): "CKTOTAL", "CKMB", "CKMBINDEX", "TROPONINI" in the last 168 hours. BNP (last 3 results) No results for input(s): "PROBNP" in the last 8760 hours. CBG: Recent Labs  Lab 04/11/23 2315 04/12/23 0332  GLUCAP 102* 84   D-Dimer: No results for input(s): "DDIMER" in the last 72 hours. Hgb A1c: No results for input(s): "HGBA1C" in the last 72 hours. Lipid Profile: No results for input(s): "CHOL", "HDL", "LDLCALC", "TRIG", "CHOLHDL", "LDLDIRECT" in the last 72 hours. Thyroid function studies: No results for input(s): "TSH", "T4TOTAL", "T3FREE", "THYROIDAB" in the last 72 hours.  Invalid input(s): "FREET3" Anemia work  up: No results for input(s): "VITAMINB12", "FOLATE", "FERRITIN", "TIBC", "IRON", "RETICCTPCT" in the last 72 hours. Sepsis Labs: Recent Labs  Lab 04/11/23 1128 04/11/23 1258 04/11/23 2304  WBC 7.0  --   --   LATICACIDVEN 8.0* 6.2* 1.1   Microbiology Recent Results (from the past 240 hour(s))  Resp panel by RT-PCR (RSV, Flu A&B, Covid) Anterior Nasal Swab     Status: None   Collection Time: 04/11/23 12:07 PM   Specimen: Anterior Nasal Swab  Result Value Ref Range Status   SARS Coronavirus 2 by RT PCR NEGATIVE NEGATIVE Final    Comment: (NOTE) SARS-CoV-2 target nucleic acids are NOT DETECTED.  The SARS-CoV-2 RNA is generally detectable in upper respiratory specimens during the acute phase of infection. The lowest concentration of SARS-CoV-2 viral copies this assay can detect is 138 copies/mL. A negative result does not preclude SARS-Cov-2 infection and should not be used as the sole basis for treatment or other patient management decisions. A negative result may occur with  improper specimen collection/handling, submission of specimen other than nasopharyngeal swab, presence of viral mutation(s) within the areas targeted by this assay, and inadequate number of viral copies(<138 copies/mL). A negative result must be combined with clinical observations, patient history, and epidemiological information. The expected result is Negative.  Fact Sheet for Patients:  BloggerCourse.com  Fact Sheet for Healthcare Providers:  SeriousBroker.it  This test is no t yet approved or cleared by the [place] FDA and  has been authorized for detection and/or diagnosis of SARS-CoV-2 by FDA under an Emergency Use Authorization (EUA). This EUA will remain  in effect (meaning this test can be used) for the duration of the COVID-19 declaration under Section 564(b)(1) of the Act, 21 U.S.C.section 360bbb-3(b)(1), unless the authorization is  terminated  or revoked sooner.       Influenza A by PCR NEGATIVE NEGATIVE Final   Influenza B by PCR NEGATIVE NEGATIVE Final    Comment: (NOTE) The Xpert Xpress SARS-CoV-2/FLU/RSV plus assay is intended as an aid in the diagnosis of influenza from Nasopharyngeal swab specimens and should not be used as a sole basis for treatment. Nasal washings and aspirates are unacceptable for Xpert Xpress SARS-CoV-2/FLU/RSV testing.  Fact Sheet for Patients: BloggerCourse.com  Fact Sheet for Healthcare Providers: SeriousBroker.it  This test is not yet approved or cleared by the [place] FDA and has been authorized for detection and/or diagnosis of SARS-CoV-2 by FDA under an Emergency Use Authorization (EUA). This EUA will remain in  effect (meaning this test can be used) for the duration of the COVID-19 declaration under Section 564(b)(1) of the Act, 21 U.S.C. section 360bbb-3(b)(1), unless the authorization is terminated or revoked.     Resp Syncytial Virus by PCR NEGATIVE NEGATIVE Final    Comment: (NOTE) Fact Sheet for Patients: BloggerCourse.com  Fact Sheet for Healthcare Providers: SeriousBroker.it  This test is not yet approved or cleared by the [place] FDA and has been authorized for detection and/or diagnosis of SARS-CoV-2 by FDA under an Emergency Use Authorization (EUA). This EUA will remain in effect (meaning this test can be used) for the duration of the COVID-19 declaration under Section 564(b)(1) of the Act, 21 U.S.C. section 360bbb-3(b)(1), unless the authorization is terminated or revoked.  Performed at [facility], [address] [place], [place] [address]   MRSA Next Gen by PCR, Nasal     Status: None   Collection Time: 04/12/23  1:46 AM   Specimen: Nasal Mucosa; Nasal Swab  Result Value Ref Range Status   MRSA by PCR Next Gen NOT DETECTED NOT DETECTED  Final    Comment: (NOTE) The GeneXpert MRSA Assay (FDA approved for NASAL specimens only), is one component of a comprehensive MRSA colonization surveillance program. It is not intended to diagnose MRSA infection nor to guide or monitor treatment for MRSA infections. Test performance is not FDA approved in patients less than 61 years old. Performed at [facility], [address] [place], [place] [address]      Medications:    atorvastatin  40 mg Oral QPM   Chlorhexidine Gluconate Cloth  6 each Topical Daily   donepezil  10 mg Oral QHS   insulin aspart  0-6 Units Subcutaneous TID WC   levETIRAcetam  750 mg Oral BID   levothyroxine  25 mcg Oral QAC breakfast   melatonin  3 mg Oral QHS   mirabegron ER  50 mg Oral Daily   potassium chloride  40 mEq Oral Once   sertraline  100 mg Oral BH-q7a   sodium chloride flush  3 mL Intravenous Q12H   Continuous Infusions:  sodium chloride     cefTRIAXone (ROCEPHIN)  IV Stopped (04/11/23 1210)      LOS: 1 day   Marinda Elk  [facility]  04/12/2023, 7:14 AM

## 2023-04-12 NOTE — Progress Notes (Signed)
Overnight   NAME: ARIBA DEJARNETTE MRN: [id number] DOB : January 06, 1946    Date of Service   04/12/2023   HPI/Events of Note    Notified by RN for paper notification of Potassium of 3.2 mmol/L (Epic lab interface down time )  Potassium replacement ordered    Interventions/ Plan   Potassium replacement ordered      Chinita Greenland BSN MSNA MSN ACNPC-AG Acute Care Nurse Practitioner [facility]

## 2023-04-13 DIAGNOSIS — R5381 Other malaise: Secondary | ICD-10-CM

## 2023-04-13 DIAGNOSIS — I456 Pre-excitation syndrome: Secondary | ICD-10-CM

## 2023-04-13 DIAGNOSIS — R578 Other shock: Secondary | ICD-10-CM | POA: Diagnosis not present

## 2023-04-13 DIAGNOSIS — R739 Hyperglycemia, unspecified: Secondary | ICD-10-CM

## 2023-04-13 DIAGNOSIS — F0390 Unspecified dementia without behavioral disturbance: Secondary | ICD-10-CM

## 2023-04-13 DIAGNOSIS — R531 Weakness: Secondary | ICD-10-CM

## 2023-04-13 DIAGNOSIS — N3281 Overactive bladder: Secondary | ICD-10-CM

## 2023-04-13 DIAGNOSIS — I10 Essential (primary) hypertension: Secondary | ICD-10-CM | POA: Diagnosis not present

## 2023-04-13 DIAGNOSIS — Q039 Congenital hydrocephalus, unspecified: Secondary | ICD-10-CM | POA: Diagnosis not present

## 2023-04-13 DIAGNOSIS — H918X3 Other specified hearing loss, bilateral: Secondary | ICD-10-CM

## 2023-04-13 LAB — BASIC METABOLIC PANEL
Anion gap: 7 (ref 5–15)
BUN: 10 mg/dL (ref 8–23)
CO2: 25 mmol/L (ref 22–32)
Calcium: 8 mg/dL — ABNORMAL LOW (ref 8.9–10.3)
Chloride: 109 mmol/L (ref 98–111)
Creatinine, Ser: 0.48 mg/dL (ref 0.44–1.00)
GFR, Estimated: >60 mL/min (ref >60)
Glucose, Bld: 101 mg/dL — ABNORMAL HIGH (ref 70–99)
Potassium: 4 mmol/L (ref 3.5–5.1)
Sodium: 141 mmol/L (ref 135–145)

## 2023-04-13 LAB — GLUCOSE, CAPILLARY
Glucose-Capillary: 71 mg/dL (ref 70–99)
Glucose-Capillary: 90 mg/dL (ref 70–99)
Glucose-Capillary: 81 mg/dL (ref 70–99)
Glucose-Capillary: 72 mg/dL (ref 70–99)
Glucose-Capillary: 70 mg/dL (ref 70–99)
Glucose-Capillary: 82 mg/dL (ref 70–99)

## 2023-04-13 LAB — CBC
HCT: 41 % (ref 36.0–46.0)
Hemoglobin: 13.6 g/dL (ref 12.0–15.0)
MCH: 29.2 pg (ref 26.0–34.0)
MCHC: 33.2 g/dL (ref 30.0–36.0)
MCV: 88 fL (ref 80.0–100.0)
Platelets: 224 10*3/uL (ref 150–400)
RBC: 4.66 MIL/uL (ref 3.87–5.11)
RDW: 16.6 % — ABNORMAL HIGH (ref 11.5–15.5)
WBC: 8.7 10*3/uL (ref 4.0–10.5)
nRBC: 0 % (ref 0.0–0.2)

## 2023-04-13 MED ORDER — SODIUM CHLORIDE 0.9 % IV SOLN
750.0000 mg | Freq: Two times a day (BID) | INTRAVENOUS | Status: DC
  Administered 2023-04-13 – 2023-04-14 (×4): 750 mg via INTRAVENOUS
  Filled 2023-04-13 (×5): qty 7.5

## 2023-04-13 NOTE — Progress Notes (Signed)
PROGRESS NOTE  Leah Payne:[id number] DOB: 1946-03-02   PCP: Dennard Nip, NP  Patient is from: Nursing home  DOA: 04/11/2023 LOS: 2  Chief complaints Chief Complaint  Patient presents with   Vaginal Bleeding     Brief Narrative / Interim history: 77 year old F with PMH of congenital hydrocephalus, seizure disorder, hematuria, overactive bladder, dementia, HTN, HLD, WPW syndrome and recent hospitalization at [facility] for hematuria for which he underwent CT urogram which was suspicious for neoplastic disease and was treated with CBI and discharged on 9/13 with Foley catheter and continued on home aspirin and Plavix, presented to [facility] with altered mental status and bloody diaper.  Per EMS, she was hypotensive and started on pressor.  Hemoglobin of 6.8.  Lactic acid 8.0.  K3.0.  Glucose 250.  She was resuscitated with IV fluid and 3 units of blood and admitted to ICU.  Reportedly, [facility] declined admission and she was admitted here after consultation with urology.  Hemoglobin improved to 13.2 and remained stable since then.  Still with gross hematuria but no blood clots.  She is also on IV ceftriaxone.  Discussed with on-call urology, Dr. Mena Goes on 9/19 who recommended continuing Foley catheter and outpatient follow-up.   Subjective: Seen and examined earlier this morning.  No major events overnight of this morning.  She has no complaints but not a great historian.  She is awake and oriented to self and place but not time or situation.  Has no recollection into why she is in the hospital.  Objective: Vitals:   04/13/23 0800 04/13/23 0900 04/13/23 1000 04/13/23 1200  BP: 130/78  139/76 132/72  Pulse: 73 71 74 71  Resp: 16 16 16 16   Temp: (!) 97.5 F (36.4 C) (!) 97.5 F (36.4 C) 97.9 F (36.6 C) 98.2 F (36.8 C)  TempSrc:      SpO2: 100% 100% 100% 99%  Weight:      Height:        Examination:  GENERAL: Appears frail.   Nontoxic. HEENT: MMM.  Vision and hearing grossly intact.  Hydrocephalus. NECK: Supple.  No apparent JVD.  RESP:  No IWOB.  Fair aeration bilaterally. CVS:  RRR. Heart sounds normal.  ABD/GI/GU: BS+. Abd soft, NTND.  Indwelling Foley.  Gross hematuria. MSK/EXT:  Moves extremities.  Significant muscle mass and subcu fat loss. SKIN: no apparent skin lesion or wound NEURO: Awake and oriented to self and place but not time or situation.  No apparent focal neuro deficit. PSYCH: Calm. Normal affect.   Procedures:  None  Microbiology summarized: COVID-19, influenza and RSV PCR nonreactive MRSA PCR screen nonreactive Blood cultures NGTD  Assessment and plan: Hemorrhagic shock  secondary to gross hematuria in the setting of aspirin and Plavix.  Still with gross hematuria but no blood clots.  Hemoglobin improving.  Renal function stable.  Hemodynamically stable. Recent Labs    01/24/23 1131 02/14/23 1531 02/15/23 0423 02/15/23 2041 02/18/23 0304 04/11/23 1122 04/11/23 1128 04/11/23 2304 04/12/23 0336 04/13/23 0430  HGB 12.2 12.6 11.4* 12.8 11.2* 6.8* 6.8* 13.2 13.5 13.6  -Hold aspirin and Plavix-for CVA?  Unusual to stay on DAPT indefinitely.  Will discontinue on discharge -Monitor hemoglobin daily -Urology, Dr. Mena Goes on 9/19 who recommended discharge with Foley and outpatient follow-up -Continue IV ceftriaxone -Transfer to MedSurg  History of congenital hydrocephalus/epilepsy: Stable Alzheimer's dementia without behavioral disturbance: Oriented to self and place. -Continue home Keppra and Aricept.  History of CVA-she was  on Plavix and aspirin.  It is unusual to continue DAPT indefinitely for CVA.  Given recurrent gross hematuria, will discontinue Plavix and aspirin indefinitely   Acute blood loss anemia: -As above  Hypothyroidism -Continue home Synthroid   Lactic acidosis/metabolic acidosis high anion gap: Resolved.   DM-2 ruled out.  Physical deconditioning: Unsure  about mobility status at baseline. -PT/OT eval  Hyperlipidemia: -Continue statins.   Mood disorder: -Continue home meds.  Body mass index is 21.21 kg/m.    DVT prophylaxis:  SCDs Start: 04/11/23 1803 SCDs Start: 04/11/23 1800  Code Status: DNR/DNI Family Communication: None at bedside. Level of care: Med-Surg Status is: Inpatient Remains inpatient appropriate because: Gross hematuria   Final disposition: SNF Consultants:  Urology   55 minutes with more than 50% spent in reviewing records, counseling patient/family and coordinating care.   Sch Meds:  Scheduled Meds:  atorvastatin  40 mg Oral QPM   Chlorhexidine Gluconate Cloth  6 each Topical Daily   donepezil  10 mg Oral QHS   insulin aspart  0-6 Units Subcutaneous TID WC   levETIRAcetam  750 mg Oral BID   levothyroxine  25 mcg Oral QAC breakfast   melatonin  3 mg Oral QHS   mirabegron ER  50 mg Oral Daily   mouth rinse  15 mL Mouth Rinse 4 times per day   sertraline  100 mg Oral BH-q7a   sodium chloride flush  3 mL Intravenous Q12H   Continuous Infusions:  sodium chloride     cefTRIAXone (ROCEPHIN)  IV Stopped (04/13/23 1147)   PRN Meds:.sodium chloride, acetaminophen **OR** acetaminophen, albuterol, bisacodyl, dextromethorphan, fentaNYL (SUBLIMAZE) injection, guaiFENesin, HYDROcodone-acetaminophen, ondansetron **OR** ondansetron (ZOFRAN) IV, mouth rinse, sodium chloride flush  Antimicrobials: Anti-infectives (From admission, onward)    Start     Dose/Rate Route Frequency Ordered Stop   04/11/23 1115  cefTRIAXone (ROCEPHIN) 2 g in sodium chloride 0.9 % 100 mL IVPB        2 g 200 mL/hr over 30 Minutes Intravenous Every 24 hours 04/11/23 1111 04/18/23 1114        I have personally reviewed the following labs and images: CBC: Recent Labs  Lab 04/11/23 1122 04/11/23 1128 04/11/23 2304 04/12/23 0336 04/13/23 0430  WBC  --  7.0  --  9.7 8.7  NEUTROABS  --  3.6  --   --   --   HGB 6.8* 6.8* 13.2  13.5 13.6  HCT 20.0* 21.0* 37.7 39.8 41.0  MCV  --  93.3  --  85.0 88.0  PLT  --  351  --  204 224   BMP &GFR Recent Labs  Lab 04/11/23 1122 04/11/23 1128 04/12/23 0336 04/13/23 0257  NA 138 137 137 141  K 3.2* 3.0* 3.2* 4.0  CL 100 99 104 109  CO2  --  21* 23 25  GLUCOSE 244* 245* 88 101*  BUN 10 12 13 10   CREATININE 0.80 0.94 0.63 0.48  CALCIUM  --  7.8* 7.9* 8.0*  MG  --   --  2.1  --    Estimated Creatinine Clearance: 45.8 mL/min (by C-G formula based on SCr of 0.48 mg/dL). Liver & Pancreas: Recent Labs  Lab 04/11/23 1128  AST 38  ALT 13  ALKPHOS 51  BILITOT 0.4  PROT 4.6*  ALBUMIN 2.1*   No results for input(s): "LIPASE", "AMYLASE" in the last 168 hours. No results for input(s): "AMMONIA" in the last 168 hours. Diabetic: No results for input(s): "HGBA1C" in the  last 72 hours. Recent Labs  Lab 04/12/23 2223 04/13/23 0316 04/13/23 0428 04/13/23 0828 04/13/23 1207  GLUCAP 122* 71 90 81 72   Cardiac Enzymes: No results for input(s): "CKTOTAL", "CKMB", "CKMBINDEX", "TROPONINI" in the last 168 hours. No results for input(s): "PROBNP" in the last 8760 hours. Coagulation Profile: Recent Labs  Lab 04/11/23 1128  INR 1.2   Thyroid Function Tests: No results for input(s): "TSH", "T4TOTAL", "FREET4", "T3FREE", "THYROIDAB" in the last 72 hours. Lipid Profile: No results for input(s): "CHOL", "HDL", "LDLCALC", "TRIG", "CHOLHDL", "LDLDIRECT" in the last 72 hours. Anemia Panel: No results for input(s): "VITAMINB12", "FOLATE", "FERRITIN", "TIBC", "IRON", "RETICCTPCT" in the last 72 hours. Urine analysis:    Component Value Date/Time   COLORURINE RED (A) 04/11/2023 1201   APPEARANCEUR TURBID (A) 04/11/2023 1201   LABSPEC  04/11/2023 1201    TEST NOT REPORTED DUE TO COLOR INTERFERENCE OF URINE PIGMENT   PHURINE  04/11/2023 1201    TEST NOT REPORTED DUE TO COLOR INTERFERENCE OF URINE PIGMENT   GLUCOSEU (A) 04/11/2023 1201    TEST NOT REPORTED DUE TO COLOR  INTERFERENCE OF URINE PIGMENT   HGBUR NEGATIVE 04/11/2023 1201   BILIRUBINUR (A) 04/11/2023 1201    TEST NOT REPORTED DUE TO COLOR INTERFERENCE OF URINE PIGMENT   BILIRUBINUR neg 03/07/2018 1020   KETONESUR (A) 04/11/2023 1201    TEST NOT REPORTED DUE TO COLOR INTERFERENCE OF URINE PIGMENT   PROTEINUR NEGATIVE 04/11/2023 1201   UROBILINOGEN 0.2 03/07/2018 1020   UROBILINOGEN 1.0 08/29/2014 1206   NITRITE (A) 04/11/2023 1201    TEST NOT REPORTED DUE TO COLOR INTERFERENCE OF URINE PIGMENT   LEUKOCYTESUR (A) 04/11/2023 1201    TEST NOT REPORTED DUE TO COLOR INTERFERENCE OF URINE PIGMENT   Sepsis Labs: Invalid input(s): "PROCALCITONIN", "LACTICIDVEN"  Microbiology: Recent Results (from the past 240 hour(s))  Blood Culture (routine x 2)     Status: None (Preliminary result)   Collection Time: 04/11/23 11:52 AM   Specimen: BLOOD  Result Value Ref Range Status   Specimen Description BLOOD BLOOD LEFT HAND  Final   Special Requests   Final    BOTTLES DRAWN AEROBIC AND ANAEROBIC Blood Culture results may not be optimal due to an inadequate volume of blood received in culture bottles   Culture   Final    NO GROWTH 2 DAYS Performed at [facility], [address], [place], [place] [address]    Report Status PENDING  Incomplete  Resp panel by RT-PCR (RSV, Flu A&B, Covid) Anterior Nasal Swab     Status: None   Collection Time: 04/11/23 12:07 PM   Specimen: Anterior Nasal Swab  Result Value Ref Range Status   SARS Coronavirus 2 by RT PCR NEGATIVE NEGATIVE Final    Comment: (NOTE) SARS-CoV-2 target nucleic acids are NOT DETECTED.  The SARS-CoV-2 RNA is generally detectable in upper respiratory specimens during the acute phase of infection. The lowest concentration of SARS-CoV-2 viral copies this assay can detect is 138 copies/mL. A negative result does not preclude SARS-Cov-2 infection and should not be used as the sole basis for treatment or other patient management decisions. A negative  result may occur with  improper specimen collection/handling, submission of specimen other than nasopharyngeal swab, presence of viral mutation(s) within the areas targeted by this assay, and inadequate number of viral copies(<138 copies/mL). A negative result must be combined with clinical observations, patient history, and epidemiological information. The expected result is Negative.  Fact Sheet for Patients:  BloggerCourse.com  Fact Sheet for Healthcare Providers:  SeriousBroker.it  This test is no t yet approved or cleared by the [place] FDA and  has been authorized for detection and/or diagnosis of SARS-CoV-2 by FDA under an Emergency Use Authorization (EUA). This EUA will remain  in effect (meaning this test can be used) for the duration of the COVID-19 declaration under Section 564(b)(1) of the Act, 21 U.S.C.section 360bbb-3(b)(1), unless the authorization is terminated  or revoked sooner.       Influenza A by PCR NEGATIVE NEGATIVE Final   Influenza B by PCR NEGATIVE NEGATIVE Final    Comment: (NOTE) The Xpert Xpress SARS-CoV-2/FLU/RSV plus assay is intended as an aid in the diagnosis of influenza from Nasopharyngeal swab specimens and should not be used as a sole basis for treatment. Nasal washings and aspirates are unacceptable for Xpert Xpress SARS-CoV-2/FLU/RSV testing.  Fact Sheet for Patients: BloggerCourse.com  Fact Sheet for Healthcare Providers: SeriousBroker.it  This test is not yet approved or cleared by the [place] FDA and has been authorized for detection and/or diagnosis of SARS-CoV-2 by FDA under an Emergency Use Authorization (EUA). This EUA will remain in effect (meaning this test can be used) for the duration of the COVID-19 declaration under Section 564(b)(1) of the Act, 21 U.S.C. section 360bbb-3(b)(1), unless the authorization is  terminated or revoked.     Resp Syncytial Virus by PCR NEGATIVE NEGATIVE Final    Comment: (NOTE) Fact Sheet for Patients: BloggerCourse.com  Fact Sheet for Healthcare Providers: SeriousBroker.it  This test is not yet approved or cleared by the [place] FDA and has been authorized for detection and/or diagnosis of SARS-CoV-2 by FDA under an Emergency Use Authorization (EUA). This EUA will remain in effect (meaning this test can be used) for the duration of the COVID-19 declaration under Section 564(b)(1) of the Act, 21 U.S.C. section 360bbb-3(b)(1), unless the authorization is terminated or revoked.  Performed at [facility], [address], [place], [place] [address]   MRSA Next Gen by PCR, Nasal     Status: None   Collection Time: 04/12/23  1:46 AM   Specimen: Nasal Mucosa; Nasal Swab  Result Value Ref Range Status   MRSA by PCR Next Gen NOT DETECTED NOT DETECTED Final    Comment: (NOTE) The GeneXpert MRSA Assay (FDA approved for NASAL specimens only), is one component of a comprehensive MRSA colonization surveillance program. It is not intended to diagnose MRSA infection nor to guide or monitor treatment for MRSA infections. Test performance is not FDA approved in patients less than 44 years old. Performed at [facility], [address], [place], [place] [address]     Radiology Studies: No results found.    Ruthann Angulo T. Jamiere Gulas [facility]  If 7PM-7AM, please contact night-coverage [email or web address] 04/13/2023, 12:23 PM

## 2023-04-13 NOTE — Plan of Care (Signed)
Problem: Clinical Measurements: Goal: Will remain free from infection Outcome: Progressing   Problem: Activity: Goal: Risk for activity intolerance will decrease Outcome: Progressing   Problem: Nutrition: Goal: Adequate nutrition will be maintained Outcome: Progressing   Problem: Coping: Goal: Level of anxiety will decrease Outcome: Progressing   Problem: Elimination: Goal: Will not experience complications related to bowel motility Outcome: Progressing Goal: Will not experience complications related to urinary retention Outcome: Progressing   Problem: Pain Managment: Goal: General experience of comfort will improve Outcome: Progressing   Problem: Safety: Goal: Ability to remain free from injury will improve Outcome: Progressing   Problem: Skin Integrity: Goal: Risk for impaired skin integrity will decrease Outcome: Progressing   Problem: Education: Goal: Knowledge of General Education information will improve Description: Including pain rating scale, medication(s)/side effects and non-pharmacologic comfort measures Outcome: Not Progressing   Problem: Health Behavior/Discharge Planning: Goal: Ability to manage health-related needs will improve Outcome: Not Progressing

## 2023-04-13 NOTE — Plan of Care (Signed)
Problem: Clinical Measurements: Goal: Will remain free from infection Outcome: Progressing Goal: Respiratory complications will improve Outcome: Progressing Goal: Cardiovascular complication will be avoided Outcome: Progressing

## 2023-04-13 NOTE — TOC Initial Note (Signed)
Transition of Care [facility]) - Initial/Assessment Note    Patient Details  Name: Leah Payne MRN: [id number] Date of Birth: May 07, 1946  [facility] CM/SW Contact:    Lavenia Atlas, RN Phone Number: 04/13/2023, 5:51 PM  Clinical Narrative:   Patient currently in [facility] ICU due to hemorrhagic shock. Per chart review patient is a resident at [facility]. Patient has diagnosis of dementia, this RNCM spoke with patient's husband [name] who reports patient will return to SNF at discharge. [name] reports prior to admission patient has DME: walker, wear glasses, hearing loss. [name] has no questions or concerns at this time.  This RNCM contacted [facility] to confirm SNF status, awaiting response.   TOC will continue to follow                   Expected Discharge Plan: Skilled Nursing Facility Barriers to Discharge: Continued Medical Work up   Patient Goals and CMS Choice Patient states their goals for this hospitalization and ongoing recovery are:: return to SNF for rehab (Spoke with patient's husband [name]) CMS Medicare.gov Compare Post Acute Care list provided to:: Patient Represenative (must comment) Nolberto Hanlon) Choice offered to / list presented to : Spouse [place] ownership interest in [facility].provided to:: Spouse    Expected Discharge Plan and Services In-house Referral: NA Discharge Planning Services: CM Consult Post Acute Care Choice: NA Living arrangements for the past 2 months: Skilled Nursing Facility                 DME Arranged: N/A DME Agency: NA     Representative spoke with at DME Agency: N/a HH Arranged: NA HH Agency: NA        Prior Living Arrangements/Services Living arrangements for the past 2 months: Skilled Nursing Facility Lives with:: Facility Resident Patient language and need for interpreter reviewed:: Yes Do you feel safe going back to the place where you live?: Yes      Need for Family Participation in  Patient Care: Yes (Comment) Care giver support system in place?: Yes (comment) Current home services: DME (rolling walker) Criminal Activity/Legal Involvement Pertinent to Current Situation/Hospitalization: No - Comment as needed  Activities of Daily Living      Permission Sought/Granted Permission sought to share information with : Case Manager Permission granted to share information with : Yes, Verbal Permission Granted  Share Information with NAME: Case manager           Emotional Assessment Appearance:: Appears stated age Attitude/Demeanor/Rapport: Gracious Nerida Jenner (husband)) Affect (typically observed): Accepting Nolberto Hanlon (husband)) Orientation: :  (PMH: dementia) Alcohol / Substance Use: Not Applicable Psych Involvement: No (comment)  Admission diagnosis:  Hemorrhagic shock (HCC) [R57.8] Patient Active Problem List   Diagnosis Date Noted   Hemorrhagic shock (HCC) 04/11/2023   Lactic acidosis 04/11/2023   Acute blood loss anemia 04/11/2023   Hyperglycemia 04/11/2023   Bladder mass 04/05/2023   Gross hematuria 04/04/2023   History of ischemic stroke 02/16/2023   Hypokalemia 02/15/2023   Gait instability 02/15/2023   Major neurocognitive disorder (HCC) 02/15/2023   Generalized weakness 02/14/2023   Asymmetrical hearing loss 02/25/2020   Vitamin D deficiency 03/21/2017   Dizziness 03/23/2016   Alzheimer disease (HCC) 12/17/2015   WPW (Wolff-Parkinson-White syndrome)    Type 2 diabetes, controlled, 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HCC) 09/17/2015   Hypothyroidism 09/17/2015   Debility    UTI (urinary tract infection) 08/29/2014   Overactive bladder 08/29/2014   Seizure disorder (  HCC) 08/04/2013   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HCC) 08/04/2013   Essential hypertension 08/04/2013   Hyperlipidemia associated with type 2 diabetes mellitus (HCC) 08/04/2013   PCP:  Dennard Nip, NP Pharmacy:   [facility] - [place], [place] - [address]  [address] [place] [address] Phone: [phone] Fax: [phone]  [id number] - [place], [place] - [address] AT [facility] [address] [place] [address] Phone: [phone] Fax: [phone]  [facility], [place] - [address]  [place] [address] Phone: [phone] Fax: [phone]     Social Determinants of Health (SDOH) Social History: SDOH Screenings   Food Insecurity: No Food Insecurity (02/15/2023)  Housing: Patient Declined (02/15/2023)  Transportation Needs: Unknown (02/15/2023)  Utilities: Not At Risk (02/15/2023)  Alcohol Screen: Low Risk  (07/06/2021)  Depression (PHQ2-9): Low Risk  (07/06/2021)  Financial Resource Strain: Low Risk  (07/06/2021)  Physical Activity: Inactive (07/06/2021)  Social Connections: Socially Integrated (07/06/2021)  Stress: No Stress Concern Present (07/06/2021)  Tobacco Use: Low Risk  (04/11/2023)   SDOH Interventions:     Readmission Risk Interventions    04/13/2023    5:45 PM  Readmission Risk Prevention Plan  Transportation Screening Complete  PCP or Specialist Appt within 5-7 Days Complete  Home Care Screening Complete  Medication Review (RN CM) Complete

## 2023-04-14 DIAGNOSIS — Q039 Congenital hydrocephalus, unspecified: Secondary | ICD-10-CM | POA: Diagnosis not present

## 2023-04-14 DIAGNOSIS — R578 Other shock: Secondary | ICD-10-CM | POA: Diagnosis not present

## 2023-04-14 DIAGNOSIS — R5381 Other malaise: Secondary | ICD-10-CM | POA: Diagnosis not present

## 2023-04-14 DIAGNOSIS — I10 Essential (primary) hypertension: Secondary | ICD-10-CM | POA: Diagnosis not present

## 2023-04-14 LAB — MAGNESIUM: Magnesium: 1.6 mg/dL — ABNORMAL LOW (ref 1.7–2.4)

## 2023-04-14 LAB — CBC
HCT: 38.3 % (ref 36.0–46.0)
Hemoglobin: 12.3 g/dL (ref 12.0–15.0)
MCH: 29.1 pg (ref 26.0–34.0)
MCHC: 32.1 g/dL (ref 30.0–36.0)
MCV: 90.5 fL (ref 80.0–100.0)
Platelets: 163 10*3/uL (ref 150–400)
RBC: 4.23 MIL/uL (ref 3.87–5.11)
RDW: 16.4 % — ABNORMAL HIGH (ref 11.5–15.5)
WBC: 6 10*3/uL (ref 4.0–10.5)
nRBC: 0 % (ref 0.0–0.2)

## 2023-04-14 LAB — RENAL FUNCTION PANEL
Albumin: 1.8 g/dL — ABNORMAL LOW (ref 3.5–5.0)
Anion gap: 7 (ref 5–15)
BUN: 6 mg/dL — ABNORMAL LOW (ref 8–23)
CO2: 22 mmol/L (ref 22–32)
Calcium: 7.4 mg/dL — ABNORMAL LOW (ref 8.9–10.3)
Chloride: 106 mmol/L (ref 98–111)
Creatinine, Ser: 0.38 mg/dL — ABNORMAL LOW (ref 0.44–1.00)
GFR, Estimated: >60 mL/min (ref >60)
Glucose, Bld: 72 mg/dL (ref 70–99)
Phosphorus: 2.5 mg/dL (ref 2.5–4.6)
Potassium: 2.7 mmol/L — CL (ref 3.5–5.1)
Sodium: 135 mmol/L (ref 135–145)

## 2023-04-14 LAB — GLUCOSE, CAPILLARY
Glucose-Capillary: 88 mg/dL (ref 70–99)
Glucose-Capillary: 88 mg/dL (ref 70–99)
Glucose-Capillary: 87 mg/dL (ref 70–99)

## 2023-04-14 MED ORDER — POTASSIUM CHLORIDE CRYS ER 20 MEQ PO TBCR
40.0000 meq | EXTENDED_RELEASE_TABLET | Freq: Once | ORAL | Status: AC
  Administered 2023-04-14: 20 meq via ORAL
  Filled 2023-04-14: qty 2

## 2023-04-14 MED ORDER — POTASSIUM CHLORIDE 20 MEQ PO PACK
60.0000 meq | PACK | Freq: Once | ORAL | Status: AC
  Administered 2023-04-14: 60 meq via ORAL
  Filled 2023-04-14: qty 3

## 2023-04-14 MED ORDER — MAGNESIUM SULFATE 2 GM/50ML IV SOLN
2.0000 g | Freq: Once | INTRAVENOUS | Status: AC
  Administered 2023-04-14: 2 g via INTRAVENOUS
  Filled 2023-04-14: qty 50

## 2023-04-14 MED ORDER — POTASSIUM CHLORIDE 10 MEQ/100ML IV SOLN
10.0000 meq | INTRAVENOUS | Status: AC
  Administered 2023-04-14 (×2): 10 meq via INTRAVENOUS
  Filled 2023-04-14 (×2): qty 100

## 2023-04-14 NOTE — Evaluation (Signed)
Occupational Therapy Evaluation Patient Details Name: Leah Payne MRN: [id number] DOB: 07/10/1946 Today's Date: 04/14/2023   History of Present Illness Pt admitted from [facility] 2* hemorrhagic shock 2* gross hematuria.  Pt with hx of CM with peripheral neuropathy, WPW, hydrocephalus, epilepsy and prior CVA   Clinical Impression   Patient evaluated by Occupational Therapy and found to be at her baseline which is overall total care for ADLs and mobility. Pt with limited cognition and only asking for ice cream and stating, "That's enough" with all mobility. No family present.  See below for any follow-up Occupational Therapy or equipment needs. OT is signing off. Thank you for this referral.        If plan is discharge home, recommend the following: Two people to help with walking and/or transfers;A lot of help with bathing/dressing/bathroom;A lot of help with walking and/or transfers    Functional Status Assessment  Patient has not had a recent decline in their functional status  Equipment Recommendations  None recommended by OT    Recommendations for Other Services       Precautions / Restrictions Precautions Precautions: Fall Restrictions Weight Bearing Restrictions: No      Mobility Bed Mobility Overal bed mobility: Needs Assistance Bed Mobility: Supine to Sit, Sit to Supine     Supine to sit: Total assist Sit to supine: Total assist   General bed mobility comments: Pt not following cues to self assist    Transfers                          Balance Overall balance assessment: Needs assistance Sitting-balance support: Single extremity supported (Pt unable to bring feet underneath her for support despite use of floor mat to allow pt to have feet flat on "floor") Sitting balance-Leahy Scale: Poor                                     ADL either performed or assessed with clinical judgement   ADL Overall ADL's : Needs  assistance/impaired Eating/Feeding: Maximal assistance;Total assistance Eating/Feeding Details (indicate cue type and reason): Per RN, pt is a feeder Grooming: Maximal assistance;Bed level;Wash/dry hands;Wash/dry face   Upper Body Bathing: Maximal assistance;Total assistance;Bed level   Lower Body Bathing: Total assistance;+2 for physical assistance;Bed level   Upper Body Dressing : Maximal assistance;Bed level   Lower Body Dressing: Total assistance;+2 for physical assistance;Bed level     Toilet Transfer Details (indicate cue type and reason): Unable. Pt unable to bring feet underneath her and resists assistance to position for readniness to stand.  Pt was evaluated in July by phsyical Therpy at [facility] who assessed a stand with Total Assist and pt having Zero/absent standing balance.  This seems to be pt's baseline and is likely assisted out of bed with a m3echnical lift. Toileting- Clothing Manipulation and Hygiene: Total assistance;+2 for physical assistance;Bed level       Functional mobility during ADLs: Total assistance       Vision   Additional Comments: Able to read 3 fingers from ~10 feet away. Stated that she wears glasses but non found in room.     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 Limited RUE ROM, significantly weak of grossly 2+/5 RUE Coordination: decreased fine motor LUE Deficits / Details: Shoulder limited to ~90 degrees of AROM. Quick to fatigue with reaching tasks at EOB. Able to grip roll of tape for reaching test.   Lower Extremity Assessment Lower Extremity Assessment: Generalized weakness   Cervical / Trunk Assessment Cervical / Trunk Assessment:  Kyphotic   Communication Communication Communication: Difficulty following commands/understanding Following commands: Follows one step commands consistently Cueing Techniques: Verbal cues;Gestural cues;Tactile cues;Visual cues   Cognition Arousal: Alert Behavior During Therapy: Flat affect Overall Cognitive Status: History of cognitive impairments - at baseline                                 General Comments: Follows simple cues inconsitently, frequently stating "thats enough" before task is completed. Contiunuously asking for ice cream. (RN provided)     General Comments       Exercises     Shoulder Instructions      Home Living Family/patient expects to be discharged to::  (Long Term residential Nursing Home)                                 Additional Comments: pt is unable to provide a history due to communication and cognitive deficits      Prior Functioning/Environment Prior Level of Function : Patient poor historian/Family not available             Mobility Comments: Pt states "I don't know" with questioning on PLOF ADLs Comments: per chart assisted by Nursing Home staff        OT Problem List:        OT Treatment/Interventions:      OT Goals(Current goals can be found in the care plan section) Acute Rehab OT Goals OT Goal Formulation: All assessment and education complete, DC therapy Potential to Achieve Goals: Poor  OT Frequency:      Co-evaluation PT/OT/SLP Co-Evaluation/Treatment: Yes Reason for Co-Treatment: For patient/therapist safety;To address functional/ADL transfers PT goals addressed during session: Mobility/safety with mobility OT goals addressed during session: ADL's and self-care      AM-PAC OT "6 Clicks" Daily Activity     Outcome Measure Help from another person eating meals?: A Lot Help from another person taking care of personal grooming?: A Lot Help from another person toileting, which includes  using toliet, bedpan, or urinal?: Total Help from another person bathing (including washing, rinsing, drying)?: Total Help from another person to put on and taking off regular upper body clothing?: A Lot Help from another person to put on and taking off regular lower body clothing?: Total 6 Click Score: 9   End of Session Nurse Communication: Mobility status;Other (comment) (Request for ice cream)  Activity Tolerance: Patient limited by fatigue Patient left: in bed;with call bell/phone within reach;with bed alarm set;with nursing/sitter in room                   Time: 4782-9562 OT Time Calculation (min): 19 min Charges:  OT General Charges $OT Visit: 1 Visit OT Evaluation $OT Eval Low Complexity: 1 Low  Cire Deyarmin, OT Acute Rehab Services Office: [phone] 04/14/2023  Theodoro Clock 04/14/2023, 1:18 PM

## 2023-04-14 NOTE — TOC Progression Note (Signed)
Transition of Care [facility]) - Progression Note    Patient Details  Name: Leah Payne MRN: [id number] Date of Birth: [birth year]  [facility]) CM/SW Contact  Lavenia Atlas, RN Phone Number: 04/14/2023, 6:29 PM  Clinical Narrative:   Received response from [facility] that patient is LTC and can return.  TOC will continue to follow    Expected Discharge Plan: Skilled Nursing Facility Barriers to Discharge: Continued Medical Work up  Expected Discharge Plan and Services In-house Referral: NA Discharge Planning Services: CM Consult Post Acute Care Choice: NA Living arrangements for the past 2 months: Skilled Nursing Facility                 DME Arranged: N/A DME Agency: NA     Representative spoke with at DME Agency: N/a HH Arranged: NA HH Agency: NA         Social Determinants of Health (SDOH) Interventions SDOH Screenings   Food Insecurity: No Food Insecurity (02/15/2023)  Housing: Patient Declined (02/15/2023)  Transportation Needs: Unknown (02/15/2023)  Utilities: Not At Risk (02/15/2023)  Alcohol Screen: Low Risk  (07/06/2021)  Depression (PHQ2-9): Low Risk  (07/06/2021)  Financial Resource Strain: Low Risk  (07/06/2021)  Physical Activity: Inactive (07/06/2021)  Social Connections: Socially Integrated (07/06/2021)  Stress: No Stress Concern Present (07/06/2021)  Tobacco Use: Low Risk  (04/11/2023)    Readmission Risk Interventions    04/13/2023    5:45 PM  Readmission Risk Prevention Plan  Transportation Screening Complete  PCP or Specialist Appt within 5-7 Days Complete  Home Care Screening Complete  Medication Review (RN CM) Complete

## 2023-04-14 NOTE — Progress Notes (Signed)
PROGRESS NOTE  Leah Payne ION:[id number] DOB: [birth year]   PCP: Dennard Nip, NP  Patient is from: Nursing home  DOA: 04/11/2023 LOS: 3  Chief complaints Chief Complaint  Patient presents with   Vaginal Bleeding     Brief Narrative / Interim history: 77 year old F with PMH of congenital hydrocephalus, seizure disorder, hematuria, overactive bladder, dementia, HTN, HLD, WPW syndrome and recent hospitalization at [facility] for hematuria for which he underwent CT urogram which was suspicious for neoplastic disease and was treated with CBI and discharged on 9/13 with Foley catheter and continued on home aspirin and Plavix, presented to [facility] with altered mental status and bloody diaper.  Per EMS, she was hypotensive and started on pressor.  Hemoglobin of 6.8.  Lactic acid 8.0.  K3.0.  Glucose 250.  She was resuscitated with IV fluid and 3 units of blood and admitted to ICU.  Reportedly, [facility] declined admission and she was admitted here after consultation with urology.  Hemoglobin improved to 13.2 and remained stable since then.  Still with gross hematuria but no blood clots.  She is also on IV ceftriaxone.  Discussed with on-call urology, Dr. Mena Goes on 9/19 who recommended continuing Foley catheter and outpatient follow-up.   Subjective: Seen and examined earlier this morning.  No major events overnight of this morning.  Continues to have gross hematuria.  Hemoglobin dropped to 12.3 this morning.  She has no complaints but not a great historian.  Objective: Vitals:   04/14/23 0400 04/14/23 0500 04/14/23 0700 04/14/23 0800  BP: 104/66   134/87  Pulse: 84  82 85  Resp: 13  15 17   Temp: 98.2 F (36.8 C)  98.4 F (36.9 C) 99 F (37.2 C)  TempSrc: Bladder   Bladder  SpO2: 97%  100% 100%  Weight:  49 kg    Height:        Examination:  GENERAL: Appears frail.  Nontoxic. HEENT: MMM.  Vision and hearing grossly intact.  Hydrocephalus. NECK:  Supple.  No apparent JVD.  RESP:  No IWOB.  Fair aeration bilaterally. CVS:  RRR. Heart sounds normal.  ABD/GI/GU: BS+. Abd soft, NTND.  Indwelling Foley.  Gross hematuria. MSK/EXT:  Moves extremities.  Significant muscle mass and subcu fat loss. SKIN: no apparent skin lesion or wound NEURO: Awake and oriented to self and place but not time or situation.  No apparent focal neuro deficit. PSYCH: Calm. Normal affect.   Procedures:  None  Microbiology summarized: COVID-19, influenza and RSV PCR nonreactive MRSA PCR screen nonreactive Blood cultures NGTD  Assessment and plan: Hemorrhagic shock  secondary to gross hematuria in the setting of aspirin and Plavix.  Still with gross hematuria but no blood clots.  Hemoglobin improving.  Renal function stable.  Hemodynamically stable. Recent Labs    02/14/23 1531 02/15/23 0423 02/15/23 2041 02/18/23 0304 04/11/23 1122 04/11/23 1128 04/11/23 2304 04/12/23 0336 04/13/23 0430 04/14/23 0255  HGB 12.6 11.4* 12.8 11.2* 6.8* 6.8* 13.2 13.5 13.6 12.3  -Hold aspirin and Plavix to allow washout -Urology, Dr. Mena Goes on 9/19 who recommended discharge with Foley and outpatient follow-up -Continue IV ceftriaxone for 5 days -Check CBC daily  History of congenital hydrocephalus/epilepsy: Stable Alzheimer's dementia without behavioral disturbance: Oriented to self and place. -Continue home Keppra and Aricept.  History of CVA-she was on Plavix and aspirin.  It is unusual to continue DAPT indefinitely for CVA.  Given recurrent gross hematuria, will discontinue Plavix and aspirin indefinitely  Acute blood loss anemia: -As above  Hypothyroidism -Continue home Synthroid   Lactic acidosis/metabolic acidosis high anion gap: Resolved.   DM-2 ruled out.  Physical deconditioning: Unsure about mobility status at baseline. -PT/OT eval  Hyperlipidemia: -Continue statins.   Mood disorder: -Continue home  meds.  Hypokalemia/hypomagnesemia -Monitor replenish as appropriate  Body mass index is 19.76 kg/m.    DVT prophylaxis:  SCDs Start: 04/11/23 1803 SCDs Start: 04/11/23 1800  Code Status: DNR/DNI Family Communication: None at bedside. Level of care: Med-Surg Status is: Inpatient Remains inpatient appropriate because: Gross hematuria   Final disposition: SNF Consultants:  Urology   55 minutes with more than 50% spent in reviewing records, counseling patient/family and coordinating care.   Sch Meds:  Scheduled Meds:  atorvastatin  40 mg Oral QPM   Chlorhexidine Gluconate Cloth  6 each Topical Daily   donepezil  10 mg Oral QHS   levothyroxine  25 mcg Oral QAC breakfast   melatonin  3 mg Oral QHS   mirabegron ER  50 mg Oral Daily   mouth rinse  15 mL Mouth Rinse 4 times per day   potassium chloride  40 mEq Oral Once   sertraline  100 mg Oral BH-q7a   sodium chloride flush  3 mL Intravenous Q12H   Continuous Infusions:  sodium chloride 10 mL/hr at 04/14/23 0800   cefTRIAXone (ROCEPHIN)  IV Stopped (04/13/23 1147)   levETIRAcetam Stopped (04/14/23 0009)   PRN Meds:.sodium chloride, acetaminophen **OR** acetaminophen, albuterol, bisacodyl, dextromethorphan, fentaNYL (SUBLIMAZE) injection, guaiFENesin, ondansetron **OR** ondansetron (ZOFRAN) IV, mouth rinse, sodium chloride flush  Antimicrobials: Anti-infectives (From admission, onward)    Start     Dose/Rate Route Frequency Ordered Stop   04/11/23 1115  cefTRIAXone (ROCEPHIN) 2 g in sodium chloride 0.9 % 100 mL IVPB        2 g 200 mL/hr over 30 Minutes Intravenous Every 24 hours 04/11/23 1111 04/18/23 1114        I have personally reviewed the following labs and images: CBC: Recent Labs  Lab 04/11/23 1128 04/11/23 2304 04/12/23 0336 04/13/23 0430 04/14/23 0255  WBC 7.0  --  9.7 8.7 6.0  NEUTROABS 3.6  --   --   --   --   HGB 6.8* 13.2 13.5 13.6 12.3  HCT 21.0* 37.7 39.8 41.0 38.3  MCV 93.3  --  85.0  88.0 90.5  PLT 351  --  204 224 163   BMP &GFR Recent Labs  Lab 04/11/23 1122 04/11/23 1128 04/12/23 0336 04/13/23 0257 04/14/23 0255  NA 138 137 137 141 135  K 3.2* 3.0* 3.2* 4.0 2.7*  CL 100 99 104 109 106  CO2  --  21* 23 25 22   GLUCOSE 244* 245* 88 101* 72  BUN 10 12 13 10  6*  CREATININE 0.80 0.94 0.63 0.48 0.38*  CALCIUM  --  7.8* 7.9* 8.0* 7.4*  MG  --   --  2.1  --  1.6*  PHOS  --   --   --   --  2.5   Estimated Creatinine Clearance: 44.8 mL/min (A) (by C-G formula based on SCr of 0.38 mg/dL (L)). Liver & Pancreas: Recent Labs  Lab 04/11/23 1128 04/14/23 0255  AST 38  --   ALT 13  --   ALKPHOS 51  --   BILITOT 0.4  --   PROT 4.6*  --   ALBUMIN 2.1* 1.8*   No results for input(s): "LIPASE", "AMYLASE" in the last 168 hours.  No results for input(s): "AMMONIA" in the last 168 hours. Diabetic: No results for input(s): "HGBA1C" in the last 72 hours. Recent Labs  Lab 04/13/23 0828 04/13/23 1207 04/13/23 1638 04/13/23 2159 04/14/23 0819  GLUCAP 81 72 70 82 88   Cardiac Enzymes: No results for input(s): "CKTOTAL", "CKMB", "CKMBINDEX", "TROPONINI" in the last 168 hours. No results for input(s): "PROBNP" in the last 8760 hours. Coagulation Profile: Recent Labs  Lab 04/11/23 1128  INR 1.2   Thyroid Function Tests: No results for input(s): "TSH", "T4TOTAL", "FREET4", "T3FREE", "THYROIDAB" in the last 72 hours. Lipid Profile: No results for input(s): "CHOL", "HDL", "LDLCALC", "TRIG", "CHOLHDL", "LDLDIRECT" in the last 72 hours. Anemia Panel: No results for input(s): "VITAMINB12", "FOLATE", "FERRITIN", "TIBC", "IRON", "RETICCTPCT" in the last 72 hours. Urine analysis:    Component Value Date/Time   COLORURINE RED (A) 04/11/2023 1201   APPEARANCEUR TURBID (A) 04/11/2023 1201   LABSPEC  04/11/2023 1201    TEST NOT REPORTED DUE TO COLOR INTERFERENCE OF URINE PIGMENT   PHURINE  04/11/2023 1201    TEST NOT REPORTED DUE TO COLOR INTERFERENCE OF URINE PIGMENT    GLUCOSEU (A) 04/11/2023 1201    TEST NOT REPORTED DUE TO COLOR INTERFERENCE OF URINE PIGMENT   HGBUR NEGATIVE 04/11/2023 1201   BILIRUBINUR (A) 04/11/2023 1201    TEST NOT REPORTED DUE TO COLOR INTERFERENCE OF URINE PIGMENT   BILIRUBINUR neg 03/07/2018 1020   KETONESUR (A) 04/11/2023 1201    TEST NOT REPORTED DUE TO COLOR INTERFERENCE OF URINE PIGMENT   PROTEINUR NEGATIVE 04/11/2023 1201   UROBILINOGEN 0.2 03/07/2018 1020   UROBILINOGEN 1.0 08/29/2014 1206   NITRITE (A) 04/11/2023 1201    TEST NOT REPORTED DUE TO COLOR INTERFERENCE OF URINE PIGMENT   LEUKOCYTESUR (A) 04/11/2023 1201    TEST NOT REPORTED DUE TO COLOR INTERFERENCE OF URINE PIGMENT   Sepsis Labs: Invalid input(s): "PROCALCITONIN", "LACTICIDVEN"  Microbiology: Recent Results (from the past 240 hour(s))  Blood Culture (routine x 2)     Status: None (Preliminary result)   Collection Time: 04/11/23 11:52 AM   Specimen: BLOOD  Result Value Ref Range Status   Specimen Description BLOOD BLOOD LEFT HAND  Final   Special Requests   Final    BOTTLES DRAWN AEROBIC AND ANAEROBIC Blood Culture results may not be optimal due to an inadequate volume of blood received in culture bottles   Culture   Final    NO GROWTH 3 DAYS Performed at [facility], [address] [place], [place] [address]    Report Status PENDING  Incomplete  Resp panel by RT-PCR (RSV, Flu A&B, Covid) Anterior Nasal Swab     Status: None   Collection Time: 04/11/23 12:07 PM   Specimen: Anterior Nasal Swab  Result Value Ref Range Status   SARS Coronavirus 2 by RT PCR NEGATIVE NEGATIVE Final    Comment: (NOTE) SARS-CoV-2 target nucleic acids are NOT DETECTED.  The SARS-CoV-2 RNA is generally detectable in upper respiratory specimens during the acute phase of infection. The lowest concentration of SARS-CoV-2 viral copies this assay can detect is 138 copies/mL. A negative result does not preclude SARS-Cov-2 infection and should not be used as the sole  basis for treatment or other patient management decisions. A negative result may occur with  improper specimen collection/handling, submission of specimen other than nasopharyngeal swab, presence of viral mutation(s) within the areas targeted by this assay, and inadequate number of viral copies(<138 copies/mL). A negative result must be combined with  clinical observations, patient history, and epidemiological information. The expected result is Negative.  Fact Sheet for Patients:  BloggerCourse.com  Fact Sheet for Healthcare Providers:  SeriousBroker.it  This test is no t yet approved or cleared by the [place] FDA and  has been authorized for detection and/or diagnosis of SARS-CoV-2 by FDA under an Emergency Use Authorization (EUA). This EUA will remain  in effect (meaning this test can be used) for the duration of the COVID-19 declaration under Section 564(b)(1) of the Act, 21 U.S.C.section 360bbb-3(b)(1), unless the authorization is terminated  or revoked sooner.       Influenza A by PCR NEGATIVE NEGATIVE Final   Influenza B by PCR NEGATIVE NEGATIVE Final    Comment: (NOTE) The Xpert Xpress SARS-CoV-2/FLU/RSV plus assay is intended as an aid in the diagnosis of influenza from Nasopharyngeal swab specimens and should not be used as a sole basis for treatment. Nasal washings and aspirates are unacceptable for Xpert Xpress SARS-CoV-2/FLU/RSV testing.  Fact Sheet for Patients: BloggerCourse.com  Fact Sheet for Healthcare Providers: SeriousBroker.it  This test is not yet approved or cleared by the [place] FDA and has been authorized for detection and/or diagnosis of SARS-CoV-2 by FDA under an Emergency Use Authorization (EUA). This EUA will remain in effect (meaning this test can be used) for the duration of the COVID-19 declaration under Section 564(b)(1) of the Act,  21 U.S.C. section 360bbb-3(b)(1), unless the authorization is terminated or revoked.     Resp Syncytial Virus by PCR NEGATIVE NEGATIVE Final    Comment: (NOTE) Fact Sheet for Patients: BloggerCourse.com  Fact Sheet for Healthcare Providers: SeriousBroker.it  This test is not yet approved or cleared by the [place] FDA and has been authorized for detection and/or diagnosis of SARS-CoV-2 by FDA under an Emergency Use Authorization (EUA). This EUA will remain in effect (meaning this test can be used) for the duration of the COVID-19 declaration under Section 564(b)(1) of the Act, 21 U.S.C. section 360bbb-3(b)(1), unless the authorization is terminated or revoked.  Performed at [facility], [address] [place], [place] [address]   MRSA Next Gen by PCR, Nasal     Status: None   Collection Time: 04/12/23  1:46 AM   Specimen: Nasal Mucosa; Nasal Swab  Result Value Ref Range Status   MRSA by PCR Next Gen NOT DETECTED NOT DETECTED Final    Comment: (NOTE) The GeneXpert MRSA Assay (FDA approved for NASAL specimens only), is one component of a comprehensive MRSA colonization surveillance program. It is not intended to diagnose MRSA infection nor to guide or monitor treatment for MRSA infections. Test performance is not FDA approved in patients less than 87 years old. Performed at [facility], [address] [place], [place] [address]     Radiology Studies: No results found.    Channa Hazelett T. Maycee Blasco [facility]  If 7PM-7AM, please contact night-coverage [email or web address] 04/14/2023, 10:19 AM

## 2023-04-14 NOTE — Evaluation (Signed)
Physical Therapy Evaluation Patient Details Name: Leah Payne MRN: [id number] DOB: 04-18-1946 Today's Date: 04/14/2023  History of Present Illness  Pt admitted from [facility] SNF 2* hemorrhagic shock 2* gross hematuria.  Pt with hx of CM with peripheral neuropathy, WPW, hydrocephalus, epilepsy and prior CVA  Clinical Impression  Patient evaluated by PT and found to be at her baseline which is overall total care for ADLs and mobility. Pt with limited cognition and only asking for ice cream and stating, "That's enough" with all mobility. No family present.  Per chart, plan is return to prior living arrangement at [facility].  PT is signing off. Thank you for this referral.       If plan is discharge home, recommend the following: Two people to help with bathing/dressing/bathroom;Two people to help with walking and/or transfers;Assistance with cooking/housework;Assist for transportation;Help with stairs or ramp for entrance   Can travel by private vehicle   No    Equipment Recommendations None recommended by PT  Recommendations for Other Services       Functional Status Assessment Patient has had a recent decline in their functional status and/or demonstrates limited ability to make significant improvements in function in a reasonable and predictable amount of time     Precautions / Restrictions Precautions Precautions: Fall Restrictions Weight Bearing Restrictions: No      Mobility  Bed Mobility Overal bed mobility: Needs Assistance Bed Mobility: Supine to Sit, Sit to Supine     Supine to sit: Total assist Sit to supine: Total assist   General bed mobility comments: Pt not following cues to self assist    Transfers                   General transfer comment: NT for safety 2* pt limited compliance with bed mobility and poor sitting balance    Ambulation/Gait                  Stairs            Wheelchair Mobility     Tilt Bed     Modified Rankin (Stroke Patients Only)       Balance Overall balance assessment: Needs assistance Sitting-balance support: Single extremity supported Sitting balance-Leahy Scale: Poor Sitting balance - Comments: R lean with posterior drift                                     Pertinent Vitals/Pain Pain Assessment Pain Assessment: No/denies pain    Home Living Family/patient expects to be discharged to::  (Long Term residential Nursing Home)                   Additional Comments: pt is unable to provide a history due to communication and cognitive deficits    Prior Function Prior Level of Function : Patient poor historian/Family not available             Mobility Comments: Pt states "I don't know" with questioning on PLOF ADLs Comments: per chart assisted by Nursing Home staff     Extremity/Trunk Assessment   Upper Extremity Assessment Upper Extremity Assessment: RUE deficits/detail;LUE deficits/detail;Left hand dominant RUE Deficits / Details: Pt with 2 thumbs to RT hand, both with arthritic deformities. Limited RUE ROM, significantly weak of grossly 2+/5 RUE Coordination: decreased fine motor LUE Deficits / Details: Shoulder limited to ~90 degrees of AROM. Quick to fatigue  with reaching tasks at EOB. Able to grip roll of tape for reaching test.    Lower Extremity Assessment Lower Extremity Assessment: Generalized weakness    Cervical / Trunk Assessment Cervical / Trunk Assessment: Kyphotic  Communication   Communication Communication: Difficulty following commands/understanding Following commands: Follows one step commands consistently Cueing Techniques: Verbal cues;Gestural cues;Tactile cues;Visual cues  Cognition Arousal: Alert Behavior During Therapy: Flat affect Overall Cognitive Status: History of cognitive impairments - at baseline                                 General Comments: Follows simple cues  inconsitently, frequently stating "thats enough" before task is completed        General Comments      Exercises     Assessment/Plan    PT Assessment Patient does not need any further PT services  PT Problem List Decreased strength;Decreased range of motion;Decreased activity tolerance;Decreased balance;Decreased mobility;Decreased knowledge of use of DME       PT Treatment Interventions      PT Goals (Current goals can be found in the Care Plan section)  Acute Rehab PT Goals Patient Stated Goal: Can I have some ice cream PT Goal Formulation: All assessment and education complete, DC therapy    Frequency       Co-evaluation PT/OT/SLP Co-Evaluation/Treatment: Yes Reason for Co-Treatment: For patient/therapist safety;To address functional/ADL transfers PT goals addressed during session: Mobility/safety with mobility OT goals addressed during session: ADL's and self-care       AM-PAC PT "6 Clicks" Mobility  Outcome Measure Help needed turning from your back to your side while in a flat bed without using bedrails?: A Lot Help needed moving from lying on your back to sitting on the side of a flat bed without using bedrails?: Total Help needed moving to and from a bed to a chair (including a wheelchair)?: Total Help needed standing up from a chair using your arms (e.g., wheelchair or bedside chair)?: Total Help needed to walk in hospital room?: Total Help needed climbing 3-5 steps with a railing? : Total 6 Click Score: 7    End of Session Equipment Utilized During Treatment: Gait belt Activity Tolerance: Patient limited by fatigue Patient left: in bed;with call bell/phone within reach;with bed alarm set Nurse Communication: Mobility status;Need for lift equipment PT Visit Diagnosis: Muscle weakness (generalized) (M62.81);Difficulty in walking, not elsewhere classified (R26.2)    Time: 1610-9604 PT Time Calculation (min) (ACUTE ONLY): 22 min   Charges:   PT  Evaluation $PT Eval Low Complexity: 1 Low   PT General Charges $$ ACUTE PT VISIT: 1 Visit         Mauro Kaufmann PT Acute Rehabilitation Services Pager [phone] Office [phone]   Mylah Baynes 04/14/2023, 1:52 PM

## 2023-04-15 ENCOUNTER — Other Ambulatory Visit: Payer: Self-pay

## 2023-04-15 DIAGNOSIS — E876 Hypokalemia: Secondary | ICD-10-CM | POA: Diagnosis not present

## 2023-04-15 DIAGNOSIS — I456 Pre-excitation syndrome: Secondary | ICD-10-CM | POA: Diagnosis not present

## 2023-04-15 DIAGNOSIS — G40909 Epilepsy, unspecified, not intractable, without status epilepticus: Secondary | ICD-10-CM | POA: Diagnosis not present

## 2023-04-15 DIAGNOSIS — R578 Other shock: Secondary | ICD-10-CM | POA: Diagnosis not present

## 2023-04-15 LAB — CBC
HCT: 39.8 % (ref 36.0–46.0)
Hemoglobin: 13 g/dL (ref 12.0–15.0)
MCH: 28.9 pg (ref 26.0–34.0)
MCHC: 32.7 g/dL (ref 30.0–36.0)
MCV: 88.4 fL (ref 80.0–100.0)
Platelets: 190 10*3/uL (ref 150–400)
RBC: 4.5 MIL/uL (ref 3.87–5.11)
RDW: 15.9 % — ABNORMAL HIGH (ref 11.5–15.5)
WBC: 5.1 10*3/uL (ref 4.0–10.5)
nRBC: 0 % (ref 0.0–0.2)

## 2023-04-15 LAB — RENAL FUNCTION PANEL
Albumin: 2.1 g/dL — ABNORMAL LOW (ref 3.5–5.0)
Anion gap: 7 (ref 5–15)
BUN: 7 mg/dL — ABNORMAL LOW (ref 8–23)
CO2: 24 mmol/L (ref 22–32)
Calcium: 7.6 mg/dL — ABNORMAL LOW (ref 8.9–10.3)
Chloride: 105 mmol/L (ref 98–111)
Creatinine, Ser: 0.47 mg/dL (ref 0.44–1.00)
GFR, Estimated: >60 mL/min (ref >60)
Glucose, Bld: 83 mg/dL (ref 70–99)
Phosphorus: 2.3 mg/dL — ABNORMAL LOW (ref 2.5–4.6)
Potassium: 3.7 mmol/L (ref 3.5–5.1)
Sodium: 136 mmol/L (ref 135–145)

## 2023-04-15 LAB — MAGNESIUM: Magnesium: 1.9 mg/dL (ref 1.7–2.4)

## 2023-04-15 MED ORDER — LEVETIRACETAM 500 MG PO TABS
750.0000 mg | ORAL_TABLET | Freq: Two times a day (BID) | ORAL | Status: DC
  Administered 2023-04-15: 750 mg via ORAL
  Filled 2023-04-15 (×2): qty 1

## 2023-04-15 NOTE — TOC Transition Note (Signed)
Transition of Care [facility]) - CM/SW Discharge Note   Patient Details  Name: Leah Payne MRN: [id number] Date of Birth: [birth year]  [facility] CM/SW Contact:  Georgie Chard, LCSW Phone Number: 04/15/2023, 12:13 PM   Clinical Narrative:    CSW has reached out to [facility] about patient being DC back to facility. At this time patient is able to return. This CSW has provided RN report and hall for patient. TOC will arrange transport at this time.     Barriers to Discharge: Continued Medical Work up   Patient Goals and CMS Choice CMS Medicare.gov Compare Post Acute Care list provided to:: Patient Represenative (must comment) Nolberto Hanlon) Choice offered to / list presented to : Spouse  Discharge Placement                         Discharge Plan and Services Additional resources added to the After Visit Summary for   In-house Referral: NA Discharge Planning Services: CM Consult Post Acute Care Choice: NA          DME Arranged: N/A DME Agency: NA     Representative spoke with at DME Agency: N/a HH Arranged: NA HH Agency: NA        Social Determinants of Health (SDOH) Interventions SDOH Screenings   Food Insecurity: No Food Insecurity (02/15/2023)  Housing: Patient Declined (02/15/2023)  Transportation Needs: Unknown (02/15/2023)  Utilities: Not At Risk (02/15/2023)  Alcohol Screen: Low Risk  (07/06/2021)  Depression (PHQ2-9): Low Risk  (07/06/2021)  Financial Resource Strain: Low Risk  (07/06/2021)  Physical Activity: Inactive (07/06/2021)  Social Connections: Socially Integrated (07/06/2021)  Stress: No Stress Concern Present (07/06/2021)  Tobacco Use: Low Risk  (04/11/2023)     Readmission Risk Interventions    04/13/2023    5:45 PM  Readmission Risk Prevention Plan  Transportation Screening Complete  PCP or Specialist Appt within 5-7 Days Complete  Home Care Screening Complete  Medication Review (RN CM) Complete

## 2023-04-15 NOTE — Discharge Summary (Signed)
Physician Discharge Summary  Leah Payne:[id number] DOB: 1946/05/01 DOA: 04/11/2023  PCP: Dennard Nip, NP  Admit date: 04/11/2023 Discharge date: 04/15/2023 Admitted From: SNF Disposition: SNF Recommendations for Outpatient Follow-up:  Follow up with urology in 1 to 2 weeks Check CBC and CMP in 3 days Closely monitor urine output.  Call patient's urologist if blood clots in urine or obstruction of Foley. Please follow up on the following pending results: None   Discharge Condition: Stable but guarded prognosis CODE STATUS: DNR/DNI   Hospital course 77 year old F with PMH of congenital hydrocephalus, seizure disorder, hematuria, overactive bladder, dementia, HTN, HLD, WPW syndrome and recent hospitalization at [facility] for hematuria for which he underwent CT urogram which was suspicious for neoplastic disease and was treated with CBI and discharged on 9/13 with Foley catheter and continued on home aspirin and Plavix, presented to [facility] with altered mental status and bloody diaper.  Per EMS, she was hypotensive and started on pressor.  Hemoglobin of 6.8.  Lactic acid 8.0.  K3.0.  Glucose 250.  She was resuscitated with IV fluid and 3 units of blood and admitted to ICU.  Reportedly, [facility] declined admission and she was admitted here after consultation with urology.   Hemoglobin improved to 13.2 and remained stable since then.  Still with gross hematuria but no blood clots.  She is also on IV ceftriaxone.  Discussed with on-call urology, Dr. Mena Goes on 9/19 who recommended continuing Foley catheter and outpatient follow-up.   See individual problem list below for more.   Problems addressed during this hospitalization  Hemorrhagic shock  secondary to gross hematuria in the setting of aspirin and Plavix.  Still with gross hematuria but no blood clots.  H&H and renal function stable.  Discussed with in-house urology, Dr. Mena Goes on 9/19 who  recommended discharge with Foley catheter and outpatient follow-up with her urologist at [facility].  Patient has upcoming appointment on 05/04/2023.  She completed 5 days of IV ceftriaxone in house. Recent Labs    02/15/23 0423 02/15/23 2041 02/18/23 0304 04/11/23 1122 04/11/23 1128 04/11/23 2304 04/12/23 0336 04/13/23 0430 04/14/23 0255 04/15/23 0351  HGB 11.4* 12.8 11.2* 6.8* 6.8* 13.2 13.5 13.6 12.3 13.0  -Discontinued aspirin and Plavix -Called her urologist office if there is blood clot or obstruction of Foley catheter.   History of congenital hydrocephalus/epilepsy: Stable Alzheimer's dementia without behavioral disturbance: Oriented to self and place. -Continue home Keppra and Aricept. -Outpatient follow-up with her neurologist   History of CVA-she was on Plavix and aspirin.  It is unusual to continue DAPT indefinitely for CVA.  Given recurrent gross hematuria, discontinued Plavix and aspirin.   Hypothyroidism -Continue home Synthroid   Lactic acidosis/metabolic acidosis high anion gap: Resolved.   DM-2 ruled out.   Physical deconditioning: Unsure about mobility status at baseline. -PT/OT recommended return to SNF.   Hyperlipidemia: -Continue statins.   Mood disorder: -Continue home meds.   Hypokalemia/hypomagnesemia: Resolved.  Pressure skin injury: POA Pressure Injury 07/29/19 Buttocks Left Stage 2 -  Partial thickness loss of dermis presenting as a shallow open injury with a red, pink wound bed without slough. (Active)  07/29/19 2201  Location: Buttocks  Location Orientation: Left  Staging: Stage 2 -  Partial thickness loss of dermis presenting as a shallow open injury with a red, pink wound bed without slough.  Wound Description (Comments):   Present on Admission:     Time spent 35 minutes  Vital signs Vitals:  04/14/23 1600 04/14/23 1713 04/14/23 1931 04/15/23 0414  BP: 139/88 (!) 154/95 (!) 153/99 (!) 148/96  Pulse: 87 85 86 92  Temp: 99 F (37.2  C) 97.9 F (36.6 C) 98.6 F (37 C) 97.8 F (36.6 C)  Resp: 20  18 18   Height:      Weight:    50 kg  SpO2: 98% 100% 100% 100%  TempSrc: Bladder Oral Oral Oral  BMI (Calculated):    20.16     Discharge exam  GENERAL: No apparent distress.  Nontoxic. HEENT: MMM.  Vision and hearing grossly intact.  Hydrocephalus. NECK: Supple.  No apparent JVD.  RESP:  No IWOB.  Fair aeration bilaterally. CVS:  RRR. Heart sounds normal.  ABD/GI/GU: BS+. Abd soft, NTND.  Indwelling Foley.  Gross hematuria.  No blood clots. MSK/EXT:  Moves extremities.  Significant muscle mass and subcu fat loss. NEURO: Awake and alert.  Oriented to self, place and month but not situation.  No apparent focal neuro deficit. PSYCH: Calm. Normal affect.   Discharge Instructions  Allergies as of 04/15/2023   No Known Allergies      Medication List     STOP taking these medications    aspirin EC 81 MG tablet   clopidogrel 75 MG tablet Commonly known as: PLAVIX       TAKE these medications    Accu-Chek FastClix Lancets Misc Use as instructed to check blood sugar once daily   Accu-Chek Guide test strip Generic drug: glucose blood USE TO CHECK BLOOD SUGAR ONCE DAILY.   Accu-Chek Guide w/Device Kit 1 each by Does not apply route as directed. Use as instructed to check blood sugar once daily   atorvastatin 40 MG tablet Commonly known as: LIPITOR TAKE (1) TABLET BY MOUTH EVERY EVENING.   B-D SINGLE USE SWABS REGULAR Pads Use as instructed to check blood sugar once daily   donepezil 10 MG tablet Commonly known as: ARICEPT TAKE (1) TABLET BY MOUTH EVERY MORNING. What changed: See the new instructions.   levETIRAcetam 750 MG tablet Commonly known as: KEPPRA Take 1 tablet (750 mg total) by mouth 2 (two) times daily.   levothyroxine 25 MCG tablet Commonly known as: SYNTHROID TAKE (1) TABLET BY MOUTH ONCE DAILY BEFORE BREAKFAST. What changed: See the new instructions.   magnesium oxide 400 (240  Mg) MG tablet Commonly known as: MAG-OX Take 400 mg by mouth daily.   melatonin 5 MG Tabs Take 5 mg by mouth at bedtime.   [name] 50 MG Tb24 tablet Generic drug: mirabegron ER TAKE (1) TABLET BY MOUTH AT BEDTIME. What changed: See the new instructions.   sertraline 100 MG tablet Commonly known as: ZOLOFT TAKE (1) TABLET BY MOUTH EVERY MORNING.   [name] 8 MG Tb24 tablet Generic drug: fesoterodine TAKE (1) TABLET BY MOUTH ONCE DAILY.   Vitamin D 50 MCG (2000 UT) Caps Take 1 capsule (2,000 Units total) by mouth daily.        Consultations: Urology  Procedures/Studies:   [facility] Chest Port 1 View  Result Date: 04/11/2023 CLINICAL DATA:  Altered mental status. EXAM: PORTABLE CHEST 1 VIEW COMPARISON:  Chest radiographs 01/24/2023. FINDINGS: Rotated patient. Clear lungs. Stable cardiac and mediastinal contours. No pleural effusion or pneumothorax. Visualized bones and upper abdomen are unremarkable. IMPRESSION: No evidence of acute cardiopulmonary disease. Electronically Signed   By: Orvan Falconer M.D.   On: 04/11/2023 14:12       The results of significant diagnostics from this hospitalization (including imaging, microbiology,  ancillary and laboratory) are listed below for reference.     Microbiology: Recent Results (from the past 240 hour(s))  Blood Culture (routine x 2)     Status: None (Preliminary result)   Collection Time: 04/11/23 11:52 AM   Specimen: BLOOD  Result Value Ref Range Status   Specimen Description BLOOD BLOOD LEFT HAND  Final   Special Requests   Final    BOTTLES DRAWN AEROBIC AND ANAEROBIC Blood Culture results may not be optimal due to an inadequate volume of blood received in culture bottles   Culture   Final    NO GROWTH 4 DAYS Performed at [facility], [address], [place], [place] [address]    Report Status PENDING  Incomplete  Resp panel by RT-PCR (RSV, Flu A&B, Covid) Anterior Nasal Swab     Status: None   Collection Time: 04/11/23  12:07 PM   Specimen: Anterior Nasal Swab  Result Value Ref Range Status   SARS Coronavirus 2 by RT PCR NEGATIVE NEGATIVE Final    Comment: (NOTE) SARS-CoV-2 target nucleic acids are NOT DETECTED.  The SARS-CoV-2 RNA is generally detectable in upper respiratory specimens during the acute phase of infection. The lowest concentration of SARS-CoV-2 viral copies this assay can detect is 138 copies/mL. A negative result does not preclude SARS-Cov-2 infection and should not be used as the sole basis for treatment or other patient management decisions. A negative result may occur with  improper specimen collection/handling, submission of specimen other than nasopharyngeal swab, presence of viral mutation(s) within the areas targeted by this assay, and inadequate number of viral copies(<138 copies/mL). A negative result must be combined with clinical observations, patient history, and epidemiological information. The expected result is Negative.  Fact Sheet for Patients:  BloggerCourse.com  Fact Sheet for Healthcare Providers:  SeriousBroker.it  This test is no t yet approved or cleared by the [place] FDA and  has been authorized for detection and/or diagnosis of SARS-CoV-2 by FDA under an Emergency Use Authorization (EUA). This EUA will remain  in effect (meaning this test can be used) for the duration of the COVID-19 declaration under Section 564(b)(1) of the Act, 21 U.S.C.section 360bbb-3(b)(1), unless the authorization is terminated  or revoked sooner.       Influenza A by PCR NEGATIVE NEGATIVE Final   Influenza B by PCR NEGATIVE NEGATIVE Final    Comment: (NOTE) The Xpert Xpress SARS-CoV-2/FLU/RSV plus assay is intended as an aid in the diagnosis of influenza from Nasopharyngeal swab specimens and should not be used as a sole basis for treatment. Nasal washings and aspirates are unacceptable for Xpert Xpress  SARS-CoV-2/FLU/RSV testing.  Fact Sheet for Patients: BloggerCourse.com  Fact Sheet for Healthcare Providers: SeriousBroker.it  This test is not yet approved or cleared by the [place] FDA and has been authorized for detection and/or diagnosis of SARS-CoV-2 by FDA under an Emergency Use Authorization (EUA). This EUA will remain in effect (meaning this test can be used) for the duration of the COVID-19 declaration under Section 564(b)(1) of the Act, 21 U.S.C. section 360bbb-3(b)(1), unless the authorization is terminated or revoked.     Resp Syncytial Virus by PCR NEGATIVE NEGATIVE Final    Comment: (NOTE) Fact Sheet for Patients: BloggerCourse.com  Fact Sheet for Healthcare Providers: SeriousBroker.it  This test is not yet approved or cleared by the [place] FDA and has been authorized for detection and/or diagnosis of SARS-CoV-2 by FDA under an Emergency Use Authorization (EUA). This EUA will remain in  effect (meaning this test can be used) for the duration of the COVID-19 declaration under Section 564(b)(1) of the Act, 21 U.S.C. section 360bbb-3(b)(1), unless the authorization is terminated or revoked.  Performed at [facility], [address] [place], [place] [address]   MRSA Next Gen by PCR, Nasal     Status: None   Collection Time: 04/12/23  1:46 AM   Specimen: Nasal Mucosa; Nasal Swab  Result Value Ref Range Status   MRSA by PCR Next Gen NOT DETECTED NOT DETECTED Final    Comment: (NOTE) The GeneXpert MRSA Assay (FDA approved for NASAL specimens only), is one component of a comprehensive MRSA colonization surveillance program. It is not intended to diagnose MRSA infection nor to guide or monitor treatment for MRSA infections. Test performance is not FDA approved in patients less than 42 years old. Performed at [facility], [address], [place], [place] [address]      Labs:  CBC: Recent Labs  Lab 04/11/23 1128 04/11/23 2304 04/12/23 0336 04/13/23 0430 04/14/23 0255 04/15/23 0351  WBC 7.0  --  9.7 8.7 6.0 5.1  NEUTROABS 3.6  --   --   --   --   --   HGB 6.8* 13.2 13.5 13.6 12.3 13.0  HCT 21.0* 37.7 39.8 41.0 38.3 39.8  MCV 93.3  --  85.0 88.0 90.5 88.4  PLT 351  --  204 224 163 190   BMP &GFR Recent Labs  Lab 04/11/23 1128 04/12/23 0336 04/13/23 0257 04/14/23 0255 04/15/23 0351  NA 137 137 141 135 136  K 3.0* 3.2* 4.0 2.7* 3.7  CL 99 104 109 106 105  CO2 21* 23 25 22 24   GLUCOSE 245* 88 101* 72 83  BUN 12 13 10  6* 7*  CREATININE 0.94 0.63 0.48 0.38* 0.47  CALCIUM 7.8* 7.9* 8.0* 7.4* 7.6*  MG  --  2.1  --  1.6* 1.9  PHOS  --   --   --  2.5 2.3*   Estimated Creatinine Clearance: 45.7 mL/min (by C-G formula based on SCr of 0.47 mg/dL). Liver & Pancreas: Recent Labs  Lab 04/11/23 1128 04/14/23 0255 04/15/23 0351  AST 38  --   --   ALT 13  --   --   ALKPHOS 51  --   --   BILITOT 0.4  --   --   PROT 4.6*  --   --   ALBUMIN 2.1* 1.8* 2.1*   No results for input(s): "LIPASE", "AMYLASE" in the last 168 hours. No results for input(s): "AMMONIA" in the last 168 hours. Diabetic: No results for input(s): "HGBA1C" in the last 72 hours. Recent Labs  Lab 04/13/23 1638 04/13/23 2159 04/14/23 0819 04/14/23 1136 04/14/23 1632  GLUCAP 70 82 88 88 87   Cardiac Enzymes: No results for input(s): "CKTOTAL", "CKMB", "CKMBINDEX", "TROPONINI" in the last 168 hours. No results for input(s): "PROBNP" in the last 8760 hours. Coagulation Profile: Recent Labs  Lab 04/11/23 1128  INR 1.2   Thyroid Function Tests: No results for input(s): "TSH", "T4TOTAL", "FREET4", "T3FREE", "THYROIDAB" in the last 72 hours. Lipid Profile: No results for input(s): "CHOL", "HDL", "LDLCALC", "TRIG", "CHOLHDL", "LDLDIRECT" in the last 72 hours. Anemia Panel: No results for input(s): "VITAMINB12", "FOLATE",  "FERRITIN", "TIBC", "IRON", "RETICCTPCT" in the last 72 hours. Urine analysis:    Component Value Date/Time   COLORURINE RED (A) 04/11/2023 1201   APPEARANCEUR TURBID (A) 04/11/2023 1201   LABSPEC  04/11/2023 1201  TEST NOT REPORTED DUE TO COLOR INTERFERENCE OF URINE PIGMENT   PHURINE  04/11/2023 1201    TEST NOT REPORTED DUE TO COLOR INTERFERENCE OF URINE PIGMENT   GLUCOSEU (A) 04/11/2023 1201    TEST NOT REPORTED DUE TO COLOR INTERFERENCE OF URINE PIGMENT   HGBUR NEGATIVE 04/11/2023 1201   BILIRUBINUR (A) 04/11/2023 1201    TEST NOT REPORTED DUE TO COLOR INTERFERENCE OF URINE PIGMENT   BILIRUBINUR neg 03/07/2018 1020   KETONESUR (A) 04/11/2023 1201    TEST NOT REPORTED DUE TO COLOR INTERFERENCE OF URINE PIGMENT   PROTEINUR NEGATIVE 04/11/2023 1201   UROBILINOGEN 0.2 03/07/2018 1020   UROBILINOGEN 1.0 08/29/2014 1206   NITRITE (A) 04/11/2023 1201    TEST NOT REPORTED DUE TO COLOR INTERFERENCE OF URINE PIGMENT   LEUKOCYTESUR (A) 04/11/2023 1201    TEST NOT REPORTED DUE TO COLOR INTERFERENCE OF URINE PIGMENT   Sepsis Labs: Invalid input(s): "PROCALCITONIN", "LACTICIDVEN"   SIGNED:  Almon Hercules, MD  [facility] 04/15/2023, 12:18 PM

## 2023-04-15 NOTE — Progress Notes (Signed)
Pt left hospital via Ambulance([facility]) back to [facility]. Facility was called and notified of pt's discharge.

## 2023-04-15 NOTE — Plan of Care (Signed)

## 2023-04-15 NOTE — Plan of Care (Signed)
Patient stable for discharge as per MD order. Report given to Quita Skye at [facility]. Pending PTAR pick up. Patient with no signs or symptoms of acute distress at this time.  Problem: Education: Goal: Knowledge of General Education information will improve Description: Including pain rating scale, medication(s)/side effects and non-pharmacologic comfort measures Outcome: Adequate for Discharge   Problem: Health Behavior/Discharge Planning: Goal: Ability to manage health-related needs will improve Outcome: Adequate for Discharge   Problem: Clinical Measurements: Goal: Ability to maintain clinical measurements within normal limits will improve Outcome: Adequate for Discharge Goal: Will remain free from infection Outcome: Adequate for Discharge Goal: Diagnostic test results will improve Outcome: Adequate for Discharge Goal: Respiratory complications will improve Outcome: Adequate for Discharge Goal: Cardiovascular complication will be avoided Outcome: Adequate for Discharge   Problem: Activity: Goal: Risk for activity intolerance will decrease Outcome: Adequate for Discharge   Problem: Nutrition: Goal: Adequate nutrition will be maintained Outcome: Adequate for Discharge   Problem: Coping: Goal: Level of anxiety will decrease Outcome: Adequate for Discharge   Problem: Elimination: Goal: Will not experience complications related to bowel motility Outcome: Adequate for Discharge Goal: Will not experience complications related to urinary retention Outcome: Adequate for Discharge   Problem: Pain Managment: Goal: General experience of comfort will improve Outcome: Adequate for Discharge   Problem: Safety: Goal: Ability to remain free from injury will improve Outcome: Adequate for Discharge   Problem: Skin Integrity: Goal: Risk for impaired skin integrity will decrease Outcome: Adequate for Discharge   Problem: Education: Goal: Ability to describe self-care  measures that may prevent or decrease complications (Diabetes Survival Skills Education) will improve Outcome: Adequate for Discharge Goal: Individualized Educational Video(s) Outcome: Adequate for Discharge   Problem: Coping: Goal: Ability to adjust to condition or change in health will improve Outcome: Adequate for Discharge   Problem: Fluid Volume: Goal: Ability to maintain a balanced intake and output will improve Outcome: Adequate for Discharge   Problem: Health Behavior/Discharge Planning: Goal: Ability to identify and utilize available resources and services will improve Outcome: Adequate for Discharge Goal: Ability to manage health-related needs will improve Outcome: Adequate for Discharge   Problem: Metabolic: Goal: Ability to maintain appropriate glucose levels will improve Outcome: Adequate for Discharge   Problem: Nutritional: Goal: Maintenance of adequate nutrition will improve Outcome: Adequate for Discharge Goal: Progress toward achieving an optimal weight will improve Outcome: Adequate for Discharge   Problem: Skin Integrity: Goal: Risk for impaired skin integrity will decrease Outcome: Adequate for Discharge   Problem: Tissue Perfusion: Goal: Adequacy of tissue perfusion will improve Outcome: Adequate for Discharge

## 2023-04-16 LAB — CULTURE, BLOOD (ROUTINE X 2): Culture: NO GROWTH

## 2023-04-18 DIAGNOSIS — N139 Obstructive and reflux uropathy, unspecified: Secondary | ICD-10-CM | POA: Diagnosis not present

## 2023-04-18 DIAGNOSIS — Z978 Presence of other specified devices: Secondary | ICD-10-CM | POA: Diagnosis not present

## 2023-04-18 DIAGNOSIS — Z79899 Other long term (current) drug therapy: Secondary | ICD-10-CM | POA: Diagnosis not present

## 2023-04-18 DIAGNOSIS — M79641 Pain in right hand: Secondary | ICD-10-CM | POA: Diagnosis not present

## 2023-04-18 DIAGNOSIS — D62 Acute posthemorrhagic anemia: Secondary | ICD-10-CM | POA: Diagnosis not present

## 2023-04-18 DIAGNOSIS — R319 Hematuria, unspecified: Secondary | ICD-10-CM | POA: Diagnosis not present

## 2023-04-18 DIAGNOSIS — R310 Gross hematuria: Secondary | ICD-10-CM | POA: Diagnosis not present

## 2023-04-18 DIAGNOSIS — R578 Other shock: Secondary | ICD-10-CM | POA: Diagnosis not present

## 2023-04-18 DIAGNOSIS — E872 Acidosis, unspecified: Secondary | ICD-10-CM | POA: Diagnosis not present

## 2023-04-19 DIAGNOSIS — R578 Other shock: Secondary | ICD-10-CM | POA: Diagnosis not present

## 2023-04-19 DIAGNOSIS — N139 Obstructive and reflux uropathy, unspecified: Secondary | ICD-10-CM | POA: Diagnosis not present

## 2023-04-19 DIAGNOSIS — E039 Hypothyroidism, unspecified: Secondary | ICD-10-CM | POA: Diagnosis not present

## 2023-04-19 DIAGNOSIS — E872 Acidosis, unspecified: Secondary | ICD-10-CM | POA: Diagnosis not present

## 2023-04-19 DIAGNOSIS — E785 Hyperlipidemia, unspecified: Secondary | ICD-10-CM | POA: Diagnosis not present

## 2023-04-19 DIAGNOSIS — I6350 Cerebral infarction due to unspecified occlusion or stenosis of unspecified cerebral artery: Secondary | ICD-10-CM | POA: Diagnosis not present

## 2023-04-19 DIAGNOSIS — R310 Gross hematuria: Secondary | ICD-10-CM | POA: Diagnosis not present

## 2023-04-19 DIAGNOSIS — Z978 Presence of other specified devices: Secondary | ICD-10-CM | POA: Diagnosis not present

## 2023-04-19 DIAGNOSIS — D62 Acute posthemorrhagic anemia: Secondary | ICD-10-CM | POA: Diagnosis not present

## 2023-04-19 DIAGNOSIS — E119 Type 2 diabetes mellitus without complications: Secondary | ICD-10-CM | POA: Diagnosis not present

## 2023-04-20 DIAGNOSIS — Z79899 Other long term (current) drug therapy: Secondary | ICD-10-CM | POA: Diagnosis not present

## 2023-04-20 DIAGNOSIS — R310 Gross hematuria: Secondary | ICD-10-CM | POA: Diagnosis not present

## 2023-04-20 DIAGNOSIS — E8809 Other disorders of plasma-protein metabolism, not elsewhere classified: Secondary | ICD-10-CM | POA: Diagnosis not present

## 2023-04-20 LAB — BPAM RBC
Blood Product Expiration Date: 202410042359
Blood Product Expiration Date: 202410132359
Blood Product Expiration Date: 202410202359
ISSUE DATE / TIME: 202409171142
ISSUE DATE / TIME: 202409171541
ISSUE DATE / TIME: 202409171806
Unit Type and Rh: 6200
Unit Type and Rh: 6200
Unit Type and Rh: 9500

## 2023-04-20 LAB — TYPE AND SCREEN
ABO/RH(D): A POS
Antibody Screen: NEGATIVE
Unit division: 0
Unit division: 0
Unit division: 0

## 2023-04-25 ENCOUNTER — Ambulatory Visit (INDEPENDENT_AMBULATORY_CARE_PROVIDER_SITE_OTHER): Payer: 59 | Admitting: Neurology

## 2023-04-25 ENCOUNTER — Encounter: Payer: Self-pay | Admitting: Neurology

## 2023-04-25 VITALS — BP 112/75 | HR 102 | Ht 62.0 in | Wt 110.2 lb

## 2023-04-25 DIAGNOSIS — Q039 Congenital hydrocephalus, unspecified: Secondary | ICD-10-CM

## 2023-04-25 DIAGNOSIS — E538 Deficiency of other specified B group vitamins: Secondary | ICD-10-CM

## 2023-04-25 DIAGNOSIS — F03B0 Unspecified dementia, moderate, without behavioral disturbance, psychotic disturbance, mood disturbance, and anxiety: Secondary | ICD-10-CM

## 2023-04-25 DIAGNOSIS — G40909 Epilepsy, unspecified, not intractable, without status epilepticus: Secondary | ICD-10-CM

## 2023-04-25 DIAGNOSIS — I639 Cerebral infarction, unspecified: Secondary | ICD-10-CM | POA: Diagnosis not present

## 2023-04-25 DIAGNOSIS — Z978 Presence of other specified devices: Secondary | ICD-10-CM | POA: Diagnosis not present

## 2023-04-25 DIAGNOSIS — G919 Hydrocephalus, unspecified: Secondary | ICD-10-CM | POA: Diagnosis not present

## 2023-04-25 DIAGNOSIS — R319 Hematuria, unspecified: Secondary | ICD-10-CM | POA: Diagnosis not present

## 2023-04-25 NOTE — Progress Notes (Signed)
[facility]  PATIENT: Leah Payne DOB: 28-Dec-1945  REQUESTING CLINICIAN: Charlsie Quest, MD HISTORY FROM: Chart review  REASON FOR VISIT: Seizure/Dementia    HISTORICAL  CHIEF COMPLAINT:  Chief Complaint  Patient presents with   Hospitalization Follow-up    Rm12, caregiver present (logan), internal hospital referral for breakthrough seizures, stroke, neurocognitive d/o: MMSE 17/30    HISTORY OF PRESENT ILLNESS:  This is a 77 year old woman with past medical history of congenital hydrocephalus, previous history of stroke, seizures, dementia, recent admission to the hospital for hematuria who is presenting after recent hospitalization.  Patient currently lives in a nursing home with memory care.  She is accompanied by her caregiver Logan.  He tells me that patient has not had any seizures or seizure activity since residing at the facility during the past 4 months.  She is compliant with her Keppra 750 mg twice daily.   In terms of the dementia, he notes that she is not agitated, does not try to leave the facility.  The main thing that he notices is that patient is repetitive. She was recently admitted to the hospital for hematuria, upon discharge her Plavix and aspirin was discontinued awaiting urology follow-up.  Back in July patient was admitted and found to have stroke.  During her workup she was also noted to have intracranial atherosclerosis, aspirin and Plavix was started then. They tell me that the hematuria is present but improved.  No other issue reported.    OTHER MEDICAL CONDITIONS: Congenital hydrocephalus, Dementia, Seizure, hematuria, Previous CVA    REVIEW OF SYSTEMS: Full 14 system review of systems performed and negative with exception of: Unable to fully obtain due to dementia   ALLERGIES: No Known Allergies  HOME MEDICATIONS: Outpatient Medications Prior to Visit  Medication Sig Dispense Refill   atorvastatin (LIPITOR) 40 MG  tablet TAKE (1) TABLET BY MOUTH EVERY EVENING. 30 tablet 10   calcium carbonate (OSCAL) 1500 (600 Ca) MG TABS tablet Take 600 mg of elemental calcium by mouth daily with breakfast.     Cholecalciferol (VITAMIN D) 50 MCG (2000 UT) CAPS Take 1 capsule (2,000 Units total) by mouth daily. 30 capsule    donepezil (ARICEPT) 10 MG tablet TAKE (1) TABLET BY MOUTH EVERY MORNING. (Patient taking differently: Take 10 mg by mouth at bedtime.) 30 tablet 10   levETIRAcetam (KEPPRA) 750 MG tablet Take 1 tablet (750 mg total) by mouth 2 (two) times daily. 60 tablet 0   levothyroxine (SYNTHROID) 25 MCG tablet TAKE (1) TABLET BY MOUTH ONCE DAILY BEFORE BREAKFAST. (Patient taking differently: Take 25 mcg by mouth daily before breakfast.) 30 tablet 10   magnesium oxide (MAG-OX) 400 (240 Mg) MG tablet Take 400 mg by mouth daily.     melatonin 5 MG TABS Take 5 mg by mouth at bedtime.     [name] 50 MG TB24 tablet TAKE (1) TABLET BY MOUTH AT BEDTIME. (Patient taking differently: Take 50 mg by mouth daily.) 30 tablet 10   sertraline (ZOLOFT) 100 MG tablet TAKE (1) TABLET BY MOUTH EVERY MORNING. 30 tablet 10   [name] 8 MG TB24 tablet TAKE (1) TABLET BY MOUTH ONCE DAILY. 30 tablet 10   Accu-Chek FastClix Lancets MISC Use as instructed to check blood sugar once daily 100 each 1   Alcohol Swabs (B-D SINGLE USE SWABS REGULAR) PADS Use as instructed to check blood sugar once daily 100 each 1   Blood Glucose Monitoring Suppl (ACCU-CHEK GUIDE) w/Device KIT 1 each by  Does not apply route as directed. Use as instructed to check blood sugar once daily 1 kit 0   glucose blood (ACCU-CHEK GUIDE) test strip USE TO CHECK BLOOD SUGAR ONCE DAILY. 50 strip 3   No facility-administered medications prior to visit.    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) - saw Dr Gerlene Fee - no benefit from shunt placement    Hyperlipidemia 08/04/2013   Personal history of transient cerebral ischemia    Preaxial polydactyly of right hand congenital   Seizure disorder (HCC) 08/04/2013   WPW (Wolff-Parkinson-White syndrome)    has seen Dr Donnie Aho    PAST SURGICAL HISTORY: Past Surgical History:  Procedure Laterality Date   CHOLECYSTECTOMY  1978    FAMILY HISTORY: Family History  Problem Relation Age of Onset   Stroke Mother 63   Pneumonia Mother    CAD Father 60   Alzheimer's disease Father    Pneumonia Father    Cancer Maternal Aunt        lung (non-smoker)   Diabetes Neg Hx     SOCIAL HISTORY: Social History   Socioeconomic History   Marital status: Married    Spouse name: Not on file   Number of children: Not on file   Years of education: Not on file   Highest education level: High school graduate  Occupational History   Not on file  Tobacco Use   Smoking status: Never   Smokeless tobacco: Never  Vaping Use   Vaping status: Never Used  Substance and Sexual Activity   Alcohol use: No    Alcohol/week: 0.0 standard drinks of alcohol   Drug use: No   Sexual activity: Not Currently  Other Topics Concern   Not on file  Social History Narrative   Lives with husband, 2 dogs. Married x 58 years in 04/2021.   Moved from [facility]   Occupation: retired, was housewife   Edu: completed HS    Activity: no regular exercise. Dizziness precludes activity   Diet: good water, fruits/vegetables daily   Caffeine: 2 cups daily   Social Determinants of Health   Financial Resource Strain: Low Risk  (07/06/2021)    Overall Financial Resource Strain (CARDIA)    Difficulty of Paying Living Expenses: Not hard at all  Food Insecurity: No Food Insecurity (04/15/2023)   Hunger Vital Sign    Worried About Running Out of Food in the Last Year: Never true    Ran Out of Food in the Last Year: Never true  Transportation Needs: No Transportation Needs (04/15/2023)   PRAPARE - Administrator, Civil Service (Medical): No    Lack of Transportation (Non-Medical): No  Physical Activity: Inactive (07/06/2021)   Exercise Vital Sign    Days of Exercise per Week: 0 days    Minutes of Exercise per Session: 0 min  Stress: No Stress Concern Present (07/06/2021)   [employer] of Occupational Health - Occupational Stress Questionnaire  Feeling of Stress : Not at all  Social Connections: Socially Integrated (07/06/2021)   Social Connection and Isolation Panel [NHANES]    Frequency of Communication with Friends and Family: More than three times a week    Frequency of Social Gatherings with Friends and Family: Never    Attends Religious Services: More than 4 times per year    Active Member of [employer] or Organizations: Yes    Attends Engineer, structural: More than 4 times per year    Marital Status: Married  Catering manager Violence: Not At Risk (04/15/2023)   Humiliation, Afraid, Rape, and Kick questionnaire    Fear of Current or Ex-Partner: No    Emotionally Abused: No    Physically Abused: No    Sexually Abused: No    PHYSICAL EXAM  GENERAL EXAM/CONSTITUTIONAL: Vitals:  Vitals:   04/25/23 0942  BP: 112/75  Pulse: (!) 102  Weight: 110 lb 3.7 oz (50 kg)  Height: 5\' 2"  (1.575 m)   Body mass index is 20.16 kg/m. Wt Readings from Last 3 Encounters:  04/25/23 110 lb 3.7 oz (50 kg)  04/15/23 110 lb 3.7 oz (50 kg)  02/17/23 115 lb 1.3 oz (52.2 kg)   Patient is in no distress; well developed, nourished and groomed; neck is supple. Macrocephaly   MUSCULOSKELETAL: Gait, strength, tone,  movements noted in Neurologic exam below  NEUROLOGIC: MENTAL STATUS:     04/25/2023    9:55 AM 01/07/2019    8:29 AM 03/27/2018    1:52 PM  MMSE - Mini Mental State Exam  Orientation to time 4 5 3   Orientation to Place 1 5 4   Registration 3 3 3   Attention/ Calculation 2 0 5  Recall 0 2 3  Recall-comments  unable to recall 1 of 3 words   Language- name 2 objects 2 0 2  Language- repeat 1 1 1   Language- follow 3 step command 3 0 2  Language- read & follow direction 1 0 1  Write a sentence 0 0 1  Copy design 0 0 0  Total score 17 16 25     CRANIAL NERVE:  2nd, 3rd, 4th, 6th - Visual fields full to confrontation, extraocular muscles intact, no nystagmus 5th - facial sensation symmetric 7th - facial strength symmetric 8th - hearing intact 9th - palate elevates symmetrically, uvula midline 11th - shoulder shrug symmetric 12th - tongue protrusion midline  MOTOR:  normal bulk and tone, Bilateral upper extremities antigravity, right worse then left. It is also limited by pain. BLE at least antigravity   SENSORY:  normal and symmetric to light touch  GAIT/STATION:  Deferred   DIAGNOSTIC DATA (LABS, IMAGING, TESTING) - I reviewed patient records, labs, notes, testing and imaging myself where available.  Lab Results  Component Value Date   WBC 5.1 04/15/2023   HGB 13.0 04/15/2023   HCT 39.8 04/15/2023   MCV 88.4 04/15/2023   PLT 190 04/15/2023      Component Value Date/Time   NA 136 04/15/2023 0351   NA 134 03/10/2022 1008   K 3.7 04/15/2023 0351   CL 105 04/15/2023 0351   CO2 24 04/15/2023 0351   GLUCOSE 83 04/15/2023 0351   BUN 7 (L) 04/15/2023 0351   BUN 9 03/10/2022 1008   CREATININE 0.47 04/15/2023 0351   CALCIUM 7.6 (L) 04/15/2023 0351   PROT 4.6 (L) 04/11/2023 1128   PROT 7.1 03/10/2022 1008   ALBUMIN 2.1 (L) 04/15/2023 0351   ALBUMIN 4.4 03/10/2022  1008   AST 38 04/11/2023 1128   AST 21 03/27/2015 0000   ALT 13 04/11/2023 1128   ALT 29 03/27/2015 0000    ALKPHOS 51 04/11/2023 1128   ALKPHOS 70 03/27/2015 0000   BILITOT 0.4 04/11/2023 1128   BILITOT 0.3 03/10/2022 1008   BILITOT 0.7 03/27/2015 0000   GFRNONAA >60 04/15/2023 0351   GFRAA >60 07/26/2019 1007   Lab Results  Component Value Date   CHOL 123 02/18/2023   HDL 35 (L) 02/18/2023   LDLCALC 76 02/18/2023   LDLDIRECT 100.0 09/17/2015   TRIG 62 02/18/2023   CHOLHDL 3.5 02/18/2023   Lab Results  Component Value Date   HGBA1C 5.6 02/15/2023   Lab Results  Component Value Date   VITAMINB12 167 (L) 02/15/2023   Lab Results  Component Value Date   TSH 1.691 02/15/2023   MRI Brain 02/15/2023 1. Acute infarct in the right middle frontal gyrus. No acute hemorrhage or significant mass effect. 2. Stable severe dilation of the third and lateral ventricles, consistent with aqueductal stenosis.  MRA head and neck 02/15/2023  Head: 1. Negative intracranial MRA for large vessel occlusion. 2. 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 3. Otherwise wide patency of the major intracranial arterial circulation.  Neck: 1. Atheromatous irregularity about the carotid bifurcations without hemodynamically significant greater than 50% stenosis. 2. Patent antegrade flow within both vertebral arteries within the neck. Right vertebral artery dominant. 3. Tortuosity of the major arterial vasculature of the neck, suggesting chronic underlying hypertension    ASSESSMENT AND PLAN  77 y.o. year old female with congenital hydrocephalus, previous history of stroke, seizures, dementia, recent admission to the hospital for hematuria who is presenting for follow-up.  In term of the dementia, Whitney Post tells me that she is stable, she is compliant with her medications including Aricept. In terms of the seizures, no seizure or seizure activity.  She is compliant with her Keppra 750 mg twice  daily.  Plan for now is to continue medications as directed.   When it comes to holding the aspirin and Plavix and hematuria, I have informed them to follow-up with urology as scheduled and once cleared from an urology standpoint to restart at least aspirin from stroke prevention.  Patient is known to have intracranial atherosclerosis.  Continue to follow with PCP and return as needed.   1. 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HCC)   2. Moderate dementia without behavioral disturbance, psychotic disturbance, mood disturbance, or anxiety, unspecified dementia type (HCC)   3. Seizure disorder (HCC)   4. Cerebrovascular accident (CVA), unspecified mechanism (HCC)   5. Vitamin B12 deficiency      Patient Instructions  Continue current medications  Follow up with Urology and if cleared, please restart Aspirin 81 mg for stroke prevention as you have intracranial atherosclerosis.  Start Vitamin B12 supplementation  Continue to follow up with PCP  Return as needed   No orders of the defined types were placed in this encounter.   No orders of the defined types were placed in this encounter.   Return if symptoms worsen or fail to improve.    Windell Norfolk, MD 04/25/2023, 10:41 AM  [facility] [address] [place], [place] [address] [phone]

## 2023-04-25 NOTE — Patient Instructions (Addendum)
Continue current medications  Follow up with Urology and if cleared, please restart Aspirin 81 mg for stroke prevention as you have intracranial atherosclerosis.  Start Vitamin B12 supplementation  Continue to follow up with PCP  Return as needed

## 2023-04-26 DIAGNOSIS — M6281 Muscle weakness (generalized): Secondary | ICD-10-CM | POA: Diagnosis not present

## 2023-04-26 DIAGNOSIS — R2689 Other abnormalities of gait and mobility: Secondary | ICD-10-CM | POA: Diagnosis not present

## 2023-04-26 DIAGNOSIS — R1312 Dysphagia, oropharyngeal phase: Secondary | ICD-10-CM | POA: Diagnosis not present

## 2023-04-26 DIAGNOSIS — R262 Difficulty in walking, not elsewhere classified: Secondary | ICD-10-CM | POA: Diagnosis not present

## 2023-04-27 DIAGNOSIS — R2689 Other abnormalities of gait and mobility: Secondary | ICD-10-CM | POA: Diagnosis not present

## 2023-04-27 DIAGNOSIS — M6281 Muscle weakness (generalized): Secondary | ICD-10-CM | POA: Diagnosis not present

## 2023-04-27 DIAGNOSIS — R1312 Dysphagia, oropharyngeal phase: Secondary | ICD-10-CM | POA: Diagnosis not present

## 2023-04-27 DIAGNOSIS — R262 Difficulty in walking, not elsewhere classified: Secondary | ICD-10-CM | POA: Diagnosis not present

## 2023-04-28 DIAGNOSIS — R1312 Dysphagia, oropharyngeal phase: Secondary | ICD-10-CM | POA: Diagnosis not present

## 2023-04-28 DIAGNOSIS — R2689 Other abnormalities of gait and mobility: Secondary | ICD-10-CM | POA: Diagnosis not present

## 2023-04-28 DIAGNOSIS — R262 Difficulty in walking, not elsewhere classified: Secondary | ICD-10-CM | POA: Diagnosis not present

## 2023-04-28 DIAGNOSIS — M6281 Muscle weakness (generalized): Secondary | ICD-10-CM | POA: Diagnosis not present

## 2023-05-01 DIAGNOSIS — R1312 Dysphagia, oropharyngeal phase: Secondary | ICD-10-CM | POA: Diagnosis not present

## 2023-05-01 DIAGNOSIS — R262 Difficulty in walking, not elsewhere classified: Secondary | ICD-10-CM | POA: Diagnosis not present

## 2023-05-01 DIAGNOSIS — M6281 Muscle weakness (generalized): Secondary | ICD-10-CM | POA: Diagnosis not present

## 2023-05-01 DIAGNOSIS — R2689 Other abnormalities of gait and mobility: Secondary | ICD-10-CM | POA: Diagnosis not present

## 2023-05-02 DIAGNOSIS — R262 Difficulty in walking, not elsewhere classified: Secondary | ICD-10-CM | POA: Diagnosis not present

## 2023-05-02 DIAGNOSIS — R1312 Dysphagia, oropharyngeal phase: Secondary | ICD-10-CM | POA: Diagnosis not present

## 2023-05-02 DIAGNOSIS — R2689 Other abnormalities of gait and mobility: Secondary | ICD-10-CM | POA: Diagnosis not present

## 2023-05-02 DIAGNOSIS — M6281 Muscle weakness (generalized): Secondary | ICD-10-CM | POA: Diagnosis not present

## 2023-05-03 DIAGNOSIS — R262 Difficulty in walking, not elsewhere classified: Secondary | ICD-10-CM | POA: Diagnosis not present

## 2023-05-03 DIAGNOSIS — R1312 Dysphagia, oropharyngeal phase: Secondary | ICD-10-CM | POA: Diagnosis not present

## 2023-05-03 DIAGNOSIS — R2689 Other abnormalities of gait and mobility: Secondary | ICD-10-CM | POA: Diagnosis not present

## 2023-05-03 DIAGNOSIS — M6281 Muscle weakness (generalized): Secondary | ICD-10-CM | POA: Diagnosis not present

## 2023-05-04 DIAGNOSIS — R1312 Dysphagia, oropharyngeal phase: Secondary | ICD-10-CM | POA: Diagnosis not present

## 2023-05-04 DIAGNOSIS — R2689 Other abnormalities of gait and mobility: Secondary | ICD-10-CM | POA: Diagnosis not present

## 2023-05-04 DIAGNOSIS — M6281 Muscle weakness (generalized): Secondary | ICD-10-CM | POA: Diagnosis not present

## 2023-05-04 DIAGNOSIS — R262 Difficulty in walking, not elsewhere classified: Secondary | ICD-10-CM | POA: Diagnosis not present

## 2023-05-04 DIAGNOSIS — R339 Retention of urine, unspecified: Secondary | ICD-10-CM | POA: Diagnosis not present

## 2023-05-04 DIAGNOSIS — R310 Gross hematuria: Secondary | ICD-10-CM | POA: Diagnosis not present

## 2023-05-05 DIAGNOSIS — R2689 Other abnormalities of gait and mobility: Secondary | ICD-10-CM | POA: Diagnosis not present

## 2023-05-05 DIAGNOSIS — R1312 Dysphagia, oropharyngeal phase: Secondary | ICD-10-CM | POA: Diagnosis not present

## 2023-05-05 DIAGNOSIS — R262 Difficulty in walking, not elsewhere classified: Secondary | ICD-10-CM | POA: Diagnosis not present

## 2023-05-05 DIAGNOSIS — M6281 Muscle weakness (generalized): Secondary | ICD-10-CM | POA: Diagnosis not present

## 2023-05-08 DIAGNOSIS — R2689 Other abnormalities of gait and mobility: Secondary | ICD-10-CM | POA: Diagnosis not present

## 2023-05-08 DIAGNOSIS — R262 Difficulty in walking, not elsewhere classified: Secondary | ICD-10-CM | POA: Diagnosis not present

## 2023-05-08 DIAGNOSIS — R1312 Dysphagia, oropharyngeal phase: Secondary | ICD-10-CM | POA: Diagnosis not present

## 2023-05-08 DIAGNOSIS — M6281 Muscle weakness (generalized): Secondary | ICD-10-CM | POA: Diagnosis not present

## 2023-05-09 DIAGNOSIS — R262 Difficulty in walking, not elsewhere classified: Secondary | ICD-10-CM | POA: Diagnosis not present

## 2023-05-09 DIAGNOSIS — R1312 Dysphagia, oropharyngeal phase: Secondary | ICD-10-CM | POA: Diagnosis not present

## 2023-05-09 DIAGNOSIS — R2689 Other abnormalities of gait and mobility: Secondary | ICD-10-CM | POA: Diagnosis not present

## 2023-05-09 DIAGNOSIS — M6281 Muscle weakness (generalized): Secondary | ICD-10-CM | POA: Diagnosis not present

## 2023-05-10 DIAGNOSIS — R2689 Other abnormalities of gait and mobility: Secondary | ICD-10-CM | POA: Diagnosis not present

## 2023-05-10 DIAGNOSIS — R262 Difficulty in walking, not elsewhere classified: Secondary | ICD-10-CM | POA: Diagnosis not present

## 2023-05-10 DIAGNOSIS — M6281 Muscle weakness (generalized): Secondary | ICD-10-CM | POA: Diagnosis not present

## 2023-05-10 DIAGNOSIS — R1312 Dysphagia, oropharyngeal phase: Secondary | ICD-10-CM | POA: Diagnosis not present

## 2023-05-11 DIAGNOSIS — R2689 Other abnormalities of gait and mobility: Secondary | ICD-10-CM | POA: Diagnosis not present

## 2023-05-11 DIAGNOSIS — R262 Difficulty in walking, not elsewhere classified: Secondary | ICD-10-CM | POA: Diagnosis not present

## 2023-05-11 DIAGNOSIS — Z79899 Other long term (current) drug therapy: Secondary | ICD-10-CM | POA: Diagnosis not present

## 2023-05-11 DIAGNOSIS — M6281 Muscle weakness (generalized): Secondary | ICD-10-CM | POA: Diagnosis not present

## 2023-05-11 DIAGNOSIS — R1312 Dysphagia, oropharyngeal phase: Secondary | ICD-10-CM | POA: Diagnosis not present

## 2023-05-12 DIAGNOSIS — R1312 Dysphagia, oropharyngeal phase: Secondary | ICD-10-CM | POA: Diagnosis not present

## 2023-05-12 DIAGNOSIS — R262 Difficulty in walking, not elsewhere classified: Secondary | ICD-10-CM | POA: Diagnosis not present

## 2023-05-12 DIAGNOSIS — R2689 Other abnormalities of gait and mobility: Secondary | ICD-10-CM | POA: Diagnosis not present

## 2023-05-12 DIAGNOSIS — M6281 Muscle weakness (generalized): Secondary | ICD-10-CM | POA: Diagnosis not present

## 2023-05-15 DIAGNOSIS — M6281 Muscle weakness (generalized): Secondary | ICD-10-CM | POA: Diagnosis not present

## 2023-05-15 DIAGNOSIS — R262 Difficulty in walking, not elsewhere classified: Secondary | ICD-10-CM | POA: Diagnosis not present

## 2023-05-15 DIAGNOSIS — R1312 Dysphagia, oropharyngeal phase: Secondary | ICD-10-CM | POA: Diagnosis not present

## 2023-05-15 DIAGNOSIS — R2689 Other abnormalities of gait and mobility: Secondary | ICD-10-CM | POA: Diagnosis not present

## 2023-05-16 DIAGNOSIS — M6281 Muscle weakness (generalized): Secondary | ICD-10-CM | POA: Diagnosis not present

## 2023-05-16 DIAGNOSIS — R1312 Dysphagia, oropharyngeal phase: Secondary | ICD-10-CM | POA: Diagnosis not present

## 2023-05-16 DIAGNOSIS — R262 Difficulty in walking, not elsewhere classified: Secondary | ICD-10-CM | POA: Diagnosis not present

## 2023-05-16 DIAGNOSIS — R2689 Other abnormalities of gait and mobility: Secondary | ICD-10-CM | POA: Diagnosis not present

## 2023-05-17 DIAGNOSIS — M6281 Muscle weakness (generalized): Secondary | ICD-10-CM | POA: Diagnosis not present

## 2023-05-17 DIAGNOSIS — R1312 Dysphagia, oropharyngeal phase: Secondary | ICD-10-CM | POA: Diagnosis not present

## 2023-05-17 DIAGNOSIS — R262 Difficulty in walking, not elsewhere classified: Secondary | ICD-10-CM | POA: Diagnosis not present

## 2023-05-17 DIAGNOSIS — R2689 Other abnormalities of gait and mobility: Secondary | ICD-10-CM | POA: Diagnosis not present

## 2023-05-18 DIAGNOSIS — N390 Urinary tract infection, site not specified: Secondary | ICD-10-CM | POA: Diagnosis not present

## 2023-05-18 DIAGNOSIS — M6281 Muscle weakness (generalized): Secondary | ICD-10-CM | POA: Diagnosis not present

## 2023-05-18 DIAGNOSIS — Z9189 Other specified personal risk factors, not elsewhere classified: Secondary | ICD-10-CM | POA: Diagnosis not present

## 2023-05-18 DIAGNOSIS — E860 Dehydration: Secondary | ICD-10-CM | POA: Diagnosis not present

## 2023-05-18 DIAGNOSIS — Z79899 Other long term (current) drug therapy: Secondary | ICD-10-CM | POA: Diagnosis not present

## 2023-05-18 DIAGNOSIS — R262 Difficulty in walking, not elsewhere classified: Secondary | ICD-10-CM | POA: Diagnosis not present

## 2023-05-18 DIAGNOSIS — R2689 Other abnormalities of gait and mobility: Secondary | ICD-10-CM | POA: Diagnosis not present

## 2023-05-18 DIAGNOSIS — R34 Anuria and oliguria: Secondary | ICD-10-CM | POA: Diagnosis not present

## 2023-05-18 DIAGNOSIS — R1312 Dysphagia, oropharyngeal phase: Secondary | ICD-10-CM | POA: Diagnosis not present

## 2023-05-18 DIAGNOSIS — Z978 Presence of other specified devices: Secondary | ICD-10-CM | POA: Diagnosis not present

## 2023-05-19 DIAGNOSIS — R1312 Dysphagia, oropharyngeal phase: Secondary | ICD-10-CM | POA: Diagnosis not present

## 2023-05-19 DIAGNOSIS — R2689 Other abnormalities of gait and mobility: Secondary | ICD-10-CM | POA: Diagnosis not present

## 2023-05-19 DIAGNOSIS — M6281 Muscle weakness (generalized): Secondary | ICD-10-CM | POA: Diagnosis not present

## 2023-05-19 DIAGNOSIS — R262 Difficulty in walking, not elsewhere classified: Secondary | ICD-10-CM | POA: Diagnosis not present

## 2023-05-22 DIAGNOSIS — E860 Dehydration: Secondary | ICD-10-CM | POA: Diagnosis not present

## 2023-05-22 DIAGNOSIS — R627 Adult failure to thrive: Secondary | ICD-10-CM | POA: Diagnosis not present

## 2023-05-22 DIAGNOSIS — M6281 Muscle weakness (generalized): Secondary | ICD-10-CM | POA: Diagnosis not present

## 2023-05-22 DIAGNOSIS — R262 Difficulty in walking, not elsewhere classified: Secondary | ICD-10-CM | POA: Diagnosis not present

## 2023-05-22 DIAGNOSIS — R2689 Other abnormalities of gait and mobility: Secondary | ICD-10-CM | POA: Diagnosis not present

## 2023-05-22 DIAGNOSIS — Z79899 Other long term (current) drug therapy: Secondary | ICD-10-CM | POA: Diagnosis not present

## 2023-05-22 DIAGNOSIS — R1312 Dysphagia, oropharyngeal phase: Secondary | ICD-10-CM | POA: Diagnosis not present

## 2023-05-22 DIAGNOSIS — E039 Hypothyroidism, unspecified: Secondary | ICD-10-CM | POA: Diagnosis not present

## 2023-05-22 DIAGNOSIS — N390 Urinary tract infection, site not specified: Secondary | ICD-10-CM | POA: Diagnosis not present

## 2023-05-23 DIAGNOSIS — Z79899 Other long term (current) drug therapy: Secondary | ICD-10-CM | POA: Diagnosis not present

## 2023-05-23 DIAGNOSIS — E441 Mild protein-calorie malnutrition: Secondary | ICD-10-CM | POA: Diagnosis not present

## 2023-05-23 DIAGNOSIS — E039 Hypothyroidism, unspecified: Secondary | ICD-10-CM | POA: Diagnosis not present

## 2023-05-23 DIAGNOSIS — R262 Difficulty in walking, not elsewhere classified: Secondary | ICD-10-CM | POA: Diagnosis not present

## 2023-05-23 DIAGNOSIS — M6281 Muscle weakness (generalized): Secondary | ICD-10-CM | POA: Diagnosis not present

## 2023-05-23 DIAGNOSIS — R2689 Other abnormalities of gait and mobility: Secondary | ICD-10-CM | POA: Diagnosis not present

## 2023-05-23 DIAGNOSIS — E860 Dehydration: Secondary | ICD-10-CM | POA: Diagnosis not present

## 2023-05-23 DIAGNOSIS — R1312 Dysphagia, oropharyngeal phase: Secondary | ICD-10-CM | POA: Diagnosis not present

## 2023-05-23 DIAGNOSIS — E876 Hypokalemia: Secondary | ICD-10-CM | POA: Diagnosis not present

## 2023-05-24 DIAGNOSIS — Z978 Presence of other specified devices: Secondary | ICD-10-CM | POA: Diagnosis not present

## 2023-05-24 DIAGNOSIS — M6281 Muscle weakness (generalized): Secondary | ICD-10-CM | POA: Diagnosis not present

## 2023-05-24 DIAGNOSIS — R2689 Other abnormalities of gait and mobility: Secondary | ICD-10-CM | POA: Diagnosis not present

## 2023-05-24 DIAGNOSIS — E860 Dehydration: Secondary | ICD-10-CM | POA: Diagnosis not present

## 2023-05-24 DIAGNOSIS — E441 Mild protein-calorie malnutrition: Secondary | ICD-10-CM | POA: Diagnosis not present

## 2023-05-24 DIAGNOSIS — N390 Urinary tract infection, site not specified: Secondary | ICD-10-CM | POA: Diagnosis not present

## 2023-05-24 DIAGNOSIS — E039 Hypothyroidism, unspecified: Secondary | ICD-10-CM | POA: Diagnosis not present

## 2023-05-24 DIAGNOSIS — R1312 Dysphagia, oropharyngeal phase: Secondary | ICD-10-CM | POA: Diagnosis not present

## 2023-05-24 DIAGNOSIS — R262 Difficulty in walking, not elsewhere classified: Secondary | ICD-10-CM | POA: Diagnosis not present

## 2023-05-24 DIAGNOSIS — R627 Adult failure to thrive: Secondary | ICD-10-CM | POA: Diagnosis not present

## 2023-05-25 DIAGNOSIS — M6281 Muscle weakness (generalized): Secondary | ICD-10-CM | POA: Diagnosis not present

## 2023-05-25 DIAGNOSIS — R1312 Dysphagia, oropharyngeal phase: Secondary | ICD-10-CM | POA: Diagnosis not present

## 2023-05-25 DIAGNOSIS — R262 Difficulty in walking, not elsewhere classified: Secondary | ICD-10-CM | POA: Diagnosis not present

## 2023-05-25 DIAGNOSIS — R2689 Other abnormalities of gait and mobility: Secondary | ICD-10-CM | POA: Diagnosis not present

## 2023-05-29 DIAGNOSIS — Z79899 Other long term (current) drug therapy: Secondary | ICD-10-CM | POA: Diagnosis not present

## 2023-05-30 DIAGNOSIS — Z79899 Other long term (current) drug therapy: Secondary | ICD-10-CM | POA: Diagnosis not present

## 2023-05-30 DIAGNOSIS — E876 Hypokalemia: Secondary | ICD-10-CM | POA: Diagnosis not present

## 2023-05-30 DIAGNOSIS — E441 Mild protein-calorie malnutrition: Secondary | ICD-10-CM | POA: Diagnosis not present

## 2023-06-05 DIAGNOSIS — N3281 Overactive bladder: Secondary | ICD-10-CM | POA: Diagnosis not present

## 2023-06-05 DIAGNOSIS — Z79899 Other long term (current) drug therapy: Secondary | ICD-10-CM | POA: Diagnosis not present

## 2023-06-23 DIAGNOSIS — R1312 Dysphagia, oropharyngeal phase: Secondary | ICD-10-CM | POA: Diagnosis not present

## 2023-06-26 DIAGNOSIS — R1312 Dysphagia, oropharyngeal phase: Secondary | ICD-10-CM | POA: Diagnosis not present

## 2023-06-27 DIAGNOSIS — R1312 Dysphagia, oropharyngeal phase: Secondary | ICD-10-CM | POA: Diagnosis not present

## 2023-06-28 DIAGNOSIS — R1312 Dysphagia, oropharyngeal phase: Secondary | ICD-10-CM | POA: Diagnosis not present

## 2023-06-29 DIAGNOSIS — R1312 Dysphagia, oropharyngeal phase: Secondary | ICD-10-CM | POA: Diagnosis not present

## 2023-06-30 DIAGNOSIS — R1312 Dysphagia, oropharyngeal phase: Secondary | ICD-10-CM | POA: Diagnosis not present

## 2023-07-03 DIAGNOSIS — R1312 Dysphagia, oropharyngeal phase: Secondary | ICD-10-CM | POA: Diagnosis not present

## 2023-07-04 DIAGNOSIS — G309 Alzheimer's disease, unspecified: Secondary | ICD-10-CM | POA: Diagnosis not present

## 2023-07-04 DIAGNOSIS — E785 Hyperlipidemia, unspecified: Secondary | ICD-10-CM | POA: Diagnosis not present

## 2023-07-04 DIAGNOSIS — E1142 Type 2 diabetes mellitus with diabetic polyneuropathy: Secondary | ICD-10-CM | POA: Diagnosis not present

## 2023-07-04 DIAGNOSIS — E039 Hypothyroidism, unspecified: Secondary | ICD-10-CM | POA: Diagnosis not present

## 2023-07-04 DIAGNOSIS — M79676 Pain in unspecified toe(s): Secondary | ICD-10-CM | POA: Diagnosis not present

## 2023-07-04 DIAGNOSIS — Z79899 Other long term (current) drug therapy: Secondary | ICD-10-CM | POA: Diagnosis not present

## 2023-07-04 DIAGNOSIS — B351 Tinea unguium: Secondary | ICD-10-CM | POA: Diagnosis not present

## 2023-07-04 DIAGNOSIS — R1312 Dysphagia, oropharyngeal phase: Secondary | ICD-10-CM | POA: Diagnosis not present

## 2023-07-05 DIAGNOSIS — R1312 Dysphagia, oropharyngeal phase: Secondary | ICD-10-CM | POA: Diagnosis not present

## 2023-07-06 DIAGNOSIS — R1312 Dysphagia, oropharyngeal phase: Secondary | ICD-10-CM | POA: Diagnosis not present

## 2023-07-07 DIAGNOSIS — R1312 Dysphagia, oropharyngeal phase: Secondary | ICD-10-CM | POA: Diagnosis not present

## 2023-07-09 DIAGNOSIS — R1312 Dysphagia, oropharyngeal phase: Secondary | ICD-10-CM | POA: Diagnosis not present

## 2023-07-10 DIAGNOSIS — R1312 Dysphagia, oropharyngeal phase: Secondary | ICD-10-CM | POA: Diagnosis not present

## 2023-07-12 DIAGNOSIS — R1312 Dysphagia, oropharyngeal phase: Secondary | ICD-10-CM | POA: Diagnosis not present

## 2023-07-13 DIAGNOSIS — R1312 Dysphagia, oropharyngeal phase: Secondary | ICD-10-CM | POA: Diagnosis not present

## 2023-07-14 DIAGNOSIS — R1312 Dysphagia, oropharyngeal phase: Secondary | ICD-10-CM | POA: Diagnosis not present

## 2023-07-17 DIAGNOSIS — R1312 Dysphagia, oropharyngeal phase: Secondary | ICD-10-CM | POA: Diagnosis not present

## 2023-07-17 DIAGNOSIS — M79604 Pain in right leg: Secondary | ICD-10-CM | POA: Diagnosis not present

## 2023-07-17 DIAGNOSIS — E039 Hypothyroidism, unspecified: Secondary | ICD-10-CM | POA: Diagnosis not present

## 2023-07-18 DIAGNOSIS — R1312 Dysphagia, oropharyngeal phase: Secondary | ICD-10-CM | POA: Diagnosis not present

## 2023-07-20 DIAGNOSIS — R1312 Dysphagia, oropharyngeal phase: Secondary | ICD-10-CM | POA: Diagnosis not present

## 2023-07-24 DIAGNOSIS — N390 Urinary tract infection, site not specified: Secondary | ICD-10-CM | POA: Diagnosis not present

## 2023-07-24 DIAGNOSIS — R300 Dysuria: Secondary | ICD-10-CM | POA: Diagnosis not present

## 2023-07-28 DIAGNOSIS — Z79899 Other long term (current) drug therapy: Secondary | ICD-10-CM | POA: Diagnosis not present

## 2023-07-28 DIAGNOSIS — N390 Urinary tract infection, site not specified: Secondary | ICD-10-CM | POA: Diagnosis not present

## 2023-08-09 DIAGNOSIS — M25532 Pain in left wrist: Secondary | ICD-10-CM | POA: Diagnosis not present

## 2023-08-09 DIAGNOSIS — M79642 Pain in left hand: Secondary | ICD-10-CM | POA: Diagnosis not present

## 2023-08-10 DIAGNOSIS — Z79899 Other long term (current) drug therapy: Secondary | ICD-10-CM | POA: Diagnosis not present

## 2023-08-10 DIAGNOSIS — R52 Pain, unspecified: Secondary | ICD-10-CM | POA: Diagnosis not present

## 2023-08-10 DIAGNOSIS — M24542 Contracture, left hand: Secondary | ICD-10-CM | POA: Diagnosis not present

## 2023-08-10 DIAGNOSIS — M19042 Primary osteoarthritis, left hand: Secondary | ICD-10-CM | POA: Diagnosis not present

## 2023-08-14 DIAGNOSIS — R278 Other lack of coordination: Secondary | ICD-10-CM | POA: Diagnosis not present

## 2023-08-15 DIAGNOSIS — R278 Other lack of coordination: Secondary | ICD-10-CM | POA: Diagnosis not present

## 2023-08-16 DIAGNOSIS — R278 Other lack of coordination: Secondary | ICD-10-CM | POA: Diagnosis not present

## 2023-08-17 DIAGNOSIS — R278 Other lack of coordination: Secondary | ICD-10-CM | POA: Diagnosis not present

## 2023-08-18 DIAGNOSIS — R278 Other lack of coordination: Secondary | ICD-10-CM | POA: Diagnosis not present

## 2023-08-21 DIAGNOSIS — R278 Other lack of coordination: Secondary | ICD-10-CM | POA: Diagnosis not present

## 2023-08-22 DIAGNOSIS — R278 Other lack of coordination: Secondary | ICD-10-CM | POA: Diagnosis not present

## 2023-08-23 DIAGNOSIS — R278 Other lack of coordination: Secondary | ICD-10-CM | POA: Diagnosis not present

## 2023-08-24 DIAGNOSIS — R278 Other lack of coordination: Secondary | ICD-10-CM | POA: Diagnosis not present

## 2023-08-25 DIAGNOSIS — R278 Other lack of coordination: Secondary | ICD-10-CM | POA: Diagnosis not present

## 2023-08-28 DIAGNOSIS — R278 Other lack of coordination: Secondary | ICD-10-CM | POA: Diagnosis not present

## 2023-08-29 DIAGNOSIS — Z79899 Other long term (current) drug therapy: Secondary | ICD-10-CM | POA: Diagnosis not present

## 2023-08-29 DIAGNOSIS — R278 Other lack of coordination: Secondary | ICD-10-CM | POA: Diagnosis not present

## 2023-08-30 DIAGNOSIS — M24542 Contracture, left hand: Secondary | ICD-10-CM | POA: Diagnosis not present

## 2023-08-30 DIAGNOSIS — E039 Hypothyroidism, unspecified: Secondary | ICD-10-CM | POA: Diagnosis not present

## 2023-08-30 DIAGNOSIS — I6350 Cerebral infarction due to unspecified occlusion or stenosis of unspecified cerebral artery: Secondary | ICD-10-CM | POA: Diagnosis not present

## 2023-08-30 DIAGNOSIS — E785 Hyperlipidemia, unspecified: Secondary | ICD-10-CM | POA: Diagnosis not present

## 2023-08-30 DIAGNOSIS — R278 Other lack of coordination: Secondary | ICD-10-CM | POA: Diagnosis not present

## 2023-08-30 DIAGNOSIS — G919 Hydrocephalus, unspecified: Secondary | ICD-10-CM | POA: Diagnosis not present

## 2023-08-30 DIAGNOSIS — N3281 Overactive bladder: Secondary | ICD-10-CM | POA: Diagnosis not present

## 2023-08-30 DIAGNOSIS — M19042 Primary osteoarthritis, left hand: Secondary | ICD-10-CM | POA: Diagnosis not present

## 2023-08-31 DIAGNOSIS — R278 Other lack of coordination: Secondary | ICD-10-CM | POA: Diagnosis not present

## 2023-09-01 DIAGNOSIS — R278 Other lack of coordination: Secondary | ICD-10-CM | POA: Diagnosis not present

## 2023-09-04 DIAGNOSIS — R278 Other lack of coordination: Secondary | ICD-10-CM | POA: Diagnosis not present

## 2023-09-05 DIAGNOSIS — R278 Other lack of coordination: Secondary | ICD-10-CM | POA: Diagnosis not present

## 2023-09-06 DIAGNOSIS — R278 Other lack of coordination: Secondary | ICD-10-CM | POA: Diagnosis not present

## 2023-09-07 DIAGNOSIS — R278 Other lack of coordination: Secondary | ICD-10-CM | POA: Diagnosis not present

## 2023-09-08 DIAGNOSIS — R278 Other lack of coordination: Secondary | ICD-10-CM | POA: Diagnosis not present

## 2023-09-11 DIAGNOSIS — Z79899 Other long term (current) drug therapy: Secondary | ICD-10-CM | POA: Diagnosis not present

## 2023-09-11 DIAGNOSIS — N390 Urinary tract infection, site not specified: Secondary | ICD-10-CM | POA: Diagnosis not present

## 2023-09-11 DIAGNOSIS — R278 Other lack of coordination: Secondary | ICD-10-CM | POA: Diagnosis not present

## 2023-09-12 DIAGNOSIS — R278 Other lack of coordination: Secondary | ICD-10-CM | POA: Diagnosis not present

## 2023-09-13 DIAGNOSIS — R278 Other lack of coordination: Secondary | ICD-10-CM | POA: Diagnosis not present

## 2023-09-14 DIAGNOSIS — R278 Other lack of coordination: Secondary | ICD-10-CM | POA: Diagnosis not present

## 2023-09-20 DIAGNOSIS — E119 Type 2 diabetes mellitus without complications: Secondary | ICD-10-CM | POA: Diagnosis not present

## 2023-09-20 DIAGNOSIS — H2513 Age-related nuclear cataract, bilateral: Secondary | ICD-10-CM | POA: Diagnosis not present

## 2023-09-20 DIAGNOSIS — H524 Presbyopia: Secondary | ICD-10-CM | POA: Diagnosis not present

## 2023-09-21 DIAGNOSIS — R310 Gross hematuria: Secondary | ICD-10-CM | POA: Diagnosis not present

## 2023-09-21 DIAGNOSIS — R339 Retention of urine, unspecified: Secondary | ICD-10-CM | POA: Diagnosis not present

## 2023-09-23 DIAGNOSIS — R339 Retention of urine, unspecified: Secondary | ICD-10-CM | POA: Diagnosis not present

## 2023-09-23 DIAGNOSIS — N139 Obstructive and reflux uropathy, unspecified: Secondary | ICD-10-CM | POA: Diagnosis not present

## 2023-09-28 DIAGNOSIS — G309 Alzheimer's disease, unspecified: Secondary | ICD-10-CM | POA: Diagnosis not present

## 2023-09-28 DIAGNOSIS — R1312 Dysphagia, oropharyngeal phase: Secondary | ICD-10-CM | POA: Diagnosis not present

## 2023-09-29 DIAGNOSIS — G309 Alzheimer's disease, unspecified: Secondary | ICD-10-CM | POA: Diagnosis not present

## 2023-09-29 DIAGNOSIS — R1312 Dysphagia, oropharyngeal phase: Secondary | ICD-10-CM | POA: Diagnosis not present

## 2023-10-05 DIAGNOSIS — G309 Alzheimer's disease, unspecified: Secondary | ICD-10-CM | POA: Diagnosis not present

## 2023-10-05 DIAGNOSIS — R1312 Dysphagia, oropharyngeal phase: Secondary | ICD-10-CM | POA: Diagnosis not present

## 2023-10-06 DIAGNOSIS — G309 Alzheimer's disease, unspecified: Secondary | ICD-10-CM | POA: Diagnosis not present

## 2023-10-06 DIAGNOSIS — R1312 Dysphagia, oropharyngeal phase: Secondary | ICD-10-CM | POA: Diagnosis not present

## 2023-10-08 DIAGNOSIS — G309 Alzheimer's disease, unspecified: Secondary | ICD-10-CM | POA: Diagnosis not present

## 2023-10-08 DIAGNOSIS — R1312 Dysphagia, oropharyngeal phase: Secondary | ICD-10-CM | POA: Diagnosis not present

## 2023-10-12 DIAGNOSIS — G309 Alzheimer's disease, unspecified: Secondary | ICD-10-CM | POA: Diagnosis not present

## 2023-10-12 DIAGNOSIS — R1312 Dysphagia, oropharyngeal phase: Secondary | ICD-10-CM | POA: Diagnosis not present

## 2023-10-13 DIAGNOSIS — R1312 Dysphagia, oropharyngeal phase: Secondary | ICD-10-CM | POA: Diagnosis not present

## 2023-10-13 DIAGNOSIS — G309 Alzheimer's disease, unspecified: Secondary | ICD-10-CM | POA: Diagnosis not present

## 2023-10-17 DIAGNOSIS — E039 Hypothyroidism, unspecified: Secondary | ICD-10-CM | POA: Diagnosis not present

## 2023-10-17 DIAGNOSIS — R309 Painful micturition, unspecified: Secondary | ICD-10-CM | POA: Diagnosis not present

## 2023-10-17 DIAGNOSIS — Z79899 Other long term (current) drug therapy: Secondary | ICD-10-CM | POA: Diagnosis not present

## 2023-10-17 DIAGNOSIS — E785 Hyperlipidemia, unspecified: Secondary | ICD-10-CM | POA: Diagnosis not present

## 2023-10-19 DIAGNOSIS — G309 Alzheimer's disease, unspecified: Secondary | ICD-10-CM | POA: Diagnosis not present

## 2023-10-19 DIAGNOSIS — R1312 Dysphagia, oropharyngeal phase: Secondary | ICD-10-CM | POA: Diagnosis not present

## 2023-10-20 ENCOUNTER — Emergency Department (HOSPITAL_COMMUNITY)

## 2023-10-20 ENCOUNTER — Other Ambulatory Visit: Payer: Self-pay

## 2023-10-20 ENCOUNTER — Inpatient Hospital Stay (HOSPITAL_COMMUNITY)
Admission: EM | Admit: 2023-10-20 | Discharge: 2023-10-24 | DRG: 689 | Disposition: A | Discharge status: Skilled Nursing Facility | Source: Skilled Nursing Facility | Attending: Internal Medicine | Admitting: Internal Medicine

## 2023-10-20 ENCOUNTER — Encounter (HOSPITAL_COMMUNITY): Payer: Self-pay | Admitting: Emergency Medicine

## 2023-10-20 DIAGNOSIS — E559 Vitamin D deficiency, unspecified: Secondary | ICD-10-CM | POA: Diagnosis present

## 2023-10-20 DIAGNOSIS — F0390 Unspecified dementia without behavioral disturbance: Secondary | ICD-10-CM | POA: Diagnosis not present

## 2023-10-20 DIAGNOSIS — G40909 Epilepsy, unspecified, not intractable, without status epilepticus: Secondary | ICD-10-CM | POA: Diagnosis present

## 2023-10-20 DIAGNOSIS — Y846 Urinary catheterization as the cause of abnormal reaction of the patient, or of later complication, without mention of misadventure at the time of the procedure: Secondary | ICD-10-CM | POA: Diagnosis not present

## 2023-10-20 DIAGNOSIS — T8383XA Hemorrhage of genitourinary prosthetic devices, implants and grafts, initial encounter: Secondary | ICD-10-CM | POA: Diagnosis not present

## 2023-10-20 DIAGNOSIS — F0280 Dementia in other diseases classified elsewhere without behavioral disturbance: Secondary | ICD-10-CM | POA: Diagnosis present

## 2023-10-20 DIAGNOSIS — E441 Mild protein-calorie malnutrition: Secondary | ICD-10-CM | POA: Diagnosis not present

## 2023-10-20 DIAGNOSIS — D62 Acute posthemorrhagic anemia: Secondary | ICD-10-CM | POA: Diagnosis present

## 2023-10-20 DIAGNOSIS — R339 Retention of urine, unspecified: Principal | ICD-10-CM

## 2023-10-20 DIAGNOSIS — K449 Diaphragmatic hernia without obstruction or gangrene: Secondary | ICD-10-CM | POA: Diagnosis not present

## 2023-10-20 DIAGNOSIS — Z8249 Family history of ischemic heart disease and other diseases of the circulatory system: Secondary | ICD-10-CM

## 2023-10-20 DIAGNOSIS — R1312 Dysphagia, oropharyngeal phase: Secondary | ICD-10-CM | POA: Diagnosis not present

## 2023-10-20 DIAGNOSIS — G309 Alzheimer's disease, unspecified: Secondary | ICD-10-CM | POA: Diagnosis present

## 2023-10-20 DIAGNOSIS — Z9049 Acquired absence of other specified parts of digestive tract: Secondary | ICD-10-CM

## 2023-10-20 DIAGNOSIS — B9620 Unspecified Escherichia coli [E. coli] as the cause of diseases classified elsewhere: Secondary | ICD-10-CM | POA: Diagnosis present

## 2023-10-20 DIAGNOSIS — R578 Other shock: Secondary | ICD-10-CM | POA: Diagnosis present

## 2023-10-20 DIAGNOSIS — Z79899 Other long term (current) drug therapy: Secondary | ICD-10-CM

## 2023-10-20 DIAGNOSIS — E8809 Other disorders of plasma-protein metabolism, not elsewhere classified: Secondary | ICD-10-CM | POA: Diagnosis not present

## 2023-10-20 DIAGNOSIS — N1330 Unspecified hydronephrosis: Secondary | ICD-10-CM | POA: Diagnosis not present

## 2023-10-20 DIAGNOSIS — Z66 Do not resuscitate: Secondary | ICD-10-CM | POA: Diagnosis present

## 2023-10-20 DIAGNOSIS — N134 Hydroureter: Secondary | ICD-10-CM | POA: Diagnosis not present

## 2023-10-20 DIAGNOSIS — Z820 Family history of epilepsy and other diseases of the nervous system: Secondary | ICD-10-CM

## 2023-10-20 DIAGNOSIS — I10 Essential (primary) hypertension: Secondary | ICD-10-CM | POA: Diagnosis present

## 2023-10-20 DIAGNOSIS — R58 Hemorrhage, not elsewhere classified: Secondary | ICD-10-CM | POA: Diagnosis not present

## 2023-10-20 DIAGNOSIS — Z823 Family history of stroke: Secondary | ICD-10-CM

## 2023-10-20 DIAGNOSIS — M24542 Contracture, left hand: Secondary | ICD-10-CM | POA: Diagnosis not present

## 2023-10-20 DIAGNOSIS — R9431 Abnormal electrocardiogram [ECG] [EKG]: Secondary | ICD-10-CM | POA: Diagnosis not present

## 2023-10-20 DIAGNOSIS — R531 Weakness: Secondary | ICD-10-CM | POA: Diagnosis not present

## 2023-10-20 DIAGNOSIS — R34 Anuria and oliguria: Secondary | ICD-10-CM | POA: Diagnosis not present

## 2023-10-20 DIAGNOSIS — Z801 Family history of malignant neoplasm of trachea, bronchus and lung: Secondary | ICD-10-CM

## 2023-10-20 DIAGNOSIS — F0283 Dementia in other diseases classified elsewhere, unspecified severity, with mood disturbance: Secondary | ICD-10-CM | POA: Diagnosis present

## 2023-10-20 DIAGNOSIS — E876 Hypokalemia: Secondary | ICD-10-CM | POA: Diagnosis not present

## 2023-10-20 DIAGNOSIS — N3281 Overactive bladder: Secondary | ICD-10-CM | POA: Diagnosis present

## 2023-10-20 DIAGNOSIS — R627 Adult failure to thrive: Secondary | ICD-10-CM | POA: Diagnosis not present

## 2023-10-20 DIAGNOSIS — M19042 Primary osteoarthritis, left hand: Secondary | ICD-10-CM | POA: Diagnosis not present

## 2023-10-20 DIAGNOSIS — Q039 Congenital hydrocephalus, unspecified: Secondary | ICD-10-CM

## 2023-10-20 DIAGNOSIS — E785 Hyperlipidemia, unspecified: Secondary | ICD-10-CM | POA: Diagnosis present

## 2023-10-20 DIAGNOSIS — H2513 Age-related nuclear cataract, bilateral: Secondary | ICD-10-CM | POA: Diagnosis not present

## 2023-10-20 DIAGNOSIS — R319 Hematuria, unspecified: Secondary | ICD-10-CM | POA: Diagnosis present

## 2023-10-20 DIAGNOSIS — E039 Hypothyroidism, unspecified: Secondary | ICD-10-CM | POA: Diagnosis present

## 2023-10-20 DIAGNOSIS — K6289 Other specified diseases of anus and rectum: Secondary | ICD-10-CM | POA: Diagnosis not present

## 2023-10-20 DIAGNOSIS — E119 Type 2 diabetes mellitus without complications: Secondary | ICD-10-CM | POA: Diagnosis present

## 2023-10-20 DIAGNOSIS — Z1612 Extended spectrum beta lactamase (ESBL) resistance: Secondary | ICD-10-CM | POA: Diagnosis not present

## 2023-10-20 DIAGNOSIS — Z743 Need for continuous supervision: Secondary | ICD-10-CM | POA: Diagnosis not present

## 2023-10-20 DIAGNOSIS — N136 Pyonephrosis: Principal | ICD-10-CM | POA: Diagnosis present

## 2023-10-20 DIAGNOSIS — Z7401 Bed confinement status: Secondary | ICD-10-CM | POA: Diagnosis not present

## 2023-10-20 DIAGNOSIS — Z79890 Hormone replacement therapy (postmenopausal): Secondary | ICD-10-CM

## 2023-10-20 DIAGNOSIS — R5381 Other malaise: Secondary | ICD-10-CM | POA: Diagnosis present

## 2023-10-20 DIAGNOSIS — Z8673 Personal history of transient ischemic attack (TIA), and cerebral infarction without residual deficits: Secondary | ICD-10-CM

## 2023-10-20 DIAGNOSIS — K5710 Diverticulosis of small intestine without perforation or abscess without bleeding: Secondary | ICD-10-CM | POA: Diagnosis not present

## 2023-10-20 DIAGNOSIS — R310 Gross hematuria: Secondary | ICD-10-CM | POA: Diagnosis present

## 2023-10-20 DIAGNOSIS — H524 Presbyopia: Secondary | ICD-10-CM | POA: Diagnosis not present

## 2023-10-20 DIAGNOSIS — I959 Hypotension, unspecified: Secondary | ICD-10-CM | POA: Diagnosis present

## 2023-10-20 DIAGNOSIS — E538 Deficiency of other specified B group vitamins: Secondary | ICD-10-CM | POA: Diagnosis not present

## 2023-10-20 DIAGNOSIS — I700 Atherosclerosis of aorta: Secondary | ICD-10-CM | POA: Diagnosis not present

## 2023-10-20 LAB — CBC WITH DIFFERENTIAL/PLATELET
Abs Immature Granulocytes: 0.03 10*3/uL (ref 0.00–0.07)
Basophils Absolute: 0 10*3/uL (ref 0.0–0.1)
Basophils Relative: 0 %
Eosinophils Absolute: 0.1 10*3/uL (ref 0.0–0.5)
Eosinophils Relative: 2 %
HCT: 26.4 % — ABNORMAL LOW (ref 36.0–46.0)
Hemoglobin: 8.4 g/dL — ABNORMAL LOW (ref 12.0–15.0)
Immature Granulocytes: 1 %
Lymphocytes Relative: 20 %
Lymphs Abs: 1.3 10*3/uL (ref 0.7–4.0)
MCH: 27.6 pg (ref 26.0–34.0)
MCHC: 31.8 g/dL (ref 30.0–36.0)
MCV: 86.8 fL (ref 80.0–100.0)
Monocytes Absolute: 0.7 10*3/uL (ref 0.1–1.0)
Monocytes Relative: 10 %
Neutro Abs: 4.3 10*3/uL (ref 1.7–7.7)
Neutrophils Relative %: 67 %
Platelets: 252 10*3/uL (ref 150–400)
RBC: 3.04 MIL/uL — ABNORMAL LOW (ref 3.87–5.11)
RDW: 14.5 % (ref 11.5–15.5)
WBC: 6.4 10*3/uL (ref 4.0–10.5)
nRBC: 0 % (ref 0.0–0.2)

## 2023-10-20 LAB — CBC
HCT: 27.3 % — ABNORMAL LOW (ref 36.0–46.0)
HCT: 27.7 % — ABNORMAL LOW (ref 36.0–46.0)
Hemoglobin: 8.5 g/dL — ABNORMAL LOW (ref 12.0–15.0)
Hemoglobin: 8.8 g/dL — ABNORMAL LOW (ref 12.0–15.0)
MCH: 27.3 pg (ref 26.0–34.0)
MCH: 27.7 pg (ref 26.0–34.0)
MCHC: 31.1 g/dL (ref 30.0–36.0)
MCHC: 31.8 g/dL (ref 30.0–36.0)
MCV: 87.8 fL (ref 80.0–100.0)
MCV: 87.1 fL (ref 80.0–100.0)
Platelets: 255 10*3/uL (ref 150–400)
Platelets: 309 10*3/uL (ref 150–400)
RBC: 3.11 MIL/uL — ABNORMAL LOW (ref 3.87–5.11)
RBC: 3.18 MIL/uL — ABNORMAL LOW (ref 3.87–5.11)
RDW: 14.4 % (ref 11.5–15.5)
RDW: 14.3 % (ref 11.5–15.5)
WBC: 5.9 10*3/uL (ref 4.0–10.5)
WBC: 7.3 10*3/uL (ref 4.0–10.5)
nRBC: 0 % (ref 0.0–0.2)
nRBC: 0 % (ref 0.0–0.2)

## 2023-10-20 LAB — URINALYSIS, W/ REFLEX TO CULTURE (INFECTION SUSPECTED)
Bilirubin Urine: NEGATIVE
Glucose, UA: NEGATIVE mg/dL
Ketones, ur: NEGATIVE mg/dL
Nitrite: NEGATIVE
Protein, ur: 100 mg/dL — AB
RBC / HPF: >50 RBC/hpf (ref 0–5)
Specific Gravity, Urine: 1.013 (ref 1.005–1.030)
WBC, UA: >50 WBC/hpf (ref 0–5)
pH: 7 (ref 5.0–8.0)

## 2023-10-20 LAB — PROTIME-INR
INR: 1.1 (ref 0.8–1.2)
Prothrombin Time: 14.5 s (ref 11.4–15.2)

## 2023-10-20 LAB — BASIC METABOLIC PANEL WITH GFR
Anion gap: 7 (ref 5–15)
BUN: 27 mg/dL — ABNORMAL HIGH (ref 8–23)
CO2: 27 mmol/L (ref 22–32)
Calcium: 8.6 mg/dL — ABNORMAL LOW (ref 8.9–10.3)
Chloride: 102 mmol/L (ref 98–111)
Creatinine, Ser: 0.72 mg/dL (ref 0.44–1.00)
GFR, Estimated: >60 mL/min (ref >60)
Glucose, Bld: 127 mg/dL — ABNORMAL HIGH (ref 70–99)
Potassium: 4.3 mmol/L (ref 3.5–5.1)
Sodium: 136 mmol/L (ref 135–145)

## 2023-10-20 LAB — PREPARE RBC (CROSSMATCH)

## 2023-10-20 MED ORDER — METRONIDAZOLE 500 MG/100ML IV SOLN
500.0000 mg | Freq: Two times a day (BID) | INTRAVENOUS | Status: DC
  Administered 2023-10-20 – 2023-10-22 (×4): 500 mg via INTRAVENOUS
  Filled 2023-10-20 (×4): qty 100

## 2023-10-20 MED ORDER — ACETAMINOPHEN 650 MG RE SUPP: 650.0000 mg | Freq: Four times a day (QID) | RECTAL | Status: DC | PRN

## 2023-10-20 MED ORDER — MAGNESIUM OXIDE -MG SUPPLEMENT 400 (240 MG) MG PO TABS
400.0000 mg | ORAL_TABLET | Freq: Every day | ORAL | Status: DC
  Administered 2023-10-21 – 2023-10-24 (×4): 400 mg via ORAL
  Filled 2023-10-20 (×4): qty 1

## 2023-10-20 MED ORDER — SODIUM CHLORIDE 0.9 % IR SOLN
3000.0000 mL | Status: DC
  Administered 2023-10-20: 3000 mL

## 2023-10-20 MED ORDER — SODIUM CHLORIDE 0.9 % IV SOLN
1.0000 g | Freq: Once | INTRAVENOUS | Status: AC
  Administered 2023-10-20: 1 g via INTRAVENOUS
  Filled 2023-10-20: qty 10

## 2023-10-20 MED ORDER — POTASSIUM CHLORIDE CRYS ER 20 MEQ PO TBCR
20.0000 meq | EXTENDED_RELEASE_TABLET | Freq: Every day | ORAL | Status: DC
  Administered 2023-10-20 – 2023-10-24 (×5): 20 meq via ORAL
  Filled 2023-10-20 (×5): qty 1

## 2023-10-20 MED ORDER — MELATONIN 3 MG PO TABS
3.0000 mg | ORAL_TABLET | Freq: Every day | ORAL | Status: DC
  Administered 2023-10-20 – 2023-10-23 (×4): 3 mg via ORAL
  Filled 2023-10-20 (×4): qty 1

## 2023-10-20 MED ORDER — MIRABEGRON ER 25 MG PO TB24
50.0000 mg | ORAL_TABLET | Freq: Every day | ORAL | Status: DC
  Administered 2023-10-20 – 2023-10-23 (×4): 50 mg via ORAL
  Filled 2023-10-20 (×4): qty 2

## 2023-10-20 MED ORDER — DONEPEZIL HCL 5 MG PO TABS
10.0000 mg | ORAL_TABLET | Freq: Every day | ORAL | Status: DC
  Administered 2023-10-20 – 2023-10-23 (×4): 10 mg via ORAL
  Filled 2023-10-20 (×4): qty 2

## 2023-10-20 MED ORDER — SODIUM CHLORIDE 0.9 % IV BOLUS
1000.0000 mL | Freq: Once | INTRAVENOUS | Status: AC
  Administered 2023-10-20: 1000 mL via INTRAVENOUS

## 2023-10-20 MED ORDER — ACETAMINOPHEN 325 MG PO TABS: 650.0000 mg | ORAL_TABLET | Freq: Four times a day (QID) | ORAL | Status: DC | PRN

## 2023-10-20 MED ORDER — SODIUM CHLORIDE 0.9% IV SOLUTION: Freq: Once | INTRAVENOUS | Status: DC

## 2023-10-20 MED ORDER — LEVOTHYROXINE SODIUM 50 MCG PO TABS
50.0000 ug | ORAL_TABLET | Freq: Every day | ORAL | Status: DC
  Administered 2023-10-21 – 2023-10-24 (×4): 50 ug via ORAL
  Filled 2023-10-20 (×4): qty 1

## 2023-10-20 MED ORDER — FESOTERODINE FUMARATE ER 8 MG PO TB24
8.0000 mg | ORAL_TABLET | Freq: Every day | ORAL | Status: DC
  Administered 2023-10-21 – 2023-10-24 (×4): 8 mg via ORAL
  Filled 2023-10-20 (×6): qty 1

## 2023-10-20 MED ORDER — VITAMIN D 25 MCG (1000 UNIT) PO TABS
2000.0000 [IU] | ORAL_TABLET | Freq: Every day | ORAL | Status: DC
  Administered 2023-10-21 – 2023-10-24 (×4): 2000 [IU] via ORAL
  Filled 2023-10-20 (×4): qty 2

## 2023-10-20 MED ORDER — MIRTAZAPINE 15 MG PO TABS
15.0000 mg | ORAL_TABLET | Freq: Every day | ORAL | Status: DC
  Administered 2023-10-20 – 2023-10-23 (×4): 15 mg via ORAL
  Filled 2023-10-20 (×4): qty 1

## 2023-10-20 MED ORDER — CALCIUM CARBONATE 1250 (500 CA) MG PO TABS
1.0000 | ORAL_TABLET | Freq: Every day | ORAL | Status: DC
  Administered 2023-10-21 – 2023-10-24 (×4): 1250 mg via ORAL
  Filled 2023-10-20 (×4): qty 1

## 2023-10-20 MED ORDER — SERTRALINE HCL 50 MG PO TABS
50.0000 mg | ORAL_TABLET | Freq: Every day | ORAL | Status: DC
  Administered 2023-10-21 – 2023-10-24 (×4): 50 mg via ORAL
  Filled 2023-10-20 (×4): qty 1

## 2023-10-20 MED ORDER — ATORVASTATIN CALCIUM 40 MG PO TABS
40.0000 mg | ORAL_TABLET | Freq: Every day | ORAL | Status: DC
  Administered 2023-10-21 – 2023-10-24 (×4): 40 mg via ORAL
  Filled 2023-10-20 (×4): qty 1

## 2023-10-20 MED ORDER — LEVETIRACETAM 500 MG PO TABS
750.0000 mg | ORAL_TABLET | Freq: Two times a day (BID) | ORAL | Status: DC
  Administered 2023-10-20 – 2023-10-24 (×8): 750 mg via ORAL
  Filled 2023-10-20 (×8): qty 1

## 2023-10-20 NOTE — H&P (Addendum)
 [facility] H&P   Patient Demographics:    Leah Payne, is a 78 y.o. female  MRN: [id number]   DOB - [birth year]  Admit Date - 10/20/2023  Outpatient Primary MD for the patient is [place], Christella Hartigan  Referring MD/NP/PA: Dr Charm Barges  Patient coming from: [facility] nursing [facility]  Chief Complaint  Patient presents with   Hematuria      HPI:    Leah Payne  is a 78 y.o. female,  with PMH of congenital hydrocephalus, seizure disorder, hematuria, overactive bladder, dementia, HTN, HLD, WPW syndrome , patient with known history of hematuria,  -He had hospitalization last September at [facility] for hematuria for which he underwent CT urogram which was suspicious for neoplastic disease and was treated with CBI and discharged on 9/13 (cystoscopy during that admission found bladder mass, which was biopsy, biopsy was significant for acute on chronic inflammation with no malignancy) discharged with Foley, she was readmitted again at [facility] for gross hematuria and hemorrhagic shock, resolved after blood transfusion, stabilized after blood transfusions and holding her aspirin and Plavix. -And with advanced dementia, congenital hydrocephalus unable to provide unreliable history, it was obtained from ED staff and previous records -Patient was sent from her facility for what thought to be a vaginal bleeding, was hypotensive with EMS, she is awake herself, denies any complaints, but cannot provide any good history, open evaluation in ED, it was noted to have gross hematuria not vaginal bleeding, CT abdomen pelvis was obtained which did show evidence of urinary retention with bilateral hydronephrosis, Foley catheter was inserted with total of 2350 cc output gross hematuria, most recent hemoglobin was 13 last September, it is 8.4 this admission, [facility] hospitalist consulted to  admit.   Review of systems:     A full 10 point Review of Systems was done, except as stated above, all other Review of Systems were negative.   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) - saw Dr Gerlene Fee - no benefit from shunt placement    Hyperlipidemia 08/04/2013   Personal history of transient cerebral ischemia    Preaxial polydactyly of right hand congenital   Seizure disorder (HCC) 08/04/2013   WPW (Wolff-Parkinson-White syndrome)    has seen Dr Donnie Aho      Past Surgical History:  Procedure Laterality  Date   CHOLECYSTECTOMY  19      Social History:     Social History   Tobacco Use   Smoking status: Never   Smokeless tobacco: Never  Substance Use Topics   Alcohol use: No    Alcohol/week: 0.0 standard drinks of alcohol        Family History :     Family History  Problem Relation Age of Onset   Stroke Mother 42   Pneumonia Mother    CAD Father 54   Alzheimer's disease Father    Pneumonia Father    Cancer Maternal Aunt        lung (non-smoker)   Diabetes Neg Hx     Home Medications:   Prior to Admission medications   Medication Sig Start Date End Date Taking? Authorizing Provider  acetaminophen (TYLENOL) 650 MG CR tablet Take 650 mg by mouth every 8 (eight) hours as needed for pain.   Yes [provider]  Amino Acids-Protein Hydrolys (PRO-STAT) LIQD Take 30 mLs by mouth in the morning and at bedtime.   Yes [provider]  atorvastatin (LIPITOR) 40 MG tablet TAKE (1) TABLET BY MOUTH EVERY EVENING. 06/21/22  Yes Luking, Jonna Coup, MD  calcium carbonate (OSCAL) 1500 (600 Ca) MG TABS tablet Take 600 mg of elemental calcium by mouth daily with breakfast.   Yes [provider]  Cholecalciferol (VITAMIN D) 50 MCG (2000 UT) CAPS Take 1 capsule (2,000 Units total) by mouth daily. 02/02/19  Yes Eustaquio Boyden, MD  donepezil (ARICEPT) 10 MG tablet TAKE (1) TABLET BY MOUTH EVERY MORNING. Patient taking differently: Take 10 mg by mouth at bedtime. 06/21/22  Yes Babs Sciara, MD  lactose free nutrition (BOOST) LIQD Take 240 mLs by mouth 3 (three) times daily between meals.   Yes [provider]  levETIRAcetam (KEPPRA) 750 MG tablet Take 1 tablet (750 mg total) by mouth 2 (two) times daily. 02/20/23  Yes Azucena Fallen, MD  levothyroxine (SYNTHROID) 50 MCG tablet Take 50 mcg by mouth daily. 09/18/23  Yes [provider]  loperamide (IMODIUM A-D) 2 MG tablet Take 2-4 mg by mouth as needed for diarrhea or loose stools. Do not exceed 4 tablets in 24 hour period. If diarrhea persists longer than 24 hours contact MD   Yes [provider]  magnesium oxide (MAG-OX) 400 (240 Mg) MG tablet Take 400 mg by mouth daily.   Yes [provider]  melatonin 5 MG TABS Take 5 mg by mouth at bedtime.   Yes [provider]  Menthol-Zinc Oxide (CALMOSEPTINE) 0.44-20.6 % OINT Apply 1 Application topically in the morning, at noon, and at bedtime. Apply to buttocks and groin + every 4 hours prn for moisture associated skin damage   Yes [provider]  mirtazapine (REMERON) 15 MG tablet Take 15 mg by mouth at bedtime.   Yes [provider]  [name] 50 MG TB24 tablet TAKE (1) TABLET BY MOUTH AT BEDTIME. Patient taking differently: Take 50 mg by mouth at bedtime. 06/21/22  Yes Luking, Jonna Coup, MD  potassium chloride SA (KLOR-CON M) 20 MEQ tablet Take 20 mEq by  mouth daily. 09/18/23  Yes [provider]  sertraline (ZOLOFT) 50 MG tablet Take 50 mg by mouth daily. 08/30/23  Yes [provider]  [name] 8 MG TB24 tablet TAKE (1) TABLET BY MOUTH ONCE DAILY. 06/21/22  Yes Babs Sciara, MD     Allergies:    No Known Allergies  Physical Exam:   Vitals  Blood pressure 112/65, pulse 94, temperature 97.8 F (36.6 C), temperature source Oral, resp. rate (!) 21, height 5\' 2"  (1.575 m), weight 49.9 kg, SpO2 98%.   1. General Well, chronic ill-appearing female, laying in bed, no apparent distress  2.  Impaired judgment and insight, awake, oriented x 2  3. No F.N deficits, ALL C.Nerves Intact, strength grossly intact.  Strength 5/5 all 4 extremities, Sensation intact all 4 extremities, Plantars down going.  4. Ears and Eyes appear Normal, Conjunctivae clear, PERRLA. Moist Oral Mucosa.  5. Supple Neck, No JVD, No cervical lymphadenopathy appriciated, No Carotid Bruits.  6. Symmetrical Chest wall movement, Good air movement bilaterally, CTAB.  7. RRR, No Gallops, Rubs or Murmurs, No Parasternal Heave.  8. Positive Bowel Sounds, Abdomen Soft, No tenderness, No organomegaly appriciated,No rebound -guarding or rigidity.  9.  No Cyanosis, Normal Skin Turgor, No Skin Rash or Bruise.  10. Good muscle tone,  joints appear normal , no effusions, Normal ROM.    Data Review:    CBC Recent Labs  Lab 10/20/23 1159  WBC 6.4  HGB 8.4*  HCT 26.4*  PLT 252  MCV 86.8  MCH 27.6  MCHC 31.8  RDW 14.5  LYMPHSABS 1.3  MONOABS 0.7  EOSABS 0.1  BASOSABS 0.0   ------------------------------------------------------------------------------------------------------------------  Chemistries  Recent Labs  Lab 10/20/23 1159  NA 136  K 4.3  CL 102  CO2 27  GLUCOSE 127*  BUN 27*  CREATININE 0.72  CALCIUM 8.6*    ------------------------------------------------------------------------------------------------------------------ estimated creatinine clearance is 45.7 mL/min (by C-G formula based on SCr of 0.72 mg/dL). ------------------------------------------------------------------------------------------------------------------ No results for input(s): "TSH", "T4TOTAL", "T3FREE", "THYROIDAB" in the last 72 hours.  Invalid input(s): "FREET3"  Coagulation profile Recent Labs  Lab 10/20/23 1159  INR 1.1   ------------------------------------------------------------------------------------------------------------------- No results for input(s): "DDIMER" in the last 72 hours. -------------------------------------------------------------------------------------------------------------------  Cardiac Enzymes No results for input(s): "CKMB", "TROPONINI", "MYOGLOBIN" in the last 168 hours.  Invalid input(s): "CK" ------------------------------------------------------------------------------------------------------------------    Component Value Date/Time   BNP 266.0 (H) 08/29/2014 0955     ---------------------------------------------------------------------------------------------------------------  Urinalysis    Component Value Date/Time   COLORURINE AMBER (A) 10/20/2023 1137   APPEARANCEUR CLOUDY (A) 10/20/2023 1137   LABSPEC 1.013 10/20/2023 1137   PHURINE 7.0 10/20/2023 1137   GLUCOSEU NEGATIVE 10/20/2023 1137   HGBUR MODERATE (A) 10/20/2023 1137   BILIRUBINUR NEGATIVE 10/20/2023 1137   BILIRUBINUR neg 03/07/2018 1020   KETONESUR NEGATIVE 10/20/2023 1137   PROTEINUR 100 (A) 10/20/2023 1137   UROBILINOGEN 0.2 03/07/2018 1020   UROBILINOGEN 1.0 08/29/2014 1206   NITRITE NEGATIVE 10/20/2023 1137   LEUKOCYTESUR MODERATE (A) 10/20/2023 1137    ----------------------------------------------------------------------------------------------------------------   Imaging Results:     CT Renal Stone Study Result Date: 10/20/2023 CLINICAL DATA:  gross hematuria. EXAM: CT ABDOMEN AND PELVIS WITHOUT CONTRAST TECHNIQUE: Multidetector CT imaging of the abdomen and pelvis was performed following the standard protocol without IV contrast. RADIATION DOSE REDUCTION: This exam was performed according to the departmental dose-optimization program which includes automated exposure control, adjustment of the mA and/or kV according to patient size and/or use of iterative reconstruction technique. COMPARISON:  CT scan abdomen and pelvis from 05/17/2019. FINDINGS: Lower chest: There are atelectatic changes at the lung bases. No dense consolidation or lung mass. No pleural effusion. The heart is normal in size. No pericardial effusion. Hepatobiliary: The liver is normal in size. Non-cirrhotic configuration. No suspicious mass. No intrahepatic or extrahepatic bile duct dilation. Gallbladder  is surgically absent. Pancreas: Unremarkable. No pancreatic ductal dilatation or surrounding inflammatory changes. Spleen: Within normal limits. No focal lesion. Adrenals/Urinary Tract: Adrenal glands are unremarkable. No suspicious renal mass within the limitations of this unenhanced exam. Bilateral extrarenal pelves noted. There is bilateral mild-to-moderate hydronephrosis and hydroureter. No nephroureterolithiasis on either side. Markedly distended urinary bladder. No focal mass or bladder calculi. Stomach/Bowel: There is a small-to-moderate sliding hiatal hernia. There is a small diverticulum arising from the third part of duodenum no disproportionate dilation of the small or large bowel loops. The appendix was not visualized; however there is no acute inflammatory process in the right lower quadrant. The rectum is dilated measuring up to 9 cm in diameter and exhibit mild circumferential wall thickening. There is mild perirectal fat stranding. In appropriate clinical settings findings favor proctitis. Vascular/Lymphatic:  No ascites or pneumoperitoneum. No abdominal or pelvic lymphadenopathy, by size criteria. No aneurysmal dilation of the major abdominal arteries. There are moderate peripheral atherosclerotic vascular calcifications of the aorta and its major branches. Reproductive: The uterus is unremarkable. No large adnexal mass. Other: There is a tiny fat containing umbilical hernia. Mild anasarca. The soft tissues and abdominal wall are otherwise unremarkable. Musculoskeletal: No suspicious osseous lesions. There are mild - moderate multilevel degenerative changes in the visualized spine. IMPRESSION: 1. Markedly distended urinary bladder with resultant mild-to-moderate bilateral hydronephrosis and hydroureter. No nephroureterolithiasis on either side. 2. Dilated rectum with mild circumferential wall thickening and perirectal fat stranding. In appropriate clinical settings findings favor proctitis. 3. Multiple other nonacute observations, as described above. Aortic Atherosclerosis (ICD10-I70.0). Electronically Signed   By: Jules Schick M.D.   On: 10/20/2023 14:56    EKG:  Vent. rate 85 BPM PR interval 131 ms QRS duration 90 ms QT/QTcB 381/453 ms P-R-T axes 70 1 16 Sinus rhythm Inferior infarct, old Anterolateral infarct, age indeterminate rate slower than prior 9/24   Assessment & Plan:    Principal Problem:   Hematuria Active Problems:   Seizure disorder (HCC)   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HCC)   Essential hypertension   Debility   Alzheimer disease (HCC)   Major neurocognitive disorder (HCC)   Hemorrhagic shock (HCC)   Acute blood loss anemia   Blood loss anemia/hemorrhage shock Gross hematuria Bilateral hydronephrosis/retention -Patient presents with hypotension, imaging significant for distended bladder and lateral hydronephrosis, but no kidney stones, this is secondary to urinary retention, likely from close hematuria and possible clots. -Foley catheter inserted in ED, so far 2350 cc output  with gross hematuria -Cussed with urology, Dr. Ronne Binning, he will evaluate today, for now recommendation for three-way Foley catheter and bladder irrigation. -Patient hypotensive, but this responded to 2 L fluid bolus, hemoglobin dropped from 13-8.4, secondary to acute blood loss anemia, she has responded to 2 L fluid bolus, I do anticipate further drop of her hemoglobin with aggressive IV hydration, will units 2 units ready for transfusion, will recheck CBC stat and type and screen. -Unable to reach any family members regarding blood transfusion, discussed with ED physician, will provide 2 two-physician consent for emergency blood transfusion -Follow urine cultures -Continue with IV Rocephin  Proctitis -As evident on imaging, unclear if this is related to close proximity to distended urinary bladder, for now we will keep empirically on IV Rocephin and azithromycin   History of congenital hydrocephalus/epilepsy:  -Stable -Continue with Keppra  Alzheimer's dementia without behavioral disturbance:  Mood disorder -Continue with Aricept, Remeron and Zoloft   History of CVA -Used to be on aspirin and Plavix,  both has been discontinued apparently in the setting of her hemorrhagic shock from hematuria.   Hypothyroidism -Continue with home Synthroid   Vit D deficiency -Continue with calcium and D supplements   Hyperlipidemia: -Continue statins.   Mood disorder: -Continue home meds.     DVT Prophylaxis  SCDs   AM Labs Ordered, also please review Full Orders  Family Communication: Admission, patients condition and plan of care including tests being ordered have been discussed with the patient ( unable to reach any family member) who indicate understanding and agree with the plan and Code Status.  Code Status DNR as per previous chart, unable to reach family on records, best friend Raymondo Band or husband unavailable phone numbers, during previous admissionshe  was DNR, and there is  MOST form on ACP documents which indicates no CPR as well.  Likely DC to  back to SNF  Consults called: urology    Admission status: inpatient    Time spent in minutes : 70 minutes   Huey Bienenstock M.D on 10/20/2023 at 3:53 PM

## 2023-10-20 NOTE — ED Provider Notes (Signed)
  EMERGENCY DEPARTMENT AT [facility] Provider Note   CSN: [id number] Arrival date & time: 10/20/23  1051     History  Chief Complaint  Patient presents with   Vaginal Bleeding    Leah Payne is a 78 y.o. female.  Level 5 caveat secondary to dementia.  Patient was brought in by ambulance from her facility [facility] for possible vaginal bleeding.  She was hypotensive with EMS and was given fluids.  Patient is awake and denies any complaints.  She does not recall any bleeding and she is a very poor historian.  On review of her prior records it looks like she has had hematuria in the past and required transfusion.  The history is provided by the patient and the EMS personnel.       Home Medications Prior to Admission medications   Medication Sig Start Date End Date Taking? Authorizing Provider  atorvastatin (LIPITOR) 40 MG tablet TAKE (1) TABLET BY MOUTH EVERY EVENING. 06/21/22   Babs Sciara, MD  calcium carbonate (OSCAL) 1500 (600 Ca) MG TABS tablet Take 600 mg of elemental calcium by mouth daily with breakfast.    [provider]  Cholecalciferol (VITAMIN D) 50 MCG (2000 UT) CAPS Take 1 capsule (2,000 Units total) by mouth daily. 02/02/19   Eustaquio Boyden, MD  donepezil (ARICEPT) 10 MG tablet TAKE (1) TABLET BY MOUTH EVERY MORNING. Patient taking differently: Take 10 mg by mouth at bedtime. 06/21/22   Babs Sciara, MD  levETIRAcetam (KEPPRA) 750 MG tablet Take 1 tablet (750 mg total) by mouth 2 (two) times daily. 02/20/23   Azucena Fallen, MD  levothyroxine (SYNTHROID) 25 MCG tablet TAKE (1) TABLET BY MOUTH ONCE DAILY BEFORE BREAKFAST. Patient taking differently: Take 25 mcg by mouth daily before breakfast. 06/21/22   Babs Sciara, MD  magnesium oxide (MAG-OX) 400 (240 Mg) MG tablet Take 400 mg by mouth daily.    [provider]  melatonin 5 MG TABS Take 5 mg by mouth at bedtime.    [provider]  [name] 50  MG TB24 tablet TAKE (1) TABLET BY MOUTH AT BEDTIME. Patient taking differently: Take 50 mg by mouth daily. 06/21/22   Babs Sciara, MD  sertraline (ZOLOFT) 100 MG tablet TAKE (1) TABLET BY MOUTH EVERY MORNING. 06/21/22   Luking, Jonna Coup, MD  [name] 8 MG TB24 tablet TAKE (1) TABLET BY MOUTH ONCE DAILY. 06/21/22   Babs Sciara, MD      Allergies    Patient has no known allergies.    Review of Systems   Review of Systems  Physical Exam Updated Vital Signs BP (!) 94/58   Pulse 82   Temp 97.8 F (36.6 C) (Oral)   Resp 15   Ht 5\' 2"  (1.575 m)   Wt 49.9 kg   SpO2 96%   BMI 20.12 kg/m  Physical Exam Vitals and nursing note reviewed.  Constitutional:      General: She is not in acute distress.    Appearance: Normal appearance. She is well-developed.  HENT:     Head: Atraumatic.     Comments: She has an enlarged skull Eyes:     Conjunctiva/sclera: Conjunctivae normal.  Cardiovascular:     Rate and Rhythm: Normal rate and regular rhythm.     Heart sounds: No murmur heard. Pulmonary:     Effort: Pulmonary effort is normal. No respiratory distress.     Breath sounds: Normal breath sounds.  Abdominal:     Palpations: Abdomen is soft.     Tenderness: There is no abdominal tenderness. There is no guarding or rebound.  Musculoskeletal:        General: No deformity.     Cervical back: Neck supple.  Skin:    General: Skin is warm and dry.     Capillary Refill: Capillary refill takes less than 2 seconds.  Neurological:     Mental Status: She is alert. She is disoriented.     Comments: Patient is awake and will answer simple questions.  No gross facial asymmetry and no slurred speech.  She is very contracted and has minimal use of her legs     ED Results / Procedures / Treatments   Labs (all labs ordered are listed, but only abnormal results are displayed) Labs Reviewed  BASIC METABOLIC PANEL WITH GFR - Abnormal; Notable for the following components:      Result Value    Glucose, Bld 127 (*)    BUN 27 (*)    Calcium 8.6 (*)    All other components within normal limits  CBC WITH DIFFERENTIAL/PLATELET - Abnormal; Notable for the following components:   RBC 3.04 (*)    Hemoglobin 8.4 (*)    HCT 26.4 (*)    All other components within normal limits  URINALYSIS, W/ REFLEX TO CULTURE (INFECTION SUSPECTED) - Abnormal; Notable for the following components:   Color, Urine AMBER (*)    APPearance CLOUDY (*)    Hgb urine dipstick MODERATE (*)    Protein, ur 100 (*)    Leukocytes,Ua MODERATE (*)    Bacteria, UA RARE (*)    All other components within normal limits  CBC - Abnormal; Notable for the following components:   RBC 3.11 (*)    Hemoglobin 8.5 (*)    HCT 27.3 (*)    All other components within normal limits  URINE CULTURE  PROTIME-INR  CBC  BASIC METABOLIC PANEL WITH GFR  CBC  TYPE AND SCREEN  PREPARE RBC (CROSSMATCH)    EKG EKG Interpretation Date/Time:  Friday October 20 2023 12:05:58 EDT Ventricular Rate:  85 PR Interval:  131 QRS Duration:  90 QT Interval:  381 QTC Calculation: 453 R Axis:   1  Text Interpretation: Sinus rhythm Inferior infarct, old Anterolateral infarct, age indeterminate rate slower than prior 9/24 Confirmed by Meridee Score [phone]) on 10/20/2023 12:10:55 PM  Radiology CT Renal Stone Study Result Date: 10/20/2023 CLINICAL DATA:  gross hematuria. EXAM: CT ABDOMEN AND PELVIS WITHOUT CONTRAST TECHNIQUE: Multidetector CT imaging of the abdomen and pelvis was performed following the standard protocol without IV contrast. RADIATION DOSE REDUCTION: This exam was performed according to the departmental dose-optimization program which includes automated exposure control, adjustment of the mA and/or kV according to patient size and/or use of iterative reconstruction technique. COMPARISON:  CT scan abdomen and pelvis from 05/17/2019. FINDINGS: Lower chest: There are atelectatic changes at the lung bases. No dense consolidation or  lung mass. No pleural effusion. The heart is normal in size. No pericardial effusion. Hepatobiliary: The liver is normal in size. Non-cirrhotic configuration. No suspicious mass. No intrahepatic or extrahepatic bile duct dilation. Gallbladder is surgically absent. Pancreas: Unremarkable. No pancreatic ductal dilatation or surrounding inflammatory changes. Spleen: Within normal limits. No focal lesion. Adrenals/Urinary Tract: Adrenal glands are unremarkable. No suspicious renal mass within the limitations of this unenhanced exam. Bilateral extrarenal pelves noted. There is bilateral mild-to-moderate hydronephrosis and hydroureter. No nephroureterolithiasis on either side. Markedly  distended urinary bladder. No focal mass or bladder calculi. Stomach/Bowel: There is a small-to-moderate sliding hiatal hernia. There is a small diverticulum arising from the third part of duodenum no disproportionate dilation of the small or large bowel loops. The appendix was not visualized; however there is no acute inflammatory process in the right lower quadrant. The rectum is dilated measuring up to 9 cm in diameter and exhibit mild circumferential wall thickening. There is mild perirectal fat stranding. In appropriate clinical settings findings favor proctitis. Vascular/Lymphatic: No ascites or pneumoperitoneum. No abdominal or pelvic lymphadenopathy, by size criteria. No aneurysmal dilation of the major abdominal arteries. There are moderate peripheral atherosclerotic vascular calcifications of the aorta and its major branches. Reproductive: The uterus is unremarkable. No large adnexal mass. Other: There is a tiny fat containing umbilical hernia. Mild anasarca. The soft tissues and abdominal wall are otherwise unremarkable. Musculoskeletal: No suspicious osseous lesions. There are mild - moderate multilevel degenerative changes in the visualized spine. IMPRESSION: 1. Markedly distended urinary bladder with resultant mild-to-moderate  bilateral hydronephrosis and hydroureter. No nephroureterolithiasis on either side. 2. Dilated rectum with mild circumferential wall thickening and perirectal fat stranding. In appropriate clinical settings findings favor proctitis. 3. Multiple other nonacute observations, as described above. Aortic Atherosclerosis (ICD10-I70.0). Electronically Signed   By: Jules Schick M.D.   On: 10/20/2023 14:56    Procedures .Critical Care  Performed by: Terrilee Files, MD Authorized by: Terrilee Files, MD   Critical care provider statement:    Critical care time (minutes):  45   Critical care time was exclusive of:  Separately billable procedures and treating other patients   Critical care was necessary to treat or prevent imminent or life-threatening deterioration of the following conditions:  Circulatory failure   Critical care was time spent personally by me on the following activities:  Development of treatment plan with patient or surrogate, discussions with consultants, evaluation of patient's response to treatment, examination of patient, obtaining history from patient or surrogate, ordering and performing treatments and interventions, ordering and review of laboratory studies, ordering and review of radiographic studies, pulse oximetry, re-evaluation of patient's condition and review of old charts   I assumed direction of critical care for this patient from another provider in my specialty: no       Medications Ordered in ED Medications  metroNIDAZOLE (FLAGYL) IVPB 500 mg ( Intravenous Infusion Verify 10/20/23 1705)  sodium chloride irrigation 0.9 % 3,000 mL (has no administration in time range)  atorvastatin (LIPITOR) tablet 40 mg (has no administration in time range)  donepezil (ARICEPT) tablet 10 mg (has no administration in time range)  mirtazapine (REMERON) tablet 15 mg (has no administration in time range)  sertraline (ZOLOFT) tablet 50 mg (has no administration in time range)   levothyroxine (SYNTHROID) tablet 50 mcg (has no administration in time range)  mirabegron ER ([name]) tablet 50 mg (has no administration in time range)  fesoterodine ([name]) tablet 8 mg (has no administration in time range)  melatonin tablet 3 mg (has no administration in time range)  levETIRAcetam (KEPPRA) tablet 750 mg (has no administration in time range)  cholecalciferol (VITAMIN D3) 25 MCG (1000 UNIT) tablet 2,000 Units (has no administration in time range)  calcium carbonate (OS-CAL - dosed in mg of elemental calcium) tablet 1,250 mg (has no administration in time range)  magnesium oxide (MAG-OX) tablet 400 mg (has no administration in time range)  potassium chloride SA (KLOR-CON M) CR tablet 20 mEq (has no administration in time  range)  acetaminophen (TYLENOL) tablet 650 mg (has no administration in time range)    Or  acetaminophen (TYLENOL) suppository 650 mg (has no administration in time range)  0.9 %  sodium chloride infusion (Manually program via Guardrails IV Fluids) (has no administration in time range)  sodium chloride 0.9 % bolus 1,000 mL (0 mLs Intravenous Stopped 10/20/23 1405)  sodium chloride 0.9 % bolus 1,000 mL (0 mLs Intravenous Stopped 10/20/23 1521)  cefTRIAXone (ROCEPHIN) 1 g in sodium chloride 0.9 % 100 mL IVPB (0 g Intravenous Stopped 10/20/23 1521)  cefTRIAXone (ROCEPHIN) 1 g in sodium chloride 0.9 % 100 mL IVPB (0 g Intravenous Stopped 10/20/23 1620)    ED Course/ Medical Decision Making/ A&P Clinical Course as of 10/20/23 1732  Fri Oct 20, 2023  1204 ED tech did an In-N-Out catheter and did not see any evidence of vaginal bleeding but she did have hematuria. [MB]  1413 I called patient's husband who is listed as her emergency contact and responsible party.  There was no answer. [MB]  1413 On review of prior records she was admitted back in the fall for hematuria and bladder mass.  It sounds like it improved with irrigation and taking her off her aspirin and  Plavix.  She [facility] up with [facility] after that and it does not sound like any significant intervention was done. [MB]  1440 Patient CT not read yet but her bladder looks huge on the scan.  I asked the nurse to put a Foley in her.  She quickly put out a liter and is still going so will clamp for now and let her equilibrate. [MB]  1512 Discussed with [facility] hospitalist Dr. Randol Kern a who will evaluate patient for admission [MB]    Clinical Course User Index [MB] Terrilee Files, MD                                 Medical Decision Making Amount and/or Complexity of Data Reviewed Labs: ordered. Radiology: ordered.  Risk Decision regarding hospitalization.   This patient complains of possible vaginal bleeding; this involves an extensive number of treatment Options and is a complaint that carries with it a high risk of complications and morbidity. The differential includes vaginal bleeding, hematuria, anemia, hypotension, sepsis  I ordered, reviewed and interpreted labs, which included CBC with normal white count low hemoglobin, chemistries with preserved renal function, urinalysis with hematuria possible signs of infection, lactate normal, urinalysis sent for culture I ordered medication IV fluids IV antibiotics and reviewed PMP when indicated. I ordered imaging studies which included CT renal and I independently    visualized and interpreted imaging which showed markedly enlarged bladder with hydronephrosis no obvious obstruction Additional history obtained from EMS Previous records obtained and reviewed in epic, patient admitted last fall for hematuria and evaluation by urology and cystoscopy I consulted [facility] hospitalist Dr. Randol Kern and discussed lab and imaging findings and discussed disposition.  Cardiac monitoring reviewed, sinus rhythm Social determinants considered, physically inactive Critical Interventions: Initiation of fluids and antibiotics for patient's  hypotension  After the interventions stated above, I reevaluated the patient and found patient's blood pressure to be normalizing Admission and further testing considered, she would benefit from [facility] the hospital for further management of her hypotension possible UTI possible urinary retention         Final Clinical Impression(s) / ED Diagnoses Final diagnoses:  Urinary retention  Gross hematuria  Rx / DC Orders ED Discharge Orders     None         Terrilee Files, MD 10/20/23 1735

## 2023-10-20 NOTE — ED Triage Notes (Signed)
 Pt bib [facility] from [facility] for vaginal bleeding noted last night by pts husband. Pt has the hx of the same requiring blood transfusions. No bleeding noted at this time. Pt hypotensive in the 70s systolic w/ EMS. Pt now 94/58

## 2023-10-20 NOTE — TOC Initial Note (Signed)
 Transition of Care [facility]) - Initial/Assessment Note    Patient Details  Name: Leah Payne MRN: [id number] Date of Birth: 1945-08-03  [facility] CM/SW Contact:    Barron Alvine, RN Phone Number: 10/20/2023, 8:35 PM  Clinical Narrative:   78 y/o female,  with history of dementia, congenital hydrocephalus, seizure disorder,  overactive bladder, HTN, HLD, WPW syndrome, hematuria,  From [facility] and plans to return there at discharge. TOC to follow.                 Expected Discharge Plan: Skilled Nursing Facility Barriers to Discharge: Continued Medical Work up   Patient Goals and CMS Choice Patient states their goals for this hospitalization and ongoing recovery are:: Return to [facility].          Expected Discharge Plan and Services In-house Referral: Clinical Social Work Discharge Planning Services: CM Consult Post Acute Care Choice: Skilled Nursing Facility Living arrangements for the past 2 months: Skilled Nursing Facility                                    Prior Living Arrangements/Services Living arrangements for the past 2 months: Skilled Nursing Facility Lives with:: Facility Resident Patient language and need for interpreter reviewed:: Yes Do you feel safe going back to the place where you live?: Yes      Need for Family Participation in Patient Care: Yes (Comment) Care giver support system in place?: Yes (comment) Current home services: DME (walker) Criminal Activity/Legal Involvement Pertinent to Current Situation/Hospitalization: No - Comment as needed  Activities of Daily Living   ADL Screening (condition at time of admission) Independently performs ADLs?: No Does the patient have a NEW difficulty with bathing/dressing/toileting/self-feeding that is expected to last >3 days?: Yes (Initiates electronic notice to provider for possible OT consult) Does the patient have a NEW difficulty with getting  in/out of bed, walking, or climbing stairs that is expected to last >3 days?: Yes (Initiates electronic notice to provider for possible PT consult) Does the patient have a NEW difficulty with communication that is expected to last >3 days?: Yes (Initiates electronic notice to provider for possible SLP consult) Is the patient deaf or have difficulty hearing?: No Does the patient have difficulty seeing, even when wearing glasses/contacts?: No Does the patient have difficulty concentrating, remembering, or making decisions?: No  Permission Sought/Granted                  Emotional Assessment   Attitude/Demeanor/Rapport: Other (comment) (somnolent) Affect (typically observed): Calm, Quiet Orientation: : Oriented to Self, Oriented to Place Alcohol / Substance Use: Not Applicable Psych Involvement: No (comment)  Admission diagnosis:  Urinary retention [R33.9] Gross hematuria [R31.0] Hematuria [R31.9] Patient Active Problem List   Diagnosis Date Noted   Hematuria 10/20/2023   Hemorrhagic shock (HCC) 04/11/2023   Lactic acidosis 04/11/2023   Acute blood loss anemia 04/11/2023   Hyperglycemia 04/11/2023   Bladder mass 04/05/2023   Gross hematuria 04/04/2023   History of ischemic stroke 02/16/2023   Hypokalemia 02/15/2023   Gait instability 02/15/2023   Major neurocognitive disorder (HCC) 02/15/2023   Generalized weakness 02/14/2023   Asymmetrical hearing loss 02/25/2020   Vitamin D deficiency 03/21/2017   Dizziness 03/23/2016   Alzheimer disease (HCC) 12/17/2015   WPW (Wolff-Parkinson-White syndrome)    Type 2 diabetes, controlled, 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 [place] [address] Phone: [phone] Fax: [phone]  [facility] [place] - [address] [place] [address] Phone: [phone] Fax: [phone]  [facility] [place], [place] - [address] [place] [address] Phone: [phone] Fax: [phone]     Social Drivers of Health (SDOH) Social History: SDOH Screenings   Food Insecurity: No Food Insecurity (10/20/2023)  Housing: Low Risk  (10/20/2023)  Transportation Needs: No Transportation Needs (10/20/2023)  Utilities: Not At Risk (10/20/2023)  Alcohol Screen: Low Risk  (07/06/2021)  Depression (PHQ2-9): Low Risk  (07/06/2021)  Financial Resource Strain: Low Risk  (07/06/2021)  Physical Activity: Inactive (07/06/2021)  Social Connections: Moderately Isolated (10/20/2023)  Stress: No Stress Concern Present (07/06/2021)  Tobacco Use: Low Risk  (10/20/2023)   SDOH Interventions:     Readmission Risk Interventions    10/20/2023    8:27 PM 04/13/2023    5:45 PM  Readmission Risk Prevention Plan  Transportation Screening Complete Complete  PCP or Specialist Appt within 5-7 Days Complete Complete  Home Care Screening Complete Complete  Medication Review (RN CM) Complete Complete

## 2023-10-21 DIAGNOSIS — G40909 Epilepsy, unspecified, not intractable, without status epilepticus: Secondary | ICD-10-CM | POA: Diagnosis not present

## 2023-10-21 DIAGNOSIS — R339 Retention of urine, unspecified: Principal | ICD-10-CM

## 2023-10-21 DIAGNOSIS — Q039 Congenital hydrocephalus, unspecified: Secondary | ICD-10-CM

## 2023-10-21 DIAGNOSIS — R310 Gross hematuria: Secondary | ICD-10-CM | POA: Diagnosis not present

## 2023-10-21 DIAGNOSIS — F0280 Dementia in other diseases classified elsewhere without behavioral disturbance: Secondary | ICD-10-CM

## 2023-10-21 DIAGNOSIS — D62 Acute posthemorrhagic anemia: Secondary | ICD-10-CM | POA: Diagnosis not present

## 2023-10-21 DIAGNOSIS — G309 Alzheimer's disease, unspecified: Secondary | ICD-10-CM

## 2023-10-21 LAB — CBC
HCT: 28.4 % — ABNORMAL LOW (ref 36.0–46.0)
Hemoglobin: 8.9 g/dL — ABNORMAL LOW (ref 12.0–15.0)
MCH: 27.2 pg (ref 26.0–34.0)
MCHC: 31.3 g/dL (ref 30.0–36.0)
MCV: 86.9 fL (ref 80.0–100.0)
Platelets: 322 10*3/uL (ref 150–400)
RBC: 3.27 MIL/uL — ABNORMAL LOW (ref 3.87–5.11)
RDW: 14.5 % (ref 11.5–15.5)
WBC: 6.6 10*3/uL (ref 4.0–10.5)
nRBC: 0 % (ref 0.0–0.2)

## 2023-10-21 LAB — BASIC METABOLIC PANEL WITH GFR
Anion gap: 8 (ref 5–15)
BUN: 17 mg/dL (ref 8–23)
CO2: 23 mmol/L (ref 22–32)
Calcium: 8.7 mg/dL — ABNORMAL LOW (ref 8.9–10.3)
Chloride: 109 mmol/L (ref 98–111)
Creatinine, Ser: 0.58 mg/dL (ref 0.44–1.00)
GFR, Estimated: >60 mL/min (ref >60)
Glucose, Bld: 83 mg/dL (ref 70–99)
Potassium: 3.9 mmol/L (ref 3.5–5.1)
Sodium: 140 mmol/L (ref 135–145)

## 2023-10-21 NOTE — Plan of Care (Signed)

## 2023-10-21 NOTE — Progress Notes (Signed)
 Progress Note   Patient: Leah Payne ZOX:[id number] DOB: [birth year] DOA: 10/20/2023     1 DOS: the patient was seen and examined on 10/21/2023   Brief hospital patient narrative: As per H&P written by Dr. Randol Kern on 10/20/2023 Leah Payne  is a 78 y.o. female,  with PMH of congenital hydrocephalus, seizure disorder, hematuria, overactive bladder, dementia, HTN, HLD, WPW syndrome , patient with known history of hematuria,  -He had hospitalization last September at [facility] for hematuria for which he underwent CT urogram which was suspicious for neoplastic disease and was treated with CBI and discharged on 9/13 (cystoscopy during that admission found bladder mass, which was biopsy, biopsy was significant for acute on chronic inflammation with no malignancy) discharged with Foley, she was readmitted again at [facility] for gross hematuria and hemorrhagic shock, resolved after blood transfusion, stabilized after blood transfusions and holding her aspirin and Plavix. -And with advanced dementia, congenital hydrocephalus unable to provide unreliable history, it was obtained from ED staff and previous records -Patient was sent from her facility for what thought to be a vaginal bleeding, was hypotensive with EMS, she is awake herself, denies any complaints, but cannot provide any good history, open evaluation in ED, it was noted to have gross hematuria not vaginal bleeding, CT abdomen pelvis was obtained which did show evidence of urinary retention with bilateral hydronephrosis, Foley catheter was inserted with total of 2350 cc output gross hematuria, most recent hemoglobin was 13 last September, it is 8.4 this admission, [facility] hospitalist consulted to admit.   Assessment and Plan: 1-blood loss anemia/hemorrhagic shock and gross hematuria -Bilateral hydronephrosis and distended bladder appreciated on images at time of admission -No kidney stones seen -Foley catheter placement along  with irrigation has been instructed by urology service -On today's examination Foley bag with clear urine seen. -Patient hemodynamically stable and reporting no nausea, no vomiting, no suprapubic pain and is afebrile. -Will follow culture results -Continue IV antibiotics and continue supportive care -Hemoglobin stable after blood transfusion -Continue to follow hemoglobin trend  2-proctitis -Unclear if related in proximity to bladder distention -Will continue empirical treatment with IV antibiotic -Follow clinical response.  3-history of congenital hydrocephalus/epilepsy -Continue Keppra -No seizure appreciated.  4-history of dementia with behavioral disturbance -Continue treatment with Aricept -Continue the use of Remeron and Zoloft -Stable mood appreciated.  5-history of CVA -Holding aspirin and Plavix in the setting of acute hematuria and blood loss anemia -Will follow clearance by urology service for resumption of secondary prevention.  6-hypothyroidism -Continue Synthroid  7-history of vitamin D deficiency -Continue calcium and vitamin D supplementation.  8-hyperlipidemia -Continue statin.   Subjective:  Afebrile, no chest pain, no nausea, no vomiting.  Following commands appropriately and in no acute distress.  After bladder irrigation hematuria process has subsided.  Clean urine appreciated in Foley bag.  Physical Exam: Vitals:   10/20/23 2201 10/21/23 0204 10/21/23 0427 10/21/23 1410  BP: 138/69 133/74 125/66 137/73  Pulse: 100 92 91 93  Resp: 20 16 16 17   Temp: 98.8 F (37.1 C) 98.2 F (36.8 C) 98.4 F (36.9 C) 98.4 F (36.9 C)  TempSrc:  Oral Oral Oral  SpO2: 95% 99% 96% 95%  Weight:      Height:       General exam: Alert, awake, following commands appropriately and in no acute distress. Respiratory system: good saturation on room air Cardiovascular system:RRR. No rubs or gallops; no JVD. Gastrointestinal system: Abdomen is nondistended, soft and  nontender. No organomegaly or masses felt. Normal bowel sounds heard. Central nervous system: No focal neurological deficits. Extremities: No cyanosis or clubbing. Skin: No petechiae; Foley catheter in place. Psychiatry: Mood & affect appropriate.    Data Reviewed: Basic metabolic panel: Sodium 140, potassium 3.9, chloride 109, bicarb 23, BUN 17, creatinine 0.58 and GFR >60 CBC: WBC 6.6, hemoglobin 8.9 platelet count 3 22K  Family Communication: No family at bedside.  Disposition: Status is: Inpatient Remains inpatient appropriate because: IV therapy.   Planned Discharge Destination: Skilled nursing facility (long-term resident).   Time spent: 50 minutes  Author: Vassie Loll, MD 10/21/2023 6:33 PM  For on call review [email or web address].

## 2023-10-21 NOTE — Consult Note (Signed)
 Urology Consult  Referring physician: Dr. Randol Kern Reason for referral: Gross hematuria  Chief Complaint: difficulty urinating  History of Present Illness: Leah Payne is a 78yo with a history of DMII and dementia who was admitted with gross hematuria and urinary retention. The patient is a poor historian but doe not recall any difficulty urinating in the past week. She denies gross hematuria although she was noted to have gross hematuria after foley placement. She has a history of gross hematuria in the past requiring transfusion. Ct stone study obtained in the ER shows a distended bladder with bilateral hydronephrosis. She was initially started on CBI but the CBI has been discontinued because her urine is currently clear. Urine culture growing ecoli.   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 Allergies: No Known Allergies  Family History  Problem Relation Age of Onset   Stroke Mother 23   Pneumonia Mother    CAD Father 68   Alzheimer's disease Father    Pneumonia Father    Cancer  Maternal Aunt        lung (non-smoker)   Diabetes Neg Hx    Social History:  reports that she has never smoked. She has never used smokeless tobacco. She reports that she does not drink alcohol and does not use drugs.  Review of Systems  Genitourinary:  Positive for hematuria.  All other systems reviewed and are negative.   Physical Exam:  Vital signs in last 24 hours: Temp:  [98.2 F (36.8 C)-98.8 F (37.1 C)] 98.4 F (36.9 C) (03/29 1410) Pulse Rate:  [91-100] 93 (03/29 1410) Resp:  [16-20] 17 (03/29 1410) BP: (125-138)/(66-74) 137/73 (03/29 1410) SpO2:  [95 %-99 %] 95 % (03/29 1410) Physical Exam Vitals reviewed.  Constitutional:      General: She is not in acute distress. HENT:     Mouth/Throat:     Mouth: Mucous membranes are dry.  Eyes:     Extraocular Movements: Extraocular movements intact.     Pupils: Pupils are equal, round, and reactive to light.  Cardiovascular:     Rate and Rhythm: Normal rate and regular rhythm.  Pulmonary:     Effort: Pulmonary effort is normal. No respiratory distress.  Abdominal:     General: Abdomen is flat. There is no distension.  Musculoskeletal:        General: No swelling. Normal range of motion.     Cervical back: Normal range of motion and neck supple.  Skin:    General: Skin is warm and dry.  Neurological:  Mental Status: She is alert. Mental status is at baseline.  Psychiatric:        Mood and Affect: Mood normal.     Laboratory Data:  Results for orders placed or performed during the hospital encounter of 10/20/23 (from the past 72 hours)  Urinalysis, w/ Reflex to Culture (Infection Suspected) -Urine, Catheterized     Status: Abnormal   Collection Time: 10/20/23 11:37 AM  Result Value Ref Range   Specimen Source URINE, CATHETERIZED    Color, Urine AMBER (A) YELLOW   APPearance CLOUDY (A) CLEAR   Specific Gravity, Urine 1.013 1.005 - 1.030   pH 7.0 5.0 - 8.0   Glucose, UA NEGATIVE NEGATIVE mg/dL   Hgb urine  dipstick MODERATE (A) NEGATIVE   Bilirubin Urine NEGATIVE NEGATIVE   Ketones, ur NEGATIVE NEGATIVE mg/dL   Protein, ur 161 (A) NEGATIVE mg/dL   Nitrite NEGATIVE NEGATIVE   Leukocytes,Ua MODERATE (A) NEGATIVE   RBC / HPF >50 0 - 5 RBC/hpf   WBC, UA >50 0 - 5 WBC/hpf    Comment:        Reflex urine culture not performed if WBC <=10, OR if Squamous epithelial cells >5. If Squamous epithelial cells >5 suggest recollection.    Bacteria, UA RARE (A) NONE SEEN   Squamous Epithelial / HPF 0-5 0 - 5 /HPF   WBC Clumps PRESENT     Comment: Performed at [facility], [address], [place], [place] [address]  Urine Culture     Status: Abnormal (Preliminary result)   Collection Time: 10/20/23 11:37 AM   Specimen: Urine, Random  Result Value Ref Range   Specimen Description      URINE, RANDOM Performed at [facility], [address], [place], [place] [address]    Special Requests      NONE Reflexed from [id number] Performed at [facility] [address], [place], [place] [address]    Culture (A)     >=100,000 COLONIES/mL ESCHERICHIA COLI SUSCEPTIBILITIES TO FOLLOW Performed at [facility], [address], [place], [place] [address]    Report Status PENDING   Basic metabolic panel     Status: Abnormal   Collection Time: 10/20/23 11:59 AM  Result Value Ref Range   Sodium 136 135 - 145 mmol/L   Potassium 4.3 3.5 - 5.1 mmol/L   Chloride 102 98 - 111 mmol/L   CO2 27 22 - 32 mmol/L   Glucose, Bld 127 (H) 70 - 99 mg/dL    Comment: Glucose reference range applies only to samples taken after fasting for at least 8 hours.   BUN 27 (H) 8 - 23 mg/dL   Creatinine, Ser 6.57 0.44 - 1.00 mg/dL   Calcium 8.6 (L) 8.9 - 10.3 mg/dL   GFR, Estimated >84 >69 mL/min    Comment: (NOTE) Calculated using the CKD-EPI Creatinine Equation (2021)    Anion gap 7 5 - 15    Comment: Performed at [facility], [address] [place], [place] [address]  CBC with Differential     Status: Abnormal    Collection Time: 10/20/23 11:59 AM  Result Value Ref Range   WBC 6.4 4.0 - 10.5 K/uL   RBC 3.04 (L) 3.87 - 5.11 MIL/uL   Hemoglobin 8.4 (L) 12.0 - 15.0 g/dL   HCT 84.1 (L) 32.4 - 40.1 %   MCV 86.8 80.0 - 100.0 fL   MCH 27.6 26.0 - 34.0 pg   MCHC 31.8 30.0 - 36.0 g/dL  RDW 14.5 11.5 - 15.5 %   Platelets 252 150 - 400 K/uL   nRBC 0.0 0.0 - 0.2 %   Neutrophils Relative % 67 %   Neutro Abs 4.3 1.7 - 7.7 K/uL   Lymphocytes Relative 20 %   Lymphs Abs 1.3 0.7 - 4.0 K/uL   Monocytes Relative 10 %   Monocytes Absolute 0.7 0.1 - 1.0 K/uL   Eosinophils Relative 2 %   Eosinophils Absolute 0.1 0.0 - 0.5 K/uL   Basophils Relative 0 %   Basophils Absolute 0.0 0.0 - 0.1 K/uL   Immature Granulocytes 1 %   Abs Immature Granulocytes 0.03 0.00 - 0.07 K/uL    Comment: Performed at [facility], [address], [place], [place] [address]  Type and screen [facility]     Status: None (Preliminary result)   Collection Time: 10/20/23 11:59 AM  Result Value Ref Range   ABO/RH(D) A POS    Antibody Screen NEG    Sample Expiration      10/23/2023,2359 Performed at [facility], [address], [place], [place] [address]    Unit Number [id number]    Blood Component Type RED CELLS,LR    Unit division 00    Status of Unit ALLOCATED    Transfusion Status OK TO TRANSFUSE    Crossmatch Result Compatible    Unit Number [id number]    Blood Component Type RED CELLS,LR    Unit division 00    Status of Unit ALLOCATED    Transfusion Status OK TO TRANSFUSE    Crossmatch Result Compatible   Protime-INR     Status: None   Collection Time: 10/20/23 11:59 AM  Result Value Ref Range   Prothrombin Time 14.5 11.4 - 15.2 seconds   INR 1.1 0.8 - 1.2    Comment: (NOTE) INR goal varies based on device and disease states. Performed at [facility], [address], [place], [place] [address]   CBC     Status: Abnormal   Collection Time: 10/20/23  3:50 PM  Result Value Ref Range   WBC 5.9 4.0 - 10.5  K/uL   RBC 3.11 (L) 3.87 - 5.11 MIL/uL   Hemoglobin 8.5 (L) 12.0 - 15.0 g/dL   HCT 84.1 (L) 32.4 - 40.1 %   MCV 87.8 80.0 - 100.0 fL   MCH 27.3 26.0 - 34.0 pg   MCHC 31.1 30.0 - 36.0 g/dL   RDW 02.7 25.3 - 66.4 %   Platelets 255 150 - 400 K/uL   nRBC 0.0 0.0 - 0.2 %    Comment: Performed at [facility], [address], [place], [place] [address]  Prepare RBC (crossmatch)     Status: None   Collection Time: 10/20/23  3:59 PM  Result Value Ref Range   Order Confirmation      ORDER PROCESSED BY BLOOD BANK Performed at [facility], [address] [place], [place] [address]   CBC     Status: Abnormal   Collection Time: 10/20/23  9:08 PM  Result Value Ref Range   WBC 7.3 4.0 - 10.5 K/uL   RBC 3.18 (L) 3.87 - 5.11 MIL/uL   Hemoglobin 8.8 (L) 12.0 - 15.0 g/dL   HCT 63.8 (L) 75.6 - 43.3 %   MCV 87.1 80.0 - 100.0 fL   MCH 27.7 26.0 - 34.0 pg   MCHC 31.8 30.0 - 36.0 g/dL   RDW 29.5 18.8 - 41.6 %   Platelets 309 150 - 400 K/uL  nRBC 0.0 0.0 - 0.2 %    Comment: Performed at [facility], [address], [place], [place] [address]  Basic metabolic panel     Status: Abnormal   Collection Time: 10/21/23  4:03 AM  Result Value Ref Range   Sodium 140 135 - 145 mmol/L   Potassium 3.9 3.5 - 5.1 mmol/L   Chloride 109 98 - 111 mmol/L   CO2 23 22 - 32 mmol/L   Glucose, Bld 83 70 - 99 mg/dL    Comment: Glucose reference range applies only to samples taken after fasting for at least 8 hours.   BUN 17 8 - 23 mg/dL   Creatinine, Ser 1.19 0.44 - 1.00 mg/dL   Calcium 8.7 (L) 8.9 - 10.3 mg/dL   GFR, Estimated >14 >78 mL/min    Comment: (NOTE) Calculated using the CKD-EPI Creatinine Equation (2021)    Anion gap 8 5 - 15    Comment: Performed at [facility], [address], [place], [place] [address]  CBC     Status: Abnormal   Collection Time: 10/21/23  4:03 AM  Result Value Ref Range   WBC 6.6 4.0 - 10.5 K/uL   RBC 3.27 (L) 3.87 - 5.11 MIL/uL   Hemoglobin 8.9 (L) 12.0 - 15.0 g/dL   HCT  13.0 (L) 86.5 - 46.0 %   MCV 86.9 80.0 - 100.0 fL   MCH 27.2 26.0 - 34.0 pg   MCHC 31.3 30.0 - 36.0 g/dL   RDW 78.4 69.6 - 29.5 %   Platelets 322 150 - 400 K/uL   nRBC 0.0 0.0 - 0.2 %    Comment: Performed at [facility], [address], [place], [place] [address]   Recent Results (from the past 240 hours)  Urine Culture     Status: Abnormal (Preliminary result)   Collection Time: 10/20/23 11:37 AM   Specimen: Urine, Random  Result Value Ref Range Status   Specimen Description   Final    URINE, RANDOM Performed at [facility], [address] [place], [place] [address]    Special Requests   Final    NONE Reflexed from [id number] Performed at [facility], [address] [place], [place] [address]    Culture (A)  Final    >=100,000 COLONIES/mL ESCHERICHIA COLI SUSCEPTIBILITIES TO FOLLOW Performed at [facility], [address] [place], [place] [address]    Report Status PENDING  Incomplete   Creatinine: Recent Labs    10/20/23 1159 10/21/23 0403  CREATININE 0.72 0.58   Baseline Creatinine: 0.5  Impression/Assessment:  77yo with gross hematuria and urinary retention  Plan:  Gross hematuria: I discussed the natural history of gross hematuria and the various causes of gross hematuria. The patients hematuria is likely related to acute bacterial cystitis and marked bladder distension followed by rapid decompression. Please continue broad spectrum antibiotics pending urine culture Urine retention: Please continue indwelling foley. The patient should be discharged home with the foley in place and she should followup in 2 weeks for a voiding trial  Wilkie Aye 10/21/2023, 7:39 PM

## 2023-10-22 ENCOUNTER — Inpatient Hospital Stay (HOSPITAL_COMMUNITY)

## 2023-10-22 DIAGNOSIS — R310 Gross hematuria: Secondary | ICD-10-CM | POA: Diagnosis not present

## 2023-10-22 DIAGNOSIS — R339 Retention of urine, unspecified: Secondary | ICD-10-CM | POA: Diagnosis not present

## 2023-10-22 DIAGNOSIS — D62 Acute posthemorrhagic anemia: Secondary | ICD-10-CM | POA: Diagnosis not present

## 2023-10-22 DIAGNOSIS — G40909 Epilepsy, unspecified, not intractable, without status epilepticus: Secondary | ICD-10-CM | POA: Diagnosis not present

## 2023-10-22 LAB — CBC
HCT: 28.8 % — ABNORMAL LOW (ref 36.0–46.0)
Hemoglobin: 9.4 g/dL — ABNORMAL LOW (ref 12.0–15.0)
MCH: 27.6 pg (ref 26.0–34.0)
MCHC: 32.6 g/dL (ref 30.0–36.0)
MCV: 84.5 fL (ref 80.0–100.0)
Platelets: 419 10*3/uL — ABNORMAL HIGH (ref 150–400)
RBC: 3.41 MIL/uL — ABNORMAL LOW (ref 3.87–5.11)
RDW: 14.2 % (ref 11.5–15.5)
WBC: 7.7 10*3/uL (ref 4.0–10.5)
nRBC: 0 % (ref 0.0–0.2)

## 2023-10-22 LAB — URINE CULTURE: Culture: >=100000 — AB

## 2023-10-22 LAB — BASIC METABOLIC PANEL WITH GFR
Anion gap: 11 (ref 5–15)
BUN: 17 mg/dL (ref 8–23)
CO2: 22 mmol/L (ref 22–32)
Calcium: 8.6 mg/dL — ABNORMAL LOW (ref 8.9–10.3)
Chloride: 103 mmol/L (ref 98–111)
Creatinine, Ser: 0.56 mg/dL (ref 0.44–1.00)
GFR, Estimated: >60 mL/min (ref >60)
Glucose, Bld: 120 mg/dL — ABNORMAL HIGH (ref 70–99)
Potassium: 3.4 mmol/L — ABNORMAL LOW (ref 3.5–5.1)
Sodium: 136 mmol/L (ref 135–145)

## 2023-10-22 MED ORDER — SODIUM CHLORIDE 0.9 % IV SOLN
1.0000 g | INTRAVENOUS | Status: DC
  Administered 2023-10-22: 1 g via INTRAVENOUS
  Filled 2023-10-22: qty 10

## 2023-10-22 MED ORDER — SODIUM CHLORIDE 0.9 % IV SOLN
1.0000 g | INTRAVENOUS | Status: DC
  Administered 2023-10-22 – 2023-10-24 (×3): 1 g via INTRAVENOUS
  Filled 2023-10-22 (×4): qty 1000

## 2023-10-22 NOTE — Plan of Care (Signed)

## 2023-10-22 NOTE — Progress Notes (Signed)
 Progress Note   Patient: Leah Payne:[id number] DOB: 20-Sep-1945 DOA: 10/20/2023     2 DOS: the patient was seen and examined on 10/22/2023   Brief hospital patient narrative: As per H&P written by Dr. Randol Kern on 10/20/2023 Leah Payne  is a 78 y.o. female,  with PMH of congenital hydrocephalus, seizure disorder, hematuria, overactive bladder, dementia, HTN, HLD, WPW syndrome , patient with known history of hematuria,  -He had hospitalization last September at [facility] for hematuria for which he underwent CT urogram which was suspicious for neoplastic disease and was treated with CBI and discharged on 9/13 (cystoscopy during that admission found bladder mass, which was biopsy, biopsy was significant for acute on chronic inflammation with no malignancy) discharged with Foley, she was readmitted again at [facility] for gross hematuria and hemorrhagic shock, resolved after blood transfusion, stabilized after blood transfusions and holding her aspirin and Plavix. -And with advanced dementia, congenital hydrocephalus unable to provide unreliable history, it was obtained from ED staff and previous records -Patient was sent from her facility for what thought to be a vaginal bleeding, was hypotensive with EMS, she is awake herself, denies any complaints, but cannot provide any good history, open evaluation in ED, it was noted to have gross hematuria not vaginal bleeding, CT abdomen pelvis was obtained which did show evidence of urinary retention with bilateral hydronephrosis, Foley catheter was inserted with total of 2350 cc output gross hematuria, most recent hemoglobin was 13 last September, it is 8.4 this admission, [facility] hospitalist consulted to admit.   Assessment and Plan: 1-blood loss anemia/hemorrhagic shock and gross hematuria -Bilateral hydronephrosis and distended bladder appreciated on images at time of admission -No kidney stones seen -Foley catheter placement along  with irrigation has been instructed by urology service.  Following urology recommendations plan is to discharge patient with Foley catheter in place. -Foley bag continues to demonstrate clear urine.  No further hematuria. -Patient hemodynamically stable and reporting no nausea, no vomiting, no suprapubic pain and is afebrile. -Patient urine culture demonstrating ESBL; antibiotics transition to ertapenem. -Hemoglobin stable after blood transfusion -Continue to follow hemoglobin trend; so far stable (9.4 today).  2-proctitis -Unclear if related in proximity to bladder distention -Continue antibiotic therapy.  3-history of congenital hydrocephalus/epilepsy -Continue Keppra -No seizure appreciated.  4-history of dementia with behavioral disturbance -Continue treatment with Aricept -Continue the use of Remeron and Zoloft -Stable mood appreciated.  5-history of CVA -Holding aspirin and Plavix in the setting of acute hematuria and blood loss anemia -Will follow clearance by urology service for resumption of secondary prevention.  6-hypothyroidism -Continue Synthroid  7-history of vitamin D deficiency -Continue calcium and vitamin D supplementation.  8-hyperlipidemia -Continue statin.   Subjective:  Clear urine appreciated; no chest pain, no nausea, no vomiting.  Patient is afebrile.  Physical Exam: Vitals:   10/21/23 1410 10/21/23 1940 10/22/23 0521 10/22/23 1418  BP: 137/73 (!) 148/90 (!) 152/72 (!) 142/81  Pulse: 93 99 88 89  Resp: 17 (!) 24 (!) 24   Temp: 98.4 F (36.9 C) 97.7 F (36.5 C) 98.6 F (37 C) 99 F (37.2 C)  TempSrc: Oral Oral Oral Oral  SpO2: 95% 95% 95% 96%  Weight:      Height:       General exam: Afebrile, no chest pain, no nausea or vomiting.  Clear urine appreciated in Foley catheter. Respiratory system: Clear to auscultation. Respiratory effort normal.  Good saturation on room air. Cardiovascular system:RRR. No rubs or  gallops; no  JVD. Gastrointestinal system: Abdomen is nondistended, soft and nontender. No organomegaly or masses felt. Normal bowel sounds heard. Central nervous system: No new focal neurological deficits. Extremities: No cyanosis or clubbing. Skin: No petechiae. Psychiatry: Mood & affect appropriate.   Data Reviewed: Basic metabolic panel: Sodium 136, potassium 3.4, chloride 103, bicarb 22, BUN 17, creatinine 0.56 and GFR >60 CBC: WBC 7.7, hemoglobin 9.4 and platelet count 419K Urine culture: ESBL E. coli appreciated Renal ultrasound: Complete resolution of bladder distention and hydronephrosis.  Foley catheter in place.  Family Communication: No family at bedside.  Disposition: Status is: Inpatient Remains inpatient appropriate because: IV therapy.   Planned Discharge Destination: Skilled nursing facility (long-term resident).  Time spent: 50 minutes  Author: Vassie Loll, MD 10/22/2023 3:48 PM  For on call review [email or web address].

## 2023-10-22 NOTE — Progress Notes (Signed)
 1100 cc yellow/ straw colored urine emptied during the night. No hematuria noted.

## 2023-10-23 DIAGNOSIS — D62 Acute posthemorrhagic anemia: Secondary | ICD-10-CM | POA: Diagnosis not present

## 2023-10-23 DIAGNOSIS — R339 Retention of urine, unspecified: Secondary | ICD-10-CM | POA: Diagnosis not present

## 2023-10-23 DIAGNOSIS — G40909 Epilepsy, unspecified, not intractable, without status epilepticus: Secondary | ICD-10-CM | POA: Diagnosis not present

## 2023-10-23 DIAGNOSIS — R310 Gross hematuria: Secondary | ICD-10-CM | POA: Diagnosis not present

## 2023-10-23 MED ORDER — CHLORHEXIDINE GLUCONATE CLOTH 2 % EX PADS
6.0000 | MEDICATED_PAD | Freq: Every day | CUTANEOUS | Status: DC
  Administered 2023-10-23 – 2023-10-24 (×2): 6 via TOPICAL

## 2023-10-23 NOTE — Progress Notes (Signed)
 Progress Note   Patient: Leah Payne EXB:[id number] DOB: [birth year] DOA: 10/20/2023     3 DOS: the patient was seen and examined on 10/23/2023   Brief hospital patient narrative: As per H&P written by Dr. Randol Kern on 10/20/2023 Leah Payne  is a 78 y.o. female,  with PMH of congenital hydrocephalus, seizure disorder, hematuria, overactive bladder, dementia, HTN, HLD, WPW syndrome , patient with known history of hematuria,  -He had hospitalization last September at [facility] for hematuria for which he underwent CT urogram which was suspicious for neoplastic disease and was treated with CBI and discharged on 9/13 (cystoscopy during that admission found bladder mass, which was biopsy, biopsy was significant for acute on chronic inflammation with no malignancy) discharged with Foley, she was readmitted again at [facility] for gross hematuria and hemorrhagic shock, resolved after blood transfusion, stabilized after blood transfusions and holding her aspirin and Plavix. -And with advanced dementia, congenital hydrocephalus unable to provide unreliable history, it was obtained from ED staff and previous records -Patient was sent from her facility for what thought to be a vaginal bleeding, was hypotensive with EMS, she is awake herself, denies any complaints, but cannot provide any good history, open evaluation in ED, it was noted to have gross hematuria not vaginal bleeding, CT abdomen pelvis was obtained which did show evidence of urinary retention with bilateral hydronephrosis, Foley catheter was inserted with total of 2350 cc output gross hematuria, most recent hemoglobin was 13 last September, it is 8.4 this admission, [facility] hospitalist consulted to admit.   Assessment and Plan: 1-blood loss anemia/hemorrhagic shock and gross hematuria -Bilateral hydronephrosis and distended bladder appreciated on images at time of admission -No kidney stones seen -Foley catheter placement along  with irrigation has been instructed by urology service.  Following urology recommendations plan is to discharge patient with Foley catheter in place. -Patient hemodynamically stable and reporting no nausea, no vomiting, no suprapubic pain and is afebrile. -Patient urine culture demonstrating ESBL; antibiotics transition to ertapenem. -Hemoglobin stable after blood transfusion -No further signs of hematuria currently appreciated; hemoglobin has remained stable. -Repeat renal ultrasound demonstrating complete resolution of bilateral hydronephrosis.  2-proctitis -Unclear if related in proximity to bladder distention -Continue antibiotic therapy.  3-history of congenital hydrocephalus/epilepsy -Continue Keppra -No seizure appreciated.  4-history of dementia with behavioral disturbance -Continue treatment with Aricept -Continue the use of Remeron and Zoloft -Stable mood appreciated.  5-history of CVA -Holding aspirin and Plavix in the setting of acute hematuria and blood loss anemia -Will follow clearance by urology service for resumption of secondary prevention.  6-hypothyroidism -Continue Synthroid  7-history of vitamin D deficiency -Continue calcium and vitamin D supplementation.  8-hyperlipidemia -Continue statin.   Subjective:  No further signs of hematuria appreciated; patient afebrile, no nausea, no vomiting, no chest pain.  Good saturation on room air appreciated.  Physical Exam: Vitals:   10/22/23 1418 10/22/23 2008 10/23/23 0437 10/23/23 1301  BP: (!) 142/81 (!) 140/69 (!) 143/71 (!) 142/85  Pulse: 89 88 83 95  Resp:      Temp: 99 F (37.2 C) 98.4 F (36.9 C) 98.6 F (37 C) 98.9 F (37.2 C)  TempSrc: Oral Oral Oral Oral  SpO2: 96% 95% 93% 93%  Weight:      Height:       General exam: No fever, no chest pain, no shortness of breath.  Clean urine inside Foley bag appreciated.  Patient without acute distress. Respiratory system: Good saturation on room  air. Cardiovascular system:RRR. No murmurs, rubs, gallops. Gastrointestinal system: Abdomen is nondistended, soft and nontender. No organomegaly or masses felt. Normal bowel sounds heard. Central nervous system: No focal neurological deficits. Extremities: No cyanosis or clubbing. Skin: No petechiae. Psychiatry: Mood and affect appropriate.  Latest data Reviewed: Basic metabolic panel: Sodium 136, potassium 3.4, chloride 103, bicarb 22, BUN 17, creatinine 0.56 and GFR >60 CBC: WBC 7.7, hemoglobin 9.4 and platelet count 419K Urine culture: ESBL E. coli appreciated Renal ultrasound: Complete resolution of bladder distention and hydronephrosis.  Foley catheter in place.  Family Communication: No family at bedside.  Disposition: Status is: Inpatient Remains inpatient appropriate because: IV therapy.   Planned Discharge Destination: Skilled nursing facility (long-term resident).  Time spent: 50 minutes  Author: Vassie Loll, MD 10/23/2023 7:06 PM  For on call review [email or web address].

## 2023-10-23 NOTE — Plan of Care (Signed)

## 2023-10-23 NOTE — Progress Notes (Signed)
 Pt currently has no wounds. Removed past documented wounds from the LDA.

## 2023-10-23 NOTE — Progress Notes (Signed)
 SLP Cancellation Note  Patient Details Name: Leah Payne MRN: [id number] DOB: 1945-08-24   Cancelled treatment:       Reason Eval/Treat Not Completed: Other (comment). Acknowledge SLE however, Pt has dementia at baseline and MD reports SLE is not indicated at this time for an acute change in cognition. Our service will sign off. Thank you for this referral,  Robynne Roat H. Romie Levee, CCC-SLP Speech Language Pathologist    Georgetta Haber 10/23/2023, 12:23 PM

## 2023-10-24 DIAGNOSIS — F0390 Unspecified dementia without behavioral disturbance: Secondary | ICD-10-CM | POA: Diagnosis not present

## 2023-10-24 DIAGNOSIS — G40909 Epilepsy, unspecified, not intractable, without status epilepticus: Secondary | ICD-10-CM | POA: Diagnosis not present

## 2023-10-24 DIAGNOSIS — I10 Essential (primary) hypertension: Secondary | ICD-10-CM

## 2023-10-24 DIAGNOSIS — R339 Retention of urine, unspecified: Secondary | ICD-10-CM | POA: Diagnosis not present

## 2023-10-24 LAB — CBC
HCT: 30.4 % — ABNORMAL LOW (ref 36.0–46.0)
Hemoglobin: 9.4 g/dL — ABNORMAL LOW (ref 12.0–15.0)
MCH: 26.7 pg (ref 26.0–34.0)
MCHC: 30.9 g/dL (ref 30.0–36.0)
MCV: 86.4 fL (ref 80.0–100.0)
Platelets: 454 10*3/uL — ABNORMAL HIGH (ref 150–400)
RBC: 3.52 MIL/uL — ABNORMAL LOW (ref 3.87–5.11)
RDW: 14.1 % (ref 11.5–15.5)
WBC: 6.7 10*3/uL (ref 4.0–10.5)
nRBC: 0 % (ref 0.0–0.2)

## 2023-10-24 LAB — BASIC METABOLIC PANEL WITH GFR
Anion gap: 10 (ref 5–15)
BUN: 18 mg/dL (ref 8–23)
CO2: 23 mmol/L (ref 22–32)
Calcium: 8.7 mg/dL — ABNORMAL LOW (ref 8.9–10.3)
Chloride: 107 mmol/L (ref 98–111)
Creatinine, Ser: 0.61 mg/dL (ref 0.44–1.00)
GFR, Estimated: >60 mL/min (ref >60)
Glucose, Bld: 76 mg/dL (ref 70–99)
Potassium: 3.5 mmol/L (ref 3.5–5.1)
Sodium: 140 mmol/L (ref 135–145)

## 2023-10-24 LAB — BPAM RBC
Blood Product Expiration Date: 202504072359
Blood Product Expiration Date: 202504132359
Unit Type and Rh: 6200
Unit Type and Rh: 6200

## 2023-10-24 LAB — TYPE AND SCREEN
ABO/RH(D): A POS
Antibody Screen: NEGATIVE
Unit division: 0
Unit division: 0

## 2023-10-24 MED ORDER — ORAL CARE MOUTH RINSE: 15.0000 mL | OROMUCOSAL | Status: DC | PRN

## 2023-10-24 MED ORDER — NITROFURANTOIN MONOHYD MACRO 100 MG PO CAPS
100.0000 mg | ORAL_CAPSULE | Freq: Two times a day (BID) | ORAL | 0 refills | Status: AC
Start: 2023-10-24 — End: 2023-11-03

## 2023-10-24 NOTE — Care Management Important Message (Signed)
 Important Message  Patient Details  Name: Leah Payne MRN: [id number] Date of Birth: 09-Jan-1946   Important Message Given:  N/A - LOS <3 / Initial given by admissions     Corey Harold 10/24/2023, 2:47 PM

## 2023-10-24 NOTE — Plan of Care (Signed)

## 2023-10-24 NOTE — TOC Transition Note (Signed)
 Transition of Care [facility]) - Discharge Note   Patient Details  Name: Leah Payne MRN: [id number] Date of Birth: 26-May-1946  [facility] CM/SW Contact:  Villa Herb, LCSWA Phone Number: 10/24/2023, 2:21 PM  Clinical Narrative:    CSW updated that pt is medically stable for D/C back to [facility] today. CSW spoke to Casimiro Needle with facility admissions who states they can accept pt back today. CSW sent D/C clinicals via HUB to facility. CSW provided RN with report number. CSW completed med necessity and sent to the floor for RN. EMS called for transport. TOC signing off.   Final next level of care: Long Term Nursing Home Barriers to Discharge: Barriers Resolved   Patient Goals and CMS Choice Patient states their goals for this hospitalization and ongoing recovery are:: Return to [facility]          Discharge Placement                Patient to be transferred to facility by: EMS   Patient and family notified of of transfer: 10/24/23  Discharge Plan and Services Additional resources added to the After Visit Summary for   In-house Referral: Clinical Social Work Discharge Planning Services: CM Consult Post Acute Care Choice: Skilled Nursing Facility                               Social Drivers of Health (SDOH) Interventions SDOH Screenings   Food Insecurity: No Food Insecurity (10/20/2023)  Housing: Low Risk  (10/20/2023)  Transportation Needs: No Transportation Needs (10/20/2023)  Utilities: Not At Risk (10/20/2023)  Alcohol Screen: Low Risk  (07/06/2021)  Depression (PHQ2-9): Low Risk  (07/06/2021)  Financial Resource Strain: Low Risk  (07/06/2021)  Physical Activity: Inactive (07/06/2021)  Social Connections: Moderately Isolated (10/20/2023)  Stress: No Stress Concern Present (07/06/2021)  Tobacco Use: Low Risk  (10/20/2023)     Readmission Risk Interventions    10/20/2023    8:27 PM 04/13/2023    5:45 PM  Readmission Risk  Prevention Plan  Transportation Screening Complete Complete  PCP or Specialist Appt within 5-7 Days Complete Complete  Home Care Screening Complete Complete  Medication Review (RN CM) Complete Complete

## 2023-10-24 NOTE — NC FL2 (Signed)
 [place] MEDICAID FL2 LEVEL OF CARE FORM     IDENTIFICATION  Patient Name: Leah Payne Birthdate: 01/04/1946 Sex: female Admission Date (Current Location): 10/20/2023  Outpatient Carecenter and [place] Number:  [employer] and Address:  [facility],  [address], [place] [address]      Provider Number: [id number]  Attending Physician Name and Address:  Vassie Loll, MD  Relative Name and Phone Number:       Current Level of Care: Hospital Recommended Level of Care: Nursing Facility Prior Approval Number:    Date Approved/Denied:   PASRR Number: [id number]  Discharge Plan: SNF    Current Diagnoses: Patient Active Problem List   Diagnosis Date Noted   Urinary retention 10/21/2023   Hematuria 10/20/2023   Hemorrhagic shock (HCC) 04/11/2023   Lactic acidosis 04/11/2023   Acute blood loss anemia 04/11/2023   Hyperglycemia 04/11/2023   Bladder mass 04/05/2023   Gross hematuria 04/04/2023   History of ischemic stroke 02/16/2023   Hypokalemia 02/15/2023   Gait instability 02/15/2023   Major neurocognitive disorder (HCC) 02/15/2023   Generalized weakness 02/14/2023   Asymmetrical hearing loss 02/25/2020   Vitamin D deficiency 03/21/2017   Dizziness 03/23/2016   Alzheimer disease (HCC) 12/17/2015   WPW (Wolff-Parkinson-White syndrome)    Type 2 diabetes, controlled, 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HCC) 09/17/2015   Hypothyroidism 09/17/2015   Debility    UTI (urinary tract infection) 08/29/2014   Overactive bladder 08/29/2014   Seizure disorder (HCC) 08/04/2013   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HCC) 08/04/2013   Essential hypertension 08/04/2013   Hyperlipidemia associated with type 2 diabetes mellitus (HCC) 08/04/2013    Orientation RESPIRATION BLADDER Height & Weight     Self, Place  Normal Incontinent, Indwelling catheter Weight: 110 lb (49.9 kg) Height:  5\' 2"  (157.5 cm)  BEHAVIORAL SYMPTOMS/MOOD NEUROLOGICAL BOWEL NUTRITION STATUS       Incontinent Diet (See D/C summary)  AMBULATORY STATUS COMMUNICATION OF NEEDS Skin   Extensive Assist Verbally Normal                       Personal Care Assistance Level of Assistance  Bathing, Feeding, Dressing Bathing Assistance: Maximum assistance Feeding assistance: Limited assistance Dressing Assistance: Maximum assistance     Functional Limitations Info  Sight, Hearing, Speech Sight Info: Impaired Hearing Info: Adequate Speech Info: Adequate    SPECIAL CARE FACTORS FREQUENCY                       Contractures Contractures Info: Not present    Additional Factors Info  Code Status, Allergies Code Status Info: DNR Allergies Info: NKA           Current Medications (10/24/2023):  This is the current hospital active medication list Current Facility-Administered Medications  Medication Dose Route Frequency Provider Last Rate Last Admin   0.9 %  sodium chloride infusion (Manually program via Guardrails IV Fluids)   Intravenous Once Elgergawy, Leana Roe, MD       acetaminophen (TYLENOL) tablet 650 mg  650 mg Oral Q6H PRN Elgergawy, Leana Roe, MD       Or   acetaminophen (TYLENOL) suppository 650 mg  650 mg Rectal Q6H PRN Elgergawy, Leana Roe, MD       atorvastatin (LIPITOR) tablet 40 mg  40 mg Oral Daily Elgergawy, Leana Roe, MD   40 mg at 10/24/23 0930   calcium carbonate (OS-CAL - dosed in mg of elemental calcium) tablet 1,250 mg  1 tablet Oral Q breakfast Elgergawy, Leana Roe, MD   1,250 mg at 10/24/23 0756   Chlorhexidine Gluconate Cloth 2 % PADS 6 each  6 each Topical Q0600 Vassie Loll, MD   6 each at 10/24/23 4782   cholecalciferol (VITAMIN D3) 25 MCG (1000 UNIT) tablet 2,000 Units  2,000 Units Oral Daily Elgergawy, Leana Roe, MD   2,000 Units at 10/24/23 0931   donepezil (ARICEPT) tablet 10 mg  10 mg Oral QHS Elgergawy, Leana Roe, MD   10 mg at 10/23/23 2136   ertapenem [facility]) 1 g in sodium chloride 0.9 % 100 mL IVPB  1 g Intravenous Q24H Vassie Loll, MD  200 mL/hr at 10/24/23 1338 1 g at 10/24/23 1338   fesoterodine ([name]) tablet 8 mg  8 mg Oral Daily Elgergawy, Leana Roe, MD   8 mg at 10/24/23 0932   levETIRAcetam (KEPPRA) tablet 750 mg  750 mg Oral BID Elgergawy, Leana Roe, MD   750 mg at 10/24/23 0930   levothyroxine (SYNTHROID) tablet 50 mcg  50 mcg Oral Daily Elgergawy, Leana Roe, MD   50 mcg at 10/24/23 9562   magnesium oxide (MAG-OX) tablet 400 mg  400 mg Oral Daily Elgergawy, Leana Roe, MD   400 mg at 10/24/23 0930   melatonin tablet 3 mg  3 mg Oral QHS Elgergawy, Leana Roe, MD   3 mg at 10/23/23 2136   mirabegron ER ([name]) tablet 50 mg  50 mg Oral QHS Elgergawy, Leana Roe, MD   50 mg at 10/23/23 2136   mirtazapine (REMERON) tablet 15 mg  15 mg Oral QHS Elgergawy, Leana Roe, MD   15 mg at 10/23/23 2137   Oral care mouth rinse  15 mL Mouth Rinse PRN Vassie Loll, MD       potassium chloride SA (KLOR-CON M) CR tablet 20 mEq  20 mEq Oral Daily Elgergawy, Leana Roe, MD   20 mEq at 10/24/23 0930   sertraline (ZOLOFT) tablet 50 mg  50 mg Oral Daily Elgergawy, Leana Roe, MD   50 mg at 10/24/23 0930     Discharge Medications: Please see discharge summary for a list of discharge medications.  Relevant Imaging Results:  Relevant Lab Results:   Additional Information SSN: [id number], [place]

## 2023-10-24 NOTE — Discharge Summary (Signed)
 Physician Discharge Summary   Patient: Leah Payne MRN: [id number] DOB: 25-Dec-1945  Admit date:     10/20/2023  Discharge date: 10/24/23  Discharge Physician: Vassie Loll   PCP: Lindaann Pascal   Recommendations at discharge:  Repeat basic metabolic panel to follow ultralights renal function Repeat CBC to assess hemoglobin trend/stability Make sure patient follow-up with urology service for voiding trials and further decisions regarding Foley catheter removal.  Discharge Diagnoses: Principal Problem:   Hematuria Active Problems:   Seizure disorder (HCC)   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HCC)   Essential hypertension   Debility   Alzheimer disease (HCC)   Major neurocognitive disorder (HCC)   Hemorrhagic shock (HCC)   Gross hematuria   Acute blood loss anemia   Urinary retention  Brief hospital patient narrative: As per H&P written by Dr. Randol Kern on 10/20/2023 Leah Payne  is a 78 y.o. female,  with PMH of congenital hydrocephalus, seizure disorder, hematuria, overactive bladder, dementia, HTN, HLD, WPW syndrome , patient with known history of hematuria,  -He had hospitalization last September at [facility] for hematuria for which he underwent CT urogram which was suspicious for neoplastic disease and was treated with CBI and discharged on 9/13 (cystoscopy during that admission found bladder mass, which was biopsy, biopsy was significant for acute on chronic inflammation with no malignancy) discharged with Foley, she was readmitted again at [facility] for gross hematuria and hemorrhagic shock, resolved after blood transfusion, stabilized after blood transfusions and holding her aspirin and Plavix. -And with advanced dementia, congenital hydrocephalus unable to provide unreliable history, it was obtained from ED staff and previous records -Patient was sent from her facility for what thought to be a vaginal bleeding, was hypotensive with EMS, she is awake herself,  denies any complaints, but cannot provide any good history, open evaluation in ED, it was noted to have gross hematuria not vaginal bleeding, CT abdomen pelvis was obtained which did show evidence of urinary retention with bilateral hydronephrosis, Foley catheter was inserted with total of 2350 cc output gross hematuria, most recent hemoglobin was 13 last September, it is 8.4 this admission, [facility] hospitalist consulted to admit.  Assessment and Plan: 1-blood loss anemia/hemorrhagic shock and gross hematuria -Bilateral hydronephrosis and distended bladder appreciated on images at time of admission -No kidney stones seen -Foley catheter placement along with irrigation has been instructed by urology service.  Following urology recommendations plan is to discharge patient with Foley catheter in place. -Patient hemodynamically stable and reporting no nausea, no vomiting, no suprapubic pain and is afebrile. -Patient urine culture demonstrating ESBL; antibiotics transition to ertapenem; after 3 days of IV ertapenem patient transition to nitrofurantoin following sensitivity results for 10 more days of oral antibiotics to complete therapy. -No further signs of hematuria currently appreciated; hemoglobin has remained stable since transfusion. -Repeat renal ultrasound demonstrating complete resolution of bilateral hydronephrosis.   2-proctitis -Unclear if related in proximity to bladder distention -Patient will complete antibiotic therapy by mouth as prescribed. -Continue as needed stool softener.   3-history of congenital hydrocephalus/epilepsy -Continue Keppra -No seizure appreciated.   4-history of dementia with behavioral disturbance -Continue treatment with Aricept -Continue the use of Remeron and Zoloft -Stable mood appreciated; no agitation or hallucinations..   5-history of CVA -Initial LA holding aspirin and Plavix in the setting of acute hematuria and blood loss anemia -Safe to resume the  use of aspirin and Plavix at time of discharge.   6-hypothyroidism -Continue Synthroid   7-history of vitamin D  deficiency -Continue calcium and vitamin D supplementation.   8-hyperlipidemia -Continue statin.  Consultants: Urology service Procedures performed: See below for x-ray reports. Disposition: Skilled nursing facility long-term resident Diet recommendation: Heart healthy diet.  DISCHARGE MEDICATION: Allergies as of 10/24/2023   No Known Allergies      Medication List     TAKE these medications    acetaminophen 650 MG CR tablet Commonly known as: TYLENOL Take 650 mg by mouth every 8 (eight) hours as needed for pain.   atorvastatin 40 MG tablet Commonly known as: LIPITOR TAKE (1) TABLET BY MOUTH EVERY EVENING.   calcium carbonate 1500 (600 Ca) MG Tabs tablet Commonly known as: OSCAL Take 600 mg of elemental calcium by mouth daily with breakfast.   Calmoseptine 0.44-20.6 % Oint Generic drug: Menthol-Zinc Oxide Apply 1 Application topically in the morning, at noon, and at bedtime. Apply to buttocks and groin + every 4 hours prn for moisture associated skin damage   donepezil 10 MG tablet Commonly known as: ARICEPT TAKE (1) TABLET BY MOUTH EVERY MORNING. What changed: See the new instructions.   lactose free nutrition Liqd Take 240 mLs by mouth 3 (three) times daily between meals.   levETIRAcetam 750 MG tablet Commonly known as: KEPPRA Take 1 tablet (750 mg total) by mouth 2 (two) times daily.   levothyroxine 50 MCG tablet Commonly known as: SYNTHROID Take 50 mcg by mouth daily.   loperamide 2 MG tablet Commonly known as: IMODIUM A-D Take 2-4 mg by mouth as needed for diarrhea or loose stools. Do not exceed 4 tablets in 24 hour period. If diarrhea persists longer than 24 hours contact MD   magnesium oxide 400 (240 Mg) MG tablet Commonly known as: MAG-OX Take 400 mg by mouth daily.   melatonin 5 MG Tabs Take 5 mg by mouth at bedtime.    mirtazapine 15 MG tablet Commonly known as: REMERON Take 15 mg by mouth at bedtime.   [name] 50 MG Tb24 tablet Generic drug: mirabegron ER TAKE (1) TABLET BY MOUTH AT BEDTIME. What changed: See the new instructions.   nitrofurantoin (macrocrystal-monohydrate) 100 MG capsule Commonly known as: Macrobid Take 1 capsule (100 mg total) by mouth 2 (two) times daily for 10 days.   potassium chloride SA 20 MEQ tablet Commonly known as: KLOR-CON M Take 20 mEq by mouth daily.   Pro-Stat Liqd Take 30 mLs by mouth in the morning and at bedtime.   sertraline 50 MG tablet Commonly known as: ZOLOFT Take 50 mg by mouth daily.   [name] 8 MG Tb24 tablet Generic drug: fesoterodine TAKE (1) TABLET BY MOUTH ONCE DAILY.   Vitamin D 50 MCG (2000 UT) Caps Take 1 capsule (2,000 Units total) by mouth daily.        Follow-up Information     [place], [place]. Schedule an appointment as soon as possible for a visit in 10 day(s).   Specialty: Skilled Nursing Facility Contact information: [address] [place] [address] [phone]         Malen Gauze, MD. Schedule an appointment as soon as possible for a visit in 1 week(s).   Specialty: Urology Contact information: [address]  [place] [address] [phone]                Discharge Exam: Ceasar Mons Weights   10/20/23 1118  Weight: 49.9 kg   General exam: No fever, no chest pain, no shortness of breath.  Clean urine inside Foley bag appreciated.  Patient without acute distress. Respiratory system: Good saturation on room air. Cardiovascular system:RRR. No murmurs, rubs, gallops. Gastrointestinal system: Abdomen is nondistended, soft and nontender. No organomegaly or masses felt. Normal bowel sounds heard. Central nervous system: No focal neurological deficits. Extremities: No cyanosis or clubbing. Skin: No petechiae. Psychiatry: Mood and affect appropriate.  Condition at discharge:  Stable.  The results of significant diagnostics from this hospitalization (including imaging, microbiology, ancillary and laboratory) are listed below for reference.   Imaging Studies: US RENAL Result Date: 10/22/2023 CLINICAL DATA:  6300 Hydronephrosis 6300 EXAM: RENAL / URINARY TRACT ULTRASOUND COMPLETE COMPARISON:  October 20, 2023 FINDINGS: Right Kidney: Renal measurements: 10.8 x 4.0 x 5.0 cm = volume: 113 mL. Echogenicity within normal limits. No mass or hydronephrosis visualized. Left Kidney: Renal measurements: 10.2 x 5.2 x 5.1 cm = volume: 146 mL. Echogenicity within normal limits. No mass or hydronephrosis visualized. Bladder: Decompressed around a Foley catheter Other: None. IMPRESSION: Resolution of hydronephrosis status post bladder decompression. Electronically Signed   By: Meda Klinefelter M.D.   On: 10/22/2023 15:00   CT Renal Stone Study Result Date: 10/20/2023 CLINICAL DATA:  gross hematuria. EXAM: CT ABDOMEN AND PELVIS WITHOUT CONTRAST TECHNIQUE: Multidetector CT imaging of the abdomen and pelvis was performed following the standard protocol without IV contrast. RADIATION DOSE REDUCTION: This exam was performed according to the departmental dose-optimization program which includes automated exposure control, adjustment of the mA and/or kV according to patient size and/or use of iterative reconstruction technique. COMPARISON:  CT scan abdomen and pelvis from 05/17/2019. FINDINGS: Lower chest: There are atelectatic changes at the lung bases. No dense consolidation or lung mass. No pleural effusion. The heart is normal in size. No pericardial effusion. Hepatobiliary: The liver is normal in size. Non-cirrhotic configuration. No suspicious mass. No intrahepatic or extrahepatic bile duct dilation. Gallbladder is surgically absent. Pancreas: Unremarkable. No pancreatic ductal dilatation or surrounding inflammatory changes. Spleen: Within normal limits. No focal lesion. Adrenals/Urinary Tract:  Adrenal glands are unremarkable. No suspicious renal mass within the limitations of this unenhanced exam. Bilateral extrarenal pelves noted. There is bilateral mild-to-moderate hydronephrosis and hydroureter. No nephroureterolithiasis on either side. Markedly distended urinary bladder. No focal mass or bladder calculi. Stomach/Bowel: There is a small-to-moderate sliding hiatal hernia. There is a small diverticulum arising from the third part of duodenum no disproportionate dilation of the small or large bowel loops. The appendix was not visualized; however there is no acute inflammatory process in the right lower quadrant. The rectum is dilated measuring up to 9 cm in diameter and exhibit mild circumferential wall thickening. There is mild perirectal fat stranding. In appropriate clinical settings findings favor proctitis. Vascular/Lymphatic: No ascites or pneumoperitoneum. No abdominal or pelvic lymphadenopathy, by size criteria. No aneurysmal dilation of the major abdominal arteries. There are moderate peripheral atherosclerotic vascular calcifications of the aorta and its major branches. Reproductive: The uterus is unremarkable. No large adnexal mass. Other: There is a tiny fat containing umbilical hernia. Mild anasarca. The soft tissues and abdominal wall are otherwise unremarkable. Musculoskeletal: No suspicious osseous lesions. There are mild - moderate multilevel degenerative changes in the visualized spine. IMPRESSION: 1. Markedly distended urinary bladder with resultant mild-to-moderate bilateral hydronephrosis and hydroureter. No nephroureterolithiasis on either side. 2. Dilated rectum with mild circumferential wall thickening and perirectal fat stranding. In appropriate clinical settings findings favor proctitis. 3. Multiple other nonacute observations, as described above. Aortic Atherosclerosis (ICD10-I70.0). Electronically Signed   By: Jules Schick M.D.   On: 10/20/2023 14:56  Microbiology: Results for orders placed or performed during the hospital encounter of 10/20/23  Urine Culture     Status: Abnormal   Collection Time: 10/20/23 11:37 AM   Specimen: Urine, Random  Result Value Ref Range Status   Specimen Description   Final    URINE, RANDOM Performed at [facility], [address] [place], [place] [address]    Special Requests   Final    NONE Reflexed from [id number] Performed at [facility], [address] [place], [place] [address]    Culture (A)  Final    >=100,000 COLONIES/mL ESCHERICHIA COLI Confirmed Extended Spectrum Beta-Lactamase Producer (ESBL).  In bloodstream infections from ESBL organisms, carbapenems are preferred over piperacillin/tazobactam. They are shown to have a lower risk of mortality.    Report Status 10/22/2023 FINAL  Final   Organism ID, Bacteria ESCHERICHIA COLI (A)  Final      Susceptibility   Escherichia coli - MIC*    AMPICILLIN >=32 RESISTANT Resistant     CEFAZOLIN >=64 RESISTANT Resistant     CEFEPIME >=32 RESISTANT Resistant     CEFTRIAXONE >=64 RESISTANT Resistant     CIPROFLOXACIN 0.5 INTERMEDIATE Intermediate     GENTAMICIN <=1 SENSITIVE Sensitive     IMIPENEM <=0.25 SENSITIVE Sensitive     NITROFURANTOIN <=16 SENSITIVE Sensitive     TRIMETH/SULFA >=320 RESISTANT Resistant     AMPICILLIN/SULBACTAM >=32 RESISTANT Resistant     PIP/TAZO 16 SENSITIVE Sensitive ug/mL    * >=100,000 COLONIES/mL ESCHERICHIA COLI    Labs: CBC: Recent Labs  Lab 10/20/23 1159 10/20/23 1550 10/20/23 2108 10/21/23 0403 10/22/23 0424 10/24/23 0417  WBC 6.4 5.9 7.3 6.6 7.7 6.7  NEUTROABS 4.3  --   --   --   --   --   HGB 8.4* 8.5* 8.8* 8.9* 9.4* 9.4*  HCT 26.4* 27.3* 27.7* 28.4* 28.8* 30.4*  MCV 86.8 87.8 87.1 86.9 84.5 86.4  PLT 252 255 309 322 419* 454*   Basic Metabolic Panel: Recent Labs  Lab 10/20/23 1159 10/21/23 0403 10/22/23 0424 10/24/23 0417  NA 136 140 136 140  K 4.3 3.9 3.4* 3.5  CL 102 109 103 107   CO2 27 23 22 23   GLUCOSE 127* 83 120* 76  BUN 27* 17 17 18   CREATININE 0.72 0.58 0.56 0.61  CALCIUM 8.6* 8.7* 8.6* 8.7*    Discharge time spent: greater than 30 minutes.  Signed: Vassie Loll, MD [facility] 10/24/2023

## 2023-10-25 DIAGNOSIS — N390 Urinary tract infection, site not specified: Secondary | ICD-10-CM | POA: Diagnosis not present

## 2023-10-25 DIAGNOSIS — N139 Obstructive and reflux uropathy, unspecified: Secondary | ICD-10-CM | POA: Diagnosis not present

## 2023-10-25 DIAGNOSIS — M19042 Primary osteoarthritis, left hand: Secondary | ICD-10-CM | POA: Diagnosis not present

## 2023-10-25 DIAGNOSIS — E119 Type 2 diabetes mellitus without complications: Secondary | ICD-10-CM | POA: Diagnosis not present

## 2023-10-25 DIAGNOSIS — G919 Hydrocephalus, unspecified: Secondary | ICD-10-CM | POA: Diagnosis not present

## 2023-10-25 DIAGNOSIS — R310 Gross hematuria: Secondary | ICD-10-CM | POA: Diagnosis not present

## 2023-10-25 DIAGNOSIS — D62 Acute posthemorrhagic anemia: Secondary | ICD-10-CM | POA: Diagnosis not present

## 2023-10-25 DIAGNOSIS — I6350 Cerebral infarction due to unspecified occlusion or stenosis of unspecified cerebral artery: Secondary | ICD-10-CM | POA: Diagnosis not present

## 2023-10-25 DIAGNOSIS — Z978 Presence of other specified devices: Secondary | ICD-10-CM | POA: Diagnosis not present

## 2023-10-25 DIAGNOSIS — M24542 Contracture, left hand: Secondary | ICD-10-CM | POA: Diagnosis not present

## 2023-10-26 DIAGNOSIS — K6289 Other specified diseases of anus and rectum: Secondary | ICD-10-CM | POA: Diagnosis not present

## 2023-10-26 DIAGNOSIS — D62 Acute posthemorrhagic anemia: Secondary | ICD-10-CM | POA: Diagnosis not present

## 2023-10-26 DIAGNOSIS — N390 Urinary tract infection, site not specified: Secondary | ICD-10-CM | POA: Diagnosis not present

## 2023-10-26 DIAGNOSIS — Z79899 Other long term (current) drug therapy: Secondary | ICD-10-CM | POA: Diagnosis not present

## 2023-10-26 DIAGNOSIS — N3281 Overactive bladder: Secondary | ICD-10-CM | POA: Diagnosis not present

## 2023-10-26 DIAGNOSIS — N139 Obstructive and reflux uropathy, unspecified: Secondary | ICD-10-CM | POA: Diagnosis not present

## 2023-10-26 DIAGNOSIS — R310 Gross hematuria: Secondary | ICD-10-CM | POA: Diagnosis not present

## 2023-11-15 ENCOUNTER — Ambulatory Visit (INDEPENDENT_AMBULATORY_CARE_PROVIDER_SITE_OTHER): Admitting: Urology

## 2023-11-15 ENCOUNTER — Encounter: Payer: Self-pay | Admitting: Urology

## 2023-11-15 VITALS — BP 128/84 | HR 85

## 2023-11-15 DIAGNOSIS — R339 Retention of urine, unspecified: Secondary | ICD-10-CM | POA: Diagnosis not present

## 2023-11-15 NOTE — Progress Notes (Signed)
 11/15/2023 9:30 AM   Leah Payne 05/09/1946 [id number]  Referring provider: Oswald Bless [address] [place],  [place] [address]  Urinary retention  HPI: Leah Payne is a 77yo here for followup for urinary retention. She was hospitalized at the end of March for gross hematuria and urinary retention with bilateral hydronephrosis. Hematuria resolved 2-3 days after foley placement. Urine culture at that time grew E coli. She is a poor historian and does not recall having issues urinating prior to hospitalization. She is currently residing at [facility].    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 If you have any questions, ask your nurse or doctor.          acetaminophen  650 MG CR tablet Commonly known as: TYLENOL  Take 650 mg by mouth every 8 (eight) hours as needed for pain.   atorvastatin  40 MG tablet Commonly known as: LIPITOR TAKE (1) TABLET BY MOUTH EVERY  EVENING.   calcium  carbonate 1500 (600 Ca) MG Tabs tablet Commonly known as: OSCAL Take 600 mg of elemental calcium  by mouth daily with breakfast.   Calmoseptine 0.44-20.6 % Oint Generic drug: Menthol-Zinc  Oxide Apply 1 Application topically in the morning, at noon, and at bedtime. Apply to buttocks and groin + every 4 hours prn for moisture associated skin damage   donepezil  10 MG tablet Commonly known as: ARICEPT  TAKE (1) TABLET BY MOUTH EVERY MORNING. What changed: See the new instructions.   lactose free nutrition Liqd Take 240 mLs by mouth 3 (three) times daily between meals.   levETIRAcetam  750 MG tablet Commonly known as: KEPPRA  Take 1 tablet (750 mg total) by mouth 2 (two) times daily.   levothyroxine  50 MCG tablet Commonly known as: SYNTHROID  Take 50 mcg by mouth daily.   loperamide  2 MG tablet Commonly known as: IMODIUM  A-D Take 2-4 mg by mouth as needed for diarrhea or loose stools. Do not exceed 4 tablets in 24 hour period. If diarrhea persists longer than 24 hours contact MD   magnesium  oxide 400 (240 Mg) MG tablet Commonly known as: MAG-OX Take 400 mg by mouth daily.   melatonin 5 MG Tabs Take 5 mg by mouth at bedtime.   mirtazapine  15 MG tablet Commonly known as: REMERON  Take 15 mg by mouth at bedtime.   [name]  50 MG Tb24 tablet Generic drug: mirabegron  ER TAKE (1)  TABLET BY MOUTH AT BEDTIME. What changed: See the new instructions.   potassium chloride  SA 20 MEQ tablet Commonly known as: KLOR-CON  M Take 20 mEq by mouth daily.   Pro-Stat Liqd Take 30 mLs by mouth in the morning and at bedtime.   sertraline  50 MG tablet Commonly known as: ZOLOFT  Take 50 mg by mouth daily.   [name]  8 MG Tb24 tablet Generic drug: fesoterodine  TAKE (1) TABLET BY MOUTH ONCE DAILY.   Vitamin D  50 MCG (2000 UT) Caps Take 1 capsule (2,000 Units total) by mouth daily.        Allergies: No Known Allergies  Family History: Family History  Problem Relation  Age of Onset   Stroke Mother 58   Pneumonia Mother    CAD Father 15   Alzheimer's disease Father    Pneumonia Father    Cancer Maternal Aunt        lung (non-smoker)   Diabetes Neg Hx     Social History:  reports that she has never smoked. She has never used smokeless tobacco. She reports that she does not drink alcohol and does not use drugs.  ROS: All other review of systems were reviewed and are negative except what is noted above in HPI  Physical Exam: BP 128/84   Pulse 85   Constitutional:  Alert and oriented, No acute distress. HEENT: [place] AT, moist mucus membranes.  Trachea midline, no masses. Cardiovascular: No clubbing, cyanosis, or edema. Respiratory: Normal respiratory effort, no increased work of breathing. GI: Abdomen is soft, nontender, nondistended, no abdominal masses GU: No CVA tenderness.  Lymph: No cervical or inguinal lymphadenopathy. Skin: No rashes, bruises or suspicious lesions. Neurologic: Grossly intact, no focal deficits, moving all 4 extremities. Psychiatric: Normal mood and affect.  Laboratory Data: Lab Results  Component Value Date   WBC 6.7 10/24/2023   HGB 9.4 (L) 10/24/2023   HCT 30.4 (L) 10/24/2023   MCV 86.4 10/24/2023   PLT 454 (H) 10/24/2023    Lab Results  Component Value Date   CREATININE 0.61 10/24/2023    No results found for: "PSA"  No results found for: "TESTOSTERONE "  Lab Results  Component Value Date   HGBA1C 5.6 02/15/2023    Urinalysis    Component Value Date/Time   COLORURINE AMBER (A) 10/20/2023 1137   APPEARANCEUR CLOUDY (A) 10/20/2023 1137   LABSPEC 1.013 10/20/2023 1137   PHURINE 7.0 10/20/2023 1137   GLUCOSEU NEGATIVE 10/20/2023 1137   HGBUR MODERATE (A) 10/20/2023 1137   BILIRUBINUR NEGATIVE 10/20/2023 1137   BILIRUBINUR neg 03/07/2018 1020   KETONESUR NEGATIVE 10/20/2023 1137   PROTEINUR 100 (A) 10/20/2023 1137   UROBILINOGEN 0.2 03/07/2018 1020   UROBILINOGEN 1.0 08/29/2014 1206   NITRITE  NEGATIVE 10/20/2023 1137   LEUKOCYTESUR MODERATE (A) 10/20/2023 1137    Lab Results  Component Value Date   BACTERIA RARE (A) 10/20/2023    Pertinent Imaging:  No results found for this or any previous visit.  No results found for this or any previous visit.  No results found for this or any previous visit.  No results found for this or any previous visit.  Results for orders placed during the hospital encounter of 10/20/23  US  RENAL  Narrative CLINICAL DATA:  6300 Hydronephrosis 6300  EXAM: RENAL / URINARY TRACT ULTRASOUND COMPLETE  COMPARISON:  October 20, 2023  FINDINGS: Right Kidney:  Renal measurements: 10.8 x 4.0 x 5.0 cm = volume: 113 mL. Echogenicity within normal limits. No mass or hydronephrosis  visualized.  Left Kidney:  Renal measurements: 10.2 x 5.2 x 5.1 cm = volume: 146 mL. Echogenicity within normal limits. No mass or hydronephrosis visualized.  Bladder:  Decompressed around a Foley catheter  Other:  None.  IMPRESSION: Resolution of hydronephrosis status post bladder decompression.   Electronically Signed By: Clancy Crimes M.D. On: 10/22/2023 15:00  No results found for this or any previous visit.  No results found for this or any previous visit.  Results for orders placed during the hospital encounter of 10/20/23  CT Renal Stone Study  Narrative CLINICAL DATA:  gross hematuria.  EXAM: CT ABDOMEN AND PELVIS WITHOUT CONTRAST  TECHNIQUE: Multidetector CT imaging of the abdomen and pelvis was performed following the standard protocol without IV contrast.  RADIATION DOSE REDUCTION: This exam was performed according to the departmental dose-optimization program which includes automated exposure control, adjustment of the mA and/or kV according to patient size and/or use of iterative reconstruction technique.  COMPARISON:  CT scan abdomen and pelvis from 05/17/2019.  FINDINGS: Lower chest: There are atelectatic changes  at the lung bases. No dense consolidation or lung mass. No pleural effusion. The heart is normal in size. No pericardial effusion.  Hepatobiliary: The liver is normal in size. Non-cirrhotic configuration. No suspicious mass. No intrahepatic or extrahepatic bile duct dilation. Gallbladder is surgically absent.  Pancreas: Unremarkable. No pancreatic ductal dilatation or surrounding inflammatory changes.  Spleen: Within normal limits. No focal lesion.  Adrenals/Urinary Tract: Adrenal glands are unremarkable. No suspicious renal mass within the limitations of this unenhanced exam. Bilateral extrarenal pelves noted. There is bilateral mild-to-moderate hydronephrosis and hydroureter. No nephroureterolithiasis on either side. Markedly distended urinary bladder. No focal mass or bladder calculi.  Stomach/Bowel: There is a small-to-moderate sliding hiatal hernia. There is a small diverticulum arising from the third part of duodenum no disproportionate dilation of the small or large bowel loops. The appendix was not visualized; however there is no acute inflammatory process in the right lower quadrant. The rectum is dilated measuring up to 9 cm in diameter and exhibit mild circumferential wall thickening. There is mild perirectal fat stranding. In appropriate clinical settings findings favor proctitis.  Vascular/Lymphatic: No ascites or pneumoperitoneum. No abdominal or pelvic lymphadenopathy, by size criteria. No aneurysmal dilation of the major abdominal arteries. There are moderate peripheral atherosclerotic vascular calcifications of the aorta and its major branches.  Reproductive: The uterus is unremarkable. No large adnexal mass.  Other: There is a tiny fat containing umbilical hernia. Mild anasarca. The soft tissues and abdominal wall are otherwise unremarkable.  Musculoskeletal: No suspicious osseous lesions. There are mild - moderate multilevel degenerative changes in the  visualized spine.  IMPRESSION: 1. Markedly distended urinary bladder with resultant mild-to-moderate bilateral hydronephrosis and hydroureter. No nephroureterolithiasis on either side. 2. Dilated rectum with mild circumferential wall thickening and perirectal fat stranding. In appropriate clinical settings findings favor proctitis. 3. Multiple other nonacute observations, as described above.  Aortic Atherosclerosis (ICD10-I70.0).   Electronically Signed By: Beula Brunswick M.D. On: 10/20/2023 14:56   Assessment & Plan:    1. Urinary retention (Primary) Continue foley catheter and change monthly. Followup 6 months.   No follow-ups on file.  Johnie Nailer, MD  [facility] [place]

## 2023-11-15 NOTE — Patient Instructions (Signed)
 Trouble Peeing (Acute Urinary Retention) in Females: What to Know  Acute urinary retention is when a person can't pee at all or can only pee a little. This can come on all of a sudden. If it's not treated, it can lead to kidney problems or other serious problems. What are the causes? Acute urinary retention may be caused by: A problem with the urethra. This is the tube that drains pee from the bladder. Problems with the nerves in the bladder. The organs in the area between your hip bones (pelvis) shifting out of place. Tumors. The birth of a baby through the vagina. An infection. Having trouble pooping (constipation). Some medicines. What increases the risk? Females over age 16 are more at risk. Other health problems can also raise the risk. These include: Multiple sclerosis. Injury to the spinal cord. Diabetes. A condition that affects the way the brain works, such as dementia. Mental health problems. Having urinary retention before. Having had surgery in your pelvis area. What are the signs or symptoms? Trouble peeing. Pain in the lower belly. How is this treated? Treatment may include: Medicines. Treatment for conditions that may cause this. Placing a soft tube called a catheter into the bladder to drain pee out of the body. Therapy to treat mental health problems. If needed, you may be treated in the hospital for kidney problems. Follow these instructions at home: Medicines Take medicines only as told. Do not take any medicine unless your doctor says it's OK. If you were given antibiotics, take them as told. Do not stop taking them even if you start to feel better. General instructions Do not smoke, vape, or use nicotine or tobacco. Drink more fluids as told. If you need to use a catheter, follow the instructions closely. This will help prevent a bladder infection. If told, keep track of changes in your blood pressure at home. Tell your doctor about them. Contact a doctor  if: You have bladder cramping, called spasms. You leak pee when you have spasms. You have a fever or chills. You have blood in your pee. Get help right away if: You can't pee. This information is not intended to replace advice given to you by your health care provider. Make sure you discuss any questions you have with your health care provider. Document Revised: 03/09/2023 Document Reviewed: 03/09/2023 Elsevier Patient Education  2024 [employer].

## 2023-11-20 DIAGNOSIS — R627 Adult failure to thrive: Secondary | ICD-10-CM | POA: Diagnosis not present

## 2023-11-20 DIAGNOSIS — Z79899 Other long term (current) drug therapy: Secondary | ICD-10-CM | POA: Diagnosis not present

## 2023-12-07 DIAGNOSIS — Z23 Encounter for immunization: Secondary | ICD-10-CM | POA: Diagnosis not present

## 2023-12-18 DIAGNOSIS — E785 Hyperlipidemia, unspecified: Secondary | ICD-10-CM | POA: Diagnosis not present

## 2023-12-18 DIAGNOSIS — G309 Alzheimer's disease, unspecified: Secondary | ICD-10-CM | POA: Diagnosis not present

## 2023-12-18 DIAGNOSIS — N3281 Overactive bladder: Secondary | ICD-10-CM | POA: Diagnosis not present

## 2023-12-18 DIAGNOSIS — Z79899 Other long term (current) drug therapy: Secondary | ICD-10-CM | POA: Diagnosis not present

## 2024-01-10 ENCOUNTER — Telehealth: Payer: Self-pay | Admitting: Pharmacist

## 2024-01-10 NOTE — Telephone Encounter (Signed)
   This patient is appearing on a report for being at risk of failing the adherence measure for cholesterol (statin) medications this calendar year.    Patient now resides at [facility]   Marvell Slider, PharmD, BCACP, CPP Clinical Pharmacist, [facility]

## 2024-01-12 DIAGNOSIS — I739 Peripheral vascular disease, unspecified: Secondary | ICD-10-CM | POA: Diagnosis not present

## 2024-01-12 DIAGNOSIS — E1151 Type 2 diabetes mellitus with diabetic peripheral angiopathy without gangrene: Secondary | ICD-10-CM | POA: Diagnosis not present

## 2024-01-12 DIAGNOSIS — L603 Nail dystrophy: Secondary | ICD-10-CM | POA: Diagnosis not present

## 2024-01-16 DIAGNOSIS — R1312 Dysphagia, oropharyngeal phase: Secondary | ICD-10-CM | POA: Diagnosis not present

## 2024-01-16 DIAGNOSIS — M79642 Pain in left hand: Secondary | ICD-10-CM | POA: Diagnosis not present

## 2024-01-16 DIAGNOSIS — M6249 Contracture of muscle, multiple sites: Secondary | ICD-10-CM | POA: Diagnosis not present

## 2024-01-17 DIAGNOSIS — E119 Type 2 diabetes mellitus without complications: Secondary | ICD-10-CM | POA: Diagnosis not present

## 2024-01-17 DIAGNOSIS — M6249 Contracture of muscle, multiple sites: Secondary | ICD-10-CM | POA: Diagnosis not present

## 2024-01-17 DIAGNOSIS — E785 Hyperlipidemia, unspecified: Secondary | ICD-10-CM | POA: Diagnosis not present

## 2024-01-17 DIAGNOSIS — G919 Hydrocephalus, unspecified: Secondary | ICD-10-CM | POA: Diagnosis not present

## 2024-01-17 DIAGNOSIS — R1312 Dysphagia, oropharyngeal phase: Secondary | ICD-10-CM | POA: Diagnosis not present

## 2024-01-17 DIAGNOSIS — M19042 Primary osteoarthritis, left hand: Secondary | ICD-10-CM | POA: Diagnosis not present

## 2024-01-17 DIAGNOSIS — Z978 Presence of other specified devices: Secondary | ICD-10-CM | POA: Diagnosis not present

## 2024-01-17 DIAGNOSIS — M79642 Pain in left hand: Secondary | ICD-10-CM | POA: Diagnosis not present

## 2024-01-17 DIAGNOSIS — I6350 Cerebral infarction due to unspecified occlusion or stenosis of unspecified cerebral artery: Secondary | ICD-10-CM | POA: Diagnosis not present

## 2024-01-17 DIAGNOSIS — G309 Alzheimer's disease, unspecified: Secondary | ICD-10-CM | POA: Diagnosis not present

## 2024-01-17 DIAGNOSIS — N139 Obstructive and reflux uropathy, unspecified: Secondary | ICD-10-CM | POA: Diagnosis not present

## 2024-01-17 DIAGNOSIS — E039 Hypothyroidism, unspecified: Secondary | ICD-10-CM | POA: Diagnosis not present

## 2024-01-18 DIAGNOSIS — M79642 Pain in left hand: Secondary | ICD-10-CM | POA: Diagnosis not present

## 2024-01-18 DIAGNOSIS — M6249 Contracture of muscle, multiple sites: Secondary | ICD-10-CM | POA: Diagnosis not present

## 2024-01-18 DIAGNOSIS — R1312 Dysphagia, oropharyngeal phase: Secondary | ICD-10-CM | POA: Diagnosis not present

## 2024-01-19 DIAGNOSIS — R1312 Dysphagia, oropharyngeal phase: Secondary | ICD-10-CM | POA: Diagnosis not present

## 2024-01-19 DIAGNOSIS — M79642 Pain in left hand: Secondary | ICD-10-CM | POA: Diagnosis not present

## 2024-01-19 DIAGNOSIS — M6249 Contracture of muscle, multiple sites: Secondary | ICD-10-CM | POA: Diagnosis not present

## 2024-01-20 DIAGNOSIS — M6249 Contracture of muscle, multiple sites: Secondary | ICD-10-CM | POA: Diagnosis not present

## 2024-01-20 DIAGNOSIS — R1312 Dysphagia, oropharyngeal phase: Secondary | ICD-10-CM | POA: Diagnosis not present

## 2024-01-20 DIAGNOSIS — M79642 Pain in left hand: Secondary | ICD-10-CM | POA: Diagnosis not present

## 2024-01-22 DIAGNOSIS — M6249 Contracture of muscle, multiple sites: Secondary | ICD-10-CM | POA: Diagnosis not present

## 2024-01-22 DIAGNOSIS — R1312 Dysphagia, oropharyngeal phase: Secondary | ICD-10-CM | POA: Diagnosis not present

## 2024-01-22 DIAGNOSIS — M79642 Pain in left hand: Secondary | ICD-10-CM | POA: Diagnosis not present

## 2024-01-23 DIAGNOSIS — M6249 Contracture of muscle, multiple sites: Secondary | ICD-10-CM | POA: Diagnosis not present

## 2024-01-23 DIAGNOSIS — R1312 Dysphagia, oropharyngeal phase: Secondary | ICD-10-CM | POA: Diagnosis not present

## 2024-01-23 DIAGNOSIS — M79642 Pain in left hand: Secondary | ICD-10-CM | POA: Diagnosis not present

## 2024-01-23 DIAGNOSIS — N139 Obstructive and reflux uropathy, unspecified: Secondary | ICD-10-CM | POA: Diagnosis not present

## 2024-01-23 DIAGNOSIS — R339 Retention of urine, unspecified: Secondary | ICD-10-CM | POA: Diagnosis not present

## 2024-01-24 DIAGNOSIS — R1312 Dysphagia, oropharyngeal phase: Secondary | ICD-10-CM | POA: Diagnosis not present

## 2024-01-24 DIAGNOSIS — M79642 Pain in left hand: Secondary | ICD-10-CM | POA: Diagnosis not present

## 2024-01-24 DIAGNOSIS — M6249 Contracture of muscle, multiple sites: Secondary | ICD-10-CM | POA: Diagnosis not present

## 2024-01-25 DIAGNOSIS — M79642 Pain in left hand: Secondary | ICD-10-CM | POA: Diagnosis not present

## 2024-01-25 DIAGNOSIS — M6249 Contracture of muscle, multiple sites: Secondary | ICD-10-CM | POA: Diagnosis not present

## 2024-01-25 DIAGNOSIS — R1312 Dysphagia, oropharyngeal phase: Secondary | ICD-10-CM | POA: Diagnosis not present

## 2024-01-26 DIAGNOSIS — R1312 Dysphagia, oropharyngeal phase: Secondary | ICD-10-CM | POA: Diagnosis not present

## 2024-01-26 DIAGNOSIS — M6249 Contracture of muscle, multiple sites: Secondary | ICD-10-CM | POA: Diagnosis not present

## 2024-01-26 DIAGNOSIS — M79642 Pain in left hand: Secondary | ICD-10-CM | POA: Diagnosis not present

## 2024-01-29 DIAGNOSIS — M6249 Contracture of muscle, multiple sites: Secondary | ICD-10-CM | POA: Diagnosis not present

## 2024-01-29 DIAGNOSIS — R1312 Dysphagia, oropharyngeal phase: Secondary | ICD-10-CM | POA: Diagnosis not present

## 2024-01-29 DIAGNOSIS — M79642 Pain in left hand: Secondary | ICD-10-CM | POA: Diagnosis not present

## 2024-01-30 DIAGNOSIS — M6249 Contracture of muscle, multiple sites: Secondary | ICD-10-CM | POA: Diagnosis not present

## 2024-01-30 DIAGNOSIS — R1312 Dysphagia, oropharyngeal phase: Secondary | ICD-10-CM | POA: Diagnosis not present

## 2024-01-30 DIAGNOSIS — M79642 Pain in left hand: Secondary | ICD-10-CM | POA: Diagnosis not present

## 2024-01-31 DIAGNOSIS — R1312 Dysphagia, oropharyngeal phase: Secondary | ICD-10-CM | POA: Diagnosis not present

## 2024-01-31 DIAGNOSIS — M79642 Pain in left hand: Secondary | ICD-10-CM | POA: Diagnosis not present

## 2024-01-31 DIAGNOSIS — M6249 Contracture of muscle, multiple sites: Secondary | ICD-10-CM | POA: Diagnosis not present

## 2024-02-05 DIAGNOSIS — Z7189 Other specified counseling: Secondary | ICD-10-CM | POA: Diagnosis not present

## 2024-02-05 DIAGNOSIS — M6249 Contracture of muscle, multiple sites: Secondary | ICD-10-CM | POA: Diagnosis not present

## 2024-02-05 DIAGNOSIS — Z79899 Other long term (current) drug therapy: Secondary | ICD-10-CM | POA: Diagnosis not present

## 2024-02-05 DIAGNOSIS — M79642 Pain in left hand: Secondary | ICD-10-CM | POA: Diagnosis not present

## 2024-02-05 DIAGNOSIS — R1312 Dysphagia, oropharyngeal phase: Secondary | ICD-10-CM | POA: Diagnosis not present

## 2024-02-05 DIAGNOSIS — E785 Hyperlipidemia, unspecified: Secondary | ICD-10-CM | POA: Diagnosis not present

## 2024-02-05 DIAGNOSIS — G309 Alzheimer's disease, unspecified: Secondary | ICD-10-CM | POA: Diagnosis not present

## 2024-02-05 DIAGNOSIS — E039 Hypothyroidism, unspecified: Secondary | ICD-10-CM | POA: Diagnosis not present

## 2024-02-12 DIAGNOSIS — R1312 Dysphagia, oropharyngeal phase: Secondary | ICD-10-CM | POA: Diagnosis not present

## 2024-02-12 DIAGNOSIS — M6249 Contracture of muscle, multiple sites: Secondary | ICD-10-CM | POA: Diagnosis not present

## 2024-02-12 DIAGNOSIS — M79642 Pain in left hand: Secondary | ICD-10-CM | POA: Diagnosis not present

## 2024-02-13 DIAGNOSIS — M79642 Pain in left hand: Secondary | ICD-10-CM | POA: Diagnosis not present

## 2024-02-13 DIAGNOSIS — R1312 Dysphagia, oropharyngeal phase: Secondary | ICD-10-CM | POA: Diagnosis not present

## 2024-02-13 DIAGNOSIS — M6249 Contracture of muscle, multiple sites: Secondary | ICD-10-CM | POA: Diagnosis not present

## 2024-02-15 DIAGNOSIS — Z79899 Other long term (current) drug therapy: Secondary | ICD-10-CM | POA: Diagnosis not present

## 2024-02-23 DIAGNOSIS — N139 Obstructive and reflux uropathy, unspecified: Secondary | ICD-10-CM | POA: Diagnosis not present

## 2024-02-23 DIAGNOSIS — R339 Retention of urine, unspecified: Secondary | ICD-10-CM | POA: Diagnosis not present

## 2024-03-05 DIAGNOSIS — N3281 Overactive bladder: Secondary | ICD-10-CM | POA: Diagnosis not present

## 2024-03-05 DIAGNOSIS — E785 Hyperlipidemia, unspecified: Secondary | ICD-10-CM | POA: Diagnosis not present

## 2024-03-05 DIAGNOSIS — Z79899 Other long term (current) drug therapy: Secondary | ICD-10-CM | POA: Diagnosis not present

## 2024-03-05 DIAGNOSIS — G309 Alzheimer's disease, unspecified: Secondary | ICD-10-CM | POA: Diagnosis not present

## 2024-03-06 DIAGNOSIS — N390 Urinary tract infection, site not specified: Secondary | ICD-10-CM | POA: Diagnosis not present

## 2024-03-11 DIAGNOSIS — Z79899 Other long term (current) drug therapy: Secondary | ICD-10-CM | POA: Diagnosis not present

## 2024-03-11 DIAGNOSIS — N390 Urinary tract infection, site not specified: Secondary | ICD-10-CM | POA: Diagnosis not present

## 2024-03-25 DIAGNOSIS — R339 Retention of urine, unspecified: Secondary | ICD-10-CM | POA: Diagnosis not present

## 2024-03-25 DIAGNOSIS — N139 Obstructive and reflux uropathy, unspecified: Secondary | ICD-10-CM | POA: Diagnosis not present

## 2024-03-26 DIAGNOSIS — M6281 Muscle weakness (generalized): Secondary | ICD-10-CM | POA: Diagnosis not present

## 2024-03-26 DIAGNOSIS — R1312 Dysphagia, oropharyngeal phase: Secondary | ICD-10-CM | POA: Diagnosis not present

## 2024-03-26 DIAGNOSIS — R293 Abnormal posture: Secondary | ICD-10-CM | POA: Diagnosis not present

## 2024-03-27 DIAGNOSIS — N139 Obstructive and reflux uropathy, unspecified: Secondary | ICD-10-CM | POA: Diagnosis not present

## 2024-03-27 DIAGNOSIS — E119 Type 2 diabetes mellitus without complications: Secondary | ICD-10-CM | POA: Diagnosis not present

## 2024-03-27 DIAGNOSIS — E785 Hyperlipidemia, unspecified: Secondary | ICD-10-CM | POA: Diagnosis not present

## 2024-03-27 DIAGNOSIS — E039 Hypothyroidism, unspecified: Secondary | ICD-10-CM | POA: Diagnosis not present

## 2024-03-27 DIAGNOSIS — G919 Hydrocephalus, unspecified: Secondary | ICD-10-CM | POA: Diagnosis not present

## 2024-03-27 DIAGNOSIS — Z79899 Other long term (current) drug therapy: Secondary | ICD-10-CM | POA: Diagnosis not present

## 2024-03-27 DIAGNOSIS — Z978 Presence of other specified devices: Secondary | ICD-10-CM | POA: Diagnosis not present

## 2024-03-27 DIAGNOSIS — I6350 Cerebral infarction due to unspecified occlusion or stenosis of unspecified cerebral artery: Secondary | ICD-10-CM | POA: Diagnosis not present

## 2024-03-28 DIAGNOSIS — M6281 Muscle weakness (generalized): Secondary | ICD-10-CM | POA: Diagnosis not present

## 2024-03-28 DIAGNOSIS — R293 Abnormal posture: Secondary | ICD-10-CM | POA: Diagnosis not present

## 2024-03-28 DIAGNOSIS — R1312 Dysphagia, oropharyngeal phase: Secondary | ICD-10-CM | POA: Diagnosis not present

## 2024-03-29 DIAGNOSIS — R293 Abnormal posture: Secondary | ICD-10-CM | POA: Diagnosis not present

## 2024-03-29 DIAGNOSIS — R1312 Dysphagia, oropharyngeal phase: Secondary | ICD-10-CM | POA: Diagnosis not present

## 2024-03-29 DIAGNOSIS — M6281 Muscle weakness (generalized): Secondary | ICD-10-CM | POA: Diagnosis not present

## 2024-04-01 DIAGNOSIS — R1312 Dysphagia, oropharyngeal phase: Secondary | ICD-10-CM | POA: Diagnosis not present

## 2024-04-01 DIAGNOSIS — M6281 Muscle weakness (generalized): Secondary | ICD-10-CM | POA: Diagnosis not present

## 2024-04-01 DIAGNOSIS — R293 Abnormal posture: Secondary | ICD-10-CM | POA: Diagnosis not present

## 2024-04-02 DIAGNOSIS — R293 Abnormal posture: Secondary | ICD-10-CM | POA: Diagnosis not present

## 2024-04-02 DIAGNOSIS — R1312 Dysphagia, oropharyngeal phase: Secondary | ICD-10-CM | POA: Diagnosis not present

## 2024-04-02 DIAGNOSIS — M6281 Muscle weakness (generalized): Secondary | ICD-10-CM | POA: Diagnosis not present

## 2024-04-03 DIAGNOSIS — R293 Abnormal posture: Secondary | ICD-10-CM | POA: Diagnosis not present

## 2024-04-03 DIAGNOSIS — M6281 Muscle weakness (generalized): Secondary | ICD-10-CM | POA: Diagnosis not present

## 2024-04-03 DIAGNOSIS — R1312 Dysphagia, oropharyngeal phase: Secondary | ICD-10-CM | POA: Diagnosis not present

## 2024-04-05 DIAGNOSIS — R1312 Dysphagia, oropharyngeal phase: Secondary | ICD-10-CM | POA: Diagnosis not present

## 2024-04-05 DIAGNOSIS — M6281 Muscle weakness (generalized): Secondary | ICD-10-CM | POA: Diagnosis not present

## 2024-04-05 DIAGNOSIS — R293 Abnormal posture: Secondary | ICD-10-CM | POA: Diagnosis not present

## 2024-04-08 DIAGNOSIS — R1312 Dysphagia, oropharyngeal phase: Secondary | ICD-10-CM | POA: Diagnosis not present

## 2024-04-08 DIAGNOSIS — M6281 Muscle weakness (generalized): Secondary | ICD-10-CM | POA: Diagnosis not present

## 2024-04-08 DIAGNOSIS — R293 Abnormal posture: Secondary | ICD-10-CM | POA: Diagnosis not present

## 2024-04-09 DIAGNOSIS — R1312 Dysphagia, oropharyngeal phase: Secondary | ICD-10-CM | POA: Diagnosis not present

## 2024-04-09 DIAGNOSIS — R293 Abnormal posture: Secondary | ICD-10-CM | POA: Diagnosis not present

## 2024-04-09 DIAGNOSIS — M6281 Muscle weakness (generalized): Secondary | ICD-10-CM | POA: Diagnosis not present

## 2024-04-10 DIAGNOSIS — R1312 Dysphagia, oropharyngeal phase: Secondary | ICD-10-CM | POA: Diagnosis not present

## 2024-04-10 DIAGNOSIS — M6281 Muscle weakness (generalized): Secondary | ICD-10-CM | POA: Diagnosis not present

## 2024-04-10 DIAGNOSIS — R293 Abnormal posture: Secondary | ICD-10-CM | POA: Diagnosis not present

## 2024-04-12 DIAGNOSIS — R293 Abnormal posture: Secondary | ICD-10-CM | POA: Diagnosis not present

## 2024-04-12 DIAGNOSIS — M6281 Muscle weakness (generalized): Secondary | ICD-10-CM | POA: Diagnosis not present

## 2024-04-12 DIAGNOSIS — R1312 Dysphagia, oropharyngeal phase: Secondary | ICD-10-CM | POA: Diagnosis not present

## 2024-04-16 DIAGNOSIS — M6281 Muscle weakness (generalized): Secondary | ICD-10-CM | POA: Diagnosis not present

## 2024-04-16 DIAGNOSIS — R1312 Dysphagia, oropharyngeal phase: Secondary | ICD-10-CM | POA: Diagnosis not present

## 2024-04-16 DIAGNOSIS — R293 Abnormal posture: Secondary | ICD-10-CM | POA: Diagnosis not present

## 2024-04-18 DIAGNOSIS — M6281 Muscle weakness (generalized): Secondary | ICD-10-CM | POA: Diagnosis not present

## 2024-04-18 DIAGNOSIS — R1312 Dysphagia, oropharyngeal phase: Secondary | ICD-10-CM | POA: Diagnosis not present

## 2024-04-18 DIAGNOSIS — R293 Abnormal posture: Secondary | ICD-10-CM | POA: Diagnosis not present

## 2024-04-23 DIAGNOSIS — R293 Abnormal posture: Secondary | ICD-10-CM | POA: Diagnosis not present

## 2024-04-23 DIAGNOSIS — R1312 Dysphagia, oropharyngeal phase: Secondary | ICD-10-CM | POA: Diagnosis not present

## 2024-04-23 DIAGNOSIS — M6281 Muscle weakness (generalized): Secondary | ICD-10-CM | POA: Diagnosis not present

## 2024-05-17 ENCOUNTER — Ambulatory Visit: Admitting: Urology

## 2024-05-17 ENCOUNTER — Encounter: Payer: Self-pay | Admitting: Urology

## 2024-05-17 VITALS — BP 124/78 | HR 84

## 2024-05-17 DIAGNOSIS — R339 Retention of urine, unspecified: Secondary | ICD-10-CM | POA: Diagnosis not present

## 2024-05-17 NOTE — Patient Instructions (Signed)
Indwelling Urinary Catheter Bag Care, Adult  An indwelling urinary catheter is a soft tube that is placed into the bladder to help drain pee (urine) out of the body. The catheter is put in through the urethra and is held inside your bladder by a balloon filled with water. The urethra is the part of the body that drains pee from the bladder. Pee drains from the catheter into a drainage bag outside of the body. Taking good care of your catheter, catheter tubing, and the drainage bag will keep your catheter working well. It will also help prevent problems like urinary tract infections (UTIs). What are the risks? Germs (bacteria) may get into your bladder and cause a UTI. The flow of pee can become blocked. This can happen if: The catheter is not connected to the drainage bag correctly. You have sediment or a blood clot in your bladder or catheter. How to wear the catheter and drainage bag Supplies needed: Adhesive tape or a leg strap. If you use tape you will need: Alcohol wipes or soap and water. A clean towel. Overnight drainage bag. Smaller drainage bag (leg bag). Wearing your catheter and bag Use tape or a leg strap to attach the catheter and tubing to your leg. Make sure the catheter is not pulled tight. If a leg strap gets wet, replace it with a dry one. If you use tape: Use an alcohol wipe or soap and water to wash off any stickiness on your skin where you had tape before. Use a clean towel to pat-dry the area. Apply the new tape. You should have received a large overnight drainage bag and a smaller leg bag that fits under clothing. You may wear the overnight bag at any time, but you should not wear the leg bag at night. Make sure the overnight drainage bag is always lower than the level of your bladder. But do not let the bag touch the floor. Before you go to sleep, hang the bag on the side of a wastebasket that is covered by a clean plastic bag. Secure the leg bag according to the  manufacturer's instructions. This may be above or below the knee, depending on the length of the tubing. Make sure that: The leg bag is below your bladder. The tubing does not have loops or too much tension.  How to empty the drainage bag Supplies needed: Rubbing alcohol or alcohol wipes. Gauze pad or cotton ball. Toilet or a clean container. Emptying the bag Empty your drainage bag when it is ?- full, or at least 2-3 times a day. Clean the bag according to the manufacturer's instructions or as told by your health care provider. Wash your hands with soap and water for at least 20 seconds. If soap and water are not available, use hand sanitizer. Hold the drainage bag over the toilet or the clean container used with the drainage bag. Make sure the drainage bag is lower than your hips and bladder. This stops pee from going back into the tubing and into your bladder. Open the pour spout at the bottom of the bag. Empty the pee into the toilet or container. Do not let the pour spout touch any surface. This prevents germs from getting in the bag and causing infection. Use the gauze pad or cotton ball soaked with rubbing alcohol or an alcohol wipe to clean the pour spout. Close the pour spout. Wash your hands again with soap and water for at least 20 seconds. How to change the  drainage bag Supplies needed: Alcohol wipes. A new drainage bag. Adhesive tape or a leg strap. Changing the bag Change your drainage bag twice a day. Also replace your drainage bag with a new bag if it leaks, starts to smell bad, or looks dirty. Be sure to empty your drainage bag before you change bags. Wash your hands with soap and water for at least 20 seconds. If soap and water are not available, use hand sanitizer. Detach the tubing or drainage bag from your skin. Pinch the catheter with your fingers so that pee does not spill out. Disconnect the catheter from the drainage bag tubing at the connection valve. Do not let  the end of the catheter touch any surface. Use an alcohol wipe to clean the end of the catheter and the end of the drainage bag tubing being connected. Connect the catheter to the drainage bag tubing. Attach the bag and tubing to your leg with tape or a leg strap. Avoid attaching the bag too tightly. Wash your hands again with soap and water for at least 20 seconds. General instructions  Always wash your hands for at least 20 seconds before and after you handle your catheter or drainage bag. Use a mild, fragrance-free soap. Never pull on your catheter or try to remove it. Pulling can damage your internal tissues. Always make sure there are no leaks around the catheter or from the drainage bag and tubing. Always make sure there are no twists, bends, or kinks in the catheter or the tubing. Drink enough fluid to keep your pee pale yellow. Do not take baths, swim, or use a hot tub. If you are female, wipe from front to back after having a bowel movement. Contact a health care provider if: Your catheter starts to leak. Your bladder feels full. You have tiredness (lethargy), excessive sleepiness, or confusion. You have signs of a UTI, such as: A fever or chills. Pain in your abdomen, legs, lower back, or bladder. Get help right away if: Your pee is pink or red or you see blood in the catheter. Your pee is not draining into the bag. Your catheter gets pulled out. This information is not intended to replace advice given to you by your health care provider. Make sure you discuss any questions you have with your health care provider. Document Revised: 08/02/2022 Document Reviewed: 04/29/2022 Elsevier Patient Education  2024 [employer].

## 2024-05-17 NOTE — Progress Notes (Signed)
 05/17/2024 11:10 AM   Rollene LITTIE Rhymes 1946/01/09 [id number]  Referring provider: Wilhelmina Cedar [address] [place],  [place] [address]  Followup urinary retention   HPI: [name] is a 78yo here for followup for urinary retention. She is managed with an indwelling foley changed monthly. No UTis since last visit. No isseus with foley changes. No pelvic pain. No issues with sediment blocking the foley.    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 If you have any questions, ask your nurse or doctor.          acetaminophen  650 MG CR tablet Commonly known as: TYLENOL  Take 650 mg by mouth every 8 (eight) hours as needed for pain.   atorvastatin  40 MG tablet Commonly known as: LIPITOR TAKE (1) TABLET BY MOUTH EVERY EVENING.   calcium  carbonate 1500 (600 Ca) MG Tabs tablet Commonly known as: OSCAL Take 600 mg of elemental calcium  by mouth daily with breakfast.    Calmoseptine 0.44-20.6 % Oint Generic drug: Menthol-Zinc  Oxide Apply 1 Application topically in the morning, at noon, and at bedtime. Apply to buttocks and groin + every 4 hours prn for moisture associated skin damage   donepezil  10 MG tablet Commonly known as: ARICEPT  TAKE (1) TABLET BY MOUTH EVERY MORNING. What changed: See the new instructions.   lactose free nutrition Liqd Take 240 mLs by mouth 3 (three) times daily between meals.   levETIRAcetam  750 MG tablet Commonly known as: KEPPRA  Take 1 tablet (750 mg total) by mouth 2 (two) times daily.   levothyroxine  50 MCG tablet Commonly known as: SYNTHROID  Take 50 mcg by mouth daily.   loperamide  2 MG tablet Commonly known as: IMODIUM  A-D Take 2-4 mg by mouth as needed for diarrhea or loose stools. Do not exceed 4 tablets in 24 hour period. If diarrhea persists longer than 24 hours contact MD   magnesium  oxide 400 (240 Mg) MG tablet Commonly known as: MAG-OX Take 400 mg by mouth daily.   melatonin 5 MG Tabs Take 5 mg by mouth at bedtime.   mirtazapine  15 MG tablet Commonly known as: REMERON  Take 15 mg by mouth at bedtime.   [name]  50 MG Tb24 tablet Generic drug: mirabegron  ER TAKE (1) TABLET BY MOUTH AT BEDTIME. What changed: See the new instructions.   potassium chloride  SA 20 MEQ tablet Commonly known as: KLOR-CON  M  Take 20 mEq by mouth daily.   Pro-Stat Liqd Take 30 mLs by mouth in the morning and at bedtime.   sertraline  50 MG tablet Commonly known as: ZOLOFT  Take 50 mg by mouth daily.   [name]  8 MG Tb24 tablet Generic drug: fesoterodine  TAKE (1) TABLET BY MOUTH ONCE DAILY.   Vitamin D  50 MCG (2000 UT) Caps Take 1 capsule (2,000 Units total) by mouth daily.        Allergies: No Known Allergies  Family History: Family History  Problem Relation Age of Onset   Stroke Mother 14   Pneumonia Mother    CAD Father 77   Alzheimer's disease Father    Pneumonia Father    Cancer Maternal Aunt         lung (non-smoker)   Diabetes Neg Hx     Social History:  reports that she has never smoked. She has never used smokeless tobacco. She reports that she does not drink alcohol and does not use drugs.  ROS: All other review of systems were reviewed and are negative except what is noted above in HPI  Physical Exam: BP 124/78   Pulse 84   Constitutional:  Alert and oriented, No acute distress. HEENT: [address] AT, moist mucus membranes.  Trachea midline, no masses. Cardiovascular: No clubbing, cyanosis, or edema. Respiratory: Normal respiratory effort, no increased work of breathing. GI: Abdomen is soft, nontender, nondistended, no abdominal masses GU: No CVA tenderness.  Lymph: No cervical or inguinal lymphadenopathy. Skin: No rashes, bruises or suspicious lesions. Neurologic: Grossly intact, no focal deficits, moving all 4 extremities. Psychiatric: Normal mood and affect.  Laboratory Data: Lab Results  Component Value Date   WBC 6.7 10/24/2023   HGB 9.4 (L) 10/24/2023   HCT 30.4 (L) 10/24/2023   MCV 86.4 10/24/2023   PLT 454 (H) 10/24/2023    Lab Results  Component Value Date   CREATININE 0.61 10/24/2023    No results found for: PSA  No results found for: TESTOSTERONE   Lab Results  Component Value Date   HGBA1C 5.6 02/15/2023    Urinalysis    Component Value Date/Time   COLORURINE AMBER (A) 10/20/2023 1137   APPEARANCEUR CLOUDY (A) 10/20/2023 1137   LABSPEC 1.013 10/20/2023 1137   PHURINE 7.0 10/20/2023 1137   GLUCOSEU NEGATIVE 10/20/2023 1137   HGBUR MODERATE (A) 10/20/2023 1137   BILIRUBINUR NEGATIVE 10/20/2023 1137   BILIRUBINUR neg 03/07/2018 1020   KETONESUR NEGATIVE 10/20/2023 1137   PROTEINUR 100 (A) 10/20/2023 1137   UROBILINOGEN 0.2 03/07/2018 1020   UROBILINOGEN 1.0 08/29/2014 1206   NITRITE NEGATIVE 10/20/2023 1137   LEUKOCYTESUR MODERATE (A) 10/20/2023 1137    Lab Results  Component Value Date   BACTERIA RARE (A) 10/20/2023    Pertinent  Imaging:  No results found for this or any previous visit.  No results found for this or any previous visit.  No results found for this or any previous visit.  No results found for this or any previous visit.  Results for orders placed during the hospital encounter of 10/20/23  US  RENAL  Narrative CLINICAL DATA:  6300 Hydronephrosis 6300  EXAM: RENAL / URINARY TRACT ULTRASOUND COMPLETE  COMPARISON:  October 20, 2023  FINDINGS: Right Kidney:  Renal measurements: 10.8 x 4.0 x 5.0 cm = volume: 113 mL. Echogenicity within normal limits. No mass or hydronephrosis visualized.  Left Kidney:  Renal measurements: 10.2 x 5.2 x 5.1 cm = volume: 146 mL. Echogenicity within normal limits. No mass or  hydronephrosis visualized.  Bladder:  Decompressed around a Foley catheter  Other:  None.  IMPRESSION: Resolution of hydronephrosis status post bladder decompression.   Electronically Signed By: Corean Salter M.D. On: 10/22/2023 15:00  No results found for this or any previous visit.  No results found for this or any previous visit.  Results for orders placed during the hospital encounter of 10/20/23  CT Renal Stone Study  Narrative CLINICAL DATA:  gross hematuria.  EXAM: CT ABDOMEN AND PELVIS WITHOUT CONTRAST  TECHNIQUE: Multidetector CT imaging of the abdomen and pelvis was performed following the standard protocol without IV contrast.  RADIATION DOSE REDUCTION: This exam was performed according to the departmental dose-optimization program which includes automated exposure control, adjustment of the mA and/or kV according to patient size and/or use of iterative reconstruction technique.  COMPARISON:  CT scan abdomen and pelvis from 05/17/2019.  FINDINGS: Lower chest: There are atelectatic changes at the lung bases. No dense consolidation or lung mass. No pleural effusion. The heart is normal in size. No pericardial effusion.  Hepatobiliary: The  liver is normal in size. Non-cirrhotic configuration. No suspicious mass. No intrahepatic or extrahepatic bile duct dilation. Gallbladder is surgically absent.  Pancreas: Unremarkable. No pancreatic ductal dilatation or surrounding inflammatory changes.  Spleen: Within normal limits. No focal lesion.  Adrenals/Urinary Tract: Adrenal glands are unremarkable. No suspicious renal mass within the limitations of this unenhanced exam. Bilateral extrarenal pelves noted. There is bilateral mild-to-moderate hydronephrosis and hydroureter. No nephroureterolithiasis on either side. Markedly distended urinary bladder. No focal mass or bladder calculi.  Stomach/Bowel: There is a small-to-moderate sliding hiatal hernia. There is a small diverticulum arising from the third part of duodenum no disproportionate dilation of the small or large bowel loops. The appendix was not visualized; however there is no acute inflammatory process in the right lower quadrant. The rectum is dilated measuring up to 9 cm in diameter and exhibit mild circumferential wall thickening. There is mild perirectal fat stranding. In appropriate clinical settings findings favor proctitis.  Vascular/Lymphatic: No ascites or pneumoperitoneum. No abdominal or pelvic lymphadenopathy, by size criteria. No aneurysmal dilation of the major abdominal arteries. There are moderate peripheral atherosclerotic vascular calcifications of the aorta and its major branches.  Reproductive: The uterus is unremarkable. No large adnexal mass.  Other: There is a tiny fat containing umbilical hernia. Mild anasarca. The soft tissues and abdominal wall are otherwise unremarkable.  Musculoskeletal: No suspicious osseous lesions. There are mild - moderate multilevel degenerative changes in the visualized spine.  IMPRESSION: 1. Markedly distended urinary bladder with resultant mild-to-moderate bilateral hydronephrosis and hydroureter.  No nephroureterolithiasis on either side. 2. Dilated rectum with mild circumferential wall thickening and perirectal fat stranding. In appropriate clinical settings findings favor proctitis. 3. Multiple other nonacute observations, as described above.  Aortic Atherosclerosis (ICD10-I70.0).   Electronically Signed By: Ree Molt M.D. On: 10/20/2023 14:56   Assessment & Plan:    1. Urinary retention (Primary) Continue monthly foley changes. Followup 1 year   No follow-ups on file.  Belvie Clara, MD  [facility] 

## 2025-05-14 ENCOUNTER — Ambulatory Visit: Admitting: Urology
